# Patient Record
Sex: Female | Born: 1955 | Race: Black or African American | Hispanic: No | Marital: Single | Smoking: Never smoker
Health system: Southern US, Community
[De-identification: ages and names within clinical notes are randomized; demographics above are authoritative.]

## PROBLEM LIST (undated history)

## (undated) DIAGNOSIS — Z992 Dependence on renal dialysis: Secondary | ICD-10-CM

## (undated) DIAGNOSIS — I5022 Chronic systolic (congestive) heart failure: Secondary | ICD-10-CM

## (undated) DIAGNOSIS — I471 Supraventricular tachycardia, unspecified: Secondary | ICD-10-CM

## (undated) DIAGNOSIS — I2510 Atherosclerotic heart disease of native coronary artery without angina pectoris: Secondary | ICD-10-CM

## (undated) DIAGNOSIS — J449 Chronic obstructive pulmonary disease, unspecified: Secondary | ICD-10-CM

## (undated) DIAGNOSIS — N186 End stage renal disease: Secondary | ICD-10-CM

## (undated) DIAGNOSIS — H9190 Unspecified hearing loss, unspecified ear: Secondary | ICD-10-CM

## (undated) DIAGNOSIS — I340 Nonrheumatic mitral (valve) insufficiency: Secondary | ICD-10-CM

## (undated) DIAGNOSIS — D638 Anemia in other chronic diseases classified elsewhere: Secondary | ICD-10-CM

## (undated) DIAGNOSIS — I10 Essential (primary) hypertension: Secondary | ICD-10-CM

## (undated) DIAGNOSIS — N2581 Secondary hyperparathyroidism of renal origin: Secondary | ICD-10-CM

## (undated) DIAGNOSIS — B1920 Unspecified viral hepatitis C without hepatic coma: Secondary | ICD-10-CM

## (undated) DIAGNOSIS — R4189 Other symptoms and signs involving cognitive functions and awareness: Secondary | ICD-10-CM

## (undated) DIAGNOSIS — G43109 Migraine with aura, not intractable, without status migrainosus: Secondary | ICD-10-CM

## (undated) DIAGNOSIS — K219 Gastro-esophageal reflux disease without esophagitis: Secondary | ICD-10-CM

## (undated) HISTORY — PX: TUBAL LIGATION: SHX77

## (undated) HISTORY — PX: DIALYSIS FISTULA CREATION: SHX611

## (undated) SURGERY — A/V SHUNT INTERVENTION
Anesthesia: Moderate Sedation | Laterality: Right

---

## 2004-06-07 ENCOUNTER — Other Ambulatory Visit: Payer: Self-pay

## 2004-06-07 ENCOUNTER — Emergency Department: Payer: Self-pay | Admitting: General Practice

## 2004-12-08 ENCOUNTER — Emergency Department: Payer: Self-pay | Admitting: Emergency Medicine

## 2007-03-05 ENCOUNTER — Emergency Department: Payer: Self-pay | Admitting: Emergency Medicine

## 2007-03-05 ENCOUNTER — Other Ambulatory Visit: Payer: Self-pay

## 2007-08-06 ENCOUNTER — Inpatient Hospital Stay: Payer: Self-pay | Admitting: *Deleted

## 2007-08-06 ENCOUNTER — Other Ambulatory Visit: Payer: Self-pay

## 2007-11-15 ENCOUNTER — Ambulatory Visit: Payer: Self-pay | Admitting: Vascular Surgery

## 2007-11-22 ENCOUNTER — Ambulatory Visit: Payer: Self-pay | Admitting: Vascular Surgery

## 2008-05-26 ENCOUNTER — Ambulatory Visit: Payer: Self-pay | Admitting: Nephrology

## 2008-05-28 ENCOUNTER — Ambulatory Visit: Payer: Self-pay | Admitting: Vascular Surgery

## 2008-06-09 ENCOUNTER — Ambulatory Visit: Payer: Self-pay | Admitting: Nephrology

## 2008-06-10 ENCOUNTER — Inpatient Hospital Stay: Payer: Self-pay | Admitting: Internal Medicine

## 2008-08-03 ENCOUNTER — Ambulatory Visit: Payer: Self-pay | Admitting: Vascular Surgery

## 2008-08-04 ENCOUNTER — Emergency Department: Payer: Self-pay | Admitting: Emergency Medicine

## 2008-08-19 ENCOUNTER — Ambulatory Visit: Payer: Self-pay | Admitting: Internal Medicine

## 2008-08-28 ENCOUNTER — Inpatient Hospital Stay: Payer: Self-pay | Admitting: Internal Medicine

## 2008-09-19 ENCOUNTER — Ambulatory Visit: Payer: Self-pay | Admitting: Internal Medicine

## 2008-10-12 ENCOUNTER — Ambulatory Visit: Payer: Self-pay | Admitting: Vascular Surgery

## 2008-10-15 ENCOUNTER — Ambulatory Visit: Payer: Self-pay | Admitting: Internal Medicine

## 2008-10-19 ENCOUNTER — Ambulatory Visit: Payer: Self-pay | Admitting: Internal Medicine

## 2008-11-10 ENCOUNTER — Inpatient Hospital Stay: Payer: Self-pay | Admitting: Student

## 2008-11-12 ENCOUNTER — Ambulatory Visit: Payer: Self-pay | Admitting: Internal Medicine

## 2008-11-19 ENCOUNTER — Ambulatory Visit: Payer: Self-pay | Admitting: Internal Medicine

## 2008-12-03 ENCOUNTER — Ambulatory Visit: Payer: Self-pay | Admitting: Internal Medicine

## 2008-12-07 ENCOUNTER — Ambulatory Visit: Payer: Self-pay | Admitting: Vascular Surgery

## 2008-12-19 ENCOUNTER — Ambulatory Visit: Payer: Self-pay | Admitting: Internal Medicine

## 2009-01-17 ENCOUNTER — Emergency Department: Payer: Self-pay | Admitting: Emergency Medicine

## 2009-02-19 ENCOUNTER — Ambulatory Visit: Payer: Self-pay | Admitting: Internal Medicine

## 2009-02-21 ENCOUNTER — Ambulatory Visit: Payer: Self-pay | Admitting: Nephrology

## 2009-03-08 ENCOUNTER — Ambulatory Visit: Payer: Self-pay | Admitting: Internal Medicine

## 2009-03-12 ENCOUNTER — Ambulatory Visit: Payer: Self-pay | Admitting: Internal Medicine

## 2009-03-16 ENCOUNTER — Inpatient Hospital Stay: Payer: Self-pay | Admitting: Internal Medicine

## 2009-03-19 ENCOUNTER — Ambulatory Visit: Payer: Self-pay | Admitting: Internal Medicine

## 2009-04-08 ENCOUNTER — Ambulatory Visit: Payer: Self-pay | Admitting: Internal Medicine

## 2009-04-27 ENCOUNTER — Emergency Department: Payer: Self-pay | Admitting: Internal Medicine

## 2009-05-19 ENCOUNTER — Ambulatory Visit: Payer: Self-pay | Admitting: Internal Medicine

## 2009-05-21 ENCOUNTER — Ambulatory Visit: Payer: Self-pay | Admitting: Internal Medicine

## 2009-06-19 ENCOUNTER — Ambulatory Visit: Payer: Self-pay | Admitting: Internal Medicine

## 2009-07-19 ENCOUNTER — Ambulatory Visit: Payer: Self-pay | Admitting: Internal Medicine

## 2009-10-08 ENCOUNTER — Inpatient Hospital Stay: Payer: Self-pay | Admitting: Specialist

## 2010-04-09 ENCOUNTER — Ambulatory Visit: Payer: Self-pay | Admitting: Vascular Surgery

## 2010-08-29 ENCOUNTER — Emergency Department: Payer: Self-pay | Admitting: Unknown Physician Specialty

## 2010-11-04 ENCOUNTER — Ambulatory Visit: Payer: Self-pay | Admitting: Vascular Surgery

## 2011-01-23 ENCOUNTER — Ambulatory Visit: Payer: Self-pay | Admitting: Vascular Surgery

## 2011-01-23 LAB — POTASSIUM: Potassium: 4.3 mmol/L (ref 3.5–5.1)

## 2011-03-02 ENCOUNTER — Ambulatory Visit: Payer: Self-pay | Admitting: Vascular Surgery

## 2011-03-02 LAB — POTASSIUM: Potassium: 4.6 mmol/L (ref 3.5–5.1)

## 2011-04-24 ENCOUNTER — Ambulatory Visit: Payer: Self-pay | Admitting: Vascular Surgery

## 2011-04-27 ENCOUNTER — Ambulatory Visit: Payer: Self-pay | Admitting: Vascular Surgery

## 2011-04-29 ENCOUNTER — Ambulatory Visit: Payer: Self-pay | Admitting: Vascular Surgery

## 2011-04-29 LAB — POTASSIUM: Potassium: 5.6 mmol/L — ABNORMAL HIGH (ref 3.5–5.1)

## 2011-05-01 LAB — PATHOLOGY REPORT

## 2011-07-31 ENCOUNTER — Inpatient Hospital Stay: Payer: Self-pay | Admitting: Internal Medicine

## 2011-07-31 LAB — COMPREHENSIVE METABOLIC PANEL
Alkaline Phosphatase: 160 U/L — ABNORMAL HIGH (ref 50–136)
BUN: 49 mg/dL — ABNORMAL HIGH (ref 7–18)
Bilirubin,Total: 0.5 mg/dL (ref 0.2–1.0)
Co2: 25 mmol/L (ref 21–32)
Creatinine: 7.82 mg/dL — ABNORMAL HIGH (ref 0.60–1.30)
EGFR (Non-African Amer.): 5 — ABNORMAL LOW
Glucose: 81 mg/dL (ref 65–99)
Osmolality: 284 (ref 275–301)
SGOT(AST): 59 U/L — ABNORMAL HIGH (ref 15–37)
SGPT (ALT): 35 U/L
Sodium: 136 mmol/L (ref 136–145)
Total Protein: 8 g/dL (ref 6.4–8.2)

## 2011-07-31 LAB — URINALYSIS, COMPLETE
Bacteria: NONE SEEN
Blood: NEGATIVE
Glucose,UR: NEGATIVE mg/dL (ref 0–75)
Leukocyte Esterase: NEGATIVE
Nitrite: NEGATIVE
Protein: 100
Specific Gravity: 1.011 (ref 1.003–1.030)
Squamous Epithelial: 1
WBC UR: <1 /HPF (ref 0–5)

## 2011-07-31 LAB — CBC
HGB: 10.8 g/dL — ABNORMAL LOW (ref 12.0–16.0)
MCV: 81 fL (ref 80–100)
Platelet: 339 10*3/uL (ref 150–440)
RBC: 4.22 10*6/uL (ref 3.80–5.20)
RDW: 17.9 % — ABNORMAL HIGH (ref 11.5–14.5)
WBC: 20.8 10*3/uL — ABNORMAL HIGH (ref 3.6–11.0)

## 2011-07-31 LAB — CK-MB: CK-MB: <0.5 ng/mL — ABNORMAL LOW (ref 0.5–3.6)

## 2011-08-01 LAB — CK-MB
CK-MB: <0.5 ng/mL — ABNORMAL LOW (ref 0.5–3.6)
CK-MB: <0.5 ng/mL — ABNORMAL LOW (ref 0.5–3.6)

## 2011-08-01 LAB — HEPATIC FUNCTION PANEL A (ARMC)
Albumin: 2.9 g/dL — ABNORMAL LOW (ref 3.4–5.0)
Alkaline Phosphatase: 127 U/L (ref 50–136)
Bilirubin,Total: 0.6 mg/dL (ref 0.2–1.0)
SGOT(AST): 43 U/L — ABNORMAL HIGH (ref 15–37)
SGPT (ALT): 38 U/L
Total Protein: 7.3 g/dL (ref 6.4–8.2)

## 2011-08-01 LAB — BASIC METABOLIC PANEL
Anion Gap: 12 (ref 7–16)
BUN: 55 mg/dL — ABNORMAL HIGH (ref 7–18)
Chloride: 98 mmol/L (ref 98–107)
Co2: 28 mmol/L (ref 21–32)
Creatinine: 9.51 mg/dL — ABNORMAL HIGH (ref 0.60–1.30)
EGFR (African American): 5 — ABNORMAL LOW
EGFR (Non-African Amer.): 4 — ABNORMAL LOW
Glucose: 80 mg/dL (ref 65–99)

## 2011-08-01 LAB — CBC WITH DIFFERENTIAL/PLATELET
Basophil %: 0.4 %
Eosinophil %: 0.1 %
HCT: 32.8 % — ABNORMAL LOW (ref 35.0–47.0)
HGB: 10.6 g/dL — ABNORMAL LOW (ref 12.0–16.0)
Lymphocyte %: 3.9 %
MCHC: 32.3 g/dL (ref 32.0–36.0)
Monocyte #: 1.5 x10 3/mm — ABNORMAL HIGH (ref 0.2–0.9)
Monocyte %: 7.7 %
Neutrophil #: 17.5 10*3/uL — ABNORMAL HIGH (ref 1.4–6.5)
Neutrophil %: 87.9 %
RBC: 4.06 10*6/uL (ref 3.80–5.20)
RDW: 18 % — ABNORMAL HIGH (ref 11.5–14.5)
WBC: 19.9 10*3/uL — ABNORMAL HIGH (ref 3.6–11.0)

## 2011-08-01 LAB — PHOSPHORUS: Phosphorus: 4.1 mg/dL (ref 2.5–4.9)

## 2011-08-01 LAB — TROPONIN I: Troponin-I: <0.02 ng/mL

## 2011-08-02 LAB — URINE CULTURE

## 2011-08-02 LAB — CBC WITH DIFFERENTIAL/PLATELET
Eosinophil #: 0.3 10*3/uL (ref 0.0–0.7)
Eosinophil %: 4.1 %
Lymphocyte %: 12.4 %
MCV: 81 fL (ref 80–100)
Monocyte #: 1.3 x10 3/mm — ABNORMAL HIGH (ref 0.2–0.9)
Monocyte %: 15.8 %
Neutrophil %: 67 %
Platelet: 253 10*3/uL (ref 150–440)
RBC: 3.74 10*6/uL — ABNORMAL LOW (ref 3.80–5.20)
RDW: 18 % — ABNORMAL HIGH (ref 11.5–14.5)
WBC: 8.1 10*3/uL (ref 3.6–11.0)

## 2011-08-04 LAB — WOUND AEROBIC CULTURE

## 2011-08-05 LAB — RENAL FUNCTION PANEL
Albumin: 2.8 g/dL — ABNORMAL LOW (ref 3.4–5.0)
BUN: 65 mg/dL — ABNORMAL HIGH (ref 7–18)
Calcium, Total: 8.1 mg/dL — ABNORMAL LOW (ref 8.5–10.1)
Chloride: 98 mmol/L (ref 98–107)
EGFR (Non-African Amer.): 3 — ABNORMAL LOW
Potassium: 5.6 mmol/L — ABNORMAL HIGH (ref 3.5–5.1)

## 2011-08-06 LAB — PHOSPHORUS: Phosphorus: 5.3 mg/dL — ABNORMAL HIGH (ref 2.5–4.9)

## 2011-08-15 LAB — CULTURE, BLOOD (SINGLE)

## 2011-08-24 ENCOUNTER — Ambulatory Visit: Payer: Self-pay | Admitting: Vascular Surgery

## 2011-08-24 LAB — CBC
HGB: 10.6 g/dL — ABNORMAL LOW (ref 12.0–16.0)
MCH: 27.8 pg (ref 26.0–34.0)
MCHC: 33 g/dL (ref 32.0–36.0)
RBC: 3.82 10*6/uL (ref 3.80–5.20)
RDW: 21 % — ABNORMAL HIGH (ref 11.5–14.5)
WBC: 7.5 10*3/uL (ref 3.6–11.0)

## 2011-08-24 LAB — BASIC METABOLIC PANEL
Anion Gap: 15 (ref 7–16)
Calcium, Total: 8.4 mg/dL — ABNORMAL LOW (ref 8.5–10.1)
Chloride: 102 mmol/L (ref 98–107)
Co2: 24 mmol/L (ref 21–32)
Creatinine: 10.81 mg/dL — ABNORMAL HIGH (ref 0.60–1.30)
Potassium: 4.3 mmol/L (ref 3.5–5.1)
Sodium: 141 mmol/L (ref 136–145)

## 2011-09-02 ENCOUNTER — Ambulatory Visit: Payer: Self-pay | Admitting: Vascular Surgery

## 2011-09-04 LAB — PATHOLOGY REPORT

## 2011-10-02 ENCOUNTER — Ambulatory Visit: Payer: Self-pay | Admitting: Vascular Surgery

## 2011-10-24 ENCOUNTER — Emergency Department: Payer: Self-pay | Admitting: Emergency Medicine

## 2011-10-24 LAB — CBC WITH DIFFERENTIAL/PLATELET
Basophil %: 1 %
Eosinophil #: 0.4 10*3/uL (ref 0.0–0.7)
Eosinophil %: 5 %
HCT: 32.5 % — ABNORMAL LOW (ref 35.0–47.0)
HGB: 10.9 g/dL — ABNORMAL LOW (ref 12.0–16.0)
Lymphocyte #: 1 10*3/uL (ref 1.0–3.6)
MCH: 27.6 pg (ref 26.0–34.0)
MCHC: 33.4 g/dL (ref 32.0–36.0)
MCV: 83 fL (ref 80–100)
Monocyte #: 0.5 x10 3/mm (ref 0.2–0.9)
Monocyte %: 6.8 %
Neutrophil #: 5.6 10*3/uL (ref 1.4–6.5)
Neutrophil %: 73.6 %
Platelet: 429 10*3/uL (ref 150–440)
RBC: 3.93 10*6/uL (ref 3.80–5.20)

## 2011-10-24 LAB — BASIC METABOLIC PANEL
Anion Gap: 11 (ref 7–16)
BUN: 27 mg/dL — ABNORMAL HIGH (ref 7–18)
Chloride: 97 mmol/L — ABNORMAL LOW (ref 98–107)
Creatinine: 7.63 mg/dL — ABNORMAL HIGH (ref 0.60–1.30)
Glucose: 107 mg/dL — ABNORMAL HIGH (ref 65–99)
Osmolality: 276 (ref 275–301)

## 2011-11-04 ENCOUNTER — Emergency Department: Payer: Self-pay | Admitting: Emergency Medicine

## 2011-11-04 ENCOUNTER — Ambulatory Visit: Payer: Self-pay | Admitting: Physician Assistant

## 2011-11-04 LAB — CBC WITH DIFFERENTIAL/PLATELET
Eosinophil #: 0.4 10*3/uL (ref 0.0–0.7)
Eosinophil %: 3.4 %
Lymphocyte #: 1.1 10*3/uL (ref 1.0–3.6)
Lymphocyte %: 10.2 %
MCHC: 32.3 g/dL (ref 32.0–36.0)
MCV: 84 fL (ref 80–100)
Monocyte %: 9.5 %
Platelet: 341 10*3/uL (ref 150–440)
RBC: 4.22 10*6/uL (ref 3.80–5.20)
RDW: 19.4 % — ABNORMAL HIGH (ref 11.5–14.5)
WBC: 11.1 10*3/uL — ABNORMAL HIGH (ref 3.6–11.0)

## 2011-11-04 LAB — BASIC METABOLIC PANEL
Anion Gap: 12 (ref 7–16)
Calcium, Total: 7.6 mg/dL — ABNORMAL LOW (ref 8.5–10.1)
Co2: 25 mmol/L (ref 21–32)
Creatinine: 10.61 mg/dL — ABNORMAL HIGH (ref 0.60–1.30)
EGFR (African American): 4 — ABNORMAL LOW
EGFR (Non-African Amer.): 4 — ABNORMAL LOW
Potassium: 4.7 mmol/L (ref 3.5–5.1)
Sodium: 139 mmol/L (ref 136–145)

## 2011-11-10 ENCOUNTER — Emergency Department: Payer: Self-pay | Admitting: Emergency Medicine

## 2011-11-10 LAB — CBC
HCT: 31.3 % — ABNORMAL LOW
HGB: 10.1 g/dL — ABNORMAL LOW
MCH: 26.4 pg
MCHC: 32.2 g/dL
MCV: 82 fL
Platelet: 434 10*3/uL
RBC: 3.81 X10 6/mm 3
RDW: 18.2 % — ABNORMAL HIGH
WBC: 12.1 10*3/uL — ABNORMAL HIGH

## 2011-11-10 LAB — PRO B NATRIURETIC PEPTIDE: B-Type Natriuretic Peptide: 1873 pg/mL — ABNORMAL HIGH (ref 0–125)

## 2011-11-10 LAB — COMPREHENSIVE METABOLIC PANEL
Albumin: 3.1 g/dL — ABNORMAL LOW (ref 3.4–5.0)
Alkaline Phosphatase: 199 U/L — ABNORMAL HIGH (ref 50–136)
BUN: 53 mg/dL — ABNORMAL HIGH (ref 7–18)
Calcium, Total: 7.7 mg/dL — ABNORMAL LOW (ref 8.5–10.1)
EGFR (Non-African Amer.): 3 — ABNORMAL LOW
Glucose: 91 mg/dL (ref 65–99)
SGOT(AST): 24 U/L (ref 15–37)
SGPT (ALT): 15 U/L (ref 12–78)
Sodium: 139 mmol/L (ref 136–145)

## 2011-11-10 LAB — CULTURE, BLOOD (SINGLE)

## 2011-11-10 LAB — TROPONIN I: Troponin-I: <0.02 ng/mL

## 2012-02-15 ENCOUNTER — Ambulatory Visit: Payer: Self-pay | Admitting: Vascular Surgery

## 2012-07-19 ENCOUNTER — Emergency Department: Payer: Self-pay | Admitting: Emergency Medicine

## 2012-08-13 ENCOUNTER — Emergency Department: Payer: Self-pay | Admitting: Internal Medicine

## 2012-08-13 LAB — COMPREHENSIVE METABOLIC PANEL
Albumin: 3.3 g/dL — ABNORMAL LOW (ref 3.4–5.0)
Anion Gap: 9 (ref 7–16)
Bilirubin,Total: 0.4 mg/dL (ref 0.2–1.0)
Calcium, Total: 7.9 mg/dL — ABNORMAL LOW (ref 8.5–10.1)
Chloride: 99 mmol/L (ref 98–107)
Co2: 28 mmol/L (ref 21–32)
EGFR (African American): 6 — ABNORMAL LOW
EGFR (Non-African Amer.): 6 — ABNORMAL LOW
Glucose: 112 mg/dL — ABNORMAL HIGH (ref 65–99)
Osmolality: 275 (ref 275–301)
Potassium: 3.3 mmol/L — ABNORMAL LOW (ref 3.5–5.1)
SGOT(AST): 22 U/L (ref 15–37)
SGPT (ALT): 14 U/L (ref 12–78)
Total Protein: 7.9 g/dL (ref 6.4–8.2)

## 2012-08-13 LAB — CK TOTAL AND CKMB (NOT AT ARMC): CK, Total: 103 U/L (ref 21–215)

## 2012-08-13 LAB — TROPONIN I: Troponin-I: <0.02 ng/mL

## 2012-08-13 LAB — CBC
MCHC: 33.2 g/dL (ref 32.0–36.0)
RBC: 3.57 10*6/uL — ABNORMAL LOW (ref 3.80–5.20)
RDW: 17.3 % — ABNORMAL HIGH (ref 11.5–14.5)
WBC: 10.3 10*3/uL (ref 3.6–11.0)

## 2012-08-16 ENCOUNTER — Emergency Department: Payer: Self-pay | Admitting: Emergency Medicine

## 2012-08-16 LAB — CBC
HCT: 29 % — ABNORMAL LOW (ref 35.0–47.0)
HGB: 9.9 g/dL — ABNORMAL LOW (ref 12.0–16.0)
MCV: 88 fL (ref 80–100)
RDW: 17.2 % — ABNORMAL HIGH (ref 11.5–14.5)
WBC: 9 10*3/uL (ref 3.6–11.0)

## 2012-08-16 LAB — BASIC METABOLIC PANEL
Co2: 32 mmol/L (ref 21–32)
Creatinine: 7.05 mg/dL — ABNORMAL HIGH (ref 0.60–1.30)
EGFR (African American): 7 — ABNORMAL LOW
EGFR (Non-African Amer.): 6 — ABNORMAL LOW
Glucose: 100 mg/dL — ABNORMAL HIGH (ref 65–99)
Osmolality: 275 (ref 275–301)
Potassium: 3.1 mmol/L — ABNORMAL LOW (ref 3.5–5.1)
Sodium: 137 mmol/L (ref 136–145)

## 2012-09-22 ENCOUNTER — Emergency Department: Payer: Self-pay | Admitting: Emergency Medicine

## 2012-09-22 LAB — COMPREHENSIVE METABOLIC PANEL
Albumin: 3.2 g/dL — ABNORMAL LOW (ref 3.4–5.0)
Anion Gap: 5 — ABNORMAL LOW (ref 7–16)
Co2: 30 mmol/L (ref 21–32)
Creatinine: 5.5 mg/dL — ABNORMAL HIGH (ref 0.60–1.30)
EGFR (Non-African Amer.): 8 — ABNORMAL LOW
Glucose: 96 mg/dL (ref 65–99)
Osmolality: 277 (ref 275–301)
SGOT(AST): 16 U/L (ref 15–37)
Sodium: 139 mmol/L (ref 136–145)
Total Protein: 7.2 g/dL (ref 6.4–8.2)

## 2012-09-22 LAB — CBC
MCH: 28.7 pg (ref 26.0–34.0)
MCV: 85 fL (ref 80–100)
Platelet: 409 10*3/uL (ref 150–440)
RDW: 18.3 % — ABNORMAL HIGH (ref 11.5–14.5)
WBC: 8.5 10*3/uL (ref 3.6–11.0)

## 2012-09-22 LAB — LIPASE, BLOOD: Lipase: 113 U/L (ref 73–393)

## 2012-10-06 ENCOUNTER — Ambulatory Visit: Payer: Self-pay | Admitting: Nephrology

## 2012-12-05 ENCOUNTER — Emergency Department: Payer: Self-pay | Admitting: Emergency Medicine

## 2012-12-06 LAB — CBC WITH DIFFERENTIAL/PLATELET
Eosinophil #: 0.6 10*3/uL (ref 0.0–0.7)
Eosinophil %: 6.6 %
HCT: 28.7 % — ABNORMAL LOW (ref 35.0–47.0)
Lymphocyte #: 1.5 10*3/uL (ref 1.0–3.6)
MCH: 27.2 pg (ref 26.0–34.0)
Monocyte #: 0.6 x10 3/mm (ref 0.2–0.9)
Monocyte %: 6.2 %
Neutrophil #: 6.5 10*3/uL (ref 1.4–6.5)
RBC: 3.61 10*6/uL — ABNORMAL LOW (ref 3.80–5.20)
WBC: 9.2 10*3/uL (ref 3.6–11.0)

## 2012-12-06 LAB — COMPREHENSIVE METABOLIC PANEL
Alkaline Phosphatase: 99 U/L (ref 50–136)
Anion Gap: 12 (ref 7–16)
Bilirubin,Total: 0.3 mg/dL (ref 0.2–1.0)
Calcium, Total: 7.7 mg/dL — ABNORMAL LOW (ref 8.5–10.1)
Chloride: 103 mmol/L (ref 98–107)
Creatinine: 13.37 mg/dL — ABNORMAL HIGH (ref 0.60–1.30)
Glucose: 99 mg/dL (ref 65–99)
Potassium: 4.7 mmol/L (ref 3.5–5.1)
Sodium: 139 mmol/L (ref 136–145)
Total Protein: 7.2 g/dL (ref 6.4–8.2)

## 2012-12-06 LAB — TROPONIN I: Troponin-I: <0.02 ng/mL

## 2012-12-06 LAB — CK TOTAL AND CKMB (NOT AT ARMC)
CK, Total: 70 U/L (ref 21–215)
CK-MB: 0.6 ng/mL (ref 0.5–3.6)

## 2013-03-01 ENCOUNTER — Ambulatory Visit: Payer: Self-pay | Admitting: Vascular Surgery

## 2013-04-27 ENCOUNTER — Emergency Department: Payer: Self-pay | Admitting: Emergency Medicine

## 2013-04-27 LAB — COMPREHENSIVE METABOLIC PANEL
ALBUMIN: 3.5 g/dL (ref 3.4–5.0)
Alkaline Phosphatase: 156 U/L — ABNORMAL HIGH
Anion Gap: 3 — ABNORMAL LOW (ref 7–16)
BUN: 17 mg/dL (ref 7–18)
Bilirubin,Total: 0.4 mg/dL (ref 0.2–1.0)
CHLORIDE: 100 mmol/L (ref 98–107)
CO2: 33 mmol/L — AB (ref 21–32)
Calcium, Total: 7.8 mg/dL — ABNORMAL LOW (ref 8.5–10.1)
Creatinine: 4.88 mg/dL — ABNORMAL HIGH (ref 0.60–1.30)
EGFR (African American): 11 — ABNORMAL LOW
GFR CALC NON AF AMER: 9 — AB
Glucose: 101 mg/dL — ABNORMAL HIGH (ref 65–99)
Osmolality: 274 (ref 275–301)
Potassium: 4.1 mmol/L (ref 3.5–5.1)
SGOT(AST): 20 U/L (ref 15–37)
SGPT (ALT): 18 U/L (ref 12–78)
SODIUM: 136 mmol/L (ref 136–145)
Total Protein: 8.2 g/dL (ref 6.4–8.2)

## 2013-04-27 LAB — CBC WITH DIFFERENTIAL/PLATELET
BASOS PCT: 0.6 %
Basophil #: 0.1 10*3/uL (ref 0.0–0.1)
EOS PCT: 4.2 %
Eosinophil #: 0.3 10*3/uL (ref 0.0–0.7)
HCT: 34.4 % — AB (ref 35.0–47.0)
HGB: 11.5 g/dL — ABNORMAL LOW (ref 12.0–16.0)
LYMPHS ABS: 1.3 10*3/uL (ref 1.0–3.6)
LYMPHS PCT: 16.5 %
MCH: 29.2 pg (ref 26.0–34.0)
MCHC: 33.5 g/dL (ref 32.0–36.0)
MCV: 87 fL (ref 80–100)
Monocyte #: 0.7 x10 3/mm (ref 0.2–0.9)
Monocyte %: 9.2 %
Neutrophil #: 5.5 10*3/uL (ref 1.4–6.5)
Neutrophil %: 69.5 %
Platelet: 380 10*3/uL (ref 150–440)
RBC: 3.94 10*6/uL (ref 3.80–5.20)
RDW: 19.1 % — AB (ref 11.5–14.5)
WBC: 7.9 10*3/uL (ref 3.6–11.0)

## 2013-07-18 ENCOUNTER — Emergency Department: Payer: Self-pay | Admitting: Emergency Medicine

## 2013-07-18 LAB — TROPONIN I
Troponin-I: <0.02 ng/mL
Troponin-I: <0.02 ng/mL

## 2013-07-18 LAB — CBC WITH DIFFERENTIAL/PLATELET
BASOS ABS: 0 10*3/uL (ref 0.0–0.1)
BASOS PCT: 0.4 %
EOS ABS: 0.4 10*3/uL (ref 0.0–0.7)
EOS PCT: 4.1 %
HCT: 35 % (ref 35.0–47.0)
HGB: 11.4 g/dL — ABNORMAL LOW (ref 12.0–16.0)
Lymphocyte #: 0.9 10*3/uL — ABNORMAL LOW (ref 1.0–3.6)
Lymphocyte %: 10.7 %
MCH: 28.6 pg (ref 26.0–34.0)
MCHC: 32.5 g/dL (ref 32.0–36.0)
MCV: 88 fL (ref 80–100)
MONO ABS: 0.5 x10 3/mm (ref 0.2–0.9)
MONOS PCT: 5.5 %
NEUTROS ABS: 6.9 10*3/uL — AB (ref 1.4–6.5)
Neutrophil %: 79.3 %
PLATELETS: 402 10*3/uL (ref 150–440)
RBC: 3.97 10*6/uL (ref 3.80–5.20)
RDW: 19.3 % — ABNORMAL HIGH (ref 11.5–14.5)
WBC: 8.7 10*3/uL (ref 3.6–11.0)

## 2013-07-18 LAB — COMPREHENSIVE METABOLIC PANEL
ALBUMIN: 3.4 g/dL (ref 3.4–5.0)
ALT: 22 U/L (ref 12–78)
Alkaline Phosphatase: 156 U/L — ABNORMAL HIGH
Anion Gap: 6 — ABNORMAL LOW (ref 7–16)
BUN: 21 mg/dL — AB (ref 7–18)
Bilirubin,Total: 0.5 mg/dL (ref 0.2–1.0)
CHLORIDE: 99 mmol/L (ref 98–107)
Calcium, Total: 7.8 mg/dL — ABNORMAL LOW (ref 8.5–10.1)
Co2: 29 mmol/L (ref 21–32)
Creatinine: 6.2 mg/dL — ABNORMAL HIGH (ref 0.60–1.30)
EGFR (African American): 8 — ABNORMAL LOW
GFR CALC NON AF AMER: 7 — AB
GLUCOSE: 111 mg/dL — AB (ref 65–99)
Osmolality: 272 (ref 275–301)
Potassium: 4.6 mmol/L (ref 3.5–5.1)
SGOT(AST): 38 U/L — ABNORMAL HIGH (ref 15–37)
SODIUM: 134 mmol/L — AB (ref 136–145)
Total Protein: 8.2 g/dL (ref 6.4–8.2)

## 2013-08-19 ENCOUNTER — Emergency Department: Payer: Self-pay | Admitting: Emergency Medicine

## 2013-08-19 LAB — PHOSPHORUS: PHOSPHORUS: 5.1 mg/dL — AB (ref 2.5–4.9)

## 2013-08-19 LAB — PROTIME-INR
INR: 1.1
PROTHROMBIN TIME: 13.6 s (ref 11.5–14.7)

## 2013-08-19 LAB — BASIC METABOLIC PANEL
Anion Gap: 9 (ref 7–16)
BUN: 53 mg/dL — ABNORMAL HIGH (ref 7–18)
CREATININE: 9.51 mg/dL — AB (ref 0.60–1.30)
Calcium, Total: 7.5 mg/dL — ABNORMAL LOW (ref 8.5–10.1)
Chloride: 98 mmol/L (ref 98–107)
Co2: 29 mmol/L (ref 21–32)
EGFR (African American): 5 — ABNORMAL LOW
EGFR (Non-African Amer.): 4 — ABNORMAL LOW
Glucose: 90 mg/dL (ref 65–99)
Osmolality: 286 (ref 275–301)
Potassium: 4.9 mmol/L (ref 3.5–5.1)
SODIUM: 136 mmol/L (ref 136–145)

## 2013-08-21 ENCOUNTER — Ambulatory Visit: Payer: Self-pay | Admitting: Vascular Surgery

## 2013-08-23 ENCOUNTER — Ambulatory Visit: Payer: Self-pay | Admitting: Vascular Surgery

## 2013-08-23 LAB — URINALYSIS, COMPLETE
BILIRUBIN, UR: NEGATIVE
Bacteria: NONE SEEN
Blood: NEGATIVE
Glucose,UR: NEGATIVE mg/dL (ref 0–75)
Ketone: NEGATIVE
Nitrite: NEGATIVE
Ph: 8 (ref 4.5–8.0)
Protein: 100
RBC,UR: NONE SEEN /HPF (ref 0–5)
Specific Gravity: 1.01 (ref 1.003–1.030)
Squamous Epithelial: 7

## 2013-08-23 LAB — BASIC METABOLIC PANEL
Anion Gap: 7 (ref 7–16)
BUN: 54 mg/dL — AB (ref 7–18)
CHLORIDE: 105 mmol/L (ref 98–107)
CREATININE: 10.8 mg/dL — AB (ref 0.60–1.30)
Calcium, Total: 7.8 mg/dL — ABNORMAL LOW (ref 8.5–10.1)
Co2: 30 mmol/L (ref 21–32)
EGFR (African American): 4 — ABNORMAL LOW
EGFR (Non-African Amer.): 3 — ABNORMAL LOW
Glucose: 107 mg/dL — ABNORMAL HIGH (ref 65–99)
OSMOLALITY: 298 (ref 275–301)
Potassium: 4.2 mmol/L (ref 3.5–5.1)
SODIUM: 142 mmol/L (ref 136–145)

## 2013-08-23 LAB — CBC
HCT: 32.8 % — ABNORMAL LOW (ref 35.0–47.0)
HGB: 11 g/dL — ABNORMAL LOW (ref 12.0–16.0)
MCH: 29.7 pg (ref 26.0–34.0)
MCHC: 33.5 g/dL (ref 32.0–36.0)
MCV: 89 fL (ref 80–100)
PLATELETS: 301 10*3/uL (ref 150–440)
RBC: 3.7 10*6/uL — ABNORMAL LOW (ref 3.80–5.20)
RDW: 18.9 % — ABNORMAL HIGH (ref 11.5–14.5)
WBC: 7.8 10*3/uL (ref 3.6–11.0)

## 2013-09-13 ENCOUNTER — Ambulatory Visit: Payer: Self-pay | Admitting: Vascular Surgery

## 2013-09-15 LAB — PATHOLOGY REPORT

## 2013-09-23 ENCOUNTER — Emergency Department: Payer: Self-pay | Admitting: Emergency Medicine

## 2013-09-23 ENCOUNTER — Other Ambulatory Visit: Payer: Self-pay | Admitting: Nephrology

## 2013-09-24 LAB — CBC
HCT: 26.4 % — AB (ref 35.0–47.0)
HGB: 8.7 g/dL — ABNORMAL LOW (ref 12.0–16.0)
MCH: 29.8 pg (ref 26.0–34.0)
MCHC: 33 g/dL (ref 32.0–36.0)
MCV: 90 fL (ref 80–100)
Platelet: 344 10*3/uL (ref 150–440)
RBC: 2.93 10*6/uL — ABNORMAL LOW (ref 3.80–5.20)
RDW: 18.7 % — ABNORMAL HIGH (ref 11.5–14.5)
WBC: 10.2 10*3/uL (ref 3.6–11.0)

## 2013-09-24 LAB — BASIC METABOLIC PANEL
Anion Gap: 10 (ref 7–16)
BUN: 21 mg/dL — ABNORMAL HIGH (ref 7–18)
CALCIUM: 7.3 mg/dL — AB (ref 8.5–10.1)
CO2: 26 mmol/L (ref 21–32)
Chloride: 101 mmol/L (ref 98–107)
Creatinine: 6.16 mg/dL — ABNORMAL HIGH (ref 0.60–1.30)
EGFR (African American): 8 — ABNORMAL LOW
GFR CALC NON AF AMER: 7 — AB
Glucose: 95 mg/dL (ref 65–99)
Osmolality: 277 (ref 275–301)
POTASSIUM: 4.4 mmol/L (ref 3.5–5.1)
Sodium: 137 mmol/L (ref 136–145)

## 2013-09-24 LAB — TROPONIN I: Troponin-I: <0.02 ng/mL

## 2013-09-27 LAB — WOUND CULTURE

## 2013-09-28 LAB — CULTURE, BLOOD (SINGLE)

## 2013-10-03 ENCOUNTER — Emergency Department: Payer: Self-pay | Admitting: Emergency Medicine

## 2013-10-03 LAB — CBC
HCT: 28.5 % — ABNORMAL LOW (ref 35.0–47.0)
HGB: 9.1 g/dL — ABNORMAL LOW (ref 12.0–16.0)
MCH: 29.3 pg (ref 26.0–34.0)
MCHC: 32.1 g/dL (ref 32.0–36.0)
MCV: 91 fL (ref 80–100)
Platelet: 493 10*3/uL — ABNORMAL HIGH (ref 150–440)
RBC: 3.12 10*6/uL — AB (ref 3.80–5.20)
RDW: 19.2 % — AB (ref 11.5–14.5)
WBC: 9.3 10*3/uL (ref 3.6–11.0)

## 2013-10-03 LAB — BASIC METABOLIC PANEL
ANION GAP: 10 (ref 7–16)
BUN: 11 mg/dL (ref 7–18)
CHLORIDE: 102 mmol/L (ref 98–107)
Calcium, Total: 7.3 mg/dL — ABNORMAL LOW (ref 8.5–10.1)
Co2: 29 mmol/L (ref 21–32)
Creatinine: 4.34 mg/dL — ABNORMAL HIGH (ref 0.60–1.30)
EGFR (Non-African Amer.): 10 — ABNORMAL LOW
GFR CALC AF AMER: 12 — AB
Glucose: 106 mg/dL — ABNORMAL HIGH (ref 65–99)
OSMOLALITY: 281 (ref 275–301)
Potassium: 4.7 mmol/L (ref 3.5–5.1)
SODIUM: 141 mmol/L (ref 136–145)

## 2013-10-03 LAB — TROPONIN I

## 2013-10-05 ENCOUNTER — Emergency Department: Payer: Self-pay | Admitting: Internal Medicine

## 2013-10-05 LAB — BASIC METABOLIC PANEL
Anion Gap: 8 (ref 7–16)
BUN: 9 mg/dL (ref 7–18)
CALCIUM: 7.9 mg/dL — AB (ref 8.5–10.1)
CHLORIDE: 99 mmol/L (ref 98–107)
CREATININE: 3.39 mg/dL — AB (ref 0.60–1.30)
Co2: 27 mmol/L (ref 21–32)
GFR CALC AF AMER: 16 — AB
GFR CALC NON AF AMER: 14 — AB
GLUCOSE: 86 mg/dL (ref 65–99)
OSMOLALITY: 266 (ref 275–301)
POTASSIUM: 3.9 mmol/L (ref 3.5–5.1)
Sodium: 134 mmol/L — ABNORMAL LOW (ref 136–145)

## 2013-10-05 LAB — CBC
HCT: 27.1 % — ABNORMAL LOW (ref 35.0–47.0)
HGB: 8.8 g/dL — ABNORMAL LOW (ref 12.0–16.0)
MCH: 29.2 pg (ref 26.0–34.0)
MCHC: 32.4 g/dL (ref 32.0–36.0)
MCV: 90 fL (ref 80–100)
Platelet: 389 10*3/uL (ref 150–440)
RBC: 3.01 10*6/uL — ABNORMAL LOW (ref 3.80–5.20)
RDW: 18.8 % — AB (ref 11.5–14.5)
WBC: 7.1 10*3/uL (ref 3.6–11.0)

## 2013-10-05 LAB — TROPONIN I: Troponin-I: <0.02 ng/mL

## 2013-10-10 LAB — CULTURE, BLOOD (SINGLE)

## 2013-10-12 ENCOUNTER — Emergency Department: Payer: Self-pay | Admitting: Emergency Medicine

## 2013-10-12 LAB — CBC WITH DIFFERENTIAL/PLATELET
BASOS PCT: 0.8 %
Basophil #: 0.1 10*3/uL (ref 0.0–0.1)
EOS ABS: 0.3 10*3/uL (ref 0.0–0.7)
Eosinophil %: 3.6 %
HCT: 28.7 % — ABNORMAL LOW (ref 35.0–47.0)
HGB: 9.5 g/dL — AB (ref 12.0–16.0)
LYMPHS ABS: 1.1 10*3/uL (ref 1.0–3.6)
Lymphocyte %: 13.7 %
MCH: 29.8 pg (ref 26.0–34.0)
MCHC: 33.2 g/dL (ref 32.0–36.0)
MCV: 90 fL (ref 80–100)
MONOS PCT: 9.7 %
Monocyte #: 0.8 x10 3/mm (ref 0.2–0.9)
NEUTROS PCT: 72.2 %
Neutrophil #: 5.7 10*3/uL (ref 1.4–6.5)
Platelet: 359 10*3/uL (ref 150–440)
RBC: 3.2 10*6/uL — ABNORMAL LOW (ref 3.80–5.20)
RDW: 18.3 % — AB (ref 11.5–14.5)
WBC: 7.9 10*3/uL (ref 3.6–11.0)

## 2013-10-12 LAB — COMPREHENSIVE METABOLIC PANEL
ALK PHOS: 155 U/L — AB
ANION GAP: 8 (ref 7–16)
Albumin: 3 g/dL — ABNORMAL LOW (ref 3.4–5.0)
BILIRUBIN TOTAL: 0.3 mg/dL (ref 0.2–1.0)
BUN: 16 mg/dL (ref 7–18)
CHLORIDE: 102 mmol/L (ref 98–107)
CREATININE: 4.85 mg/dL — AB (ref 0.60–1.30)
Calcium, Total: 7.4 mg/dL — ABNORMAL LOW (ref 8.5–10.1)
Co2: 30 mmol/L (ref 21–32)
EGFR (Non-African Amer.): 10 — ABNORMAL LOW
GFR CALC AF AMER: 12 — AB
Glucose: 95 mg/dL (ref 65–99)
OSMOLALITY: 280 (ref 275–301)
Potassium: 3.8 mmol/L (ref 3.5–5.1)
SGOT(AST): 25 U/L (ref 15–37)
SGPT (ALT): 15 U/L
SODIUM: 140 mmol/L (ref 136–145)
Total Protein: 7.5 g/dL (ref 6.4–8.2)

## 2013-10-12 LAB — TSH: Thyroid Stimulating Horm: 4.72 u[IU]/mL — ABNORMAL HIGH

## 2013-10-12 LAB — RAPID INFLUENZA A&B ANTIGENS (ARMC ONLY)

## 2013-10-12 LAB — TROPONIN I: Troponin-I: <0.02 ng/mL

## 2013-10-12 LAB — PRO B NATRIURETIC PEPTIDE: B-Type Natriuretic Peptide: 11436 pg/mL — ABNORMAL HIGH (ref 0–125)

## 2014-01-11 ENCOUNTER — Inpatient Hospital Stay: Payer: Self-pay | Admitting: Internal Medicine

## 2014-01-11 LAB — COMPREHENSIVE METABOLIC PANEL
ALBUMIN: 2.7 g/dL — AB (ref 3.4–5.0)
ALK PHOS: 144 U/L — AB
ALT: 39 U/L
ANION GAP: 16 (ref 7–16)
BUN: 96 mg/dL — ABNORMAL HIGH (ref 7–18)
Bilirubin,Total: 0.9 mg/dL (ref 0.2–1.0)
CHLORIDE: 100 mmol/L (ref 98–107)
Calcium, Total: 8.2 mg/dL — ABNORMAL LOW (ref 8.5–10.1)
Co2: 20 mmol/L — ABNORMAL LOW (ref 21–32)
Creatinine: 16.75 mg/dL — ABNORMAL HIGH (ref 0.60–1.30)
EGFR (African American): 3 — ABNORMAL LOW
EGFR (Non-African Amer.): 2 — ABNORMAL LOW
GLUCOSE: 113 mg/dL — AB (ref 65–99)
Osmolality: 303 (ref 275–301)
Potassium: 4.8 mmol/L (ref 3.5–5.1)
SGOT(AST): 63 U/L — ABNORMAL HIGH (ref 15–37)
SODIUM: 136 mmol/L (ref 136–145)
Total Protein: 7.4 g/dL (ref 6.4–8.2)

## 2014-01-11 LAB — CBC
HCT: 31.9 % — ABNORMAL LOW (ref 35.0–47.0)
HGB: 10.2 g/dL — AB (ref 12.0–16.0)
MCH: 25.6 pg — AB (ref 26.0–34.0)
MCHC: 32 g/dL (ref 32.0–36.0)
MCV: 80 fL (ref 80–100)
PLATELETS: 211 10*3/uL (ref 150–440)
RBC: 3.98 10*6/uL (ref 3.80–5.20)
RDW: 19.2 % — AB (ref 11.5–14.5)
WBC: 13.9 10*3/uL — ABNORMAL HIGH (ref 3.6–11.0)

## 2014-01-11 LAB — CK-MB: CK-MB: <0.5 ng/mL — ABNORMAL LOW (ref 0.5–3.6)

## 2014-01-11 LAB — LIPASE, BLOOD: Lipase: 120 U/L (ref 73–393)

## 2014-01-11 LAB — TROPONIN I: Troponin-I: 0.07 ng/mL — ABNORMAL HIGH

## 2014-01-12 LAB — CBC WITH DIFFERENTIAL/PLATELET
Basophil #: 0 10*3/uL (ref 0.0–0.1)
Basophil %: 0.3 %
Eosinophil #: 0 10*3/uL (ref 0.0–0.7)
Eosinophil %: 0.1 %
HCT: 27.4 % — ABNORMAL LOW (ref 35.0–47.0)
HGB: 8.8 g/dL — ABNORMAL LOW (ref 12.0–16.0)
Lymphocyte #: 0.6 10*3/uL — ABNORMAL LOW (ref 1.0–3.6)
Lymphocyte %: 4.7 %
MCH: 25.5 pg — ABNORMAL LOW (ref 26.0–34.0)
MCHC: 32.1 g/dL (ref 32.0–36.0)
MCV: 79 fL — ABNORMAL LOW (ref 80–100)
Monocyte #: 2 x10 3/mm — ABNORMAL HIGH (ref 0.2–0.9)
Monocyte %: 14.7 %
Neutrophil #: 10.7 10*3/uL — ABNORMAL HIGH (ref 1.4–6.5)
Neutrophil %: 80.2 %
Platelet: 179 10*3/uL (ref 150–440)
RBC: 3.46 10*6/uL — ABNORMAL LOW (ref 3.80–5.20)
RDW: 18.8 % — ABNORMAL HIGH (ref 11.5–14.5)
WBC: 13.3 10*3/uL — ABNORMAL HIGH (ref 3.6–11.0)

## 2014-01-12 LAB — TSH: Thyroid Stimulating Horm: 2.27 u[IU]/mL

## 2014-01-12 LAB — HEMOGLOBIN A1C: Hemoglobin A1C: 5.4 % (ref 4.2–6.3)

## 2014-01-12 LAB — CK-MB
CK-MB: <0.5 ng/mL — ABNORMAL LOW (ref 0.5–3.6)
CK-MB: <0.5 ng/mL — ABNORMAL LOW (ref 0.5–3.6)

## 2014-01-12 LAB — TROPONIN I
Troponin-I: 0.08 ng/mL — ABNORMAL HIGH
Troponin-I: 0.06 ng/mL — ABNORMAL HIGH

## 2014-01-12 LAB — PHOSPHORUS: PHOSPHORUS: 4.4 mg/dL (ref 2.5–4.9)

## 2014-01-14 LAB — PHOSPHORUS: Phosphorus: 3 mg/dL (ref 2.5–4.9)

## 2014-01-15 LAB — CBC WITH DIFFERENTIAL/PLATELET
Bands: 7 %
Eosinophil: 2 %
HCT: 25.1 % — AB (ref 35.0–47.0)
HGB: 8.2 g/dL — AB (ref 12.0–16.0)
LYMPHS PCT: 8 %
MCH: 25.4 pg — ABNORMAL LOW (ref 26.0–34.0)
MCHC: 32.7 g/dL (ref 32.0–36.0)
MCV: 78 fL — ABNORMAL LOW (ref 80–100)
Metamyelocyte: 1 %
Monocytes: 13 %
Myelocyte: 1 %
NRBC/100 WBC: 1 /
Platelet: 186 10*3/uL (ref 150–440)
RBC: 3.24 10*6/uL — ABNORMAL LOW (ref 3.80–5.20)
RDW: 19.5 % — ABNORMAL HIGH (ref 11.5–14.5)
Segmented Neutrophils: 68 %
WBC: 32.1 10*3/uL — ABNORMAL HIGH (ref 3.6–11.0)

## 2014-01-15 LAB — COMPREHENSIVE METABOLIC PANEL
ALK PHOS: 127 U/L — AB
ALT: 21 U/L
Albumin: 1.8 g/dL — ABNORMAL LOW (ref 3.4–5.0)
Anion Gap: 10 (ref 7–16)
BILIRUBIN TOTAL: 1.3 mg/dL — AB (ref 0.2–1.0)
BUN: 40 mg/dL — AB (ref 7–18)
CALCIUM: 8.2 mg/dL — AB (ref 8.5–10.1)
CO2: 27 mmol/L (ref 21–32)
Chloride: 101 mmol/L (ref 98–107)
Creatinine: 7.19 mg/dL — ABNORMAL HIGH (ref 0.60–1.30)
EGFR (African American): 7 — ABNORMAL LOW
EGFR (Non-African Amer.): 6 — ABNORMAL LOW
GLUCOSE: 122 mg/dL — AB (ref 65–99)
OSMOLALITY: 287 (ref 275–301)
POTASSIUM: 4.1 mmol/L (ref 3.5–5.1)
SGOT(AST): 33 U/L (ref 15–37)
Sodium: 138 mmol/L (ref 136–145)
TOTAL PROTEIN: 6.2 g/dL — AB (ref 6.4–8.2)

## 2014-01-16 LAB — PHOSPHORUS: PHOSPHORUS: 3 mg/dL (ref 2.5–4.9)

## 2014-01-17 LAB — BASIC METABOLIC PANEL
ANION GAP: 8 (ref 7–16)
BUN: 34 mg/dL — ABNORMAL HIGH (ref 7–18)
CO2: 29 mmol/L (ref 21–32)
CREATININE: 5.63 mg/dL — AB (ref 0.60–1.30)
Calcium, Total: 8.2 mg/dL — ABNORMAL LOW (ref 8.5–10.1)
Chloride: 101 mmol/L (ref 98–107)
EGFR (African American): 10 — ABNORMAL LOW
GFR CALC NON AF AMER: 8 — AB
Glucose: 115 mg/dL — ABNORMAL HIGH (ref 65–99)
Osmolality: 284 (ref 275–301)
POTASSIUM: 4 mmol/L (ref 3.5–5.1)
SODIUM: 138 mmol/L (ref 136–145)

## 2014-01-17 LAB — CBC WITH DIFFERENTIAL/PLATELET
BANDS NEUTROPHIL: 1 %
HCT: 23 % — AB (ref 35.0–47.0)
HGB: 7.4 g/dL — ABNORMAL LOW (ref 12.0–16.0)
LYMPHS PCT: 4 %
MCH: 24.7 pg — ABNORMAL LOW (ref 26.0–34.0)
MCHC: 32.1 g/dL (ref 32.0–36.0)
MCV: 77 fL — AB (ref 80–100)
METAMYELOCYTE: 3 %
MYELOCYTE: 5 %
Monocytes: 4 %
Platelet: 202 10*3/uL (ref 150–440)
RBC: 2.98 10*6/uL — AB (ref 3.80–5.20)
RDW: 20.3 % — ABNORMAL HIGH (ref 11.5–14.5)
SEGMENTED NEUTROPHILS: 83 %
WBC: 47.4 10*3/uL — AB (ref 3.6–11.0)

## 2014-01-17 LAB — VANCOMYCIN, RANDOM: Vancomycin, Random: 25 ug/mL

## 2014-01-17 LAB — CULTURE, BLOOD (SINGLE)

## 2014-01-18 DIAGNOSIS — I38 Endocarditis, valve unspecified: Secondary | ICD-10-CM

## 2014-01-18 LAB — CBC WITH DIFFERENTIAL/PLATELET
BANDS NEUTROPHIL: 1 %
Comment - H1-Com2: NORMAL
HCT: 24.6 % — ABNORMAL LOW (ref 35.0–47.0)
HGB: 7.7 g/dL — ABNORMAL LOW (ref 12.0–16.0)
Lymphocytes: 7 %
MCH: 23.7 pg — ABNORMAL LOW (ref 26.0–34.0)
MCHC: 31.4 g/dL — AB (ref 32.0–36.0)
MCV: 76 fL — AB (ref 80–100)
METAMYELOCYTE: 3 %
Monocytes: 3 %
Myelocyte: 1 %
PLATELETS: 218 10*3/uL (ref 150–440)
RBC: 3.25 10*6/uL — ABNORMAL LOW (ref 3.80–5.20)
RDW: 20.8 % — AB (ref 11.5–14.5)
Segmented Neutrophils: 85 %
WBC: 47 10*3/uL — AB (ref 3.6–11.0)

## 2014-01-19 LAB — CULTURE, BLOOD (SINGLE)

## 2014-01-19 LAB — BASIC METABOLIC PANEL
Anion Gap: 9 (ref 7–16)
BUN: 37 mg/dL — ABNORMAL HIGH (ref 7–18)
CALCIUM: 7.9 mg/dL — AB (ref 8.5–10.1)
CO2: 32 mmol/L (ref 21–32)
Chloride: 97 mmol/L — ABNORMAL LOW (ref 98–107)
Creatinine: 4.88 mg/dL — ABNORMAL HIGH (ref 0.60–1.30)
EGFR (Non-African Amer.): 10 — ABNORMAL LOW
GFR CALC AF AMER: 12 — AB
Glucose: 126 mg/dL — ABNORMAL HIGH (ref 65–99)
Osmolality: 286 (ref 275–301)
Potassium: 3.9 mmol/L (ref 3.5–5.1)
SODIUM: 138 mmol/L (ref 136–145)

## 2014-01-19 LAB — CBC WITH DIFFERENTIAL/PLATELET
Bands: 11 %
HCT: 23.6 % — AB (ref 35.0–47.0)
HGB: 7.6 g/dL — ABNORMAL LOW (ref 12.0–16.0)
Lymphocytes: 8 %
MCH: 24.1 pg — ABNORMAL LOW (ref 26.0–34.0)
MCHC: 32 g/dL (ref 32.0–36.0)
MCV: 75 fL — AB (ref 80–100)
MYELOCYTE: 2 %
Metamyelocyte: 3 %
Monocytes: 8 %
PLATELETS: 229 10*3/uL (ref 150–440)
RBC: 3.14 10*6/uL — ABNORMAL LOW (ref 3.80–5.20)
RDW: 20.5 % — AB (ref 11.5–14.5)
SEGMENTED NEUTROPHILS: 68 %
WBC: 34 10*3/uL — ABNORMAL HIGH (ref 3.6–11.0)

## 2014-01-20 LAB — CBC WITH DIFFERENTIAL/PLATELET
Bands: 1 %
Eosinophil: 2 %
HCT: 23 % — ABNORMAL LOW (ref 35.0–47.0)
HGB: 7.4 g/dL — ABNORMAL LOW (ref 12.0–16.0)
LYMPHS PCT: 9 %
MCH: 24.4 pg — ABNORMAL LOW (ref 26.0–34.0)
MCHC: 32.1 g/dL (ref 32.0–36.0)
MCV: 76 fL — ABNORMAL LOW (ref 80–100)
MYELOCYTE: 2 %
Metamyelocyte: 1 %
Monocytes: 3 %
PLATELETS: 240 10*3/uL (ref 150–440)
RBC: 3.02 10*6/uL — ABNORMAL LOW (ref 3.80–5.20)
RDW: 20.5 % — ABNORMAL HIGH (ref 11.5–14.5)
SEGMENTED NEUTROPHILS: 82 %
WBC: 25.4 10*3/uL — AB (ref 3.6–11.0)

## 2014-01-20 LAB — RENAL FUNCTION PANEL
Albumin: 1.9 g/dL — ABNORMAL LOW (ref 3.4–5.0)
Anion Gap: 11 (ref 7–16)
BUN: 62 mg/dL — AB (ref 7–18)
CALCIUM: 8.1 mg/dL — AB (ref 8.5–10.1)
CHLORIDE: 96 mmol/L — AB (ref 98–107)
Co2: 30 mmol/L (ref 21–32)
Creatinine: 7.12 mg/dL — ABNORMAL HIGH (ref 0.60–1.30)
EGFR (African American): 8 — ABNORMAL LOW
EGFR (Non-African Amer.): 6 — ABNORMAL LOW
Glucose: 104 mg/dL — ABNORMAL HIGH (ref 65–99)
OSMOLALITY: 292 (ref 275–301)
PHOSPHORUS: 4.9 mg/dL (ref 2.5–4.9)
Potassium: 4.3 mmol/L (ref 3.5–5.1)
Sodium: 137 mmol/L (ref 136–145)

## 2014-01-22 LAB — CBC WITH DIFFERENTIAL/PLATELET
BANDS NEUTROPHIL: 4 %
HCT: 20 % — ABNORMAL LOW (ref 35.0–47.0)
HGB: 6.5 g/dL — ABNORMAL LOW (ref 12.0–16.0)
Lymphocytes: 4 %
MCH: 24.6 pg — AB (ref 26.0–34.0)
MCHC: 32.5 g/dL (ref 32.0–36.0)
MCV: 76 fL — AB (ref 80–100)
Metamyelocyte: 3 %
Monocytes: 9 %
Myelocyte: 2 %
PLATELETS: 253 10*3/uL (ref 150–440)
RBC: 2.65 10*6/uL — ABNORMAL LOW (ref 3.80–5.20)
RDW: 21.8 % — AB (ref 11.5–14.5)
Segmented Neutrophils: 78 %
WBC: 18.7 10*3/uL — ABNORMAL HIGH (ref 3.6–11.0)

## 2014-01-22 LAB — RENAL FUNCTION PANEL
ALBUMIN: 2 g/dL — AB (ref 3.4–5.0)
Anion Gap: 8 (ref 7–16)
BUN: 46 mg/dL — AB (ref 7–18)
CHLORIDE: 96 mmol/L — AB (ref 98–107)
CO2: 30 mmol/L (ref 21–32)
CREATININE: 7.55 mg/dL — AB (ref 0.60–1.30)
Calcium, Total: 8 mg/dL — ABNORMAL LOW (ref 8.5–10.1)
EGFR (African American): 7 — ABNORMAL LOW
EGFR (Non-African Amer.): 6 — ABNORMAL LOW
Glucose: 103 mg/dL — ABNORMAL HIGH (ref 65–99)
Osmolality: 280 (ref 275–301)
PHOSPHORUS: 5.9 mg/dL — AB (ref 2.5–4.9)
Potassium: 4.9 mmol/L (ref 3.5–5.1)
Sodium: 134 mmol/L — ABNORMAL LOW (ref 136–145)

## 2014-01-22 LAB — AEROBIC CULTURE

## 2014-01-23 LAB — CULTURE, BLOOD (SINGLE)

## 2014-02-06 ENCOUNTER — Ambulatory Visit: Payer: Self-pay | Admitting: Nephrology

## 2014-02-06 LAB — HEMOGLOBIN: HGB: 7.6 g/dL — ABNORMAL LOW (ref 12.0–16.0)

## 2014-02-23 ENCOUNTER — Inpatient Hospital Stay: Payer: Self-pay | Admitting: Internal Medicine

## 2014-02-23 LAB — BASIC METABOLIC PANEL
Anion Gap: 11 (ref 7–16)
Anion Gap: 11 (ref 7–16)
BUN: 54 mg/dL — AB (ref 7–18)
BUN: 53 mg/dL — ABNORMAL HIGH (ref 7–18)
CALCIUM: 9.1 mg/dL (ref 8.5–10.1)
Calcium, Total: 8.7 mg/dL (ref 8.5–10.1)
Chloride: 106 mmol/L (ref 98–107)
Chloride: 105 mmol/L (ref 98–107)
Co2: 23 mmol/L (ref 21–32)
Co2: 25 mmol/L (ref 21–32)
Creatinine: 14.59 mg/dL — ABNORMAL HIGH (ref 0.60–1.30)
Creatinine: 15.05 mg/dL — ABNORMAL HIGH (ref 0.60–1.30)
EGFR (African American): 3 — ABNORMAL LOW
EGFR (Non-African Amer.): 3 — ABNORMAL LOW
GFR CALC AF AMER: 3 — AB
GFR CALC NON AF AMER: 3 — AB
GLUCOSE: 75 mg/dL (ref 65–99)
GLUCOSE: 29 mg/dL — AB (ref 65–99)
OSMOLALITY: 292 (ref 275–301)
Osmolality: 293 (ref 275–301)
Potassium: 6.8 mmol/L (ref 3.5–5.1)
Potassium: 5.3 mmol/L — ABNORMAL HIGH (ref 3.5–5.1)
Sodium: 140 mmol/L (ref 136–145)
Sodium: 141 mmol/L (ref 136–145)

## 2014-02-23 LAB — CBC
HCT: 22.7 % — AB (ref 35.0–47.0)
HGB: 7.7 g/dL — AB (ref 12.0–16.0)
MCH: 28.2 pg (ref 26.0–34.0)
MCHC: 34.1 g/dL (ref 32.0–36.0)
MCV: 83 fL (ref 80–100)
Platelet: 366 10*3/uL (ref 150–440)
RBC: 2.74 10*6/uL — AB (ref 3.80–5.20)
RDW: 28.6 % — ABNORMAL HIGH (ref 11.5–14.5)
WBC: 10.8 10*3/uL (ref 3.6–11.0)

## 2014-02-23 LAB — TROPONIN I: Troponin-I: 0.05 ng/mL

## 2014-02-23 LAB — MAGNESIUM: MAGNESIUM: 2.8 mg/dL — AB

## 2014-02-23 LAB — CLOSTRIDIUM DIFFICILE(ARMC)

## 2014-02-24 LAB — RENAL FUNCTION PANEL
ALBUMIN: 2.4 g/dL — AB (ref 3.4–5.0)
Anion Gap: 5 — ABNORMAL LOW (ref 7–16)
BUN: 23 mg/dL — AB (ref 7–18)
CO2: 33 mmol/L — AB (ref 21–32)
Calcium, Total: 7.6 mg/dL — ABNORMAL LOW (ref 8.5–10.1)
Chloride: 102 mmol/L (ref 98–107)
Creatinine: 7.92 mg/dL — ABNORMAL HIGH (ref 0.60–1.30)
EGFR (Non-African Amer.): 6 — ABNORMAL LOW
GFR CALC AF AMER: 7 — AB
GLUCOSE: 95 mg/dL (ref 65–99)
Osmolality: 283 (ref 275–301)
PHOSPHORUS: 3.4 mg/dL (ref 2.5–4.9)
Potassium: 5.2 mmol/L — ABNORMAL HIGH (ref 3.5–5.1)
Sodium: 140 mmol/L (ref 136–145)

## 2014-02-24 LAB — CBC WITH DIFFERENTIAL/PLATELET
Basophil #: 0 10*3/uL (ref 0.0–0.1)
Basophil %: 0.6 %
EOS PCT: 1.7 %
Eosinophil #: 0.1 10*3/uL (ref 0.0–0.7)
HCT: 20.4 % — AB (ref 35.0–47.0)
HGB: 6.5 g/dL — AB (ref 12.0–16.0)
Lymphocyte #: 0.8 10*3/uL — ABNORMAL LOW (ref 1.0–3.6)
Lymphocyte %: 13.1 %
MCH: 26.1 pg (ref 26.0–34.0)
MCHC: 31.9 g/dL — ABNORMAL LOW (ref 32.0–36.0)
MCV: 82 fL (ref 80–100)
MONOS PCT: 8.6 %
Monocyte #: 0.6 x10 3/mm (ref 0.2–0.9)
NEUTROS PCT: 76 %
Neutrophil #: 4.8 10*3/uL (ref 1.4–6.5)
PLATELETS: 273 10*3/uL (ref 150–440)
RBC: 2.49 10*6/uL — AB (ref 3.80–5.20)
RDW: 28.3 % — ABNORMAL HIGH (ref 11.5–14.5)
WBC: 6.4 10*3/uL (ref 3.6–11.0)

## 2014-02-25 LAB — HEMOGLOBIN: HGB: 7.3 g/dL — AB (ref 12.0–16.0)

## 2014-02-25 LAB — CULTURE, BLOOD (SINGLE)

## 2014-04-03 ENCOUNTER — Emergency Department: Payer: Self-pay | Admitting: Emergency Medicine

## 2014-05-08 NOTE — Op Note (Signed)
PATIENT NAME:  Kelsey Peterson, Kelsey Peterson MR#:  [id number] DATE OF BIRTH:  [birth year]  DATE OF PROCEDURE:  10/02/2011  PREOPERATIVE DIAGNOSES:  1. Complication arteriovenous dialysis device.  2. End-stage renal disease requiring hemodialysis.   POSTOPERATIVE DIAGNOSIS:  1. Complication arteriovenous dialysis device.  2. End-stage renal disease requiring hemodialysis.   PROCEDURES PERFORMED:  1. Contrast injection right arm brachiocephalic fistula.  2. Percutaneous transluminal angioplasty of venous portion right arm brachiocephalic fistula.   PROCEDURE PERFORMED BY: Katha Cabal, MD   SEDATION: Versed 2 mg plus fentanyl 50 mcg administered IV. Continuous ECG, pulse oximetry and cardiopulmonary monitoring was performed throughout the entire procedure by the Interventional Radiology nurse. Total sedation time was one hour.   ACCESS: 6-French sheath, antegrade direction, left arm brachiocephalic fistula.   CONTRAST USED: Isovue 30 mL.   FLUOROSCOPY TIME:  1.0 minutes.   INDICATIONS: Ms. Borromeo is a 59 year old woman who has had multiple problems with her fistula. She recently underwent a surgical revision. Physical examination now demonstrates significant pulsatility, and prior to initiation of use she is undergoing contrast injection. The risks and benefits are reviewed and the patient agrees to proceed.   DESCRIPTION OF PROCEDURE: The patient is taken to Special Procedures and placed in the supine position. After adequate sedation is achieved, the patient is positioned with her right arm extended palm upward. The right arm is prepped and draped in sterile fashion. One percent lidocaine is infiltrated into the soft tissues, and access to the fistula near the arterial anastomosis was obtained without difficulty.  A microwire, followed by a micro sheath, J-wire followed by a 6-French sheath is then inserted. Hand injection of contrast is then used to demonstrate the fistula. There is one area of  irregularity. There is tortuosity noted near the shoulder, but no other abnormalities are identified. Heparin, 3000 units, is given; a Advice worker is then advanced, and first an 8 x 4 and subsequently a 10 x 4 balloon is advanced across the area of irregularity and inflated to 12 and then 20 atmospheres. The second 10 mm inflation is for one minute. Follow-up angiography demonstrates resolution of the irregularity, and palpation of the fistula demonstrates a more continuous thrill.   Reflux images with the balloon inflated also demonstrate wide patency of the arterial anastomosis. The wire and balloon are removed. The sheath is pulled after pursestring suture of Monocryl is placed, and there are no immediate complications.   INTERPRETATION: Initial views demonstrate there is an area of irregularity in the venous portion. The arterial portion is widely patent. There appears to be patency central. Following angioplasty at 10, there appears to be significant improvement and by palpation there is improvement in the fistula itself.   SUMMARY: Successful angioplasty of venous portion right arm brachiocephalic fistula.   ____________________________ Katha Cabal, MD ggs:cbb D: 10/02/2011 15:33:34 ET T: 10/02/2011 15:57:04 ET JOB#: [id number]  cc: Katha Cabal, MD, <Dictator> Katha Cabal MD ELECTRONICALLY SIGNED 10/20/2011 14:05

## 2014-05-08 NOTE — Discharge Summary (Signed)
PATIENT NAME:  Kelsey Peterson, Kelsey Peterson MR#:  [id number] DATE OF BIRTH:  11/28/1955  DATE OF ADMISSION:  07/31/2011 DATE OF DISCHARGE:  08/06/2011  ADMITTING DIAGNOSIS: Systemic inflammatory response reaction.   DISCHARGE DIAGNOSES:  1. Methicillin-susceptible Staphylococcus aureus sepsis. 2. Hypotension.  3. Metabolic encephalopathy due to sepsis, resolved.  4. Status post right internal jugular permanent catheter removal on 08/01/2011 status post left internal jugular permanent catheter placement on 08/05/2011 by Dr. Delana Meyer. Last hemodialysis was on 08/06/2011.  5. History of hypertension. 6. End-stage renal disease with hemodialysis Tuesdays, Thursdays and Saturdays. 7. Status post right chest permanent catheter removal as mentioned above 08/01/2011 and left internal jugular permanent catheter placement on 08/05/2011.  8. Right upper extremity fistulogram on 08/05/2011.   DISCHARGE CONDITION: Stable.   DISCHARGE MEDICATIONS: Patient is to resume her outpatient medications which are:  1. Amlodipine 5 mg p.o. daily.  2. Dilaudid 2 mg every six hours as needed.  3. Iron sulfate 325 mg p.o. twice daily. 4. Nexium 40 mg p.o. daily. 5. Vitamin D 50,000 units, 1 capsule once daily. 6. ProAir HFA 2 puffs every four hours as needed.  7. Renvela 800 mg 2 tablets 3 times daily with meals and 1 tablet with snacks.  8. Percocet 5/325 mg 1 tablet q.6 hours as needed.  9. Lasix 80 mg p.o. Mondays, Wednesdays, Fridays once a day.  10. EMLA 2.5/2.5 topical cream to affected area once before hemodialysis.   ADDITIONAL MEDICATIONS:  1. Cefazolin 2 grams IV after hemodialysis on Tuesday as well as Thursday and 3 grams on  Saturdays through 08/16/2011.  2. Patient is not to take Imdur or hydralazine for two more days and then restart it.   DIET: Hemodialysis diet. Diet consistency is regular.   ACTIVITY LIMITATIONS: As tolerated.   FOLLOW UP: Follow-up appointment with Dr. Clayborn Bigness in two days after  discharge. Patient is to follow up with hemodialysis center in the next two days after discharge.   HISTORY OF PRESENT ILLNESS:  Patient is a 59 year old African American female with history of end-stage renal disease who presented to the hospital with confusion as well as fever. Please refer to Dr. Wyatt Portela admission note on 07/31/2011. On arrival to the hospital patient's temperature was 100.7, pulse 110, respiration rate 22, blood pressure 134/70, saturation 94% on room air initially. Physical exam revealed fine basilar crackles as well as diffuse scattered wheezes on lung exam. Patient did have confusion.  LABORATORY, DIAGNOSTIC AND RADIOLOGICAL DATA: Patient's lab data 07/31/2011 showed BUN and creatinine of 49 and 7.82 respectively, potassium 6.0, otherwise BMP was unremarkable. Patient's liver enzymes showed alkaline phosphatase elevated at 160 and AST was 59. Patient's cardiac enzymes x3 were negative. White blood cell count was markedly elevated to 20.8, hemoglobin 10.8, platelet count 339. Blood cultures were taken and patient was growing staphylococcus aureus in aerobic as well as anaerobic bottles 4 out of 4, gram-positive cocci in clusters were noted.   HOSPITAL COURSE: Initially patient was started on vancomycin IV and her cultures were followed. It was felt that patient's hemodialysis catheter on the right side was culprit for infection. Patient's hemodialysis catheter was removed on 08/01/2011. Patient's blood cultures were rechecked on 08/02/2011 and they were negative. Patient had hemodialysis catheter replaced on the left side of her chest permanent catheter was placed on 08/05/2011 and patient was continued on hemodialysis while she was in the hospital. Patient is to continue antibiotic therapy with cefazolin 2 grams on Tuesday as well as Thursday and  3 grams on Saturday through 08/16/2011 and this will be communicated by Dr. Candiss Norse to her hemodialysis center. The trial to use patient's  right upper extremity IV fistula was made, however, patient's access was very poor so patient underwent right upper extremity fistulogram on 08/05/2011 by Dr. Delana Meyer the same day when patient had her permanent catheter placed. Dr. Delana Meyer felt that patient's fistula showed significantly very large vein, however, very tortuous and that is why axis was somewhat limited. He felt that other surgery would be needed to straighten this vein. Because of inability to use right upper extremity fistula this left IJ PermCath was placed. Post procedure patient had significant pains and had to stay overnight from 07/17 to 08/06/2011. On 08/06/2011 she felt satisfactory, did not complain of any significant pains. She had no fevers and her vital signs were stable. Her blood pressure improved. Initially her blood pressure was very low, that is why her blood pressure medications were placed on hold, however, her blood pressure improved significantly. She is to resume her one outpatient medication which is Norvasc, however, she is to hold her hydralazine as well as Imdur until she is seen by her primary care physician in two days. If her blood pressure is elevated she is to resume all her blood pressure medications.   In regards to leukocytosis, patient's white cell count normalized as soon as patient's central vein catheter or hemodialysis catheter from the right side of her chest was removed. Already on 08/02/2011 patient's white blood cell count is normal at 8.1 and no left shift was noted.   In regards to chronic medical problems such as anemia of chronic disease, patient's hemoglobin was checked, it was stable. Initially patient's hemoglobin level was slightly high at 10.8, however, it remained somewhat stable 9.7 on the day of discharge, 08/06/2011. Patient's potassium level was intermittently high, however, after hemodialysis patient's potassium level normalized. On 08/06/2011 patient's potassium level was 4.4. Patient's  phosphorus level also was rechecked while in the hospital. Initially, phosphorus level was normal at 4.1 on 08/03/2011, however, it was noted to be high at 6.4 on 07/17/20913. It was also somewhat high at 5.3 on 08/06/2011 after hemodialysis. Patient's alkaline phosphatase which was slightly elevated on admission to 160 was normal on repeated lab studies. Patient's AST was slightly elevated as well and remained slightly elevated on repeated studies. It is recommended to follow patient's liver function tests to ensure its stable condition.   Patient is being discharged in stable condition with above-mentioned medications and follow up. She is to resume iron for iron deficiency. She is to resume vitamin D for vitamin D deficiency. She is to continue Percocet as needed for her discomfort in her hemodialysis catheter site. She is to continue Lasix on nondialysis days.  TIME SPENT: 40 minutes.   ____________________________ Theodoro Grist, MD rv:cms D: 08/06/2011 16:34:23 ET T: 08/07/2011 11:56:57 ET JOB#: [id number] cc: Theodoro Grist, MD, <Dictator> Lavera Guise, MD Hemod[facility] [place] MD ELECTRONICALLY SIGNED 08/09/2011 18:47

## 2014-05-08 NOTE — Op Note (Signed)
PATIENT NAME:  Kelsey Peterson, Kelsey Peterson MR#:  [id number] DATE OF BIRTH:  1955-09-04  DATE OF PROCEDURE:  11/04/2011  PREOPERATIVE DIAGNOSES:  1. End-stage renal disease requiring hemodialysis.  2. Complication AV dialysis device.    POSTOPERATIVE DIAGNOSES: 1. End-stage renal disease requiring hemodialysis.  2. Complication AV dialysis device.    PROCEDURE PERFORMED: Removal of left IJ cuffed tunneled dialysis catheter.   SURGEON: Katha Cabal, MD    ANESTHESIA: 1% local lidocaine.   DESCRIPTION OF PROCEDURE: The patient is positioned supine. Cuff is palpated. 1% lidocaine with epinephrine is infiltrated in the surrounding soft tissues and a small transverse incision is created. Cuff is then dissected free from the surrounding tissues. Catheter is transected. Hub assembly is removed without difficulty and the intravascular portion is then removed without difficulty. Pressure is held at the base of the neck. 4-0 Monocryl was used to close the incision and Dermabond is applied. Sterile dressing is applied to the exit site. The patient tolerated the procedure well and there were no immediate complications.   ____________________________ Lane Hacker, PA-C jmk:drc D: 11/04/2011 09:11:50 ET T: 11/04/2011 09:22:57 ET JOB#: [id number]  cc: Lane Hacker, PA-C, <Dictator> Lane Hacker PA ELECTRONICALLY SIGNED 11/04/2011 10:59

## 2014-05-08 NOTE — Op Note (Signed)
PATIENT NAME:  Kelsey Peterson, Kelsey Peterson MR#:  [id number] DATE OF BIRTH:  01-31-1955  DATE OF PROCEDURE:  08/05/2011  PREOPERATIVE DIAGNOSES:  1. Complication AV dialysis device with inability to access right brachiocephalic fistula.  2. Catheter-related sepsis.  3. Lack of dialysis access.   POSTOPERATIVE DIAGNOSES:  1. Complication AV dialysis device with inability to access right brachiocephalic fistula.  2. Catheter-related sepsis.  3. Lack of dialysis access.   PROCEDURES PERFORMED:  1. Contrast injection, right arm brachiocephalic fistula.  2. Left IJ Perm-A-Cath placement.   SURGEON: Katha Cabal, MD  SEDATION: Precedex drip. Continuous ECG, pulse oximetry and cardiopulmonary monitoring is performed throughout the entire procedure by the interventional radiology nurse. Total sedation time was one hour.   ACCESS:  1. Micropuncture sheath right arm brachiocephalic fistula.  2. Left IJ.   FLUOROSCOPY TIME: 2.5 minutes.   CONTRAST USED: Isovue 20 mL.   INDICATIONS: Ms. Rother is a 59 year old woman who presented to the hospital with catheter-related sepsis. She has subsequently undergone removal of her catheter and now is being returned for evaluation of her dialysis access. Attempts in dialysis today they were unable to access her fistula. The risks and benefits for contrast injection, insertion of a Perm-A-Cath were reviewed in detail. All questions answered. Patient agrees to proceed.   DESCRIPTION OF PROCEDURE: Patient is taken to special procedures, placed in supine position. After adequate sedation on Precedex drip has been achieved, her right arm is extended palm upward, prepped and draped in a sterile fashion. 1% lidocaine is infiltrated in the soft tissues and access to the fistula is obtained with a micropuncture needle, microwire followed micro sheath. Stopcock is placed and hand injection of contrast is used to demonstrate the fistula itself. Compression of the fistula  more proximally is used to reflux contrast into the artery as well. After review of the images a pursestring suture of 4-0 Monocryl is placed. Sheath is pulled and there are no immediate complications.   The left neck and chest wall are then prepped and draped in a sterile fashion. 1% lidocaine is infiltrated in the soft tissues at the base of the neck. Ultrasound is placed in a sterile sleeve. Ultrasound is utilized secondary to lack of appropriate landmarks and to avoid vascular injury. The jugular vein is identified. It is echolucent, homogeneous, and easily compressible indicating patency. Image is recorded for the permanent record and access to the jugular vein is obtained under direct ultrasound visualization utilizing a micropuncture kit. J-wire is advanced through the micro sheath under fluoroscopic guidance and negotiated into the inferior vena cava. Small incision is made with an 11 blade scalpel, pocket created with a hemostat and the dilator is passed over the wire. Peel-away sheath is inserted and a 23 cm tip to cuff Cannon catheter is advanced over the wire through the peel-away sheath. Peel-away sheath and wire are removed. The catheter is evaluated under fluoroscopy. It is noted to be well within the atrium. It is then pulled back to the appropriate place and approximated to the chest wall exit site is selected. 1% lidocaine is infiltrated in the soft tissues. A small exit site incision is created with a scalpel. Tunneling device is passed from the exit site to the neck counterincision and after dilating the tract the catheter is pulled subcutaneously. Hub is connected. Catheter is positioned with its tips within the proximal atrium under fluoroscopic guidance. Both lumens aspirate, flush easily and the catheter is free of kinks. It is then packed  with 5000 units of heparin per lumen. It is secured to the chest wall with 0 silk. Neck counterincision is closed with 4-0 Monocryl in a subcuticular and  Dermabond. Patient tolerated procedure well and there were no immediate complications.   INTERPRETATION: The right arm brachiocephalic fistula demonstrates a widely patent anastomosis, however, the vein itself, although quite large 8 to 9 mm in many areas, it is very tortuous doing several loop-the-loops and will not be acceptable as an access. Although this vein is of excellent size it will need to be transposed and straightened to allow for adequate dialysis access. Central veins are widely patent.       Left IJ tunneled catheter in excellent position.   ____________________________ Katha Cabal, MD ggs:cms D: 08/06/2011 18:49:00 ET T: 08/07/2011 10:08:44 ET JOB#: [id number]  cc: Katha Cabal, MD, <Dictator> Munsoor Lilian Kapur, MD Lavera Guise, MD Katha Cabal MD ELECTRONICALLY SIGNED 08/11/2011 13:01

## 2014-05-08 NOTE — Op Note (Signed)
PATIENT NAME:  Kelsey Peterson, Kelsey Peterson MR#:  [id number] DATE OF BIRTH:  17-Aug-1955  DATE OF PROCEDURE:  09/02/2011  PREOPERATIVE DIAGNOSES:  1. Complication AV dialysis device with inability to cannulate fistula.  2. Hematoma secondary to attempted cannulation right arm.  3. Right brachiocephalic fistula.   POSTOPERATIVE DIAGNOSES:  1. Complication AV dialysis device with inability to cannulate fistula.  2. Hematoma secondary to attempt cannulation right arm.  3. Right brachiocephalic fistula.   PROCEDURE:  Revision of right arm brachiocephalic fistula with resection of a 3-cm segment of the redundant vein and subsequent primary anastomosis well as transposition of the vein itself.   SURGEON: Katha Cabal, M.D.   ANESTHESIA: General by LMA.   FLUIDS: Per anesthesia record.   ESTIMATED BLOOD LOSS: 100 mL.   SPECIMEN: Segment of resected cephalic vein to pathology for permanent section.   INDICATIONS: Ms. Fulwider is a 59 year old woman who is on hemodialysis. She is currently maintained with a PermCath but has had a brachiocephalic fistula created. There have been significant difficulties with cannulation and angiography has demonstrated that there is marked tortuosity with a very serpentine course, which would make this fistula virtually impossible to cannulate. She is therefore undergoing revision with transposition closer to the skin to allow for easy access in cannulation. Risks and benefits have been reviewed. The patient agrees to proceed.   DESCRIPTION OF PROCEDURE: The patient is taken to the operating room and placed in the supine position. After adequate general anesthesia is induced and appropriate invasive monitors are placed, she is positioned supine with her right arm palm upward. Right arm is prepped and draped in sterile fashion. 0.25% Marcaine is infiltrated into the skin along the projected incision.   A #15 blade is then used to incise the skin and the dissection is  carried down to the fistula itself in spots. The fistula is then dissected circumferentially along its entire course. At one point I did enter the fistula and this was easily repaired with a 6-0 Prolene in a figure-of-eight fashion. Once the entire length of the fistula had been circumferentially dissected and a synechia on the outside of the vein treated, the length was assessed. A line was placed along the entire fistula with a surgical marker for orientation, and then after 3000 units of heparin was given it was clamped proximally and distally. Approximately a 3-cm long segment was resected and then a primary end-to-end anastomosis was fashioned using interrupted 6-0 Prolene. Flushing maneuvers were performed and flow was reestablished through the fistula. Excellent thrill is noted. The suture line is then inspected. The wound base is inspected for hemostasis. They are both irrigated copiously with saline and then using several deep dermal 3-0 Vicryl interrupted sutures followed by 4-0 Monocryl subcuticular for closure of skin and Dermabond, the skin is reapproximated directly over the fistula. There are no immediate complications. Sponge and needle counts are correct times two. She is taken to the recovery area in excellent condition.   ____________________________ Katha Cabal, MD ggs:bjt D: 09/02/2011 20:32:35 ET T: 09/03/2011 09:16:50 ET JOB#: [id number]  cc: Katha Cabal, MD, <Dictator> Lavera Guise, MD [place] Lilian Kapur, MD Katha Cabal MD ELECTRONICALLY SIGNED 09/15/2011 10:48

## 2014-05-11 NOTE — Op Note (Signed)
DATE OF BIRTH:  September 09, 1955  DATE OF PROCEDURE:  02/15/2012  PREOPERATIVE DIAGNOSES:  1.  End-stage renal disease.  2.  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  3.  Hypertension.   POSTOPERATIVE DIAGNOSES:  1.  End-stage renal disease.  2.  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  3.  Hypertension.   PROCEDURE: 1.  Ultrasound guidance for vascular access to right brachiocephalic AV fistula.  2.  Right upper extremity fistulogram and central venogram.  3.  Percutaneous transluminal angioplasty of cephalic vein at the shoulder with 10 and 12 mm diameter angioplasty balloon.   SURGEON:  Algernon Huxley, MD  ANESTHESIA:  Local with moderate conscious sedation.   BLOOD LOSS:  25 mL.   INDICATION FOR PROCEDURE:  A 59 year old African American female with end-stage renal disease. She has an aneurysm near the arterial access site of her right brachiocephalic AV fistula. This is a tortuous fistula with difficulty with access, and she had a noninvasive study that showed an elevated velocity in the cephalic vein near the confluence of the subclavian vein consistent with stenosis. She was brought in for further evaluation and possible treatment. Risks and benefits were discussed. Informed consent was obtained.   DESCRIPTION OF THE PROCEDURE:  The patient was brought to the vascular suite. The right upper extremity was sterilely prepped and draped, and a sterile surgical field was created. The fistula was accessed under direct ultrasound guidance just beyond the anastomosis prior to the pseudoaneurysm. With a micropuncture needle, a micropuncture wire and sheath were placed, and a permanent image was recorded, then upsized to a 6-French sheath. Imaging showed a moderate sized pseudoaneurysm about 5 to 6 cm beyond the fistula anastomosis. The cephalic vein was significantly tortuous in the upper arm. At the shoulder, there was an area that  appeared to narrow down, although it also correlated with an area of tortuosity, and so delineating the complete degree of stenosis was difficult, but this did appear to be a greater than 50% stenosis and correlated with our ultrasound findings. The central venous circulation was patent. I then used a Kumpe catheter and a Magic Torque wire across the lesion, gave the patient a small dose of intravenous heparin, and performed percutaneous transluminal angioplasty with a 10 and then a 12 mm diameter angioplasty balloon. There was some waist that was taken, and completion angiogram showed improvement in this area, although it was still not entirely normal, and likely some degree of the narrowing was due to redundancy and tortuosity. At this point, there were no other focal areas of stenosis identified within the fistula, and I elected to terminate the procedure. The sheath was removed around a 4-0 Monocryl pursestring suture. Pressure was held. Sterile dressing was placed. The patient tolerated the procedure well and was taken to the recovery room in stable condition.    ____________________________ Algernon Huxley, MD jsd:ms D: 02/15/2012 13:11:51 ET T: 02/15/2012 22:41:41 ET JOB#: [id number]  cc: Algernon Huxley, MD, <Dictator> Algernon Huxley MD ELECTRONICALLY SIGNED 02/17/2012 7:35

## 2014-05-12 NOTE — Op Note (Signed)
PATIENT NAME:  Kelsey Peterson, Kelsey Peterson MR#:  [id number] DATE OF BIRTH:  January 17, 1956  DATE OF PROCEDURE:  03/01/2013  DATE OF DICTATION: 03/01/2013   PREOPERATIVE DIAGNOSES:  1. End-stage renal disease.  2. Decreased function, right arm arteriovenous fistula.   POSTOPERATIVE DIAGNOSES:  1. End-stage renal disease.  2. Decreased function, right arm arteriovenous fistula.   PROCEDURE: 1. Ultrasound guidance for vascular access, right brachiocephalic arteriovenous fistula. 2. Right upper extremity fistulogram and central venogram.   SURGEON: Algernon Huxley, MD  ANESTHESIA: Local with moderate conscious sedation.   ESTIMATED BLOOD LOSS: Minimal.   INDICATION FOR PROCEDURE: This is a female well known to [place] for her dialysis access needs. 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 We are assessing this with a fistulogram.   DESCRIPTION OF THE PROCEDURE: The patient is brought to the vascular suite. Her right upper extremity was sterilely prepped and draped, and a sterile surgical field was created. The fistula was accessed near the arterial access site with a micropuncture needle. Under direct ultrasound guidance, a micropuncture wire and sheath were then placed. Imaging was performed through the micropuncture sheath. The fistula was seen to be quite aneurysmal and very tortuous, but no stenoses were identified throughout the fistula, including the central venous portion of the fistula. We compressed the fistula as best we could and opacified the arterial portion of the fistula, which also appeared patent, without significant stenosis. At this point, we elected to terminate the procedure. The sheath was removed. A 4-0 Monocryl pursestring suture was placed, pressure was held. Sterile dressing was placed. The patient tolerated the procedure well and was taken to the recovery room in stable condition.     ____________________________ Algernon Huxley, MD jsd:lb D: 03/01/2013 09:08:48 ET T: 03/01/2013 09:29:34 ET JOB#: [id number]  cc: Algernon Huxley, MD, <Dictator> Mun[place] Lilian Kapur, MD Murlean Iba, MD Mamie Levers, MD Algernon Huxley MD ELECTRONICALLY SIGNED 03/08/2013 10:51

## 2014-05-12 NOTE — H&P (Signed)
PATIENT NAME:  Kelsey Peterson, Kelsey Peterson MR#:  [id number] DATE OF BIRTH:  [birth year]  DATE OF ADMISSION:  01/11/2014  REFERRING PHYSICIAN: Boris Lown, MD   PRIMARY CARE PHYSICIAN: Lavera Guise, MD   ADMISSION DIAGNOSIS: Uremia and fluid overload.   HISTORY OF PRESENT ILLNESS: This is a 59 year old African American female who presents to the Emergency Department complaining of weakness. The patient admits to having missed 2 dialysis treatments (including today) due to feeling unwell the last 4 days. The patient states that initially she had fever and malaise and then developed some diarrhea and abdominal pain. She expresses to me that she did not want to go to her first dialysis treatment and thought it might be okay because she wanted a "Christmas miracle." When her family had not heard from her yesterday they went by her home to discover that she had been incontinent of stool and was too weak to stand on her own. They finally convinced her today to come to the hospital for evaluation.   In the Emergency Department, the patient was found to be mildly dehydrated, extremely weak and uremic with a creatinine of 16.75, which prompted the Emergency Department staff to call for admission.   REVIEW OF SYSTEMS:  CONSTITUTIONAL: The patient admits to subjective fever and generalized weakness.  EYES:  Denies inflammation or blurry vision.  EARS AND NOSE AND THROAT: Denies tinnitus or sore throat.  RESPIRATORY: Denies cough or shortness of breath.  CARDIOVASCULAR: Denies chest pain, palpitations, orthopnea, paroxysmal nocturnal dyspnea.  GASTROINTESTINAL: Admits to nausea but denies vomiting. The patient admits to diarrhea and abdominal pain as well.  GENITOURINARY: Denies dysuria, increased frequency, or hesitancy of urination. HEMATOLOGIC AND LYMPHATIC:  Denies easy bruising or bleeding.  ENDOCRINE: Denies polyuria, or polydipsia.  INTEGUMENTARY: Denies rashes, or lesions.  MUSCULOSKELETAL: Denies  myalgias, or arthralgias.  NEUROLOGIC: Denies numbness in extremities, or dysarthria.  PSYCHIATRIC: Denies depression, or suicidal ideation.   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   SURGICAL HISTORY: Right upper extremity fistula placement and right internal jugular vascular catheter placement.   SOCIAL HISTORY: The patient lives alone. She does not smoke, drink or do any drugs.   FAMILY HISTORY: Chronic kidney disease.   MEDICATIONS: 1.  Amlodipine 5 mg 1 tab p.o. daily.  2.  Calcium carbonate 500 mg 1 tablet p.o. 3 times a day after meals.  3.  Dialyvite supreme oral vitamin D 1 tablet p.o. daily.  4.  Ferrous sulfate 325 mg 1 tablet p.o. daily.  5.  Fexofenadine 30 mg 1 disintegrating tablet p.o. b.i.d.  6.  Furosemide 80 mg 1 tablet p.o. on Monday, Wednesdays, Fridays and Sundays.  7.  Imdur 30 mg extended release 1 tablet p.o. b.i.d.  8.  Lidocaine with prilocaine 2.5%/2.5% topical cream apply to the affected area once a day in the morning prior to dialysis treatments.  9.  Nexium 40 mg delayed release 1 capsule p.o. daily.  10.  ProAir high flow inhaler 90 mcg/inhalation 2 puffs inhaled every 4 hours as needed for coughing, wheezing, or shortness of breath.  11.  Renvela 800 mg 1 tablet p.o. t.i.d. with meals and 1 tablet with snacks.   ALLERGIES: THE PATIENT REPORTS AN ALLERGY TO A BLOOD PRESSURE MEDICINE THAT CAUSED SWELLING OF HER FACE BUT SHE DOES NOT KNOW WHICH ONE. SHE IS ALSO ALLERGIC TO IV DYE AS WELL AS MORPHINE SULFATE.  PERTINENT LABORATORY RESULTS AND RADIOGRAPHIC FINDINGS: Serum glucose is 113, BUN 96, creatinine 16.75, serum sodium is 136, potassium 4.8, chloride 100, bicarbonate 20, calcium is 8.2, lipase is 120; serum albumin is 2.7, alkaline phosphatase 144, AST 63, ALT 39; there was no troponin at the time of admission; white blood cell count is 13.9,  hemoglobin is 10.2, hematocrit is 31.9, platelet count 211,000, MCV is 80, CT of the head without contrast shows atrophy with a small vessel ischemic change that is chronic in the deep cerebral white matter, there are no acute intracranial abnormalities.   PHYSICAL EXAMINATION: VITAL SIGNS: Temperature is 98.1, pulse is 82, respirations 18, blood pressure is 155/89; pulse oximetry is 90% on room air.  GENERAL: The patient is easily arousable but generally listless, she is in no apparent distress.  HEENT: Normocephalic, atraumatic. Pupils equal, round, and reactive to light and accommodation. Extraocular movements are intact. Mucous membranes are tacky. The patient is deaf but she is able to read lips.    NECK: Trachea is midline. No adenopathy.  CHEST: Symmetric and atraumatic. Vascular catheter is in place in the right internal jugular and subclavian junction. The access site is clean and dressed and catheter tip closed to the air with a clean dressing.  CARDIOVASCULAR: Regular rate and rhythm. Normal S1, S2, no clicks, or murmurs; I do not hear any rubs.  LUNGS: Clear to auscultation bilaterally, normal effort and excursion.  ABDOMEN: Positive bowel sounds, soft, nontender, nondistended, no hepatosplenomegaly.  GENITOURINARY: Deferred.  MUSCULOSKELETAL: The patient is able to move all 4 extremities equally, she has 4+/5 strength in upper and lower extremities bilaterally. I have not walked the patient to demonstrate her ability or strength to ambulate.  SKIN: No rashes or lesions.  EXTREMITIES: No clubbing or cyanosis. The patient does have some mild nontense, nonpitting pretibial edema.  NEUROLOGIC: Cranial nerves II through XII except for cranial nerve IV are grossly intact.  PSYCHIATRIC: Mood is normal. Affect is congruent.   ASSESSMENT AND PLAN: This is a 59 year old female admitted for uremia and fluid overload.  1.  Uremia with fluid overload. The patient has missed 2 dialysis treatments  due to feeling unwell. We will continue her maintenance dose of Lasix per home regimen to hopefully maintain fluid balance while we gently hydrate her as she is intravascularly dry.  2.  Abdominal pain. She has had some associated diarrhea and generalized pain as well. She is afebrile and her constellation of symptoms generally appear to be viral in origin, but we will obtain a CBC in the morning with a differential to be able to distinguish between viral and bacterial etiology. If the patient appears septic while hospitalized, we will dose vancomycin on dialysis. I have also ordered a Clostridium difficile toxin assay.  3.  Dehydration. The patient has dry mucous membranes and generally feels bad with a decreased bicarbonate which generally indicates intravascular depletion even though she has some pretibial edema due to her poor renal function; we will gently hydrate the patient prior to scheduling her for dialysis first thing in the morning.  4.  Hypertension. It is controlled at this time. Continue amlodipine and Imdur.  5.  Sinus tachycardia. This is possibly due to dehydration but differential diagnosis includes pulmonary embolism in that when questioned further the patient indicates that her generalized pain includes pain with deep respirations. It is difficult to characterize exactly the origin of her pain. The patient does not have hypoxia at the time of exam, (even though  she apparently developed some mild hypoxia after arriving to the floor). If she develops more dramatic signs or symptoms of possible pulmonary embolism, we will go ahead and anticoagulate her if we are unable to coordinate a CT angiogram prior to dialysis and obtaining a d-dimer is not helpful in this case as the patient has end-stage renal disease.  6.  Secondary renal osteodystrophy. We will continue sevelamer and TUMS.    7.  Deep vein thrombosis prophylaxis. Subcutaneous heparin every 8 hours.  8.  Gastrointestinal prophylaxis.  None.   CODE STATUS: The patient is a full code.   TIME SPENT ON ADMISSION ORDERS AND PATIENT CARE:  Approximately 40 minutes.    ____________________________ Norva Riffle. Marcille Blanco, MD msd:nt D: 01/12/2014 17:21:41 ET T: 01/12/2014 17:57:50 ET JOB#: [id number]  cc: Norva Riffle. Marcille Blanco, MD, <Dictator> Norva Riffle Gwenn Teodoro MD ELECTRONICALLY SIGNED 01/16/2014 11:12

## 2014-05-12 NOTE — Op Note (Signed)
PATIENT NAME:  Kelsey Peterson, Kelsey Peterson MR#:  [id number] DATE OF BIRTH:  [birth year]  DATE OF PROCEDURE:  08/21/2013  PREOPERATIVE DIAGNOSES:   1.  End-stage renal disease.  2.  Nonfunctional, largely aneurysmal right arm arteriovenous fistula.   POSTOPERATIVE DIAGNOSES: 1.  End-stage renal disease.  2.  Nonfunctional, largely aneurysmal right arm arteriovenous fistula.   PROCEDURES: 1.  Ultrasound guidance for vascular access to right internal jugular vein.  2.  Fluoroscopic guidance for placement of catheter.  3.  Placement of a 19 cm tip-to-cuff tunneled hemodialysis catheter via the right internal jugular vein.   SURGEON:  Algernon Huxley, MD   ANESTHESIA: Local with sedation.   BLOOD LOSS: 25 mL.   INDICATION FOR PROCEDURE: A 59 year old African American female with end-stage renal disease. Her right arm AV fistula is markedly aneurysmal and this has gotten much larger, with skin threat, and it is no longer a usable access for dialysis. For this reason, we are asked to place a PermCath by her dialysis center.   DESCRIPTION OF THE PROCEDURE: The patient was brought to the vascular and interventional radiology suite. The patient's right neck and chest were sterilely prepped and draped and a sterile surgical field was created. The right internal jugular vein was visualized with ultrasound and found to be patent. It was then accessed under direct ultrasound guidance and a permanent image was recorded. A wire was placed. After a skin nick and dilatation, the peel-away sheath was placed over the wire.   I then turned my attention to an area under the clavicle. Approximately 2 fingerbreadths below the clavicle a small counter incision was created and we tunneled from the subclavicular incision to the access site. Using fluoroscopic guidance, a 19 cm tip-to-cuff tunneled hemodialysis catheter was selected, tunneled from the subclavicular incision to the access site. It was then placed through the  peel-away sheath and the peel-away sheath was removed. The catheter tips were parked in the right atrium. The appropriate distal connectors were placed. It withdrew blood well and flushed easily with heparinized saline and a concentrated heparin solution was then placed. It was secured to the chest wall with 2 Prolene sutures. The access incision was closed with a single 4-0 Monocryl. A 4-0 Monocryl pursestring suture was placed around the exit site. Sterile dressings were placed.   The patient tolerated the procedure well and was taken to the recovery room in stable condition.    ____________________________ Algernon Huxley, MD jsd:sk D: 08/21/2013 13:47:18 ET T: 08/22/2013 01:02:52 ET JOB#: [id number]  cc: Algernon Huxley, MD, <Dictator> Algernon Huxley MD ELECTRONICALLY SIGNED 08/29/2013 14:14

## 2014-05-12 NOTE — Op Note (Signed)
PATIENT NAME:  Kelsey Peterson, Kelsey Peterson MR#:  [id number] DATE OF BIRTH:  [birth year]  DATE OF PROCEDURE:  09/13/2013  PREOPERATIVE DIAGNOSES: 1.  Complication of arteriovenous access with painful fistula.  2.  Painful extremity.  3.  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  4.  End-stage renal disease requiring hemodialysis.   POSTOPERATIVE DIAGNOSES:  1.  Complication of arteriovenous access with painful fistula.  2.  Painful extremity.  3.  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  4.  End-stage renal disease requiring hemodialysis.   PROCEDURE PERFORMED: 1.  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  2.  Creation of a brachial axillary dialysis graft, right arm.   SURGEON: Katha Cabal, MD   ANESTHESIA:  General by LMA.   FLUIDS:  Per anesthesia record.   ESTIMATED BLOOD LOSS: 125 mL.   SPECIMEN: Resected aneurysm to pathology permanent section.   INDICATIONS: Ms. Cardy is a 59 year old woman who presents with painful arm and problems with dialysis secondary to her aneurysmal fistula. Initially she requested ligation with  catheter dependence. She has been convinced this would be imprudent and has secondarily opted to undergo resection with revision. The risks and benefits were reviewed. All questions answered. The patient has agreed to proceed. In conversation, the patient did note that she will sue if this new access becomes aneurysmal. The problems of early death, sepsis, and central venous stenosis were again described to the patient regarding catheter dependence. She wishes to proceed at this time.   DESCRIPTION OF PROCEDURE: The patient is taken to the operating room and placed in the supine position. After adequate general anesthesia is induced, appropriate invasive monitors are placed, she is positioned supine with her right arm extended palm upward. Right arm is prepped and draped in sterile fashion. Appropriate timeout  is called.   Incision is made through the previous scar and carried down to expose the very proximal portion of the AV fistula at the level of the anastomosis and this is dissected circumferentially.   A linear incision made along the anterior axillary line and the aneurysmal fistula is identified. The incision is then carried down to expose the axillary vein which is looped proximally and distally and tributaries are also looped.   In order to achieve tunneling, a segment of approximately 6 to 10 cm of the aneurysmal fistula is circumferentially dissected. It is then ligated proximally at the arterial using a towel, which is  rolled up. The blood is expunged from the fistula itself, and then it is ligated more proximally on the arm, and then ligated up at the top of the deltoid level. A segment between the proximal 2 ligatures is then resected.   Gore tunneling device is then passed subcutaneously and a 7 mm straight PTFE Propaten graft is pulled through the subcutaneous tunnel.   The arterial portion is then clamped with a profunda clamp at the suture line, transected, and a bevel put on the AV graft. End graft to end venous anastomosis is fashioned and flushing maneuvers are performed and flow was established back to the graft, which is flushed briefly. The graft is then irrigated and then clamped in the previously noted area.   The graft is then approximated to the axillary vein marked, axillary vein is controlled with the vessel loops. The graft is transected, beveled, and the venotomy created. End graft to side vein anastomosis is fashioned with running CV-6 suture. Flushing maneuvers are performed and the suture line is completed.  Flow was established through the AV graft. Excellent thrill was noted. Both incisions are then irrigated with sterile saline and closed in layers using running 3-0 Vicryl, followed by 4-0 Monocryl subcuticular.   Skin is closed with 4-0 Monocryl subcuticular and  Dermabond is applied. The patient tolerated the procedure well. There were no immediate complications. Sponge and needle counts are correct and she is taken to the recovery area in excellent condition.   ____________________________ Katha Cabal, MD ggs:LT D: 09/13/2013 18:08:08 ET T: 09/13/2013 23:49:55 ET JOB#: [id number]  cc: Katha Cabal, MD, <Dictator> Katha Cabal MD ELECTRONICALLY SIGNED 09/26/2013 22:39

## 2014-05-12 NOTE — Consult Note (Signed)
General Aspect catheter sepsis   Present Illness The patietn is a very noncooperative 59 year old female on dialysis, who was admitted December 24 with uremia and volume overload. She had missed 2 days of dialysis. She also had been having some fever and malaise. She then developed diarrhea and abdominal pain. She also reports she has been having difficulty walking and leg weakness. The patient was admitted when her family did not hear from her, and they found her with stool incontinence and too weak to stand on her own.  On admission she was felt to be dehydrated and uremic with a creatinine of 16. She has been on dialysis and has clinically improved in terms of her mentation. However, she has had bacteremia with multiple blood cultures growing MSSA. She, however, has an increase in white count up to 40,000. She complains today of abdominal pain. Her diarrhea has stopped.  She had undergone surgery in August for complication of her fistula in the right upper extremity. 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  However, she refuses to let the center cannulate the right arm agraft and therefore has forced the issue of a catheter.  She does also have a right IJ tunneled catheter in place.  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   SURGICAL HISTORY: Right upper extremity fistula placement and right internal jugular vascular catheter placement.   Home Medications: Medication Instructions Status  fexofenadine 30 mg oral tablet, disintegrating 1  orally 2 times a day Active  ferrous sulfate 325 mg (65 mg elemental iron) oral tablet 1 tab(s) orally once a day Active  Dialyvite Supreme D oral tablet 1 tab(s) orally once a day Active  amLODIPine 5 mg oral tablet 1 tab(s) orally once a day Active  Nexium 40  mg oral delayed release capsule 1 cap(s) orally once a day Active  calcium carbonate 500 mg oral tablet, chewable 1 tab(s) orally 3 times a day (after meals) Active  ProAir HFA CFC free 90 mcg/inh inhalation aerosol 2 puff(s) inhaled every 4 hours, As Needed - for Shortness of Breath Active  furosemide 80 mg oral tablet 1 tab(s) orally once a day on Monday, Wednesday, Friday, and Sunday Active  Imdur 30 mg oral tablet, extended release 2 tab(s) orally once a day Active  Renvela 800 mg oral tablet 1 tab(s) orally 3 times a day (with meals) and 1 tablet with snacks Active  lidocaine-prilocaine 2.5%-2.5% topical cream Apply topically to affected area once a day (in the morning) prior to treatment on Tuesday, Thursday, and Saturday Active    Morphine Sulfate: Rash, Hives  IVP Dye: Itching  "some BP med"  pt doesn't know which one. Caused swelling of the face: Unknown  Case History:  Family History Non-Contributory   Social History negative tobacco, negative ETOH, negative Illicit drugs   Review of Systems:  Fever/Chills No   Cough No   Sputum No   Abdominal Pain Yes   Diarrhea No   Constipation No   Nausea/Vomiting No   SOB/DOE No   Chest Pain No   Telemetry Reviewed NSR   Dysuria No   Physical Exam:  GEN well developed, well nourished, obese   HEENT dry oral mucosa, poor dentition, hard of hearing   NECK supple  trachea midline   RESP normal  resp effort  no use of accessory muscles   CARD regular rate  no JVD   VASCULAR ACCESS Dialysis catheter present  -- Purulent drainage   ABD denies tenderness  soft   EXTR negative cyanosis/clubbing, negative edema   SKIN No rashes, No ulcers   NEURO cranial nerves deficit, motor/sensory function intact, hard of hearing   PSYCH alert, poor insight   Nursing/Ancillary Notes: **Vital Signs.:   30-Dec-15 08:03  Vital Signs Type Q 8hr  Temperature Temperature (F) 98.8  Celsius 37.1  Pulse Pulse 86  Respirations  Respirations 19  Systolic BP Systolic BP 144  Diastolic BP (mmHg) Diastolic BP (mmHg) 90  Mean BP 111  Pulse Ox % Pulse Ox % 94  Pulse Ox Activity Level  At rest  Oxygen Delivery 2L   TDMs:  30-Dec-15 09:39   Vancomycin, Random 25 (Result(s) reported on 17 Jan 2014 at 10:42AM.)  LabUnknown:  30-Dec-15 05:16   Result Interpretation Blood smear reviewed. Platelets are adequate. RBCs show significant microcytosis and prominent target cells. There is marked neutrophilic leukocytosis with myelocytes, favor reactive. Further evaluation for iron deficiency and possible thalassemia may be helpful. Blain Pais, MD.  Routine Chem:  30-Dec-15 05:16   Result Comment PLATELET - FIBRIN STRANDS SEEN ON SMEAR. THIS MAY  - AFFECT THE PLATELET COUNT. THE ACTUAL  - NUMERICAL COUNT MAY BE SOMEWHAT HIGHER  - THAN THE REPORTED VALUE. CBC - PATHOLOGIST TO REVIEW SMEAR. COMMENTS  - APPEAR ON REPORT WHEN COMPLETE.  Result(s) reported on 17 Jan 2014 at 07:23AM.  Glucose, Serum  115  BUN  34  Creatinine (comp)  5.63  Sodium, Serum 138  Potassium, Serum 4.0  Chloride, Serum 101  CO2, Serum 29  Calcium (Total), Serum  8.2  Anion Gap 8  Osmolality (calc) 284  eGFR (African American)  10  eGFR (Non-African American)  8 (eGFR values <37m/min/1.73 m2 may be an indication of chronic kidney disease (CKD). Calculated eGFR, using the MRDR Study equation, is useful in  patients with stable renal function. The eGFR calculation will not be reliable in acutely ill patients when serum creatinine is changing rapidly. It is not useful in patients on dialysis. The eGFR calculation may not be applicable to patients at the low and high extremes of body sizes, pregnant women, and vegetarians.)  Routine Hem:  30-Dec-15 05:16   WBC (CBC)  47.4  RBC (CBC)  2.98  Hemoglobin (CBC)  7.4  Hematocrit (CBC)  23.0  Platelet Count (CBC) 202  MCV  77  MCH  24.7  MCHC 32.1  RDW  20.3  Bands 1  Segmented Neutrophils 83   Lymphocytes 4  Monocytes 4  Metamyelocyte 3  Myelocyte 5  Diff Comment 1 ANISOCYTOSIS  Diff Comment 2 POIKILOCYTOSIS  Diff Comment 3 HYPOCHROMIA  Diff Comment 4 MICROCYTES PRESENT  Diff Comment 5 MICROCYTES PRESENT  Diff Comment 6 TARGET CELLS  Diff Comment 7 PLTS VARIED IN SIZE  Result(s) reported on 17 Jan 2014 at 07:23AM.   CT:    25-Dec-15 15:06, CT Head Without Contrast  CT Head Without Contrast   REASON FOR EXAM:    unable to walk.  COMMENTS:       PROCEDURE: CT  - CT HEAD WITHOUT CONTRAST  - Jan 12 2014  3:06PM     CLINICAL DATA:  Unable to walk for 3-4 days, less power in BILATERAL  lower extremities, dehydration, weakness, end-stage renal disease on  hemodialysis but missed last 2 hemodialysis appointments  EXAM:  CT HEAD WITHOUT CONTRAST    TECHNIQUE:  Contiguous axial images were obtained from the base of the skull  through the vertex without intravenous contrast.  COMPARISON:  12/05/2012    FINDINGS:  Generalized atrophy.    Normal ventricular morphology.    No midline shift or mass effect.    Small vessel chronic ischemic changes of deep cerebral white matter.    No intracranial hemorrhage, mass lesion, or acute infarction.    Visualized paranasal sinuses and mastoid air cells clear.  Bones unremarkable.     IMPRESSION:  Atrophy with small vessel chronic ischemic changes in deep cerebral  white matter.    No acute intracranial abnormalities.      Electronically Signed    By: Lavonia Dana M.D.    On: 01/12/2014 15:13         Verified By: Burnetta Sabin, M.D.,    Impression *bacteremia- staph aureus all 3 cultures.    started on vancomycin    Sensitivity report shows MSSA- Vanc changed to Cefazolin.    Given catheter is present it is the most likely source of recurrent septicemia and should be removed     The risks benefits and alternative were discussed with the patient and she agrees to have it removed  *  Abdominal pain-- assoc w/  diarrhea;   generalized pain as well. Afebrile.  Likely viral etiology . wanted to Check Cdiff- no more diarrhea for 48 hrs after admisison   so it is not c diff.    have some nausea- symptomatic management with zofran.   resolved now,.  * Unable to walk- Very less power b/l Lower limbs    negative CT head , likely due to dehydration and weakness.   STR per PT eval. once Blood cx negative.  * ESRD on HD    missed last 2 HD- have uremia,HD done by Dr. Holley Raring.  * Dehydration-- mild, Oral mucosa dry ; gently hydrate.  * HTN-- controlled at this time.  Cont amlodipine and Imdur.   Plan consult level 3   Electronic Signatures: Hortencia Pilar (MD)  (Signed 30-Dec-15 22:45)  Authored: General Aspect/Present Illness, Home Medications, Allergies, History and Physical Exam, Vital Signs, Labs, Radiology, Impression/Plan   Last Updated: 30-Dec-15 22:45 by Hortencia Pilar (MD)

## 2014-05-13 NOTE — Consult Note (Signed)
Present Illness The patient is a 59 year old woman with past medical history significant for hypertension, endstage renal disease on Tuesday, Thursday, Saturday hemodialysis and chronic obstructive pulmonary disease, presents to the hospital by EMS secondary to fever and generalized weakness that started several days ago.  She had been doing well up until Thursday evening when she started to have some chills and coughed  this progressed until she could not even get out of bed because of extreme weakness and body aches. She was brought [facility] by EMS.  She has an AV fistula in her right arm, and also she has a right chest PermCath which is the one which is being used for dialysis. The blood cultures are positive.  PAST MEDICAL HISTORY:  1. End-stage renal disease on Tuesday, Thursday, and Saturday hemodialysis by right-sided PermCath. Also right arm AV fistula is present.  2. Hypertension.  3. 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  4. Gastroesophageal reflux disease.  5. Chronic obstructive pulmonary disease, not on any home oxygen.  6. Anemia of chronic disease.   Home Medications: Medication Instructions Status  amlodipine tablet 5 mg 1 tab(s) orally once a day (in the morning) Active  Dilaudid 2 mg oral tablet 1 tab(s) orally every 6 hours, As Needed Active  ferrous sulfate 325 mg (65 mg elemental iron) oral tablet 1 tab(s) orally 2 times a day Active  hydrALAZINE 100 mg oral tablet 1 tab(s) orally 3 times a day Active  Imdur 30 mg oral tablet, extended release 1 tab(s) orally once a day (in the morning) Active  Nexium 40 mg oral delayed release capsule 1 cap(s) orally once a day (in the morning) Active  Vitamin D 50,000 intl units (1.25 mg) oral capsule 1 cap(s) orally once a month Active  ProAir HFA 2 puff(s) inhaled every 4 hours, As Needed Active  Renvela 800 mg oral tablet 2 tab(s) orally 3 times a day (with meals) and 1 tab with snacks. Active  Percocet 5/325  oral tablet 1-2 tab(s) orally every 6 hours, As Needed- for Pain  Active  Lasix 80 mg oral tablet tab(s) orally Monday, Wednesday, and Friday Active  Emla 2.5%-2.5% topical cream Apply topically to affected area once Active    Morphine Sulfate: Rash, Hives  "some BP med"  pt doesn't know which one. Caused swelling of the face: Unknown  Case History:   Family History Non-Contributory    Social History negative tobacco, negative ETOH, negative Illicit drugs   Review of Systems:   Fever/Chills Yes    Cough Yes    Sputum No    Abdominal Pain No    Diarrhea No    Constipation No    Nausea/Vomiting No    SOB/DOE No    Chest Pain No    Telemetry Reviewed NSR    Dysuria No   Physical Exam:   GEN well developed, obese, ill appearing    HEENT PERRL, good dentition, patient is hard of hearing but reads lips well    NECK supple  trachea midline    RESP normal resp effort  no use of accessory muscles    CARD regular rate  No LE edema  no JVD    VASCULAR ACCESS AV fistula present  Dialysis catheter present  Good bruit  Good thrill  Tenderness around access site  fitula is pulsatile    ABD denies tenderness  soft  obese nondistended    SKIN normal to palpation, No rashes  NEURO follows commands, motor/sensory function intact    PSYCH alert, good insight   Nursing/Ancillary Notes: **Vital Signs.:   13-Jul-13 05:40   Vital Signs Type Routine   Temperature Temperature (F) 99.4   Celsius 37.4   Temperature Source Oral   Pulse Pulse 94   Respirations Respirations 18   Systolic BP Systolic BP 174   Diastolic BP (mmHg) Diastolic BP (mmHg) 73   Mean BP 86   Pulse Ox % Pulse Ox % 92   Pulse Ox Activity Level  At rest   Oxygen Delivery Room Air/ 21 %   Hepatic:  12-Jul-13 16:30    Bilirubin, Total 0.5   Alkaline Phosphatase  160   SGPT (ALT) 35 (12-78 NOTE: NEW REFERENCE RANGE 12/12/2010)   SGOT (AST)  59   Total Protein, Serum 8.0   Albumin, Serum 3.5   Routine Micro:  12-Jul-13 16:30    Organism Name [place]   Organism Name [place]   Micro Text Report BLOOD CULTURE   ORGANISM 1                GRAM POSITIVE COCCI   COMMENT                   ID TO FOLLOW   COMMENT                   IN AEROBIC AND ANAEROBIC BOTTLES   GRAM STAIN                GRAM POSITIVE COCCI IN CLUSTERS   ANTIBIOTIC               Micro Text Report BLOOD CULTURE   ORGANISM 1                GRAM POSITIVE COCCI   COMMENT                   ID TO FOLLOW   COMMENT                   IN AEROBIC AND ANAEROBIC BOTTLES   GRAM STAIN                GRAM POSITIVE COCCI IN CLUSTERS   ANTIBIOTIC               Organism 1 GRAM POSITIVE COCCI   Organism 1 GRAM POSITIVE COCCI   Culture Comment ID TO FOLLOW   Culture Comment ID TO FOLLOW   Culture Comment . IN AEROBIC AND ANAEROBIC BOTTLES   Culture Comment . IN AEROBIC AND ANAEROBIC BOTTLES   Gram Stain 1 GRAM POSITIVE COCCI IN CLUSTERS   Gram Stain 1 GRAM POSITIVE COCCI IN CLUSTERS  Lab:  12-Jul-13 16:50    Lactic Acid, Cardiopulmonary  1.8 (Result(s) reported on 31 Jul 2011 at 04:56PM.)  Routine Chem:  12-Jul-13 16:30    Glucose, Serum 81   BUN  49   Creatinine (comp)  7.82   Sodium, Serum 136   Potassium, Serum  6.0   Chloride, Serum 99   CO2, Serum 25   Calcium (Total), Serum  8.3   Anion Gap 12   Osmolality (calc) 284   eGFR (African American)  6   eGFR (Non-African American)  5 (eGFR values <50m/min/1.73 m2 may be an indication of chronic kidney disease (CKD). Calculated eGFR is useful in patients with stable renal function. The eGFR calculation will not be reliable in  acutely ill patients when serum creatinine is changing rapidly. It is not useful in  patients on dialysis. The eGFR calculation may not be applicable to patients at the low and high extremes of body sizes, pregnant women, and vegetarians.)   Result Comment BLOOD CULTURE(AEROBIC) - NOTIFIED OF CRITICAL VALUE  - LAB  CALLED TO TRIC[facility] KING AT 7654  - 08/01/11  - READ-BACK PROCESS PERFORMED. BLOOD CULTURE(ANAEROBIC) - CRITICAL VALUE PREVIOUSLY NOTIFIED.  - 08/01/11 AT 6503 LAB  Result(s) reported on 01 Aug 2011 at 08:34AM.   Result Comment BLOOD CULTURE(AEROBIC) - NOTIFIED OF CRITICAL VALUE  - LAB CALLED TO TRICIA KING AT 5465  - 08/01/11  - READ-BACK PROCESS PERFORMED. BLOOD CULTURE(ANAEROBIC) - CRITICAL VALUE PREVIOUSLY NOTIFIED.  - 08/01/11 AT 6812 LAB  Result(s) reported on 01 Aug 2011 at 08:35AM.   Result Comment POTASSIUM/AST - Slight hemolysis, interpret results with  - caution.  Result(s) reported on 31 Jul 2011 at 05:33PM.  Cardiac:  12-Jul-13 16:20    CPK-MB, Serum  < 0.5 (Result(s) reported on 31 Jul 2011 at 08:26PM.)   Troponin I < 0.02 (0.00-0.05 0.05 ng/mL or less: NEGATIVE  Repeat testing in 3-6 hrs  if clinically indicated. >0.05 ng/mL: POTENTIAL  MYOCARDIAL INJURY. Repeat  testing in 3-6 hrs if  clinically indicated. NOTE: An increase or decrease  of 30% or more on serial  testing suggests a  clinically important change)  Routine UA:  12-Jul-13 16:30    Color (UA) Yellow   Clarity (UA) Clear   Glucose (UA) Negative   Bilirubin (UA) Negative   Ketones (UA) Negative   Specific Gravity (UA) 1.011   Blood (UA) Negative   pH (UA) 8.0   Protein (UA) 100 mg/dL   Nitrite (UA) Negative   Leukocyte Esterase (UA) Negative (Result(s) reported on 31 Jul 2011 at 05:23PM.)   RBC (UA) 5 /HPF   WBC (UA) <1 /HPF   Bacteria (UA) NONE SEEN   Epithelial Cells (UA) 1 /HPF   Mucous (UA) PRESENT (Result(s) reported on 31 Jul 2011 at 05:23PM.)  Routine Hem:  12-Jul-13 16:30    WBC (CBC)  20.8   RBC (CBC) 4.22   Hemoglobin (CBC)  10.8   Hematocrit (CBC)  34.3   Platelet Count (CBC) 339 (Result(s) reported on 31 Jul 2011 at 05:14PM.)   MCV 81   MCH  25.7   MCHC  31.6   RDW  17.9     Impression 1. Systemic inflammatory response syndrome with fever, tachycardia, leukocytosis                        broad-spectrum antibiotics with vancomycin and Zosyn at this time.                        Her blood cultures are positive and she has a functioning fistula                       Her catheter will need to be removed                        Her fistula was placed months ago is is certainly ready to use it is somewhat pulsatile and will likely need either an ultrasound or fistulagram in the long term. 2. Altered mental status, likely metabolic encephalopathy secondary to infection  CT head is negative                         mentating better this am.  3. End-stage renal disease with hyperkalemia.                        Patient is on hemodialysis on Tuesday, Thursday, and Saturday schedule.                        Nephrology is on consult .   4. Hypertension.                          Initially the blood pressure medications are held                         changes per medical service   5. Slightly elevated liver function tests.                             Check ultrasound of the abdomen today.  6. Anemia of chronic kidney disease,                          appears stable continue procrit as ordered. 7. Chronic obstructive pulmonary disease.                           Nebulizers p.r.n.  8. Gastroesophageal reflux disease.                           continue ppi    Plan level 4 consult   Electronic Signatures: Hortencia Pilar (MD)  (Signed 13-Jul-13 12:17)  Authored: General Aspect/Present Illness, Home Medications, Allergies, History and Physical Exam, Vital Signs, Labs, Impression/Plan   Last Updated: 13-Jul-13 12:17 by Hortencia Pilar (MD)

## 2014-05-13 NOTE — Op Note (Signed)
PATIENT NAME:  Kelsey Peterson, Kelsey Peterson MR#:  [id number] DATE OF BIRTH:  1955-09-24  DATE OF PROCEDURE:  03/02/2011  PREOPERATIVE DIAGNOSES:  1. End-stage renal disease.  2. Poorly functioning right arm arteriovenous fistula.  3. Deafness.  4. Hypertension.   POSTOPERATIVE DIAGNOSES:  1. End-stage renal disease.  2. Poorly functioning right arm arteriovenous fistula.  3. Deafness.  4. Hypertension.   PROCEDURES:  1. Ultrasound guidance for vascular access, right arm arteriovenous fistula.  2. Right upper extremity fistulogram and central venogram.   SURGEON: Algernon Huxley, MD   ANESTHESIA: Local with moderate conscious sedation.   ESTIMATED BLOOD LOSS: Minimal.   FLUOROSCOPY TIME: 1.2 minutes.   CONTRAST USED: 25 mL.   INDICATION FOR PROCEDURE: The patient is a 59 year old African American female with end-stage renal disease. They apparently have had difficulty accessing her fistula in her right arm and have requested a fistulogram. The risks and benefits were discussed. Informed consent was obtained.   DESCRIPTION OF PROCEDURE: The patient was brought to the Vascular Interventional Radiology Suite. The right upper extremity was sterilely prepped and draped, and a sterile surgical field was created. An area a few centimeters beyond the anastomosis in a large aneurysmal portion of the fistula was accessed. This was accessed under direct ultrasound guidance without difficulty with a Micropuncture needle, and permanent image was recorded. A micropuncture wire and sheath were then placed. Imaging through the micropuncture sheath was performed. For further evaluation, we placed a J-wire and a Kumpe catheter to be able to cross an area of tortuosity and splay open obliquities, and upsized to a 6-French sheath. Imaging reveals tortuosity and aneurysmal formation of the AV fistula without significant stenosis. There is an area between two aneurysmal segments that relatively sized is smaller but this  is not stenotic. There is a very mild stenosis at what appears to be the venous access point when marked, but this is not more than 20%. It also appears that the arterial access site is within an area of a prominent branch that then redrains into the cephalic vein, almost causing a split system in this area. This may explain the reasoning for difficulty with cannulation, but I am not sure. The remainder of the fistula is widely patent and drains largely through the basilic vein in the upper arm, and the central venous circulation is widely patent. At this point, a 4-0 Monocryl pursestring suture was placed and the sheath was removed. Pressure was held and a sterile dressing was placed. The patient tolerated the procedure well.   ____________________________ Algernon Huxley, MD jsd:cbb D: 03/02/2011 14:29:50 ET T: 03/02/2011 14:54:11 ET JOB#: [id number]  cc: Algernon Huxley, MD, <Dictator> Munsoor Lilian Kapur, MD Murlean Iba, MD Lavera Guise, MD Algernon Huxley MD ELECTRONICALLY SIGNED 03/11/2011 15:42

## 2014-05-13 NOTE — Op Note (Signed)
PATIENT NAME:  Kelsey Peterson, Kelsey Peterson MR#:  [id number] DATE OF BIRTH:  12/04/1955  DATE OF PROCEDURE:  01/23/2011  PREOPERATIVE DIAGNOSIS:  1. Complication arteriovenous dialysis fistula, right arm, with failure to cannulate.  2. End-stage renal disease requiring hemodialysis.   POSTOPERATIVE DIAGNOSIS:  1. Complication arteriovenous dialysis fistula, right arm, with failure to cannulate. 2. End-stage renal disease requiring hemodialysis.   PROCEDURES PERFORMED: Contrast injection right arm radiocephalic fistula.   SURGEON: Hortencia Pilar, MD   SEDATION: Versed 4 mg plus fentanyl 100 mcg administered IV. Continuous ECG, pulse oximetry and cardiopulmonary monitoring was performed throughout the entire procedure by the Interventional Radiology nurse. Total sedation time was 30 minutes.   ACCESS: 6-French sheath, right AV fistula.   CONTRAST USED: Isovue 30 mL.   FLUOROSCOPY TIME: Approximately 0.5 minutes.   INDICATIONS: Ms. Hadsall presented to the office with several episodes of difficulty with cannulation. The risks and benefits were reviewed for fistulogram possible intervention. All questions are answered. The patient has agreed to proceed.   DESCRIPTION OF PROCEDURE: The patient is brought to Special procedures and placed in the supine position. After adequate sedation is achieved, the right arm is extended palm upward and prepped and draped in a sterile fashion.   One percent lidocaine is infiltrated into the soft tissues near the arterial anastomosis, and access to the fistula is obtained with a micropuncture needle, Microwire, followed by micro sheath, J-wire followed by a 6-French sheath. Hand injection of contrast is then used to demonstrate the fistula. After review of the images, a pursestring suture of 4-0 Monocryl is placed around the sheath. The sheath is removed, and there are no immediate complications.   INTERPRETATION: Initial images of the fistula near the anastomosis show  a fairly large aneurysmal segment which appears to have a dumbbell-like shape. However, the space between the two bulbous areas is never thoroughly defined.  I cannot be sure that there is not a stricture in this area; however, contrast flows very rapidly through this portion and up the fistula. The cephalic vein does cross the antecubital fossa into a large basilic vein which then is widely patent up to the axillary vein. The basilic vein and the more proximal cephalic vein measure 12 to 14 mm in diameter. The cephalic vein in the upper arm is patent. It is, unfortunately, very tortuous and already demonstrates an aneurysmal segment. The axillary, subclavian, innominate, and superior vena cava are all widely patent.   SUMMARY: It appears to be a widely patent fistula which is actually quite large, larger than average. I am uncertain as to why they would have difficulty with cannulations. The areas of the two cannulation sites are widely patent and free of any sort of stricture or stenosis.  ____________________________ Katha Cabal, MD ggs:cbb D: 01/23/2011 13:58:31 ET T: 01/23/2011 15:09:32 ET JOB#: [id number]  cc: Katha Cabal, MD, <Dictator> Katha Cabal MD ELECTRONICALLY SIGNED 02/07/2011 12:28

## 2014-05-13 NOTE — Op Note (Signed)
PATIENT NAME:  Kelsey Peterson, LASKY MR#:  [id number] DATE OF BIRTH:  07-Nov-1955  DATE OF PROCEDURE:  04/27/2011  PREOPERATIVE DIAGNOSES:  1. End-stage renal disease.  2. Aneurysmal right arm AV fistula with plan for ligation of AV fistula and new access creation later this week.  3. Deafness.  4. Hypertension.   POSTOPERATIVE DIAGNOSES:  1. End-stage renal disease.  2. Aneurysmal right arm AV fistula with plan for ligation of AV fistula and new access creation later this week.  3. Deafness.  4. Hypertension.   PROCEDURES:  1. Ultrasound guidance for vascular access, right jugular vein.  2. Fluoroscopic guidance for placement of catheter.  3. Placement of a 19 cm tip to cuff tunneled hemodialysis catheter.   SURGEON: Algernon Huxley, MD  ANESTHESIA: Local with moderate conscious sedation.   ESTIMATED BLOOD LOSS: 25 mL.  FLUOROSCOPY TIME: Less than minute.   CONTRAST USED: No contrast was used.   INDICATION FOR PROCEDURE: 59 year old [place] American female well known to [place] for her dialysis access needs. She has a widely patent right radiocephalic AV fistula with aneurysm and generation of the fistula not far from the anastomosis and although the fistula works well it is painful to her and she desires to have this removed. A new fistula will be planned but she will need a PermCath for several weeks while this is maturing. She is brought in today for the PermCath placement preliminary to her new fistula placement and existing fistula ligation. Risks and benefits are discussed. Informed consent was obtained.   DESCRIPTION OF PROCEDURE: Patient is brought to the vascular interventional radiology suite. Right neck and chest were sterilely prepped and draped, a sterile surgical field was created. The right jugular vein was visualized with ultrasound and found to widely patent. It was then accessed under direct ultrasound guidance without difficulty with a Seldinger needle and a J-wire was placed  after skin nick and dilatation. The peel-away sheath was placed over the wire and the wire and dilator were removed. I then anesthetized an area two fingerbreadths below the right clavicle, tunneled from the subclavicular incision to the access site using fluoroscopic guidance. I selected a 19 cm tip to cuff tunneled hemodialysis catheter. This was tunneled, placed through the peel-away sheath, the catheter tips were placed into the atrium and the cuff was between the access site and the exit site and there were no kinks within the catheter. The appropriate distal connectors were placed. The catheter withdrew blood well and flushed easily with heparinized saline and then a concentrated heparin solution was then placed. It was secured to the chest wall with two Prolene sutures. A 4-0 Monocryl pursestring suture was placed around the exit site and a 4-0 suture was used to close skin incision. Sterile dressing was placed. The patient tolerated procedure well and was taken to the recovery room in stable condition.   ____________________________ Algernon Huxley, MD jsd:cms D: 04/27/2011 12:29:47 ET T: 04/27/2011 16:51:06 ET JOB#: [id number]  cc: Algernon Huxley, MD, <Dictator> Algernon Huxley MD ELECTRONICALLY SIGNED 04/30/2011 12:01

## 2014-05-13 NOTE — Op Note (Signed)
PATIENT NAME:  Kelsey Peterson, Kelsey Peterson MR#:  [id number] DATE OF BIRTH:  Jul 13, 1955  DATE OF PROCEDURE:  08/01/2011  PREOPERATIVE DIAGNOSES:  1. Sepsis secondary to tunneled dialysis catheter.  2. Complication dialysis device.  3. End-stage renal disease.   POSTOPERATIVE DIAGNOSES:  1. Sepsis secondary to tunneled dialysis catheter.  2. Complication dialysis device.  3. End-stage renal disease.   PROCEDURE PERFORMED: Removal of tunneled catheter. Culture of the tips.   SURGEON: Katha Cabal, MD  DESCRIPTION OF PROCEDURE: Patient is in her hospital bed in her hospital room. She is supine. The right chest wall and catheter are prepped and draped in sterile fashion. 1% lidocaine with epinephrine is infiltrated in the soft tissues surrounding the palpable cuff. Small transverse incision is created with an 11 blade scalpel. Dissection is carried down to expose the catheter, which is grasped with a hemostat. Cuff is then freed from surrounding tissues. Once this has been accomplished circumferentially the catheter is then transected. The hub assembly is removed and the catheter with the cuff is pulled out as well. Pressure is held at the base of the neck for five minutes and subsequently 3-0 Vicryl suture is used to closed the tunnel and then the skin is reapproximated using 3-0 Vicryl subcuticular. Dermabond is placed over the wound and a sterile dressing placed over the exit site. Patient tolerated procedure well and there were no immediate complications.    ____________________________ Katha Cabal, MD ggs:cms D: 08/01/2011 17:01:36 ET T: 08/02/2011 10:36:19 ET JOB#: [id number] cc: Katha Cabal, MD, <Dictator> Munsoor Lilian Kapur, MD Katha Cabal MD ELECTRONICALLY SIGNED 08/11/2011 13:01

## 2014-05-13 NOTE — H&P (Signed)
PATIENT NAME:  Kelsey Peterson, WIRICK MR#:  [id number] DATE OF BIRTH:  1955/10/17  DATE OF ADMISSION:  07/31/2011  ADMITTING PHYSICIAN: Gladstone Lighter, MD PRIMARY CARE PHYSICIAN: Dr. Clayborn Bigness. PRIMARY NEPHROLOGIST: Anthonette Legato, MD  CHIEF COMPLAINT: Confusion and weakness  HISTORY OF PRESENT ILLNESS: Kelsey Peterson is a 59 year old African American female with past medical history significant for hypertension, endstage renal disease on Tuesday, Thursday, Saturday hemodialysis and chronic obstructive pulmonary disease, presents to the hospital by EMS secondary to fever and generalized weakness that started since yesterday. Patient is confused and is having trouble providing clear history of present illness. Daughter not available by telephone. Patient lives at home by herself, and daughter lives close by and kind of checks on her. Patient had her last dialysis yesterday and has been doing well up until last evening since when she started to have some chills, coughed all night until this morning and could not even get out of bed because of extreme weakness and body aches. So she was brought in by EMS to the hospital. Her urine does not show any source of infection. Chest x-ray is clear. It could be possibly catheter-related bacteremic infection; however, blood cultures are pending at this time. She has an AV fistula in her right arm, and also she has a right chest PermCath which is the one which is being used for dialysis.   PAST MEDICAL HISTORY:  1. End-stage renal disease on Tuesday, Thursday, and Saturday hemodialysis by right-sided PermCath. Also right arm AV fistula is present.  2. Hypertension.  3. 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  4. Gastroesophageal reflux disease.  5. Chronic obstructive pulmonary disease, not on any home oxygen.  6. Anemia of chronic disease.   ALLERGIES TO MEDICATIONS: Allergic to morphine and unknown blood pressure medication.   PAST  SURGICAL HISTORY:  1. Right arm AV fistula placed a few times after fistulograms from prior strictures.  2. Tubal ligation.   MEDICATIONS AT HOME:  1. Norvasc 5 mg p.o. daily.  2. Renvela 800 mg 2 tablets t.i.d. with meals and 1 tablet b.i.d. with snacks.  3. Dialyvite multivitamin 1 tablet p.o. daily.  4. Nexium 40 mg p.o. daily.  5. Hydralazine 100 mg p.o. t.i.d.  6. Imdur 30 mg p.o. daily.  7. Ferrous sulfate 325 mg p.o. daily.  8. EMLA Cream 30 minutes prior to dialysis at access site.  9. Lasix 80 mg on nondialysis days.  10. Sensipar 30 mg p.o. daily.   SOCIAL HISTORY: Lives at home by herself, daughter lives close by. No history of any smoking or alcohol use.   FAMILY HISTORY: Father with stroke and mother with diabetes. Both are deceased and brother on dialysis with kidney problems.   REVIEW OF SYSTEMS: CONSTITUTIONAL: Positive for weakness and fever. EYES: No blurred vision or double vision. No glaucoma or cataracts. ENT: Positive for hearing loss in the left ear. No tinnitus or ear pain, no discharge or epistaxis.  RESPIRATORY: Positive for chronic obstructive pulmonary disease and also dry cough. No hemoptysis or wheezing. CARDIOVASCULAR: No chest pain, orthopnea, edema, arrhythmia, palpitations, or syncope.  GI: No nausea, vomiting, diarrhea, abdominal pain, hematemesis, or melena. GENITOURINARY: Patient is oliguric secondary to being on dialysis. No hematuria or dysuria.  ENDOCRINE: No polyuria, nocturia, thyroid problems, heat or cold intolerance. HEMATOLOGY: No anemia, easy bruising or bleeding. SKIN: No acne, rash, or lesions. MUSCULOSKELETAL: No neck, back, shoulder pain, arthritis, or gout. NEUROLOGIC: No numbness, weakness, cerebrovascular accident,  transient ischemic attack, or seizures. PSYCHOLOGICAL: The patient is confused but no anxiety or depression.   PHYSICAL EXAMINATION:  VITAL SIGNS: Temperature of 100.7 degrees Fahrenheit, pulse up to 110, respirations 22, blood  pressure 134/70, pulse oximetry 94% on room air.   GENERAL: Heavily-built, well-nourished female lying in bed, not in any acute distress.   HEENT: Normocephalic, atraumatic. Pupils equal, round, reacting to light. Anicteric sclerae. Extraocular movements intact. Oropharynx: Dry mucous membranes. No erythema, mass, or exudates.   NECK: Supple. No thyromegaly, JVD, or carotid bruits. No lymphadenopathy.   LUNGS: Moving air bilaterally. Fine bibasilar crackles and diffuse scattered wheeze. No use of accessory muscles for breathing.    CARDIOVASCULAR: S1, S2.  regular rate and rhythm. No murmurs, rubs, or gallops.   ABDOMEN: Obese, soft, nontender, nondistended. No hepatosplenomegaly. Normal bowel sounds.   EXTREMITIES: No pedal edema. No clubbing or cyanosis.  2+ dorsalis pedis pulses palpable bilaterally.   SKIN: No acne, rash, or lesions.   LYMPHATICS: No cervical lymphadenopathy.   NEUROLOGIC: Cranial nerves intact. Able to move all four extremities.   PSYCHOLOGICAL: The patient is awake, oriented x3 but has intermittent periods of confusion with loss of focus at times.   LABORATORY DATA:  1. WBC 20.8, hemoglobin 10.8, hematocrit 34.3, platelet count 339,000.  2. Sodium 136, potassium 6.0, chloride 99, bicarbonate 24, BUN 49, creatinine 7.8, glucose 81, and calcium 8.3.  3. ALT 35, AST 59, alkaline phosphatase 160, total bilirubin 0.7, albumin 3.5. 4. Urinalysis negative for any infection.  5. Chest x-ray suggesting pulmonary interstitial edema, likely of cardiac cause. No alveolar pneumonia is seen. Lactic acid elevated at 1.8.  6. EKG showing sinus tachycardia, heart rate of 122.   ASSESSMENT AND PLAN: This is a 59 year old female with past medical history significant for end-stage renal disease on dialysis, hypertension, gastroesophageal reflux disease admitted for confusion, fever, and lethargy.  1. Systemic inflammatory response syndrome with fever, tachycardia, leukocytosis,  and altered mental status, could be catheter related blood stream infection. Chest x-ray does not show any other infiltrate, and urinalysis is negative. Blood cultures have been drawn from periphery and ordered 20more set from the PermCath. Started her on broad-spectrum antibiotics with vancomycin and Zosyn at this time. Also CT head without contrast for the acute onset of confusion, could be related to the underlying infection.  2. Altered mental status, likely metabolic encephalopathy secondary to infection as mentioned.  However, CT head has been ordered.  3. End-stage renal disease with hyperkalemia. Patient is on hemodialysis on Tuesday, Thursday, and Saturday schedule. She is due for dialysis tomorrow. Nephrology consult for the same.  Continue Renvela and also will give 1 dose of Lasix because it is her nondialysis day today.  4. Hypertension. Hold her blood pressure medications as her blood pressure is within normal limits and with her systemic inflammatory response syndrome she could drop to become hypotensive. Her Imdur, Norvasc, d hydralazine are on hold at this time and adjust medications as needed.  5. Slightly elevated liver function tests.  Her alkaline phosphatase and AST are elevated, . Check ultrasound of the abdomen in the a.m.  6. Anemia of chronic kidney disease, appears stable. 7. Chronic obstructive pulmonary disease. Nebulizers p.r.n.  8. Gastroesophageal reflux disease.   CODE STATUS: FULL.     TIME SPENT ON ADMISSION: 50 minutes.   ____________________________ Gladstone Lighter, MD rk:vtd D: 07/31/2011 18:43:19 ET T: 08/01/2011 06:33:14 ET JOB#: [id number]  cc: Gladstone Lighter, MD, <Dictator> Lavera Guise, MD [facility]  Rawad Bochicchio MD ELECTRONICALLY SIGNED 08/17/2011 15:12

## 2014-05-13 NOTE — Op Note (Signed)
PATIENT NAME:  Kelsey Peterson, Kelsey Peterson MR#:  [id number] DATE OF BIRTH:  [birth year]  DATE OF PROCEDURE:  04/29/2011  PREOPERATIVE DIAGNOSES:  1. End-stage renal disease.  2. Painful aneurysmal right radiocephalic AV fistula.  3. Hypertension.  4. Deafness.   POSTOPERATIVE DIAGNOSES: 1. End-stage renal disease.  2. Painful aneurysmal right radiocephalic AV fistula.  3. Hypertension.  4. Deafness.   PROCEDURES: 1. Ligation resection of right radiocephalic AV fistula.  2. Creation of right brachiocephalic AV fistula.   SURGEON: Algernon Huxley, MD   ANESTHESIA: General.   ESTIMATED BLOOD LOSS: Approximately 200 mL.   INDICATION FOR PROCEDURE: This is a 59 year old African American female with end-stage renal disease. She has a right radiocephalic AV fistula which has worked fine for dialysis but it is significantly aneurysmal and painful to her. We have discussed with her options and recommend she continue using this for some time but she has come to the point where she no longer wants to have this fistula due to the ongoing pain and swelling and is adamant that we resect this fistula and gave her a new access. We discussed options and thought that was an option and offered it to her and informed consent was obtained.   DESCRIPTION OF PROCEDURE: The patient was brought to the operative suite and after an adequate level of general endotracheal anesthesia was obtained, the right upper extremity was sterilely prepped and draped and a sterile surgical field was created. An incision was created overlying the radiocephalic AV fistula not far from its anastomosis in the aneurysmal portion. This was dissected out with tortuosity. There was a stump of normal sized cephalic vein just beyond the anastomosis before the aneurysm began. We gained control in this area and doubly ligated it with 2-0 silk ties. I resected out several centimeters of the aneurysmal fistula and oversewed the distal portion of the aneurysm  with 5-0 Prolene suture. I then closed this wound with a running 3-0 Vicryl and a running 4-0 Monocryl.  I then turned my attention to the fistula creation. A curvilinear incision was created at the antecubital fossa. The cephalic vein was decent size although not particularly enlarged. The basilic vein was quite large. The cephalic vein was marked for orientation after being dissected out and several small branches ligated and divided between silk ties. The brachial vein and brachial artery were densely adherent and honestly appeared to be connected as both were pulsatile and large. This may be some aneurysmal degeneration within the brachial artery from long-term fistula downstream. The vessel was dissected out. We flashed it and showed that there was brisk pulsatile inflow and then used a vessel loop distally and a vascular clamp proximally to control the artery which was significantly thin-walled due to the aneurysmal degeneration. The cephalic vein was then cut and beveled to an appropriate length to match the arteriotomy. The patient was given 3000 units of intravenous heparin for systemic anticoagulation. An anastomosis was created with a running 6-0 Prolene suture in the usual fashion. Two 6-0 Prolene patch sutures were used for hemostasis. The vessel was flushed and de-aired prior to release of control. There was some needle hole bleeding and local bleeding was controlled with several minutes of pressure and the wound was irrigated. Surgicel and Evicyl topical hemostatic agents were placed and hemostasis was complete. I closed the wound with a running 3-0 Vicryl and a 4-0 Monocryl.   The patient was awakened from anesthesia and taken to the recovery room with excellent thrill  within her AV fistula having tolerated the procedure well.   ____________________________ Algernon Huxley, MD jsd:drc D: 04/29/2011 15:25:06 ET T: 04/29/2011 17:48:10 ET JOB#: [id number]  cc: Algernon Huxley, MD, <Dictator> Algernon Huxley MD ELECTRONICALLY SIGNED 04/30/2011 12:01

## 2014-05-16 NOTE — Consult Note (Signed)
PATIENT NAME:  Kelsey Peterson, Kelsey Peterson MR#:  [id number] DATE OF BIRTH:  Jun 27, 1955  DATE OF CONSULTATION:  01/17/2014  REFERRING PHYSICIAN:   Ether Griffins CONSULTING PHYSICIAN:  Cheral Marker. Ola Spurr, MD   REASON FOR CONSULTATION: MSSA bacteremia and leukemoid reaction.   HISTORY OF PRESENT ILLNESS: This is a very pleasant 59 year old female on dialysis, who was admitted December 24 with uremia and volume overload. She had missed 2 days of dialysis. She also had been having some fever and malaise. She then developed diarrhea and abdominal pain. She also reports she has been having difficulty walking and leg weakness. The patient was admitted when her family did not hear from her, and they found her with stool incontinence and too weak to stand on her own. In the Emergency Room, she was felt to be dehydrated and uremic with a creatinine of 16. The patient was admitted and has been seen by nephrology. She has been on dialysis and has clinically improved in terms of her mentation. However, she has had bacteremia with multiple blood cultures growing MSSA. She was initially on vancomycin but is now on cefazolin. She, however, has an increase in white count up to 40,000. She complains today of abdominal pain. Her diarrhea has stopped.  No Clostridium difficile was checked since she did not have any recurrent diarrhea. She has some mild nausea. She also reports some back pain. She says she has great difficulty moving due to weakness in her legs as well as the abdominal pain. Of note, she had undergone surgery in August with Dr. Delana Meyer for complication of her fistula in the right upper extremity. 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 She does also have a right neck hemodialysis catheter in place.   PAST MEDICAL HISTORY: 1. End-stage renal disease.  2. 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  3. Hypertension.   PAST  SURGICAL HISTORY:  1.  Right upper extremity fistula placement and revision.  2.  Right internal jugular vascular catheter placement.   SOCIAL HISTORY: The patient lives alone, does not smoke, drink, or do drugs. Family is involved in her care.   FAMILY HISTORY: Positive for chronic kidney disease.   ALLERGIES: SOUNDS LIKE ACE INHIBITOR AS WELL AS IV DYE AND MORPHINE.   REVIEW OF SYSTEMS:  Difficult to review with her deafness.   PHYSICAL EXAMINATION:   VITAL SIGNS: Temperature 98.8, pulse 86, blood pressure 153/90, respirations 19, saturation 94% on 2 liters.  GENERAL: She is obese, lying in bed. She is deaf, in no acute distress.  HEENT: Pupils are reactive. Extraocular movements are intact. Sclerae are anicteric.  Oropharynx is clear.  NECK: Supple.  HEART: Regular with a 2/6 systolic murmur.  LUNGS: Clear.  ABDOMEN: Mildly distended, soft, but tender to palpation diffusely.  EXTREMITIES: 1+ edema, bilateral lower extremities. She has a catheter in her right neck which has no tenderness or redness on it. She has an AV graft in her right upper extremity but I do not appreciate much of a thrill or bruit.  NEUROLOGIC: She is alert and oriented x3. She does have lower extremity weakness, although some of this was due to pain in her back and her abdomen.   LABORATORY DATA: Blood cultures done December 27, 28, and 29 are all growing MSSA. White blood count initially on December 24 was 13.9, currently it is 47.4, up from 32.1 on 12/28.  Hemoglobin 7.4, platelets 220,000, differential shows 83% neutrophils. Renal function is consistent with end-stage renal disease. LFTs done December 28 showed protein 6.2, albumin 1.8, total bilirubin 1.3, alkaline phosphatase 127, AST 33, ALT 21. TSH was normal.   IMAGING: CT of the head was negative. No other imaging is pending.   IMPRESSION: A 59 year old female on dialysis through a right dialysis neck catheter. She missed dialysis for a few days and came  in uremic. She has also had fevers and increasing white count. She has methicillin-sensitive Staphylococcus aureus bacteremia. She also is having abdominal pain and initially had some diarrhea and vomiting but this has resolved. She is also complaining of back pain.   RECOMMENDATIONS: 1. MSSA bacteremia. This is likely a line source. She will have to have the line removed, and I will with Dr. Holley Raring regarding this. We will continue cefazolin for now as it was methicillin sensitive. We will continue to check blood cultures until cleared. I have ordered an echocardiogram to evaluate for possible endocarditis.  2. Abdominal pain and marked leukemoid reaction with white count of 45,000. We will check a CT of her abdomen and pelvis today. If she has, diarrhea we will check Clostridium difficile again as that can cause a very high white count.  3. Back pain. This may be attributed to diskitis or osteomyelitis from the Staphylococcus aureus bacteremia. If her weakness does not improve as her condition improves, I would suggest MRI of her spine to evaluate for infection.   Thank you for the consult. I will be glad to follow with you    ____________________________ Cheral Marker. Ola Spurr, MD dpf:jp D: 01/17/2014 15:43:00 ET T: 01/17/2014 16:51:05 ET JOB#: [id number]  cc: Cheral Marker. Ola Spurr, MD, <Dictator> Kelsey Peterson Ola Spurr MD ELECTRONICALLY SIGNED 01/21/2014 21:28

## 2014-05-16 NOTE — Op Note (Signed)
PATIENT NAME:  Kelsey Peterson, Kelsey Peterson MR#:  [id number] DATE OF BIRTH:  18-Apr-1955  DATE OF PROCEDURE:  01/18/2014  PREOPERATIVE DIAGNOSES:   1. End-stage renal disease.  2. Bacteremia.  3. PermCath infection.   POSTOPERATIVE DIAGNOSES:  1. End-stage renal disease.  2. Bacteremia.  3. PermCath infection  PROCEDURE: Removal of right jugular PermCath.  SURGEON:  Algernon Huxley, MD  ANESTHESIA:  Local.  ESTIMATED BLOOD LOSS:  Minimal.  INDICATION FOR PROCEDURE:  A 59 year old female admitted with sepsis, markedly elevated white blood cell count, and found to be bacteremic. Her most likely source is her PermCath that she would not allow to be removed as it should have been several months ago. She has another accessed that was usable. This now clearly needs to be removed, as she has life-threatening infection, and she is agreeing to proceed.   Risks and benefits were discussed and informed consent was obtained.   DESCRIPTION OF PROCEDURE:  The patient's right neck, chest, and existing catheter were sterilely prepped and draped.  The area around the catheter was anesthetized copiously with 1% Lidocaine. The catheter was dissected out with curved hemostats until the cuff was freed from the surrounding fibrous sheath.  The fibrous sheath was transected and the catheter was then removed in its entirety using gentle traction.  Pressure was held and sterile dressings placed.    The patient tolerated the procedure well and was taken to the recovery room in stable condition.   ____________________________ Algernon Huxley, MD jsd:mw D: 01/18/2014 13:50:00 ET T: 01/18/2014 17:07:22 ET JOB#: [id number]  cc: Algernon Huxley, MD, <Dictator> Algernon Huxley MD ELECTRONICALLY SIGNED 01/22/2014 14:14

## 2014-05-20 NOTE — Discharge Summary (Signed)
PATIENT NAME:  Kelsey Peterson, Kelsey Peterson MR#:  [id number] DATE OF BIRTH:  09-07-1955  DATE OF ADMISSION:  02/23/2014 DATE OF DISCHARGE:  02/27/2014  PRESENTING COMPLAINT: Increasing shortness of breath and missed hemodialysis.   DISCHARGE DIAGNOSES:  1.  Increased shortness of breath and cough secondary to missing hemodialysis, consistent with pulmonary edema volume overload, resolved after the patient was resumed back on hemodialysis.  2.  Right upper extremity hematoma at arteriovenous fistula site. Workup as an outpatient by Dr. Delana Meyer.  3.  Hyperkalemia, improved after hemodialysis.  4.  End-stage renal disease, on hemodialysis, status post right chest PermCath placement by Dr. Lucky Cowboy.  5.  Possible pneumonia.  6.  Hypertension.   CONSULTATIONS:   1.  Vascular consultation, Algernon Huxley, MD. 2.  Nephrology consultation, Dr. Candiss Norse and Mamie Levers, MD.  PROCEDURE: February 8, the patient had right jugular PermCath tunneled dialysis catheter placed by Dr. Lucky Cowboy.   BRIEF SUMMARY AND HOSPITAL COURSE: 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  Increasing shortness of breath and cough with missing hemodialysis, consistent with pulmonary edema volume overload. The patient had dialysis with ultrafiltration. Her saturations were back to normal on room air. No fever. Blood cultures were negative. 2.  Hyperkalemia, improved after hemodialysis. No acute EKG changes  3.  End-stage renal disease, on hemodialysis. The patient underwent a right PermCath placement by Dr. Lucky Cowboy. Her AV fistula has large hematoma, which workup will be done as an outpatient by Dr. Delana Meyer.  4.  Possible pneumonia. Levaquin prescribed.  5.  Hypertension. Home medications  received.  Hospital stay otherwise remained stable.   CODE STATUS: The patient remained a full code.   HOME MEDICATIONS:  1.  Dialyvite 1 tablet daily.  2.  Amlodipine 5 mg daily.  3.  Nexium 40 mg daily.  4.  ProAir HFA 2 puffs every 4 hours as needed.  5.  Fexofenadine 30 mg b.i.d.  6.  Imdur 30 mg extended release p.o. daily.  7.  Renvela 800 mg 2 tablets 3 times a day.  8.  Renvela 800 mg 1 tablet b.i.d. with snacks.  9.  Endocet 5/325 one to two every 6 hours as needed.  10.  Promethazine 25 mg 1 tablet every 6-8 hourly as needed.  11.  Sensipar 60 mg p.o. daily.  12.  Levaquin 250 every other day.   FOLLOWUP: With Dr. Lucky Cowboy, Dr. Delana Meyer for her AV fistula workup as outpatient.   TIME SPENT: Forty minutes.    ____________________________ Hart Rochester Posey Pronto, MD sap:TM D: 03/01/2014 12:59:21 ET T: 03/01/2014 17:01:27 ET JOB#: [id number]  cc: Christpoher Sievers A. Posey Pronto, MD, <Dictator> Ilda Basset MD ELECTRONICALLY SIGNED 03/20/2014 17:27

## 2014-05-20 NOTE — H&P (Signed)
PATIENT NAME:  Kelsey Peterson, Kelsey Peterson MR#:  [id number] DATE OF BIRTH:  30-Jun-1955  DATE OF ADMISSION:  02/23/2014  PRIMARY CARE PHYSICIAN: Lavera Guise, MD   CHIEF COMPLAINT: Shortness of breath, cough, and sputum since last night.   HISTORY OF PRESENT ILLNESS: A 59 year old [place] American female with a history of ESRD, hypertension, presented to the ED with the above chief complaint. The patient is alert, awake, but has mild confusion with hard of hearing. The patient said she started to have shortness of breath and cough since last night. The patient missed dialysis twice this week, so she came to the ED for further evaluation. The patient denies any fever or chills. No orthopnea, nocturnal dyspnea, but complains of some leg edema. The patient's oxygen saturation was 90% on room air. The patient's chest x-ray showed fluid overload and possible pneumonia. The patient was treated with Rocephin in the ED. In addition, the patient's potassium is high at 6.8.   PAST MEDICAL HISTORY: ESRD on hemodialysis, hypertension, adult onset deafness, PermCath infection last month, anemia.    PAST SURGICAL HISTORY: AV fistula on the right arm, tubal ligation, dialysis catheter.   ALLERGIES:  1.  TO SOME BLOOD PRESSURE MEDICATION, THE PATIENT DOES NOT KNOW.  2.  IVP DYE.  3.  MORPHINE.   HOME MEDICATIONS: Sensipar 60 mg p.o. daily, Renvela 800 mg p.o. t.i.d. with meals and twice a day with a snack, promethazine 25 mg every 6 to 8 hours p.r.n., ProAir HFA CFC 2 puffs every 4 hours p.r.n., Nexium 40 mg p.o. daily, Imdur 30 mg p.o. daily, fexofenadine 30 mg p.o. b.i.d., Endocet 5/325 mg 1 to 2 tablets every 6 hours p.r.n., dialyvite 1 tablet once a day, Norvasc 5 mg p.o. daily.   REVIEW OF SYSTEMS: CONSTITUTIONAL: The patient denies any fever or chills. No headache or dizziness but has weakness.  EYES: No double vision or blurred vision.  ENT: No postnasal drip, slurred speech or dysphagia. Impaired hearing.   CARDIOVASCULAR: No chest pain, palpitation, orthopnea, or nocturnal dyspnea, but has leg edema.  PULMONARY: Positive for cough, sputum, shortness of breath. No hemoptysis.  GASTROINTESTINAL: No nausea, vomiting, diarrhea. No melena or bloody stool.  GENITOURINARY: No dysuria, hematuria, or incontinence.  SKIN: No rash or jaundice.  NEUROLOGIC: No syncope, loss of consciousness, or seizure.  ENDOCRINOLOGY: No polyuria or polydipsia, heat or cold intolerance.  HEMATOLOGIC: No easy bruising or bleeding.   PHYSICAL EXAMINATION: VITAL SIGNS: Temperature 98.1, blood pressure 200/98, pulse 77, oxygen saturation 98% on oxygen.  GENERAL: The patient is alert, awake, oriented but has mild confusion and hard of hearing.  HEENT: Pupils round, equal and reactive to light and accommodation. Moist oral mucosa. Clear oropharynx.  NECK: Supple. Positive for JVD. No thyromegaly. No lymphadenopathy.  CARDIOVASCULAR: S1, S2, regular rate and rhythm. No murmurs, gallop.  PULMONARY: Bilateral air entry. Bilateral crackles. No wheezing. Mild use of accessory muscles to breathe.  ABDOMEN: Soft, obese. No distention or tenderness. No organomegaly. Bowel sounds present.   EXTREMITIES: No obvious edema, clubbing or cyanosis. No calf tenderness. Bilateral pedal pulses present. Right AV fistula bruit present.  NEUROLOGY: A and O x 3. Mildly confused. but follow commands and no obvious focal deficit. Power 3 to 4/5. Sensation intact.   LABORATORY DATA: Chest x-ray showed persistent volume overload, focal airspace consolidation on right base, most likely focal pneumonia.   WBC 10.8, hemoglobin 7.7, platelets 366,000. Troponin 0.05. Glucose 75, BUN 54, creatinine 14.59, sodium 140, potassium 6.8,  chloride 106, bicarbonate 23.   IMPRESSIONS: 1.  Fluid overload with missed dialysis.  2.  Hyperkalemia.  3.  Possible pneumonia on the right side.  4.  Anemia.  5.  Hypertension malignancy.  6.  End-stage renal disease.     PLAN OF TREATMENT: 1.  The patient will be admitted to the medical telemetry floor with telemonitor. The patient needs emergent dialysis. Dr. Reita Cliche discussed with on-call nephrology. The patient will get emergent dialysis. We will monitor potassium.  2.  For possible pneumonia, the patient was treated with Rocephin and Zithromax in ED. I will start Levaquin dose per pharmacy and follow up sputum culture.  3.  For hypertension malignancy, we will continue the patient's home medications of Imdur and Norvasc and give hydralazine IV p.r.n.  4.  Continue patient's other home medications.  5.  I discussed the patient's condition and plan of treatment with the patient.  6.  The patient is a full code.   TIME SPENT: About 58 minutes.    ____________________________ Demetrios Loll, MD qc:at D: 02/23/2014 12:26:10 ET T: 02/23/2014 12:55:25 ET JOB#: [id number]  cc: Demetrios Loll, MD, <Dictator> Demetrios Loll MD ELECTRONICALLY SIGNED 02/26/2014 12:03

## 2014-05-20 NOTE — Consult Note (Signed)
Brief Consult Note: Diagnosis: hematoma of right arm dialysis graft.   Recommend to proceed with surgery or procedure.   Comments: given the massive size of the hematoma I do not believe tha tshe will be reliably cannulated as an out patient.  I recommend that the right arm access be excised and a catheter placed.  She will need to come to grips with a laeft arm access.  Electronic Signatures: Hortencia Pilar (MD)  (Signed 05-Feb-16 17:45)  Authored: Brief Consult Note   Last Updated: 05-Feb-16 17:45 by Hortencia Pilar (MD)

## 2014-05-20 NOTE — Op Note (Signed)
PATIENT NAME:  Kelsey Peterson, Kelsey Peterson MR#:  [id number] DATE OF BIRTH:  May 04, 1955  DATE OF PROCEDURE:  02/26/2014  PREOPERATIVE DIAGNOSES:  1.  End-stage renal disease with need for tunneled dialysis catheter due to complications of right arm arteriovenous graft.  2.  Previous PermCath placements.  3.  Hypertension.   POSTOPERATIVE DIAGNOSES:  1.  End-stage renal disease with need for tunneled dialysis catheter due to complications of right arm arteriovenous graft.  2.  Previous PermCath placements.  3.  Hypertension.  4.  Right jugular and innominate vein occlusions.   PROCEDURES:  1.  Ultrasound guidance for vascular access to right jugular vein.  2.  Right jugular venogram and superior venacavogram.  3.  Percutaneous transluminal angioplasty of right jugular and innominate veins with 8 mm diameter angioplasty balloon.  4.  Fluoroscopic guidance for placement of catheter.  5.  Placement of a 19 cm tip to cuff tunneled hemodialysis catheter.   SURGEON: Algernon Huxley, MD.   ANESTHESIA: Local with moderate conscious sedation.   ESTIMATED BLOOD LOSS: 25 mL.   CONTRAST USED: 15 mL Visipaque.   INDICATION FOR PROCEDURE:  A 59 year old African-American female with end-stage renal disease. She is brought down for a PermCath. She has complication of her right arm AV graft, will no longer be usable. Risks and benefits were discussed. Informed consent was obtained.   DESCRIPTION OF PROCEDURE: The patient was brought to the vascular suite. The right neck and chest were sterilely prepped and draped and a sterile surgical field was created. The right jugular vein was visualized in the neck and appeared to be patent in the neck, although it could be not followed across the clavicle on ultrasound. It was accessed under direct ultrasound guidance with a micropuncture needle and a permanent image was recorded. A micropuncture wire and sheath were placed and the wire would not track easily. Imaging through  this showed occlusion of the jugular vein at the clavicle and occlusion in the innominate vein with reconstitution in the superior vena cava. I then used a stiff angled Glidewire and easily crossed this occlusion without difficulty and advanced a wire into the inferior vena cava. A 6 French sheath was then placed and imaging was shown that we were now across the occlusion. I exchanged for a Magic torque wire and an 8 mm diameter x 8 cm length angioplasty balloon was inflated in the right innominate and jugular veins with a tight narrowing that was seen, that resolved at about 8 atmospheres. The balloon was deflated and completion angiogram showed markedly improved flow with restored patency of the jugular and innominate vein. I then sequentially dilated and placed a peel-away sheath over the wire into the superior vena cava. I made a counter incision 1 fingerbreadth below the right clavicle and tunneled from the subclavicular incision to the access site. Using fluoroscopic guidance a 19 cm tip to cuff tunneled hemodialysis catheter was placed through the tunnel, then placed through the peel-away sheath with the peel-away sheath removed. The catheter tips were parked in the atrium in good location, It withdrew blood well and flushed easily with heparinized saline and a concentrated heparin solution was placed.  It secured to the chest wall with 2 Prolene sutures, 4-0 Monocryl was used to close the access site, and a 4-0 Monocryl was placed around the exit site. Sterile dressings were placed. The patient tolerated the procedure well.    ____________________________ Algernon Huxley, MD jsd:bu D: 02/26/2014 14:32:32 ET T: 02/26/2014 17:14:56  ET JOB#: [id number]  cc: Algernon Huxley, MD, <Dictator> Algernon Huxley MD ELECTRONICALLY SIGNED 02/28/2014 14:07

## 2014-05-20 NOTE — Discharge Summary (Signed)
PATIENT NAME:  Kelsey Peterson, Kelsey Peterson MR#:  [id number] DATE OF BIRTH:  1955/03/26  DATE OF ADMISSION:  01/11/2014 DATE OF DISCHARGE:  01/22/2014   ADDENDUM FOR DETAILED DISCHARGE SUMMARY  For detailed discharge summary, please refer to the interim discharge summary dictated by  Dr. Vaughan Basta.    DISCHARGE DIAGNOSES:  1.  Methicillin sensitive Staph aureus bacteremia, possibly due to PermCath.  2.  End-stage renal disease.  3.  Dehydration.  4.  Hypertension.  5.  Anemia.   CONDITION: Stable.   CODE STATUS: Full Code.   HOME MEDICATIONS: Please refer to the medication reconciliation list.   DIET: Low-sodium, low-fat, low-cholesterol, renal diet.   ACTIVITY: As tolerated.   FOLLOWUP CARE: Follow with PCP, Dr. Ola Spurr or Dr. Juleen China within 1-2 weeks.   HOSPITAL COURSE:  1.  MSSA bacteremia. The patient had Staph aureus in all 3 blood cultures. Bacteria is possibly due to PermCath, which was removed. The patient has been treated with cephazolin during hemodialysis. According to Dr. Ola Spurr, the patient needs Ancef during hemodialysis for 2 weeks. The patient's echocardiogram did not show any vegetations. CAT scan of the abdomen did not show any acute abnormality. The patient's white count decreased from 47 to 18.  2.  End-stage renal disease. As mentioned above, the patient's PermCath was removed and the patient was started with AV fistula for hemodialysis.  3.  Dehydration, improved with oral fluid intake.  4.  Hypertension, controlled with Norvasc and Imdur.   5.  Anemia. The patient's hemoglobin above 7.5; however, the patient's hemoglobin decreased  to 6.4. Dr. Juleen China suggested to give 1 unit of PRBC transfusion during hemodialysis today.   The patient has no complaints. Her vital signs are stable. She is clinically stable and will be discharged to a nursing home after hemodialysis and blood transfusion today.   I discussed the patient's discharge plan with the  patient, nurse, case manager and social worker, and also discussed with Dr. Juleen China.   TIME SPENT: About 42 minutes.    ____________________________ Demetrios Loll, MD qc:MT D: 01/22/2014 14:51:03 ET T: 01/22/2014 14:59:49 ET JOB#: [id number]  cc: Demetrios Loll, MD, <Dictator> Demetrios Loll MD ELECTRONICALLY SIGNED 01/22/2014 17:10

## 2014-05-20 NOTE — Consult Note (Signed)
General Aspect complication of dialysis access   Present Illness The patient is a  59 year old female with a history of ESRD, hypertension, presented to the ED with the above chief complaint. The patient is alert, awake, but has mild confusion is deaf. The patient states she started to have shortness of breath and a cough since last night. The patient missed dialysis twice this week, so she came to the ED for further evaluation. The patient denies any fever or chills. No orthopnea, nocturnal dyspnea, but complains of some leg edema. The patient's oxygen saturation was 90% on room air. The patient's chest x-ray showed fluid overload and possible pneumonia. The patient was treated with Rocephin in the ED. In addition, the patient???s potassium is high at 6.8. She is also complaining of pain and swelling of the right arm access site.  PAST MEDICAL HISTORY: ESRD on hemodialysis, hypertension, adult onset deafness, PermCath infection last month, anemia.    PAST SURGICAL HISTORY: AV fistula on the right arm, tubal ligation   Home Medications: Medication Instructions Status  Levaquin 250 mg oral tablet 1 tab(s) orally every other day Active  fexofenadine 30 mg oral tablet, disintegrating 1  orally 2 times a day Active  Dialyvite Supreme D oral tablet 1 tab(s) orally once a day Active  amLODIPine 5 mg oral tablet 1 tab(s) orally once a day Active  Imdur 30 mg oral tablet, extended release 1 tab(s) orally once a day Active  Renvela 800 mg oral tablet 2 tab(s) orally 3 times a day (with meals) Active  Renvela carbonate 800 mg oral tablet 1 tab(s) orally 2 times a day with snacks Active  Endocet 5/325 325 mg-5 mg oral tablet 1-2 tab(s) orally every 6 hours, As Needed Active  promethazine 25 mg oral tablet 1 tab(s) orally every 6 to 8 hours, As Needed - for Nausea, Vomiting Active  Sensipar 60 mg oral tablet 1 tab(s) orally once a day Active  Nexium 40 mg oral delayed release capsule 1 cap(s) orally once a  day Active  ProAir HFA CFC free 90 mcg/inh inhalation aerosol 2 puff(s) inhaled every 4 hours, As Needed - for Shortness of Breath Active    Morphine Sulfate: Rash, Hives  IVP Dye: Itching  "some BP med"  pt doesn't know which one. Caused swelling of the face: Unknown  Case History:  Family History Non-Contributory   Social History negative tobacco, negative ETOH, negative Illicit drugs   Review of Systems:  ROS No TIA/stroke/seizure No heat or cold intolerance No dysuria/hematuria No blurry or double vision No tinnitus or ear pain No rashes or ulcer No suicidal ideation or psychosis No signs of bleeding or easy bruising No SOB/DOE, orthopnea, or sputum No palpitations or chest pain No N/V/D or abdominal pain No joint pain or joint swelling No fever or chills No unintentional weight loss or gain   Physical Exam:  GEN well developed, well nourished   HEENT moist oral mucosa, hearing is not intact   NECK supple  trachea midline   RESP normal resp effort  no use of accessory muscles   CARD regular rate  LE edema present  positive JVD   VASCULAR ACCESS AV fistula present  Good bruit  Good thrill  massive swelling of the right arm surrounding the access, it is tender and firm.  no drainage or erythema   ABD denies tenderness  soft  obese   EXTR negative cyanosis/clubbing, positive edema   SKIN normal to palpation, No rashes, No  ulcers   NEURO follows commands, motor/sensory function intact   PSYCH alert, poor insight   Nursing/Ancillary Notes: **Vital Signs.:   05-Feb-16 17:11  Vital Signs Type Admission  Temperature Temperature (F) 97.4  Celsius 36.3  Temperature Source oral  Respirations Respirations 20  Systolic BP Systolic BP 979  Diastolic BP (mmHg) Diastolic BP (mmHg) 94  Mean BP 120  Pulse Ox % Pulse Ox % 100  Pulse Ox Activity Level  At rest  Oxygen Delivery 2L    19:40  Vital Signs Type Routine  Temperature Temperature (F) 97.6  Celsius 36.4   Temperature Source oral  Pulse Pulse 88  Respirations Respirations 18  Systolic BP Systolic BP 892  Diastolic BP (mmHg) Diastolic BP (mmHg) 86  Mean BP 108  Pulse Ox % Pulse Ox % 96  Pulse Ox Activity Level  At rest  Oxygen Delivery 2L   Routine Micro:  05-Feb-16 10:11   Micro Text Report CLOSTRIDIUM DIFFICILE   C.DIFFICILE ANTIGEN       C.DIFFICILE GDH ANTIGEN : NEGATIVE   C.DIFFICILE TOXIN A/B     C.DIFFICILE TOXINS A AND B : NEGATIVE   INTERPRETATION            Negative for C. difficile.    ANTIBIOTIC                          12:21   Micro Text Report    ANTIBIOTIC                         13:36   Micro Text Report BLOOD CULTURE   COMMENT                   NO GROWTH IN 18-24 HOURS   ANTIBIOTIC                       Culture Comment NO GROWTH IN 18-24 HOURS  Result(s) reported on 24 Feb 2014 at 02:00PM.  General Ref:  05-Feb-16 09:41   HBsAg ========== TEST NAME ==========  ========= RESULTS =========  = REFERENCE RANGE =  HEPATITIS B SURFACE AG  HBsAg Screen HBsAg Screen                    [   Negative             ]          Negative               [facility]            No: [id number]           [address], [place], [place] [address]           Lindon Romp, MD         [phone]   Result(s) reported on 24 Feb 2014 at 08:20AM.  Routine Chem:  05-Feb-16 09:41   Glucose, Serum 75  BUN  54  Creatinine (comp)  14.59  Sodium, Serum 140  Potassium, Serum  6.8  Chloride, Serum 106  CO2, Serum 23  Calcium (Total), Serum 8.7  Anion Gap 11  Osmolality (calc) 293  eGFR (African American)  3  eGFR (Non-African American)  3 (eGFR values <17m/min/1.73 m2 may be an indication of chronic kidney disease (CKD). Calculated eGFR, using the MRDR Study equation, is useful in  patients with stable renal function.  The eGFR calculation will not be reliable in acutely ill patients when serum creatinine is changing rapidly. It is not useful in patients on  dialysis. The eGFR calculation may not be applicable to patients at the low and high extremes of body sizes, pregnant women, and vegetarians.)  Result Comment POTASSIUM - RESULTS VERIFIED BY REPEAT TESTING.  - NOTIFIED OF CRITICAL VALUE  - C/ANNA HOLT AT 1059 ON 02/23/14  - READ-BACK PROCESS PERFORMED.  Result(s) reported on 23 Feb 2014 at 10:39AM.    12:21   Result Comment SPUTUM CULTURE - SPUTUM SPECIMEN NOT ACCEPTABLE FOR TESTING.  - PLEASE RECOLLECT.  - SHARON YOW 02/24/14 @ 1313.Marland KitchenMarland KitchenMTV    13:36   Glucose, Serum  29  BUN  53  Creatinine (comp)  15.05  Sodium, Serum 141  Potassium, Serum  5.3  Chloride, Serum 105  CO2, Serum 25  Calcium (Total), Serum 9.1  Anion Gap 11  Osmolality (calc) 292  eGFR (African American)  3  eGFR (Non-African American)  3 (eGFR values <31m/min/1.73 m2 may be an indication of chronic kidney disease (CKD). Calculated eGFR, using the MRDR Study equation, is useful in  patients with stable renal function. The eGFR calculation will not be reliable in acutely ill patients when serum creatinine is changing rapidly. It is not useful in patients on dialysis. The eGFR calculation may not be applicable to patients at the low and high extremes of body sizes, pregnant women, and vegetarians.)  Result Comment GLUCOSE - RESULTS VERIFIED BY REPEAT TESTING.  - CRITICAL RESULT CALLED TO AND READ BACK  - FROM SHARON YOW/1425;02-23-2014/SFW.  Result(s) reported on 23 Feb 2014 at 02:31PM.  Magnesium, Serum  2.8 (1.8-2.4 THERAPEUTIC RANGE: 4-7 mg/dL TOXIC: > 10 mg/dL  -----------------------)  Cardiac:  05-Feb-16 09:41   Troponin I 0.05 (0.00-0.05 0.05 ng/mL or less: NEGATIVE  Repeat testing in 3-6 hrs  if clinically indicated. >0.05 ng/mL: POTENTIAL  MYOCARDIAL INJURY. Repeat  testing in 3-6 hrs if  clinically indicated. NOTE: An increase or decrease  of 30% or more on serial  testing suggests a  clinically important change)  Routine Hem:  05-Feb-16 09:41    Hemoglobin (CBC)  7.7  WBC (CBC) 10.8  RBC (CBC)  2.74  Hematocrit (CBC)  22.7  Platelet Count (CBC) 366 (Result(s) reported on 23 Feb 2014 at 10:17AM.)  MCV 83  MCH 28.2  MCHC 34.1  RDW  28.6   XRay:    05-Feb-16 10:04, Chest Portable Single View  Chest Portable Single View   REASON FOR EXAM:    shortness of breath  COMMENTS:       PROCEDURE: DXR - DXR PORTABLE CHEST SINGLE VIEW  - Feb 23 2014 10:04AM     CLINICAL DATA:  One day history of shortness of breath. No renal  failure    EXAM:  PORTABLE CHEST - 1 VIEW    COMPARISON:  October 12, 2013    FINDINGS:  There is patchy airspace consolidation in the right base. Lungs  elsewhere clear. Heart is enlarged with a degree of pulmonary venous  hypertension. No adenopathy. No bone lesions.     IMPRESSION:  Persistent volume overload. Focal airspace consolidation right base,  most likely focal pneumonia. Lungs elsewhere clear.      Electronically Signed    By: WLowella GripM.D.    On: 02/23/2014 10:18         Verified By: WLeafy Kindle WOODRUFF, M.D.,    Impression 1.  Complication  of dialysis device.  This is a very complicated situation in part because the patient has been very difficult to deal with in the past. I am amazed that we were able to access the graft and over all as an out patient I do not think this is a viable access.  I would plan to place a catheter once her K+ and volume status has been restored and then I would evaluate her left arm for a new access.  I also think she will need the right graft to be excised. 2.  Pneumonia, the patient was treated with Rocephin, Zithromax in ED. I will start Levaquin dose per pharmacy and follow up sputum culture.  3.  Hypertension malignancy, we will continue the patient's home medications of Imdur and Norvasc and give hydralazine IV p.r.n.  4.  Hyperkalemia she will require dialysis 5.  End stage renal disease nephrology on consult will continue HD   Plan level  4 consult   Electronic Signatures: Hortencia Pilar (MD)  (Signed 07-Feb-16 12:05)  Authored: General Aspect/Present Illness, Home Medications, Allergies, History and Physical Exam, Vital Signs, Labs, Radiology, Impression/Plan   Last Updated: 07-Feb-16 12:05 by Hortencia Pilar (MD)

## 2014-06-21 ENCOUNTER — Inpatient Hospital Stay
Admission: EM | Admit: 2014-06-21 | Discharge: 2014-06-22 | DRG: 391 | Disposition: A | Discharge status: Home / Self Care | Payer: Medicare Other | Attending: Internal Medicine | Admitting: Internal Medicine

## 2014-06-21 ENCOUNTER — Emergency Department: Payer: Medicare Other

## 2014-06-21 ENCOUNTER — Encounter: Payer: Self-pay | Admitting: Emergency Medicine

## 2014-06-21 ENCOUNTER — Inpatient Hospital Stay
Admit: 2014-06-21 | Discharge: 2014-06-21 | Disposition: A | Discharge status: Home / Self Care | Payer: Medicare Other | Attending: Internal Medicine | Admitting: Internal Medicine

## 2014-06-21 DIAGNOSIS — R079 Chest pain, unspecified: Secondary | ICD-10-CM

## 2014-06-21 DIAGNOSIS — E1122 Type 2 diabetes mellitus with diabetic chronic kidney disease: Secondary | ICD-10-CM | POA: Diagnosis not present

## 2014-06-21 DIAGNOSIS — I120 Hypertensive chronic kidney disease with stage 5 chronic kidney disease or end stage renal disease: Secondary | ICD-10-CM | POA: Diagnosis present

## 2014-06-21 DIAGNOSIS — I214 Non-ST elevation (NSTEMI) myocardial infarction: Secondary | ICD-10-CM | POA: Diagnosis present

## 2014-06-21 DIAGNOSIS — R748 Abnormal levels of other serum enzymes: Secondary | ICD-10-CM | POA: Diagnosis not present

## 2014-06-21 DIAGNOSIS — R197 Diarrhea, unspecified: Secondary | ICD-10-CM

## 2014-06-21 DIAGNOSIS — B1920 Unspecified viral hepatitis C without hepatic coma: Secondary | ICD-10-CM | POA: Diagnosis present

## 2014-06-21 DIAGNOSIS — R531 Weakness: Secondary | ICD-10-CM | POA: Diagnosis not present

## 2014-06-21 DIAGNOSIS — Z8249 Family history of ischemic heart disease and other diseases of the circulatory system: Secondary | ICD-10-CM

## 2014-06-21 DIAGNOSIS — Z992 Dependence on renal dialysis: Secondary | ICD-10-CM

## 2014-06-21 DIAGNOSIS — Z7951 Long term (current) use of inhaled steroids: Secondary | ICD-10-CM | POA: Diagnosis not present

## 2014-06-21 DIAGNOSIS — I2510 Atherosclerotic heart disease of native coronary artery without angina pectoris: Secondary | ICD-10-CM | POA: Diagnosis present

## 2014-06-21 DIAGNOSIS — K529 Noninfective gastroenteritis and colitis, unspecified: Secondary | ICD-10-CM | POA: Diagnosis present

## 2014-06-21 DIAGNOSIS — D638 Anemia in other chronic diseases classified elsewhere: Secondary | ICD-10-CM | POA: Diagnosis present

## 2014-06-21 DIAGNOSIS — N186 End stage renal disease: Secondary | ICD-10-CM | POA: Diagnosis not present

## 2014-06-21 DIAGNOSIS — Z79899 Other long term (current) drug therapy: Secondary | ICD-10-CM | POA: Diagnosis not present

## 2014-06-21 DIAGNOSIS — H9190 Unspecified hearing loss, unspecified ear: Secondary | ICD-10-CM | POA: Diagnosis present

## 2014-06-21 DIAGNOSIS — R112 Nausea with vomiting, unspecified: Secondary | ICD-10-CM

## 2014-06-21 DIAGNOSIS — R0789 Other chest pain: Secondary | ICD-10-CM

## 2014-06-21 DIAGNOSIS — J449 Chronic obstructive pulmonary disease, unspecified: Secondary | ICD-10-CM | POA: Diagnosis not present

## 2014-06-21 DIAGNOSIS — E876 Hypokalemia: Secondary | ICD-10-CM | POA: Diagnosis not present

## 2014-06-21 DIAGNOSIS — R110 Nausea: Secondary | ICD-10-CM

## 2014-06-21 DIAGNOSIS — E119 Type 2 diabetes mellitus without complications: Secondary | ICD-10-CM | POA: Diagnosis present

## 2014-06-21 HISTORY — DX: Chronic obstructive pulmonary disease, unspecified: J44.9

## 2014-06-21 HISTORY — DX: Unspecified viral hepatitis C without hepatic coma: B19.20

## 2014-06-21 HISTORY — DX: End stage renal disease: N18.6

## 2014-06-21 HISTORY — DX: End stage renal disease: Z99.2

## 2014-06-21 HISTORY — DX: Unspecified hearing loss, unspecified ear: H91.90

## 2014-06-21 HISTORY — DX: Migraine with aura, not intractable, without status migrainosus: G43.109

## 2014-06-21 LAB — COMPREHENSIVE METABOLIC PANEL
ALK PHOS: 101 U/L (ref 38–126)
ALT: 28 U/L (ref 14–54)
AST: 37 U/L (ref 15–41)
Albumin: 3 g/dL — ABNORMAL LOW (ref 3.5–5.0)
Anion gap: 13 (ref 5–15)
BILIRUBIN TOTAL: 1.5 mg/dL — AB (ref 0.3–1.2)
BUN: 21 mg/dL — ABNORMAL HIGH (ref 6–20)
CHLORIDE: 96 mmol/L — AB (ref 101–111)
CO2: 29 mmol/L (ref 22–32)
CREATININE: 4.73 mg/dL — AB (ref 0.44–1.00)
Calcium: 7.8 mg/dL — ABNORMAL LOW (ref 8.9–10.3)
GFR calc Af Amer: 11 mL/min — ABNORMAL LOW (ref >60)
GFR, EST NON AFRICAN AMERICAN: 9 mL/min — AB (ref >60)
GLUCOSE: 87 mg/dL (ref 65–99)
POTASSIUM: 3.1 mmol/L — AB (ref 3.5–5.1)
Sodium: 138 mmol/L (ref 135–145)
TOTAL PROTEIN: 6.9 g/dL (ref 6.5–8.1)

## 2014-06-21 LAB — CBC WITH DIFFERENTIAL/PLATELET
Basophils Absolute: 0.1 10*3/uL (ref 0–0.1)
Basophils Relative: 1 %
Eosinophils Absolute: 0.1 10*3/uL (ref 0–0.7)
Eosinophils Relative: 1 %
HCT: 23.7 % — ABNORMAL LOW (ref 35.0–47.0)
HEMOGLOBIN: 7.5 g/dL — AB (ref 12.0–16.0)
LYMPHS PCT: 12 %
Lymphs Abs: 1.8 10*3/uL (ref 1.0–3.6)
MCH: 24.6 pg — ABNORMAL LOW (ref 26.0–34.0)
MCHC: 31.5 g/dL — ABNORMAL LOW (ref 32.0–36.0)
MCV: 78.2 fL — ABNORMAL LOW (ref 80.0–100.0)
Monocytes Absolute: 1 10*3/uL — ABNORMAL HIGH (ref 0.2–0.9)
Monocytes Relative: 7 %
Neutro Abs: 11.9 10*3/uL — ABNORMAL HIGH (ref 1.4–6.5)
Neutrophils Relative %: 79 %
Platelets: 324 10*3/uL (ref 150–440)
RBC: 3.03 MIL/uL — ABNORMAL LOW (ref 3.80–5.20)
RDW: 20.5 % — ABNORMAL HIGH (ref 11.5–14.5)
WBC: 14.9 10*3/uL — AB (ref 3.6–11.0)

## 2014-06-21 LAB — TSH: TSH: 10.667 u[IU]/mL — ABNORMAL HIGH (ref 0.350–4.500)

## 2014-06-21 LAB — URINALYSIS COMPLETE WITH MICROSCOPIC (ARMC ONLY)
Bilirubin Urine: NEGATIVE
GLUCOSE, UA: NEGATIVE mg/dL
Nitrite: NEGATIVE
Specific Gravity, Urine: 1.016 (ref 1.005–1.030)
pH: 7 (ref 5.0–8.0)

## 2014-06-21 LAB — HEMOGLOBIN A1C: Hgb A1c MFr Bld: 5.8 % (ref 4.0–6.0)

## 2014-06-21 LAB — GLUCOSE, CAPILLARY: Glucose-Capillary: 132 mg/dL — ABNORMAL HIGH (ref 65–99)

## 2014-06-21 LAB — TROPONIN I
TROPONIN I: 0.28 ng/mL — AB (ref <0.031)
Troponin I: 0.28 ng/mL — ABNORMAL HIGH (ref <0.031)
Troponin I: 0.23 ng/mL — ABNORMAL HIGH (ref <0.031)

## 2014-06-21 LAB — BRAIN NATRIURETIC PEPTIDE: B Natriuretic Peptide: >4500 pg/mL — ABNORMAL HIGH (ref 0.0–100.0)

## 2014-06-21 LAB — LIPASE, BLOOD: LIPASE: 33 U/L (ref 22–51)

## 2014-06-21 MED ORDER — METRONIDAZOLE IN NACL 5-0.79 MG/ML-% IV SOLN: 500.0000 mg | Freq: Three times a day (TID) | INTRAVENOUS | Status: DC

## 2014-06-21 MED ORDER — ALBUTEROL SULFATE (2.5 MG/3ML) 0.083% IN NEBU: 2.5000 mg | INHALATION_SOLUTION | RESPIRATORY_TRACT | Status: DC | PRN

## 2014-06-21 MED ORDER — ISOSORBIDE MONONITRATE ER 30 MG PO TB24
30.0000 mg | ORAL_TABLET | Freq: Every day | ORAL | Status: DC
  Administered 2014-06-21 – 2014-06-22 (×2): 30 mg via ORAL
  Filled 2014-06-21 (×2): qty 1

## 2014-06-21 MED ORDER — BISACODYL 5 MG PO TBEC: 5.0000 mg | DELAYED_RELEASE_TABLET | Freq: Every day | ORAL | Status: DC | PRN

## 2014-06-21 MED ORDER — TRAZODONE HCL 50 MG PO TABS: 25.0000 mg | ORAL_TABLET | Freq: Every evening | ORAL | Status: DC | PRN

## 2014-06-21 MED ORDER — ENOXAPARIN SODIUM 40 MG/0.4ML ~~LOC~~ SOLN: 40.0000 mg | SUBCUTANEOUS | Status: DC

## 2014-06-21 MED ORDER — CIPROFLOXACIN HCL 500 MG PO TABS
500.0000 mg | ORAL_TABLET | Freq: Every day | ORAL | Status: DC
  Administered 2014-06-21 – 2014-06-22 (×2): 500 mg via ORAL
  Filled 2014-06-21 (×2): qty 1

## 2014-06-21 MED ORDER — FERROUS SULFATE 325 (65 FE) MG PO TABS
325.0000 mg | ORAL_TABLET | Freq: Every day | ORAL | Status: DC
  Administered 2014-06-21 – 2014-06-22 (×2): 325 mg via ORAL
  Filled 2014-06-21 (×2): qty 1

## 2014-06-21 MED ORDER — METOPROLOL TARTRATE 25 MG PO TABS
12.5000 mg | ORAL_TABLET | Freq: Two times a day (BID) | ORAL | Status: DC
  Administered 2014-06-21 – 2014-06-22 (×3): 12.5 mg via ORAL
  Filled 2014-06-21 (×3): qty 1

## 2014-06-21 MED ORDER — SEVELAMER CARBONATE 800 MG PO TABS
800.0000 mg | ORAL_TABLET | Freq: Four times a day (QID) | ORAL | Status: DC
  Administered 2014-06-21 – 2014-06-22 (×4): 800 mg via ORAL
  Filled 2014-06-21 (×4): qty 1

## 2014-06-21 MED ORDER — PANTOPRAZOLE SODIUM 40 MG PO TBEC
40.0000 mg | DELAYED_RELEASE_TABLET | Freq: Every day | ORAL | Status: DC
  Administered 2014-06-21 – 2014-06-22 (×2): 40 mg via ORAL
  Filled 2014-06-21 (×2): qty 1

## 2014-06-21 MED ORDER — POTASSIUM CHLORIDE CRYS ER 20 MEQ PO TBCR
20.0000 meq | EXTENDED_RELEASE_TABLET | Freq: Once | ORAL | Status: AC
  Administered 2014-06-21: 20 meq via ORAL
  Filled 2014-06-21: qty 1

## 2014-06-21 MED ORDER — SODIUM CHLORIDE 0.9 % IV SOLN
Freq: Once | INTRAVENOUS | Status: AC
  Administered 2014-06-21: 09:00:00 via INTRAVENOUS

## 2014-06-21 MED ORDER — ONDANSETRON HCL 4 MG PO TABS: 4.0000 mg | ORAL_TABLET | Freq: Four times a day (QID) | ORAL | Status: DC | PRN

## 2014-06-21 MED ORDER — ASPIRIN EC 325 MG PO TBEC
325.0000 mg | DELAYED_RELEASE_TABLET | Freq: Every day | ORAL | Status: DC
  Administered 2014-06-22: 325 mg via ORAL
  Filled 2014-06-21: qty 1

## 2014-06-21 MED ORDER — METHYLPREDNISOLONE SODIUM SUCC 125 MG IJ SOLR
60.0000 mg | Freq: Four times a day (QID) | INTRAMUSCULAR | Status: DC
  Administered 2014-06-21 – 2014-06-22 (×4): 60 mg via INTRAVENOUS
  Filled 2014-06-21 (×4): qty 2

## 2014-06-21 MED ORDER — ALUM & MAG HYDROXIDE-SIMETH 200-200-20 MG/5ML PO SUSP: 30.0000 mL | Freq: Four times a day (QID) | ORAL | Status: DC | PRN

## 2014-06-21 MED ORDER — ASPIRIN 81 MG PO CHEW
324.0000 mg | CHEWABLE_TABLET | Freq: Once | ORAL | Status: AC
  Administered 2014-06-21: 324 mg via ORAL

## 2014-06-21 MED ORDER — INSULIN ASPART 100 UNIT/ML ~~LOC~~ SOLN: 0.0000 [IU] | Freq: Three times a day (TID) | SUBCUTANEOUS | Status: DC

## 2014-06-21 MED ORDER — ONDANSETRON HCL 4 MG/2ML IJ SOLN: 4.0000 mg | Freq: Four times a day (QID) | INTRAMUSCULAR | Status: DC | PRN

## 2014-06-21 MED ORDER — DOCUSATE SODIUM 100 MG PO CAPS
100.0000 mg | ORAL_CAPSULE | Freq: Two times a day (BID) | ORAL | Status: DC
  Administered 2014-06-21 – 2014-06-22 (×2): 100 mg via ORAL
  Filled 2014-06-21 (×2): qty 1

## 2014-06-21 MED ORDER — ONDANSETRON HCL 4 MG/2ML IJ SOLN
INTRAMUSCULAR | Status: AC
Start: 2014-06-21 — End: 2014-06-21
  Administered 2014-06-21: 4 mg via INTRAVENOUS
  Filled 2014-06-21: qty 2

## 2014-06-21 MED ORDER — ASPIRIN 81 MG PO CHEW
CHEWABLE_TABLET | ORAL | Status: AC
  Administered 2014-06-21: 324 mg via ORAL
  Filled 2014-06-21: qty 4

## 2014-06-21 MED ORDER — ALBUTEROL SULFATE HFA 108 (90 BASE) MCG/ACT IN AERS: 2.0000 | INHALATION_SPRAY | RESPIRATORY_TRACT | Status: DC | PRN

## 2014-06-21 MED ORDER — SODIUM CHLORIDE 0.9 % IJ SOLN
3.0000 mL | Freq: Two times a day (BID) | INTRAMUSCULAR | Status: DC
  Administered 2014-06-21 (×2): 3 mL via INTRAVENOUS

## 2014-06-21 MED ORDER — CIPROFLOXACIN IN D5W 400 MG/200ML IV SOLN: 400.0000 mg | Freq: Two times a day (BID) | INTRAVENOUS | Status: DC

## 2014-06-21 MED ORDER — SODIUM CHLORIDE 0.9 % IV SOLN: INTRAVENOUS | Status: DC

## 2014-06-21 MED ORDER — HEPARIN SODIUM (PORCINE) 5000 UNIT/ML IJ SOLN
5000.0000 [IU] | Freq: Two times a day (BID) | INTRAMUSCULAR | Status: DC
  Administered 2014-06-21 – 2014-06-22 (×3): 5000 [IU] via SUBCUTANEOUS
  Filled 2014-06-21 (×3): qty 1

## 2014-06-21 MED ORDER — AMLODIPINE BESYLATE 5 MG PO TABS
5.0000 mg | ORAL_TABLET | Freq: Every day | ORAL | Status: DC
  Administered 2014-06-21 – 2014-06-22 (×2): 5 mg via ORAL
  Filled 2014-06-21 (×2): qty 1

## 2014-06-21 MED ORDER — INSULIN ASPART 100 UNIT/ML ~~LOC~~ SOLN: 0.0000 [IU] | Freq: Every day | SUBCUTANEOUS | Status: DC

## 2014-06-21 MED ORDER — PNEUMOCOCCAL VAC POLYVALENT 25 MCG/0.5ML IJ INJ
0.5000 mL | INJECTION | INTRAMUSCULAR | Status: AC
  Administered 2014-06-22: 0.5 mL via INTRAMUSCULAR
  Filled 2014-06-21: qty 0.5

## 2014-06-21 MED ORDER — ACETAMINOPHEN 325 MG PO TABS: 650.0000 mg | ORAL_TABLET | Freq: Four times a day (QID) | ORAL | Status: DC | PRN

## 2014-06-21 MED ORDER — ONDANSETRON HCL 4 MG/2ML IJ SOLN
4.0000 mg | Freq: Once | INTRAMUSCULAR | Status: AC
  Administered 2014-06-21: 4 mg via INTRAVENOUS

## 2014-06-21 MED ORDER — METRONIDAZOLE 500 MG PO TABS
500.0000 mg | ORAL_TABLET | Freq: Three times a day (TID) | ORAL | Status: DC
  Administered 2014-06-21 – 2014-06-22 (×3): 500 mg via ORAL
  Filled 2014-06-21 (×3): qty 1

## 2014-06-21 MED ORDER — TRAMADOL HCL 50 MG PO TABS
50.0000 mg | ORAL_TABLET | Freq: Two times a day (BID) | ORAL | Status: DC | PRN
Start: 2014-06-21 — End: 2014-06-22

## 2014-06-21 NOTE — Progress Notes (Signed)
*  PRELIMINARY RESULTS* Echocardiogram 2D Echocardiogram has been performed.  Kelsey Peterson 06/21/2014, 7:49 PM

## 2014-06-21 NOTE — Consult Note (Signed)
Gastroenterology Consultation  Referring Provider:     Dr Manuella Ghazi Primary Care Physician:  No primary care provider on file. Primary Gastroenterologist:  Dr. Allen Norris (new)      Reason for Consultation:     Nausea, vomiting, diarrhea  Date of Admission:  06/21/2014 Date of Consultation:  06/21/2014        HPI:   Kelsey Peterson is a 58 y.o. female admitted with chest pain, nausea, vomiting & diarrhea.  She states symptoms have been present for past 2 weeks.  She is unsure whether or not she has had a colonoscopy nor EGD.  She denies hearburn or indigestion.  When she tries to eat it feels like her sandwich got stuck in her throat.  She has odynophagia.  Denies constipation, rectal bleeding or melena.  She tells me she has had too numerous to count episodes of diarrhea and vomiting over the past 2 weeks. She has been evaluated by cardiology. It appears that they may be planning in upcoming cardiac cath.  She has history of end-stage renal disease with anemia of chronic disease.  She denies any abdominal pain or fever.  Denies NSAIDS.  Past Medical History  Diagnosis Date  . COPD (chronic obstructive pulmonary disease)   . ESRD on hemodialysis   . DM2 (diabetes mellitus, type 2)   . Hepatitis C   . Migraine with aura    Past Surgical History  Procedure Laterality Date  . Dialysis fistula creation      Prior to Admission medications   Medication Sig Start Date End Date Taking? Authorizing Provider  acetaminophen (TYLENOL) 325 MG tablet Take 650 mg by mouth every 6 (six) hours as needed for moderate pain or fever.   Yes Historical Provider, MD  albuterol (PROVENTIL HFA;VENTOLIN HFA) 108 (90 BASE) MCG/ACT inhaler Inhale 2 puffs into the lungs every 4 (four) hours as needed for wheezing or shortness of breath.    Yes Historical Provider, MD  amLODipine (NORVASC) 5 MG tablet Take 5 mg by mouth daily.   Yes Historical Provider, MD  Calcium Carbonate 500 MG CHEW Chew 1 tablet by mouth 2 (two) times  daily. Take 2 hours after meals.   Yes Historical Provider, MD  cinacalcet (SENSIPAR) 60 MG tablet Take 60 mg by mouth daily.   Yes Historical Provider, MD  esomeprazole (NEXIUM) 40 MG capsule Take 40 mg by mouth daily.    Yes Historical Provider, MD  ferrous sulfate 325 (65 FE) MG tablet Take 325 mg by mouth daily.   Yes Historical Provider, MD  furosemide (LASIX) 80 MG tablet Take 80 mg by mouth daily. Patient takes on non dialysis days.   Yes Historical Provider, MD  isosorbide mononitrate (IMDUR) 30 MG 24 hr tablet Take 30 mg by mouth daily.   Yes Historical Provider, MD  Lidocaine-Prilocaine, Bulk, 1.7-4.9 % CREA 1 application by Other route as needed (for tx). Apply to site 20-45 minutes before tx.   Yes Historical Provider, MD  sevelamer carbonate (RENVELA) 800 MG tablet Take 800 mg by mouth 4 (four) times daily. Takes 3 times daily with meal and 1 time with a snack.   Yes Historical Provider, MD  traMADol (ULTRAM) 50 MG tablet Take 50 mg by mouth 2 (two) times daily as needed for moderate pain.   Yes Historical Provider, MD    Family History  Problem Relation Age of Onset  . Heart disease Mother   . Hypertension Mother   There is no known family  history of colorectal carcinoma , liver disease, or inflammatory bowel disease.   History  Substance Use Topics  . Smoking status: Never Smoker   . Smokeless tobacco: Never Used  . Alcohol Use: No    Allergies as of 06/21/2014 - Review Complete 06/21/2014  Allergen Reaction Noted  . Morphine and related Hives 06/21/2014  . Other Rash 06/21/2014    Review of Systems:    All systems reviewed and negative except where noted in HPI.   Physical Exam:  Vital signs in last 24 hours: Temp:  [97.5 F (36.4 C)-97.6 F (36.4 C)] 97.5 F (36.4 C) (06/02 1301) Pulse Rate:  [70-73] 70 (06/02 1301) Resp:  [17-19] 19 (06/02 1301) BP: (148-170)/(81-101) 159/87 mmHg (06/02 1301) SpO2:  [93 %-100 %] 100 % (06/02 1301) Weight:  [94.666 kg (208  lb 11.2 oz)] 94.666 kg (208 lb 11.2 oz) (06/02 0840)   Body mass index is 38.16 kg/(m^2). General:   Alert,  Well-developed, obese, pleasant and cooperative in NAD Head:  Normocephalic and atraumatic. Eyes:  Sclera clear, no icterus.   Conjunctiva pink. Ears:  Normal auditory acuity. Nose:  No deformity, discharge, or lesions. Mouth:  No deformity or lesions,oropharynx pink & moist.  +edentulous. Neck:  Supple; no masses or thyromegaly. Lungs:  Respirations even and unlabored.  Clear throughout to auscultation.   No wheezes, crackles, or rhonchi. No acute distress. Heart:  Regular rate and rhythm; no murmurs, clicks, rubs, or gallops. Abdomen:  Normal bowel sounds.  No bruits.  Soft, non-tender and non-distended without masses, hepatosplenomegaly or hernias noted.  No guarding or rebound tenderness.  Negative Carnett sign.   Rectal:  Deferred. Msk:  Symmetrical without gross deformities.  Good, equal movement & strength bilaterally. Pulses:  Normal pulses noted. Extremities:  No clubbing or edema.  No cyanosis. Neurologic:  Alert and oriented x3;  grossly normal neurologically. Skin:  Intact without significant lesions or rashes.  No jaundice. Lymph Nodes:  No significant cervical adenopathy. Psych:  Alert, appears frustrated. Depressed mood and flat affect.  LAB RESULTS:  Recent Labs  06/21/14 0910  WBC 14.9*  HGB 7.5*  HCT 23.7*  PLT 324   BMET  Recent Labs  06/21/14 0910  NA 138  K 3.1*  CL 96*  CO2 29  GLUCOSE 87  BUN 21*  CREATININE 4.73*  CALCIUM 7.8*   LFT  Recent Labs  06/21/14 0910  PROT 6.9  ALBUMIN 3.0*  AST 37  ALT 28  ALKPHOS 101  BILITOT 1.5*  LIPASE 33   STUDIES: Dg Chest Portable 1 View  06/21/2014   CLINICAL DATA:  59 year old female with a history of renal failure. Presents to the ED with nausea vomiting and diarrhea for 2 weeks. Right-sided chest pain.  EXAM: PORTABLE CHEST - 1 VIEW  COMPARISON:  02/23/2014  FINDINGS: Cardiomediastinal  silhouette persistently enlarged.  Fullness in the hilar vessels.  Persisting mixed interstitial and airspace disease at of the right base. Left base not well evaluated.  Interval placement of left IJ approach split tip hemodialysis catheter, appearing to terminate in the superior atrium.  Unremarkable skeletal structures.  IMPRESSION: Interstitial and airspace disease at the right base, potentially edema or infection. Left base not well evaluated.  Cardiomegaly.  Interval placement of split tip hemodialysis catheter from right IJ approach appearing to terminate in the superior atrium.  Signed,  Dulcy Fanny. Earleen Newport, DO  [facility]  [facility]   Electronically Signed   By: York Cerise  Earleen Newport D.O.   On: 06/21/2014 10:54     Impression / Plan:   Kelsey Peterson is a 59 y.o. y/o female with 2 weeks of chest pain, nausea, vomiting, and diarrhea. She is currently undergoing cardiac evaluation. Differentials are wide and include acute protracted gastroenteritis, peptic ulcer disease, nausea and vomiting secondary to chronic kidney disease, cardiac etiology, poorly controlled GERD, or gastroparesis. Diarrhea could be secondary to autonomic diabetic diarrhea, malignancy or large polyp. Once cleared from a cardiac standpoint will consider EGD for intractable nausea and vomiting as well as colonoscopy for persistent diarrhea. In the interim, would consider stool studies to rule out acute bacterial causes including C. Difficile and empiric treatment with antibiotic therapy.  She should be on PPI & continue supportive measures with hydration & antiemetics.  I will discuss her care with Dr Lucilla Lame.  Thank you for involving me in the care of this patient.     LOS: 0 days  Vickey Huger, NP  06/21/2014, 2:07 PM [facility]  [place], [place] [address] Phone: [phone] Fax : [phone]

## 2014-06-21 NOTE — Consult Note (Signed)
Cardiology Consultation Note  Patient ID: Kelsey Peterson, MRN: [id number], DOB/AGE: 1955/11/28 59 y.o. Admit date: 06/21/2014   Date of Consult: 06/21/2014 Primary Physician: No primary care provider on file. Primary Cardiologist: New to [facility]  Chief Complaint: Chest, SOB, nausea x 2 weeks Reason for Consult: Chest pain and elevated troponin of 0.28 on HD  HPI: 59 y.o. female with h/o reported CAD per Care Everywhere (no prior caths), ESRD on HD, HTN, COPD, DM2, hepatitis C, and migraine disorder who presented to [facility] from her dialysis center this morning with complaints of 2 weeks of nausea, fatigue, SOB, and chest pain. She was found to have an elevated troponin of 0.28.   She was recently consulted on by [facility] on 06/11/2014 for increased SOB in the setting of needing needing AV graft revision. She was noted to have been having increased SOB over the past 4 months that were concerning for increased congestion in the setting of her ESRD. It was unclear if her symptoms were related to cardiac etiology vs her underlying renal disease with poor fluid balance in the setting of inadequate HD. Her EKG at that time was not concerning MI or ischemia. Echo was ordered, but has not been done to date. BNP was ordered but is not available in Care Everywhere. She was advised to return around 6/1, though did not.   She presented to her dialysis center for her session this morning with 2 week history fatigue, nausea, intermittent vomiting, SOB, and chest pain. She was noted to already be below her dry weight with no fluid to remove, thus she was sent to [facility] ED for further evaluation. Upon her arrival to [facility] ED she was noted to have mild intermittent chest pain that had resolved at that time. She was found to have a troponin of 0.28 in the setting of her ESRD. (Prior troponin back in December in the setting of her bacteremia of 0.08). CXR showed interstitial airspace disease, possible edema vs infection.  Upon me questioning her this afternoon she states she does not want to talk about her chest pain anymore. She only wants to go "upstairs." She will not let me turn the TV off. She ultimately lets me mute the TV. She is not currently having any chest pain.     Past Medical History  Diagnosis Date  . COPD (chronic obstructive pulmonary disease)   . ESRD on hemodialysis   . DM2 (diabetes mellitus, type 2)   . Hepatitis C   . Migraine with aura       Most Recent Cardiac Studies: none   Surgical History:  Past Surgical History  Procedure Laterality Date  . Dialysis fistula creation       Home Meds: Prior to Admission medications   Medication Sig Start Date End Date Taking? Authorizing Provider  acetaminophen (TYLENOL) 325 MG tablet Take 650 mg by mouth every 6 (six) hours as needed for moderate pain or fever.   Yes Historical Provider, MD  albuterol (PROVENTIL HFA;VENTOLIN HFA) 108 (90 BASE) MCG/ACT inhaler Inhale 2 puffs into the lungs every 4 (four) hours as needed for wheezing or shortness of breath.    Yes Historical Provider, MD  amLODipine (NORVASC) 5 MG tablet Take 5 mg by mouth daily.   Yes Historical Provider, MD  Calcium Carbonate 500 MG CHEW Chew 1 tablet by mouth 2 (two) times daily. Take 2 hours after meals.   Yes Historical Provider, MD  cinacalcet (SENSIPAR) 60 MG tablet Take 60  mg by mouth daily.   Yes Historical Provider, MD  esomeprazole (NEXIUM) 40 MG capsule Take 40 mg by mouth daily.    Yes Historical Provider, MD  ferrous sulfate 325 (65 FE) MG tablet Take 325 mg by mouth daily.   Yes Historical Provider, MD  furosemide (LASIX) 80 MG tablet Take 80 mg by mouth daily. Patient takes on non dialysis days.   Yes Historical Provider, MD  isosorbide mononitrate (IMDUR) 30 MG 24 hr tablet Take 30 mg by mouth daily.   Yes Historical Provider, MD  Lidocaine-Prilocaine, Bulk, 6.0-6.3 % CREA 1 application by Other route as needed (for tx). Apply to site 20-45 minutes before  tx.   Yes Historical Provider, MD  sevelamer carbonate (RENVELA) 800 MG tablet Take 800 mg by mouth 4 (four) times daily. Takes 3 times daily with meal and 1 time with a snack.   Yes Historical Provider, MD  traMADol (ULTRAM) 50 MG tablet Take 50 mg by mouth 2 (two) times daily as needed for moderate pain.   Yes Historical Provider, MD    Inpatient Medications:     Allergies:  Allergies  Allergen Reactions  . Morphine And Related Hives  . Other Rash    Blood Pressure Pill (Pt doesn't remember name)     History   Social History  . Marital Status: Single    Spouse Name: N/A  . Number of Children: N/A  . Years of Education: N/A   Occupational History  . Not on file.   Social History Main Topics  . Smoking status: Never Smoker   . Smokeless tobacco: Never Used  . Alcohol Use: No  . Drug Use: No  . Sexual Activity: Not on file   Other Topics Concern  . Not on file   Social History Narrative  . No narrative on file     Family History  Problem Relation Age of Onset  . Heart disease Mother   . Hypertension Mother      Review of Systems: Review of Systems  Constitutional: Positive for malaise/fatigue. Negative for fever, chills, weight loss and diaphoresis.  HENT: Negative for sore throat.   Eyes: Negative for photophobia, pain, discharge and redness.  Respiratory: Positive for shortness of breath. Negative for cough, hemoptysis, sputum production and wheezing.   Cardiovascular: Positive for chest pain. Negative for palpitations, orthopnea, claudication, leg swelling and PND.  Gastrointestinal: Positive for nausea and vomiting. Negative for heartburn, abdominal pain, diarrhea, constipation, blood in stool and melena.  Genitourinary: Negative for hematuria.  Musculoskeletal: Negative for falls.  Skin: Negative for itching and rash.  Neurological: Positive for weakness. Negative for dizziness, sensory change and speech change.  Psychiatric/Behavioral: The patient is  not nervous/anxious.   All other systems reviewed and are negative.    Labs:  Recent Labs  06/21/14 0910  TROPONINI 0.28*   Lab Results  Component Value Date   WBC 14.9* 06/21/2014   HGB 7.5* 06/21/2014   HCT 23.7* 06/21/2014   MCV 78.2* 06/21/2014   PLT 324 06/21/2014     Recent Labs Lab 06/21/14 0910  NA 138  K 3.1*  CL 96*  CO2 29  BUN 21*  CREATININE 4.73*  CALCIUM 7.8*  PROT 6.9  BILITOT 1.5*  ALKPHOS 101  ALT 28  AST 37  GLUCOSE 87   No results found for: CHOL, HDL, LDLCALC, TRIG No results found for: DDIMER  Radiology/Studies:  Dg Chest Portable 1 View  06/21/2014   CLINICAL DATA:  59 year old  female with a history of renal failure. Presents to the ED with nausea vomiting and diarrhea for 2 weeks. Right-sided chest pain.  EXAM: PORTABLE CHEST - 1 VIEW  COMPARISON:  02/23/2014  FINDINGS: Cardiomediastinal silhouette persistently enlarged.  Fullness in the hilar vessels.  Persisting mixed interstitial and airspace disease at of the right base. Left base not well evaluated.  Interval placement of left IJ approach split tip hemodialysis catheter, appearing to terminate in the superior atrium.  Unremarkable skeletal structures.  IMPRESSION: Interstitial and airspace disease at the right base, potentially edema or infection. Left base not well evaluated.  Cardiomegaly.  Interval placement of split tip hemodialysis catheter from right IJ approach appearing to terminate in the superior atrium.  Signed,  Dulcy Fanny. Earleen Newport, DO  [facility]  [facility]   Electronically Signed   By: Corrie Mckusick D.O.   On: 06/21/2014 10:54    EKG: NSR, 77 bpm, no significant st/t changes   Weights: Filed Weights   06/21/14 0840  Weight: 208 lb 11.2 oz (94.666 kg)     Physical Exam: Blood pressure 169/97, pulse 71, temperature 97.6 F (36.4 C), temperature source Oral, resp. rate 17, height 5\' 2"  (1.575 m), weight 208 lb 11.2 oz (94.666  kg), SpO2 100 %. Body mass index is 38.16 kg/(m^2). General: Well developed, well nourished, in no acute distress. Head: Normocephalic, atraumatic, sclera non-icteric, no xanthomas, nares are without discharge.  Neck: Negative for carotid bruits. JVD not elevated. Lungs: Clear bilaterally to auscultation without wheezes, rales, or rhonchi. Breathing is unlabored. Heart: RRR with S1 S2. II/VI systolic murmur. No rubs or gallops appreciated. Abdomen: Obese, soft, non-tender, non-distended with normoactive bowel sounds. No hepatomegaly. No rebound/guarding. No obvious abdominal masses. Msk:  Strength and tone appear normal for age. Extremities: No clubbing or cyanosis. No edema.  Distal pedal pulses are 2+ and equal bilaterally. Neuro: Alert and oriented X 3. No facial asymmetry. No focal deficit. Moves all extremities spontaneously. Psych:  Responds to questions appropriately with a normal affect.    Assessment and Plan:  59 y.o. female with h/o reported CAD per Care Everywhere (no prior caths), ESRD on HD, HTN, COPD, DM2, hepatitis C, and migraine disorder who presented to [facility] from her dialysis center this morning with complaints of 2 weeks of nausea, fatigue, SOB, and chest pain. She was found to have an elevated troponin of 0.28.  1. Elevated troponin: -Of uncertain etiology at this time. Given her presenting symptoms of fatigue, nausea, vomiting, SOB, and intermittent chest pain x 2 weeks, the fact that she was below dry weight at the time of dialysis today this could represent noncardiac etiology/GI vs ACS with possible RCA involvement  -Given her ESRD she will likely have some degree troponinemia as seen in 12/2013 when she had sepsis and bacteremia with a troponin of 0.08 -Would continue to trend troponin levels. If levels significantly trend upwards would place on heparin gtt per pharmacy, make NPO at midnight, and could plan for possible cardiac cath on Friday, 6/3 if ok with Renal    -Nonetheless, she would require ischemic evaluation and may not truly be able to trust nuclear stress test in her given her comorbidities. May be better suited for inpatient cath vs outpatient cath -She did have an echo ordered per [facility] cardiology though this was yet to be completed. Will check an echo to evaluate LV function and wall motion -Check BNP -She has been SOB for quite sometime now (4  months). Initially unclear cardiac vs renal, though patient presented with worsening symptoms and under dry weight, possibly making cardiac more likely   2. HTN: -Per Care Everywhere she is on Norvasc 5 mg and Imdur 30 mg -Uncontrolled -Will add Lopressor 12.5 mg bid with hold parameters   3. ESRD on HD: -No HD today given below dry weight -K+ 3.1, will give 20 mEq-->remaining per renal   4. COPD: -CXR with possible edema vs infection -She is below dry weight as above -WBC 14.9, possible infection vs inflammatory -Consider exacerbation with ABX coverage   5. DM2: -Per IM  6. Hepatitis C  7. Outpatient medications: -Will consult pharmacy to reconcile medications   Signed, Christell Faith, PA-C Pager: [phone] 06/21/2014, 12:45 PM

## 2014-06-21 NOTE — ED Notes (Signed)
Patient presents to the ED via EMS from dialysis.  Patient presents with nausea, vomiting and diarrhea x 2 weeks, right sided chest pain and difficulty breathing.  Patient has slight audible wheezes.  Dialysis staff concerned about patient's elevated blood pressure and told EMS that patient was "dry".  Patient is very hard of hearing and is a poor historian.

## 2014-06-21 NOTE — Progress Notes (Signed)
Per ED nurse, patient has received 1L of fluids in ED. Patient also has an order to run fluids at 75 continuously. Notified Dr. Manuella Ghazi due to patient being on dialysis. MD stated to discontinue fluids.

## 2014-06-21 NOTE — ED Provider Notes (Signed)
[facility] Emergency Department Provider Note  Time seen: 8:47 AM  I have reviewed the triage vital signs and the nursing notes.   HISTORY  Chief Complaint Chest Pain    HPI Kelsey Peterson is a 59 y.o. female with a past medical history of COPD, diabetes, hepatitis, end-stage renal disease on hemodialysis presents to the emergency department with 2 weeksof not feeling well. The patient states for the past 2 weeks she has been nauseated with occasional vomiting, occasional diarrhea, has been feeling very weak with occasional chest pains. She went to dialysis this morning, per dialysis she was already below weight with no fluid to remove. So they sent the patient to the emergency department for further evaluation for likely dehydration. Patient states mild chest pain, dull sensation, happening intermittently. None currently. Mild abdominal cramping, intermittent, mild currently.    Past Medical History  Diagnosis Date  . COPD (chronic obstructive pulmonary disease)   . Renal disorder   . Diabetes mellitus without complication   . Hepatitis C   . Migraine with aura     There are no active problems to display for this patient.   Past Surgical History  Procedure Laterality Date  . Dialysis fistula creation      No current outpatient prescriptions on file.  Allergies Review of patient's allergies indicates no known allergies.  No family history on file.  Social History History  Substance Use Topics  . Smoking status: Never Smoker   . Smokeless tobacco: Never Used  . Alcohol Use: No    Review of Systems Constitutional: Negative for fever. Cardiovascular: Positive for intermittent chest pain. Respiratory: Positive for shortness of breath. Gastrointestinal: Positive for intermittent abdominal pain, nausea, vomiting, and diarrhea. Genitourinary: Negative for dysuria. Does produce urine approximately once per day. Skin: Negative for  rash. Neurological: Negative for headaches, focal weakness or numbness. 10-point ROS otherwise negative.  ____________________________________________   PHYSICAL EXAM:  VITAL SIGNS: ED Triage Vitals  Enc Vitals Group     BP 06/21/14 0840 170/101 mmHg     Pulse Rate 06/21/14 0840 73     Resp --      Temp 06/21/14 0840 97.6 F (36.4 C)     Temp Source 06/21/14 0840 Oral     SpO2 06/21/14 0840 93 %     Weight 06/21/14 0840 208 lb 11.2 oz (94.666 kg)     Height 06/21/14 0840 5\' 2"  (1.575 m)     Head Cir --      Peak Flow --      Pain Score 06/21/14 0841 8     Pain Loc --      Pain Edu? --      Excl. in [place]? --     Constitutional: Alert and oriented. Well appearing  ENT   Head: Normocephalic and atraumatic.   Mouth/Throat: Mucous membranes are moist. Cardiovascular: Normal rate, regular rhythm. No murmurs Respiratory: Normal respiratory effort without tachypnea nor retractions. Breath sounds are clear  Gastrointestinal: Soft and nontender. No distention.  No abdominal tenderness to palpation. Musculoskeletal: Nontender with normal range of motion in all extremities.  Neurologic:  Normal speech and language. No gross focal neurologic deficits Skin:  Skin is warm, dry and intact.  Psychiatric: Mood and affect are normal. Speech and behavior are normal.   ____________________________________________    EKG  EKG shows what appears to be sinus arrhythmia at 78 bpm, narrow QRS, normal axis, prolonged QTC of 533. Nonspecific ST changes present.  ____________________________________________    INITIAL IMPRESSION / ASSESSMENT AND PLAN / ED COURSE  Pertinent labs & imaging results that were available during my care of the patient were reviewed by me and considered in my medical decision making (see chart for details).  Patient with a complaints of not feeling well for the past 2 weeks. Appears to be dry at dialysis. We will check labs, likely IV hydrate the patient, and  treat nausea.  ----------------------------------------- 10:33 AM on 06/21/2014 -----------------------------------------  Labs show an elevated white blood cell count as well as an elevated troponin of 0.28. Patient has end-stage renal disease on hemodialysis but in reviewing her records she has never had a troponin this high even when her creatinine was considerably higher. Given her intermittent chest pains, nausea, vomiting, diarrhea we will admit the patient for IV hydration, and cardiac evaluation/monitoring. Patient agreeable to plan.  ____________________________________________   FINAL CLINICAL IMPRESSION(S) / ED DIAGNOSES  Dehydration Chest pain  Harvest Dark, MD 06/21/14 1035

## 2014-06-21 NOTE — H&P (Signed)
[facility] [place] NAME: Kelsey Peterson    MR#:  [id number]  DATE OF BIRTH:  12-06-1955  DATE OF ADMISSION:  06/21/2014  PRIMARY CARE PHYSICIAN: No primary care provider on file.   REQUESTING/REFERRING PHYSICIAN: Harvest Dark, MD  CHIEF COMPLAINT:   Chief Complaint  Patient presents with  . Chest Pain  nausea, vomiting and diarrhea  HISTORY OF PRESENT ILLNESS:  Kelsey Peterson  is a 59 y.o. female with a known history of COPD, diabetes, hepatitis, end-stage renal disease on hemodialysis presents to the emergency department with 2 weeksof not feeling well. The patient states for the past 2 weeks she has been nauseated with occasional vomiting, diarrhea, feeling very weak with occasional chest pains. She went to dialysis this morning, per dialysis she was already below dry weight with no fluid to remove. So they sent the patient to the emergency department for further evaluation for likely dehydration. Patient had mild chest pain, dull sensation, happening intermittently. None currently. Also reports mild abdominal cramping, intermittent in nature.  She was a difficult historian and would not follow a certain commands.  Not sure whether she has any underlying mild mental retardation.  PAST MEDICAL HISTORY:   Past Medical History  Diagnosis Date  . COPD (chronic obstructive pulmonary disease)   . ESRD on hemodialysis   . DM2 (diabetes mellitus, type 2)   . Hepatitis C   . Migraine with aura   . HOH (hard of hearing)     PAST SURGICAL HISTORY:   Past Surgical History  Procedure Laterality Date  . Dialysis fistula creation    . Tubal ligation      SOCIAL HISTORY:   History  Substance Use Topics  . Smoking status: Never Smoker   . Smokeless tobacco: Never Used  . Alcohol Use: No    FAMILY HISTORY:   Family History  Problem Relation Age of Onset  . Heart disease Mother   . Hypertension Mother     DRUG  ALLERGIES:   Allergies  Allergen Reactions  . Morphine And Related Hives  . Other Rash    Blood Pressure Pill (Pt doesn't remember name)     REVIEW OF SYSTEMS:   Review of Systems  Constitutional: Positive for malaise/fatigue. Negative for fever, weight loss and diaphoresis.  HENT: Negative for ear discharge, ear pain, hearing loss, nosebleeds, sore throat and tinnitus.   Eyes: Negative for blurred vision and pain.  Respiratory: Negative for cough, hemoptysis, shortness of breath and wheezing.   Cardiovascular: Positive for chest pain. Negative for palpitations, orthopnea and leg swelling.  Gastrointestinal: Positive for nausea, vomiting, abdominal pain and diarrhea. Negative for heartburn, constipation and blood in stool.  Genitourinary: Negative for dysuria, urgency and frequency.  Musculoskeletal: Negative for myalgias and back pain.  Skin: Negative for itching and rash.  Neurological: Positive for weakness. Negative for dizziness, tingling, tremors, focal weakness, seizures and headaches.  Psychiatric/Behavioral: Negative for depression. The patient is not nervous/anxious.     MEDICATIONS AT HOME:   Prior to Admission medications   Medication Sig Start Date End Date Taking? Authorizing Provider  acetaminophen (TYLENOL) 325 MG tablet Take 650 mg by mouth every 6 (six) hours as needed for moderate pain or fever.   Yes Historical Provider, MD  albuterol (PROVENTIL HFA;VENTOLIN HFA) 108 (90 BASE) MCG/ACT inhaler Inhale 2 puffs into the lungs every 4 (four) hours as needed for wheezing or shortness of breath.    Yes Historical  Provider, MD  amLODipine (NORVASC) 5 MG tablet Take 5 mg by mouth daily.   Yes Historical Provider, MD  Calcium Carbonate 500 MG CHEW Chew 1 tablet by mouth 2 (two) times daily. Take 2 hours after meals.   Yes Historical Provider, MD  cinacalcet (SENSIPAR) 60 MG tablet Take 60 mg by mouth daily.   Yes Historical Provider, MD  esomeprazole (NEXIUM) 40 MG capsule  Take 40 mg by mouth daily.    Yes Historical Provider, MD  ferrous sulfate 325 (65 FE) MG tablet Take 325 mg by mouth daily.   Yes Historical Provider, MD  furosemide (LASIX) 80 MG tablet Take 80 mg by mouth daily. Patient takes on non dialysis days.   Yes Historical Provider, MD  isosorbide mononitrate (IMDUR) 30 MG 24 hr tablet Take 30 mg by mouth daily.   Yes Historical Provider, MD  Lidocaine-Prilocaine, Bulk, 8.8-5.0 % CREA 1 application by Other route as needed (for tx). Apply to site 20-45 minutes before tx.   Yes Historical Provider, MD  sevelamer carbonate (RENVELA) 800 MG tablet Take 800 mg by mouth 4 (four) times daily. Takes 3 times daily with meal and 1 time with a snack.   Yes Historical Provider, MD  traMADol (ULTRAM) 50 MG tablet Take 50 mg by mouth 2 (two) times daily as needed for moderate pain.   Yes Historical Provider, MD      VITAL SIGNS:  Blood pressure 159/87, pulse 70, temperature 97.5 F (36.4 C), temperature source Oral, resp. rate 19, height 5\' 2"  (1.575 m), weight 94.666 kg (208 lb 11.2 oz), SpO2 100 %.  PHYSICAL EXAMINATION:  Physical Exam  GENERAL:  59 y.o.-year-old patient lying in the bed with no acute distress.  EYES: Pupils equal, round, reactive to light and accommodation. No scleral icterus. Extraocular muscles intact.  HEENT: Head atraumatic, normocephalic. Oropharynx and nasopharynx clear.  NECK:  Supple, no jugular venous distention. No thyroid enlargement, no tenderness.  LUNGS: Normal breath sounds bilaterally, no wheezing, rales,rhonchi or crepitation. No use of accessory muscles of respiration.  CARDIOVASCULAR: S1, S2 normal. II/VI systolic murmur. No rubs, or gallops.  ABDOMEN: Soft, nontender, nondistended. Bowel sounds present. No organomegaly or mass.  EXTREMITIES: No pedal edema, cyanosis, or clubbing.  NEUROLOGIC: Cranial nerves II through XII are intact. Muscle strength 5/5 in all extremities. Sensation intact. Gait not checked.   PSYCHIATRIC: The patient is alert and oriented x 3.  SKIN: No obvious rash, lesion, or ulcer.   LABORATORY PANEL:   CBC  Recent Labs Lab 06/21/14 0910  WBC 14.9*  HGB 7.5*  HCT 23.7*  PLT 324   ------------------------------------------------------------------------------------------------------------------  Chemistries   Recent Labs Lab 06/21/14 0910  NA 138  K 3.1*  CL 96*  CO2 29  GLUCOSE 87  BUN 21*  CREATININE 4.73*  CALCIUM 7.8*  AST 37  ALT 28  ALKPHOS 101  BILITOT 1.5*   ------------------------------------------------------------------------------------------------------------------  Cardiac Enzymes  Recent Labs Lab 06/21/14 0910  TROPONINI 0.28*   ------------------------------------------------------------------------------------------------------------------  RADIOLOGY:  Dg Chest Portable 1 View  06/21/2014   CLINICAL DATA:  59 year old female with a history of renal failure. Presents to the ED with nausea vomiting and diarrhea for 2 weeks. Right-sided chest pain.  EXAM: PORTABLE CHEST - 1 VIEW  COMPARISON:  02/23/2014  FINDINGS: Cardiomediastinal silhouette persistently enlarged.  Fullness in the hilar vessels.  Persisting mixed interstitial and airspace disease at of the right base. Left base not well evaluated.  Interval placement of left IJ approach split  tip hemodialysis catheter, appearing to terminate in the superior atrium.  Unremarkable skeletal structures.  IMPRESSION: Interstitial and airspace disease at the right base, potentially edema or infection. Left base not well evaluated.  Cardiomegaly.  Interval placement of split tip hemodialysis catheter from right IJ approach appearing to terminate in the superior atrium.  Signed,  Dulcy Fanny. Earleen Newport, DO  [facility]  [facility]   Electronically Signed   By: Corrie Mckusick D.O.   On: 06/21/2014 10:54    IMPRESSION AND PLAN:   59 y.o. female with K/H/O  CAD per Care Everywhere (no prior caths), ESRD on HD, HTN, COPD, DM2, hepatitis C, and migraine disorder who presented to [facility] from her dialysis center this morning with complaints of 2 weeks of nausea, fatigue, SOB, and chest pain. She was found to have an elevated troponin of 0.28.  * Nausea, vomiting and diarrhea: Consult gastroenterology.  Obtain Stool studies and monitor her on IV hydration.  Symptomatic management at this time.  Check C. difficile  * Elevated troponin: Of uncertain etiology at this time.  Consult cardiology, obtain 2-D echo, on return on telemetry and rule out with serial troponins  * ESRD on HD: We will consult nephrology for dialysis need.  Did not get HD today given below dry weight  * Hypokalemia: K+ 3.1, will replete and recheck  * HTN: Resume home medications - Norvasc 5 mg and Imdur 30 mg -Will add Lopressor 12.5 mg bid with hold parameters per cardiology  * COPD: CXR with possible edema vs infection. WBC 14.9, possible infection vs inflammatory - We will cover with empiric antibiotic and IV steroids at this time - already on Cipro and Flagyl from GI which should cover  * DM2: Sliding scale insulin at this time   All the records are reviewed and case discussed with ED provider. Management plans discussed with the patient, family and they are in agreement.  CODE STATUS: Full code  TOTAL TIME TAKING CARE OF THIS PATIENT: 55 minutes.    [facility], Nohemy Koop M.D on 06/21/2014 at 4:48 PM  Between 7am to 6pm - Pager - [phone]  After 6pm go to [email or web address] - password [email or web address] [facility]  [facility]  Office  [phone]  CC: Primary care physician; No primary care provider on file.

## 2014-06-22 ENCOUNTER — Telehealth: Payer: Self-pay | Admitting: Urgent Care

## 2014-06-22 DIAGNOSIS — R197 Diarrhea, unspecified: Secondary | ICD-10-CM

## 2014-06-22 DIAGNOSIS — K529 Noninfective gastroenteritis and colitis, unspecified: Secondary | ICD-10-CM | POA: Diagnosis not present

## 2014-06-22 DIAGNOSIS — R079 Chest pain, unspecified: Secondary | ICD-10-CM | POA: Insufficient documentation

## 2014-06-22 LAB — BASIC METABOLIC PANEL
Anion gap: 12 (ref 5–15)
BUN: 28 mg/dL — ABNORMAL HIGH (ref 6–20)
CO2: 28 mmol/L (ref 22–32)
Calcium: 7.6 mg/dL — ABNORMAL LOW (ref 8.9–10.3)
Chloride: 98 mmol/L — ABNORMAL LOW (ref 101–111)
Creatinine, Ser: 5.97 mg/dL — ABNORMAL HIGH (ref 0.44–1.00)
GFR calc Af Amer: 8 mL/min — ABNORMAL LOW (ref >60)
GFR calc non Af Amer: 7 mL/min — ABNORMAL LOW (ref >60)
Glucose, Bld: 142 mg/dL — ABNORMAL HIGH (ref 65–99)
Potassium: 3.9 mmol/L (ref 3.5–5.1)
Sodium: 138 mmol/L (ref 135–145)

## 2014-06-22 LAB — CBC
HCT: 22.1 % — ABNORMAL LOW (ref 35.0–47.0)
Hemoglobin: 7.1 g/dL — ABNORMAL LOW (ref 12.0–16.0)
MCH: 24.8 pg — ABNORMAL LOW (ref 26.0–34.0)
MCHC: 32 g/dL (ref 32.0–36.0)
MCV: 77.7 fL — ABNORMAL LOW (ref 80.0–100.0)
Platelets: 298 10*3/uL (ref 150–440)
RBC: 2.84 MIL/uL — AB (ref 3.80–5.20)
RDW: 20.3 % — ABNORMAL HIGH (ref 11.5–14.5)
WBC: 11 10*3/uL (ref 3.6–11.0)

## 2014-06-22 LAB — TROPONIN I: Troponin I: 0.24 ng/mL — ABNORMAL HIGH (ref <0.031)

## 2014-06-22 LAB — GLUCOSE, CAPILLARY: GLUCOSE-CAPILLARY: 141 mg/dL — AB (ref 65–99)

## 2014-06-22 NOTE — Care Management (Signed)
Patient has no transportation home.  Is known to medicaid transportation and [facility] will transport patient home

## 2014-06-22 NOTE — Progress Notes (Addendum)
Patient: Kelsey Peterson / Admit Date: 06/21/2014 / Date of Encounter: 06/22/2014, 8:58 AM   Subjective: Feels better this morning. No further chest pain since her admission. Breathing better. No further nausea or vomiting. No diarrhea. Echo showed EF 60%, no wall motion abnormalities, GR1DD, mild aortic regurgitation, calcified mitral annulus with moderate regurgitation, no atrial septal defect or patent foramen ovale with identified. Mild to moderately dilated LV/LA. Troponin with plateau 0.28-->0.28-->0.23-->0.24. There appears to be significant cognitive delay.   Review of Systems: Review of Systems  Constitutional: Positive for weight loss and malaise/fatigue. Negative for fever, chills and diaphoresis.  Eyes: Negative for pain, discharge and redness.  Respiratory: Negative for cough, hemoptysis, sputum production, shortness of breath and wheezing.   Cardiovascular: Negative for chest pain, palpitations, orthopnea, claudication, leg swelling and PND.  Gastrointestinal: Negative for nausea, vomiting, diarrhea and constipation.  Skin: Negative for rash.  Neurological: Positive for weakness. Negative for speech change.  Psychiatric/Behavioral: The patient is not nervous/anxious.   All other systems reviewed and are negative.   Objective: Telemetry: NSR, 60's, 6 beats of NSVT, occasional PACs Physical Exam: Blood pressure 152/86, pulse 69, temperature 98.2 F (36.8 C), temperature source Oral, resp. rate 19, height 5\' 2"  (1.575 m), weight 199 lb 12.8 oz (90.629 kg), SpO2 98 %. Body mass index is 36.53 kg/(m^2). General: Well developed, well nourished, in no acute distress. Head: Normocephalic, atraumatic, sclera non-icteric, no xanthomas, nares are without discharge. Neck: Negative for carotid bruits. JVP not elevated. Lungs: Clear bilaterally to auscultation without wheezes, rales, or rhonchi. Breathing is unlabored. Heart: RRR S1 S2. II/VI systolic murmur. No rubs or gallops.   Abdomen: Obese, soft, non-tender, non-distended with normoactive bowel sounds. No rebound/guarding. Extremities: No clubbing or cyanosis. No edema. Distal pedal pulses are 2+ and equal bilaterally. Neuro: Alert and oriented X 3. Moves all extremities spontaneously. Psych:  Responds to questions appropriately with a normal affect.   Intake/Output Summary (Last 24 hours) at 06/22/14 0858 Last data filed at 06/22/14 0605  Gross per 24 hour  Intake      3 ml  Output     25 ml  Net    -22 ml    Inpatient Medications:  . amLODipine  5 mg Oral Daily  . aspirin EC  325 mg Oral Daily  . ciprofloxacin  500 mg Oral Daily  . docusate sodium  100 mg Oral BID  . ferrous sulfate  325 mg Oral Daily  . heparin subcutaneous  5,000 Units Subcutaneous Q12H  . insulin aspart  0-5 Units Subcutaneous QHS  . insulin aspart  0-9 Units Subcutaneous TID WC  . isosorbide mononitrate  30 mg Oral Daily  . methylPREDNISolone (SOLU-MEDROL) injection  60 mg Intravenous Q6H  . metoprolol tartrate  12.5 mg Oral BID  . metroNIDAZOLE  500 mg Oral 3 times per day  . pantoprazole  40 mg Oral Daily  . pneumococcal 23 valent vaccine  0.5 mL Intramuscular Tomorrow-1000  . sevelamer carbonate  800 mg Oral QID  . sodium chloride  3 mL Intravenous Q12H   Infusions:    Labs:  Recent Labs  06/21/14 0910 06/22/14 0434  NA 138 138  K 3.1* 3.9  CL 96* 98*  CO2 29 28  GLUCOSE 87 142*  BUN 21* 28*  CREATININE 4.73* 5.97*  CALCIUM 7.8* 7.6*    Recent Labs  06/21/14 0910  AST 37  ALT 28  ALKPHOS 101  BILITOT 1.5*  PROT 6.9  ALBUMIN  3.0*    Recent Labs  06/21/14 0910 06/22/14 0434  WBC 14.9* 11.0  NEUTROABS 11.9*  --   HGB 7.5* 7.1*  HCT 23.7* 22.1*  MCV 78.2* 77.7*  PLT 324 298    Recent Labs  06/21/14 0910 06/21/14 1428 06/21/14 1917 06/22/14 0142  TROPONINI 0.28* 0.28* 0.23* 0.24*   Invalid input(s): POCBNP  Recent Labs  06/21/14 1428  HGBA1C 5.8     Weights: Filed Weights    06/21/14 0840 06/22/14 0407  Weight: 208 lb 11.2 oz (94.666 kg) 199 lb 12.8 oz (90.629 kg)     Radiology/Studies:  Dg Chest Portable 1 View  06/21/2014   CLINICAL DATA:  59 year old female with a history of renal failure. Presents to the ED with nausea vomiting and diarrhea for 2 weeks. Right-sided chest pain.  EXAM: PORTABLE CHEST - 1 VIEW  COMPARISON:  02/23/2014  FINDINGS: Cardiomediastinal silhouette persistently enlarged.  Fullness in the hilar vessels.  Persisting mixed interstitial and airspace disease at of the right base. Left base not well evaluated.  Interval placement of left IJ approach split tip hemodialysis catheter, appearing to terminate in the superior atrium.  Unremarkable skeletal structures.  IMPRESSION: Interstitial and airspace disease at the right base, potentially edema or infection. Left base not well evaluated.  Cardiomegaly.  Interval placement of split tip hemodialysis catheter from right IJ approach appearing to terminate in the superior atrium.  Signed,  Dulcy Fanny. Earleen Newport, DO  [facility]  [facility]   Electronically Signed   By: Corrie Mckusick D.O.   On: 06/21/2014 10:54     Assessment and Plan  59 y.o. female with h/o reported CAD per Care Everywhere (no prior caths), ESRD on HD, HTN, COPD, DM2, hepatitis C, and migraine disorder who presented to [facility] from her dialysis center this morning with complaints of 2 weeks of nausea, fatigue, SOB, and chest pain. She was found to have an elevated troponin of 0.28.  1. Elevated troponin: -Troponin with flat trend of 0.28-->0.28-->0.23-->0.24 -Echo with normal LV function, no wall motion abnormalities, GR1DD, mild to moderately dilated LA/LV, moderate MR -Given the above trended findings and resolution of her symptoms, unlikely ischemic in etiology  -Certainly could be supply demand ischemia in the setting of her anemia with a hgb of 7.5-->7.1 (chronic) -With normal LV function and  wall motion acute ischemic event is unlikely  -Could pursue with outpatient nuclear stress test (with [place] or [facility]), given normal LV function without WMA no need for inpatient ischemic evaluation at this time  2. Nausea/vomiting/diarrhea: -GI has been consulted -Studies pending -On Flagyl  3. HTN: -Slightly hypertensive -Add prn hydralazine  -Norvasc 5 mg and Imdur 30 mg -Continue Lopressor 12.5 mg bid with hold parameters   3. ESRD on HD: -No HD on 6/2 2/2 below dry weight  4. COPD: -CXR with possible edema vs infection -She is below dry weight as above -WBC 14.9-->11.0, possible infection vs inflammatory  5. DM2: -Per IM  6. Hepatitis C  7. Cognitive delay   Gladstone Lighter Pager: [phone] 06/22/2014, 8:58 AM     Attending Note Patient seen and examined, agree with detailed note above,  Patient presentation and plan discussed on rounds.   Doing well this AM, No further inpatient cardiac workup needed Outpt follow up  Signed: Esmond Plants  M.D., Ph.D.

## 2014-06-22 NOTE — Discharge Instructions (Signed)
Keep your schedule as before for HD TUE-THUR-Sat Keep your cardiology appt as out pt. appt is scheduled in June Renal diet

## 2014-06-22 NOTE — Telephone Encounter (Signed)
Pt was discharged [facility].  Needs FU appt for N/V/D with me in 2 weeks. Thanks

## 2014-06-22 NOTE — Progress Notes (Signed)
Noted consult for inpatient diabetes coordinator for "DIABETES". Patient has a history of diabetes which is diet controlled as she has no outpatient diabetes medications listed on home medication list. A1C is 5.8% which indicates great glycemic control. Patient is currently ordered steroids as an inpatient and ordered Novolog sensitive correction scale if needed when parameters meet. Glucose has ranged from 87-141 mg/dl since being admitted. Will continue to follow while inpatient.  Thanks, Barnie Alderman, RN, MSN, CCRN, CDE Diabetes Coordinator Inpatient Diabetes Program [phone] (Team Pager from [place] to [place]) [phone] ([facility] office) [phone] [facility] office) [phone] [facility] office)

## 2014-06-22 NOTE — Progress Notes (Signed)
Pt alert and oriented x4, no complaints of pain or discomfort.  Bed in low position, call bell within reach.  Bed alarms on and functioning.  Assessment done and charted.  Will continue to monitor and do hourly rounding throughout the shift 

## 2014-06-22 NOTE — Progress Notes (Signed)
Pt discharged to home via taxi cab.Discharge: Pt d/c from room via wheelchair, . Discharge instructions given to the patient  No questions from pt, reintegrated to the pt to call or go to the ED for chest discomfort. Pt dressed in street clothes . IV d/ced, tele removed and no complaints of pain or discomfort.

## 2014-06-22 NOTE — Discharge Summary (Addendum)
[place] at [place] NAME: Kelsey Peterson    MR#:  [id number]  DATE OF BIRTH:  03-Jun-1955  DATE OF ADMISSION:  06/21/2014 ADMITTING PHYSICIAN: Max Sane, MD  DATE OF DISCHARGE: 06/22/14  PRIMARY CARE PHYSICIAN: No primary care provider on file.    ADMISSION DIAGNOSIS:  Diarrhea [R19.7] Nausea [R11.0] Chest pain, unspecified chest pain type [R07.9]  DISCHARGE DIAGNOSIS:  Acute Gastroenteritis with vomiting-resolved ESRD on HD Chest pain -resolved  SECONDARY DIAGNOSIS:   Past Medical History  Diagnosis Date  . COPD (chronic obstructive pulmonary disease)   . ESRD on hemodialysis   . DM2 (diabetes mellitus, type 2)   . Hepatitis C   . Migraine with aura   . HOH (hard of hearing)     HOSPITAL COURSE:   59 y.o. female with K/H/O CAD per Care Everywhere (no prior caths), ESRD on HD, HTN, COPD, DM2, hepatitis C, and migraine disorder who presented to [facility] from her dialysis center this morning with complaints of 2 weeks of nausea, fatigue, SOB, and chest pain. She was found to have an elevated troponin of 0.28.  * Nausea, vomiting and diarrhea:  -Consult from gastroenterology noted -no more symptoms -pt declines EGD. Will get to f/u outpt with GI -tolerating po diet  * Elevated troponin: Of uncertain etiology at this time.  -consult from cardiology noted -ECHo looks ok EF 60% -pt has been cleared from cardiology for procedure. No further testing at present. Consider out pt stress if needed  * ESRD on HD:  Did not get HD y'day  given below dry weight -she will resume HD from [facility] -K 3.9. D/w dr Holley Raring  * Hypokalemia: K+ 3.1, will replete and recheck  * HTN: Resume home medications - Norvasc 5 mg and Imdur 30 mg -Will add Lopressor 12.5 mg bid with hold parameters per cardiology  * COPD: resume home inhalers -sats ok -not in resp distress  * DM2: Sliding scale insulin at this time Pt agreeable to go home. Spoke  with Dter Kelsey Peterson   DISCHARGE CONDITIONS:   fair CONSULTS OBTAINED:  Treatment Team:  Wellington Hampshire, MD Lucilla Lame, MD Minna Merritts, MD Munsoor Holley Raring, MD  DRUG ALLERGIES:   Allergies  Allergen Reactions  . Morphine And Related Hives  . Other Rash    Blood Pressure Pill (Pt doesn't remember name)     DISCHARGE MEDICATIONS:   Current Discharge Medication List    CONTINUE these medications which have NOT CHANGED   Details  acetaminophen (TYLENOL) 325 MG tablet Take 650 mg by mouth every 6 (six) hours as needed for moderate pain or fever.    albuterol (PROVENTIL HFA;VENTOLIN HFA) 108 (90 BASE) MCG/ACT inhaler Inhale 2 puffs into the lungs every 4 (four) hours as needed for wheezing or shortness of breath.     amLODipine (NORVASC) 5 MG tablet Take 5 mg by mouth daily.    Calcium Carbonate 500 MG CHEW Chew 1 tablet by mouth 2 (two) times daily. Take 2 hours after meals.    cinacalcet (SENSIPAR) 60 MG tablet Take 60 mg by mouth daily.    esomeprazole (NEXIUM) 40 MG capsule Take 40 mg by mouth daily.     ferrous sulfate 325 (65 FE) MG tablet Take 325 mg by mouth daily.    furosemide (LASIX) 80 MG tablet Take 80 mg by mouth daily. Patient takes on non dialysis days.    isosorbide mononitrate (IMDUR) 30 MG 24  hr tablet Take 30 mg by mouth daily.    Lidocaine-Prilocaine, Bulk, 1.4-9.7 % CREA 1 application by Other route as needed (for tx). Apply to site 20-45 minutes before tx.    sevelamer carbonate (RENVELA) 800 MG tablet Take 800 mg by mouth 4 (four) times daily. Takes 3 times daily with meal and 1 time with a snack.    traMADol (ULTRAM) 50 MG tablet Take 50 mg by mouth 2 (two) times daily as needed for moderate pain.         DISCHARGE INSTRUCTIONS:   Resume HD at d/c  If you experience worsening of your admission symptoms, develop shortness of breath, life threatening emergency, suicidal or homicidal thoughts you must seek medical attention  immediately by calling 911 or calling your MD immediately  if symptoms less severe.  You Must read complete instructions/literature along with all the possible adverse reactions/side effects for all the Medicines you take and that have been prescribed to you. Take any new Medicines after you have completely understood and accept all the possible adverse reactions/side effects.   Please note  You were cared for by a hospitalist during your hospital stay. If you have any questions about your discharge medications or the care you received while you were in the hospital after you are discharged, you can call the unit and asked to speak with the hospitalist on call if the hospitalist that took care of you is not available. Once you are discharged, your primary care physician will handle any further medical issues. Please note that NO REFILLS for any discharge medications will be authorized once you are discharged, as it is imperative that you return to your primary care physician (or establish a relationship with a primary care physician if you do not have one) for your aftercare needs so that they can reassess your need for medications and monitor your lab values. Today   SUBJECTIVE   Doing well. No Nausea,vomiting or diarrhea  VITAL SIGNS:  Blood pressure 152/96, pulse 69, temperature 98.2 F (36.8 C), temperature source Oral, resp. rate 20, height 5\' 2"  (1.575 m), weight 90.629 kg (199 lb 12.8 oz), SpO2 95 %.  I/O:   Intake/Output Summary (Last 24 hours) at 06/22/14 0939 Last data filed at 06/22/14 0605  Gross per 24 hour  Intake      3 ml  Output     25 ml  Net    -22 ml    PHYSICAL EXAMINATION:  GENERAL:  59 y.o.-year-old patient lying in the bed with no acute distress.  EYES: Pupils equal, round, reactive to light and accommodation. No scleral icterus. Extraocular muscles intact.  HEENT: Head atraumatic, normocephalic. Oropharynx and nasopharynx clear. Hard on hearing NECK:  Supple,  no jugular venous distention. No thyroid enlargement, no tenderness. HD cath + LUNGS: Normal breath sounds bilaterally, no wheezing, rales,rhonchi or crepitation. No use of accessory muscles of respiration.  CARDIOVASCULAR: S1, S2 normal. No murmurs, rubs, or gallops.  ABDOMEN: Soft, non-tender, non-distended. Bowel sounds present. No organomegaly or mass.  EXTREMITIES: No pedal edema, cyanosis, or clubbing.  NEUROLOGIC: Cranial nerves II through XII are intact. Muscle strength 5/5 in all extremities. Sensation intact. Gait not checked.  PSYCHIATRIC: The patient is alert and oriented x 3.  SKIN: No obvious rash, lesion, or ulcer.   DATA REVIEW:   CBC   Recent Labs Lab 06/22/14 0434  WBC 11.0  HGB 7.1*  HCT 22.1*  PLT 298    Chemistries   Recent Labs  Lab 06/21/14 0910 06/22/14 0434  NA 138 138  K 3.1* 3.9  CL 96* 98*  CO2 29 28  GLUCOSE 87 142*  BUN 21* 28*  CREATININE 4.73* 5.97*  CALCIUM 7.8* 7.6*  AST 37  --   ALT 28  --   ALKPHOS 101  --   BILITOT 1.5*  --     Microbiology Results   No results found for this or any previous visit (from the past 240 hour(s)).  RADIOLOGY:  Dg Chest Portable 1 View  06/21/2014   CLINICAL DATA:  59 year old female with a history of renal failure. Presents to the ED with nausea vomiting and diarrhea for 2 weeks. Right-sided chest pain.  EXAM: PORTABLE CHEST - 1 VIEW  COMPARISON:  02/23/2014  FINDINGS: Cardiomediastinal silhouette persistently enlarged.  Fullness in the hilar vessels.  Persisting mixed interstitial and airspace disease at of the right base. Left base not well evaluated.  Interval placement of left IJ approach split tip hemodialysis catheter, appearing to terminate in the superior atrium.  Unremarkable skeletal structures.  IMPRESSION: Interstitial and airspace disease at the right base, potentially edema or infection. Left base not well evaluated.  Cardiomegaly.  Interval placement of split tip hemodialysis catheter from  right IJ approach appearing to terminate in the superior atrium.  Signed,  Dulcy Fanny. Earleen Newport, DO  [facility]  [facility]   Electronically Signed   By: Corrie Mckusick D.O.   On: 06/21/2014 10:54     Management plans discussed with the patient, family and they are in agreement.  CODE STATUS:     Code Status Orders        Start     Ordered   06/21/14 1336  Full code   Continuous     06/21/14 1335     TOTAL TIME TAKING CARE OF THIS PATIENT: 40 minutes.   Ravon Mcilhenny M.D on 06/22/2014 at 9:39 AM  Between 7am to 6pm - Pager - [phone] After 6pm go to [email or web address] - password [email or web address] [facility]  [facility]  Office  [phone]  CC: Primary care physician; No primary care provider on file.

## 2014-06-26 ENCOUNTER — Ambulatory Visit: Payer: Self-pay | Admitting: Urgent Care

## 2014-07-03 ENCOUNTER — Inpatient Hospital Stay
Admission: EM | Admit: 2014-07-03 | Discharge: 2014-07-10 | DRG: 871 | Disposition: A | Discharge status: Home / Self Care | Payer: Medicare Other | Attending: Internal Medicine | Admitting: Internal Medicine

## 2014-07-03 ENCOUNTER — Emergency Department: Payer: Medicare Other

## 2014-07-03 ENCOUNTER — Encounter: Payer: Self-pay | Admitting: Intensive Care

## 2014-07-03 DIAGNOSIS — Z992 Dependence on renal dialysis: Secondary | ICD-10-CM | POA: Diagnosis not present

## 2014-07-03 DIAGNOSIS — B1920 Unspecified viral hepatitis C without hepatic coma: Secondary | ICD-10-CM | POA: Diagnosis present

## 2014-07-03 DIAGNOSIS — I4729 Other ventricular tachycardia: Secondary | ICD-10-CM | POA: Insufficient documentation

## 2014-07-03 DIAGNOSIS — J449 Chronic obstructive pulmonary disease, unspecified: Secondary | ICD-10-CM | POA: Diagnosis present

## 2014-07-03 DIAGNOSIS — H9190 Unspecified hearing loss, unspecified ear: Secondary | ICD-10-CM | POA: Diagnosis present

## 2014-07-03 DIAGNOSIS — J810 Acute pulmonary edema: Secondary | ICD-10-CM | POA: Diagnosis present

## 2014-07-03 DIAGNOSIS — B9689 Other specified bacterial agents as the cause of diseases classified elsewhere: Secondary | ICD-10-CM | POA: Diagnosis present

## 2014-07-03 DIAGNOSIS — E8779 Other fluid overload: Secondary | ICD-10-CM

## 2014-07-03 DIAGNOSIS — Z79899 Other long term (current) drug therapy: Secondary | ICD-10-CM

## 2014-07-03 DIAGNOSIS — E8770 Fluid overload, unspecified: Secondary | ICD-10-CM | POA: Diagnosis present

## 2014-07-03 DIAGNOSIS — I340 Nonrheumatic mitral (valve) insufficiency: Secondary | ICD-10-CM | POA: Diagnosis present

## 2014-07-03 DIAGNOSIS — A4189 Other specified sepsis: Principal | ICD-10-CM | POA: Diagnosis present

## 2014-07-03 DIAGNOSIS — E119 Type 2 diabetes mellitus without complications: Secondary | ICD-10-CM | POA: Diagnosis present

## 2014-07-03 DIAGNOSIS — D122 Benign neoplasm of ascending colon: Secondary | ICD-10-CM | POA: Diagnosis present

## 2014-07-03 DIAGNOSIS — D500 Iron deficiency anemia secondary to blood loss (chronic): Secondary | ICD-10-CM | POA: Diagnosis present

## 2014-07-03 DIAGNOSIS — D120 Benign neoplasm of cecum: Secondary | ICD-10-CM | POA: Diagnosis present

## 2014-07-03 DIAGNOSIS — I248 Other forms of acute ischemic heart disease: Secondary | ICD-10-CM | POA: Diagnosis present

## 2014-07-03 DIAGNOSIS — N186 End stage renal disease: Secondary | ICD-10-CM | POA: Diagnosis present

## 2014-07-03 DIAGNOSIS — I471 Supraventricular tachycardia: Secondary | ICD-10-CM | POA: Diagnosis present

## 2014-07-03 DIAGNOSIS — D631 Anemia in chronic kidney disease: Secondary | ICD-10-CM | POA: Diagnosis present

## 2014-07-03 DIAGNOSIS — I2510 Atherosclerotic heart disease of native coronary artery without angina pectoris: Secondary | ICD-10-CM | POA: Diagnosis present

## 2014-07-03 DIAGNOSIS — I120 Hypertensive chronic kidney disease with stage 5 chronic kidney disease or end stage renal disease: Secondary | ICD-10-CM | POA: Diagnosis present

## 2014-07-03 DIAGNOSIS — I472 Ventricular tachycardia: Secondary | ICD-10-CM | POA: Diagnosis present

## 2014-07-03 DIAGNOSIS — N2581 Secondary hyperparathyroidism of renal origin: Secondary | ICD-10-CM | POA: Diagnosis present

## 2014-07-03 DIAGNOSIS — K5730 Diverticulosis of large intestine without perforation or abscess without bleeding: Secondary | ICD-10-CM | POA: Diagnosis present

## 2014-07-03 DIAGNOSIS — D649 Anemia, unspecified: Secondary | ICD-10-CM | POA: Diagnosis not present

## 2014-07-03 DIAGNOSIS — J811 Chronic pulmonary edema: Secondary | ICD-10-CM | POA: Diagnosis present

## 2014-07-03 DIAGNOSIS — R509 Fever, unspecified: Secondary | ICD-10-CM

## 2014-07-03 HISTORY — DX: Other symptoms and signs involving cognitive functions and awareness: R41.89

## 2014-07-03 HISTORY — DX: Nonrheumatic mitral (valve) insufficiency: I34.0

## 2014-07-03 LAB — CBC WITH DIFFERENTIAL/PLATELET
BASOS ABS: 0 10*3/uL (ref 0–0.1)
Basophils Relative: 0 %
Eosinophils Absolute: 0.1 10*3/uL (ref 0–0.7)
Eosinophils Relative: 1 %
HEMATOCRIT: 21.9 % — AB (ref 35.0–47.0)
HEMOGLOBIN: 7.1 g/dL — AB (ref 12.0–16.0)
LYMPHS ABS: 0.8 10*3/uL — AB (ref 1.0–3.6)
LYMPHS PCT: 7 %
MCH: 24.9 pg — ABNORMAL LOW (ref 26.0–34.0)
MCHC: 32.2 g/dL (ref 32.0–36.0)
MCV: 77.3 fL — ABNORMAL LOW (ref 80.0–100.0)
Monocytes Absolute: 0.6 10*3/uL (ref 0.2–0.9)
Monocytes Relative: 6 %
NEUTROS ABS: 9.4 10*3/uL — AB (ref 1.4–6.5)
NEUTROS PCT: 86 %
Platelets: 302 10*3/uL (ref 150–440)
RBC: 2.83 MIL/uL — AB (ref 3.80–5.20)
RDW: 21.8 % — ABNORMAL HIGH (ref 11.5–14.5)
WBC: 10.9 10*3/uL (ref 3.6–11.0)

## 2014-07-03 LAB — HEPATIC FUNCTION PANEL
ALT: 20 U/L (ref 14–54)
AST: 32 U/L (ref 15–41)
Albumin: 3.1 g/dL — ABNORMAL LOW (ref 3.5–5.0)
Alkaline Phosphatase: 80 U/L (ref 38–126)
BILIRUBIN DIRECT: 0.4 mg/dL (ref 0.1–0.5)
BILIRUBIN INDIRECT: 1.2 mg/dL — AB (ref 0.3–0.9)
BILIRUBIN TOTAL: 1.6 mg/dL — AB (ref 0.3–1.2)
Total Protein: 6.6 g/dL (ref 6.5–8.1)

## 2014-07-03 LAB — LIPASE, BLOOD: Lipase: 35 U/L (ref 22–51)

## 2014-07-03 LAB — BASIC METABOLIC PANEL
Anion gap: 12 (ref 5–15)
BUN: 27 mg/dL — ABNORMAL HIGH (ref 6–20)
CO2: 29 mmol/L (ref 22–32)
Calcium: 7.5 mg/dL — ABNORMAL LOW (ref 8.9–10.3)
Chloride: 96 mmol/L — ABNORMAL LOW (ref 101–111)
Creatinine, Ser: 6.57 mg/dL — ABNORMAL HIGH (ref 0.44–1.00)
GFR calc Af Amer: 7 mL/min — ABNORMAL LOW (ref >60)
GFR, EST NON AFRICAN AMERICAN: 6 mL/min — AB (ref >60)
GLUCOSE: 96 mg/dL (ref 65–99)
Potassium: 3.4 mmol/L — ABNORMAL LOW (ref 3.5–5.1)
SODIUM: 137 mmol/L (ref 135–145)

## 2014-07-03 LAB — TROPONIN I
TROPONIN I: 0.03 ng/mL (ref <0.031)
Troponin I: 0.15 ng/mL — ABNORMAL HIGH (ref <0.031)

## 2014-07-03 LAB — MAGNESIUM: Magnesium: 1.8 mg/dL (ref 1.7–2.4)

## 2014-07-03 MED ORDER — ISOSORBIDE MONONITRATE ER 30 MG PO TB24
30.0000 mg | ORAL_TABLET | Freq: Every day | ORAL | Status: DC
  Administered 2014-07-03 – 2014-07-08 (×5): 30 mg via ORAL
  Filled 2014-07-03 (×5): qty 1

## 2014-07-03 MED ORDER — NITROGLYCERIN 0.4 MG SL SUBL
0.4000 mg | SUBLINGUAL_TABLET | Freq: Once | SUBLINGUAL | Status: AC
  Administered 2014-07-03: 0.4 mg via SUBLINGUAL

## 2014-07-03 MED ORDER — DILTIAZEM HCL 25 MG/5ML IV SOLN
20.0000 mg | Freq: Once | INTRAVENOUS | Status: AC
  Administered 2014-07-03: 20 mg via INTRAVENOUS

## 2014-07-03 MED ORDER — SODIUM CHLORIDE 0.9 % IJ SOLN
3.0000 mL | INTRAMUSCULAR | Status: DC | PRN
  Administered 2014-07-04: 3 mL via INTRAVENOUS
  Filled 2014-07-03: qty 10

## 2014-07-03 MED ORDER — AMLODIPINE BESYLATE 5 MG PO TABS
5.0000 mg | ORAL_TABLET | Freq: Every day | ORAL | Status: DC
  Administered 2014-07-03 – 2014-07-08 (×5): 5 mg via ORAL
  Filled 2014-07-03 (×5): qty 1

## 2014-07-03 MED ORDER — CALCIUM CARBONATE ANTACID 500 MG PO CHEW
1.0000 | CHEWABLE_TABLET | Freq: Every day | ORAL | Status: DC
  Administered 2014-07-03 – 2014-07-05 (×3): 200 mg via ORAL
  Filled 2014-07-03 (×4): qty 1

## 2014-07-03 MED ORDER — ACETAMINOPHEN 650 MG RE SUPP: 650.0000 mg | Freq: Four times a day (QID) | RECTAL | Status: DC | PRN

## 2014-07-03 MED ORDER — SODIUM CHLORIDE 0.9 % IV SOLN: 250.0000 mL | INTRAVENOUS | Status: DC | PRN

## 2014-07-03 MED ORDER — IPRATROPIUM-ALBUTEROL 0.5-2.5 (3) MG/3ML IN SOLN
RESPIRATORY_TRACT | Status: AC
  Administered 2014-07-03: 3 mL via RESPIRATORY_TRACT
  Filled 2014-07-03: qty 3

## 2014-07-03 MED ORDER — PANTOPRAZOLE SODIUM 40 MG PO TBEC
40.0000 mg | DELAYED_RELEASE_TABLET | Freq: Every day | ORAL | Status: DC
  Administered 2014-07-03 – 2014-07-08 (×5): 40 mg via ORAL
  Filled 2014-07-03 (×5): qty 1

## 2014-07-03 MED ORDER — CINACALCET HCL 30 MG PO TABS
60.0000 mg | ORAL_TABLET | Freq: Every day | ORAL | Status: DC
  Administered 2014-07-04 – 2014-07-08 (×4): 60 mg via ORAL
  Filled 2014-07-03 (×6): qty 2

## 2014-07-03 MED ORDER — FERROUS SULFATE 325 (65 FE) MG PO TABS
325.0000 mg | ORAL_TABLET | Freq: Every day | ORAL | Status: DC
  Administered 2014-07-03 – 2014-07-08 (×5): 325 mg via ORAL
  Filled 2014-07-03 (×5): qty 1

## 2014-07-03 MED ORDER — SEVELAMER CARBONATE 800 MG PO TABS
800.0000 mg | ORAL_TABLET | Freq: Four times a day (QID) | ORAL | Status: DC
  Administered 2014-07-03 – 2014-07-09 (×17): 800 mg via ORAL
  Filled 2014-07-03 (×21): qty 1

## 2014-07-03 MED ORDER — FUROSEMIDE 10 MG/ML IJ SOLN
INTRAMUSCULAR | Status: AC
  Filled 2014-07-03: qty 10

## 2014-07-03 MED ORDER — ONDANSETRON HCL 4 MG PO TABS
4.0000 mg | ORAL_TABLET | Freq: Four times a day (QID) | ORAL | Status: DC | PRN
  Administered 2014-07-08: 4 mg via ORAL
  Filled 2014-07-03: qty 1

## 2014-07-03 MED ORDER — NEPHRO-VITE 0.8 MG PO TABS
1.0000 | ORAL_TABLET | Freq: Every day | ORAL | Status: DC
  Administered 2014-07-03 – 2014-07-04 (×2): 1 via ORAL
  Administered 2014-07-05: 15:00:00 via ORAL
  Administered 2014-07-06: 1 via ORAL
  Filled 2014-07-03 (×8): qty 1

## 2014-07-03 MED ORDER — FUROSEMIDE 10 MG/ML IJ SOLN
80.0000 mg | Freq: Once | INTRAMUSCULAR | Status: AC
  Administered 2014-07-03: 80 mg via INTRAVENOUS

## 2014-07-03 MED ORDER — DILTIAZEM HCL 25 MG/5ML IV SOLN
INTRAVENOUS | Status: AC
  Administered 2014-07-03: 20 mg via INTRAVENOUS
  Filled 2014-07-03: qty 5

## 2014-07-03 MED ORDER — ACETAMINOPHEN 325 MG PO TABS
650.0000 mg | ORAL_TABLET | Freq: Four times a day (QID) | ORAL | Status: DC | PRN
  Administered 2014-07-05: 650 mg via ORAL
  Filled 2014-07-03: qty 2

## 2014-07-03 MED ORDER — PREDNISONE 20 MG PO TABS
60.0000 mg | ORAL_TABLET | Freq: Once | ORAL | Status: AC
  Administered 2014-07-03: 60 mg via ORAL

## 2014-07-03 MED ORDER — FUROSEMIDE 40 MG PO TABS
80.0000 mg | ORAL_TABLET | Freq: Every day | ORAL | Status: DC
  Administered 2014-07-04 – 2014-07-08 (×4): 80 mg via ORAL
  Filled 2014-07-03 (×5): qty 2

## 2014-07-03 MED ORDER — ALBUTEROL SULFATE (2.5 MG/3ML) 0.083% IN NEBU: 2.5000 mg | INHALATION_SOLUTION | RESPIRATORY_TRACT | Status: DC | PRN

## 2014-07-03 MED ORDER — IPRATROPIUM-ALBUTEROL 0.5-2.5 (3) MG/3ML IN SOLN
3.0000 mL | RESPIRATORY_TRACT | Status: AC
  Administered 2014-07-03 (×2): 3 mL via RESPIRATORY_TRACT

## 2014-07-03 MED ORDER — NITROGLYCERIN 0.4 MG SL SUBL
SUBLINGUAL_TABLET | SUBLINGUAL | Status: AC
  Filled 2014-07-03: qty 1

## 2014-07-03 MED ORDER — HEPARIN SODIUM (PORCINE) 5000 UNIT/ML IJ SOLN
5000.0000 [IU] | Freq: Three times a day (TID) | INTRAMUSCULAR | Status: DC
Start: 2014-07-03 — End: 2014-07-04
  Administered 2014-07-03 – 2014-07-04 (×4): 5000 [IU] via SUBCUTANEOUS
  Filled 2014-07-03 (×4): qty 1

## 2014-07-03 MED ORDER — IPRATROPIUM-ALBUTEROL 0.5-2.5 (3) MG/3ML IN SOLN
RESPIRATORY_TRACT | Status: AC
  Administered 2014-07-03: 3 mL
  Filled 2014-07-03: qty 3

## 2014-07-03 MED ORDER — SODIUM CHLORIDE 0.9 % IJ SOLN
3.0000 mL | Freq: Two times a day (BID) | INTRAMUSCULAR | Status: DC
  Administered 2014-07-03 – 2014-07-08 (×10): 3 mL via INTRAVENOUS

## 2014-07-03 MED ORDER — PREDNISONE 20 MG PO TABS
ORAL_TABLET | ORAL | Status: AC
  Administered 2014-07-03: 60 mg via ORAL
  Filled 2014-07-03: qty 3

## 2014-07-03 MED ORDER — CLOPIDOGREL BISULFATE 75 MG PO TABS
75.0000 mg | ORAL_TABLET | Freq: Every day | ORAL | Status: DC
  Administered 2014-07-03 – 2014-07-08 (×5): 75 mg via ORAL
  Filled 2014-07-03 (×5): qty 1

## 2014-07-03 MED ORDER — ONDANSETRON HCL 4 MG/2ML IJ SOLN
4.0000 mg | Freq: Four times a day (QID) | INTRAMUSCULAR | Status: DC | PRN
  Filled 2014-07-03: qty 2

## 2014-07-03 NOTE — ED Notes (Signed)
Pt arrived by [facility] from Dialysis on [address]. EMS was called per pt c/o flank pain. HR of 118 that steadily climbed to 141 while at dialysis. Treatment last 45 min. Pt has difficulty hearing. Per EMS SVT on 12 lead, unremarkable. C port on Right side chest. Per EMS 156/110 B/P, 95 RA

## 2014-07-03 NOTE — ED Notes (Signed)
Dr Joni Fears notified of troponin per lab.

## 2014-07-03 NOTE — Care Management Note (Addendum)
Case Management Note  Patient Details  Name: Kelsey Peterson MRN: [id number] Date of Birth: 02/03/1955  Subjective/Objective:   SPoke to Dr Bridgett Larsson about pt being OBSV. He states he expects pt . To have HD today, and d/c tomorrow.                 Action/Plan:   Expected Discharge Date:                  Expected Discharge Plan:     In-House Referral:     Discharge planning Services     Post Acute Care Choice:    Choice offered to:     DME Arranged:    DME Agency:     HH Arranged:    [place] Agency:     Status of Service:     Medicare Important Message Given:    Date Medicare IM Given:    Medicare IM give by:    Date Additional Medicare IM Given:    Additional Medicare Important Message give by:     If discussed at [place] of Stay Meetings, dates discussed:    Additional Comments:  Beau Fanny, RN 07/03/2014, 1:32 PM Pt. Now IP  Since she has had hypoxia  To 87%. MD previously unaware of same. Pt . Now IP status

## 2014-07-03 NOTE — ED Notes (Signed)
MD Chen at bedside.

## 2014-07-03 NOTE — ED Notes (Signed)
Patient transported to CT 

## 2014-07-03 NOTE — H&P (Addendum)
[place] at [place] NAME: Kelsey Peterson    MR#:  [id number]  DATE OF BIRTH:  04/07/1955  DATE OF ADMISSION:  07/03/2014  PRIMARY CARE PHYSICIAN: No primary care provider on file.   REQUESTING/REFERRING PHYSICIAN: Carrie Mew, MD  CHIEF COMPLAINT:   Chief Complaint  Patient presents with  . Flank Pain  SVT today.  HISTORY OF PRESENT ILLNESS:  Kelsey Peterson  is a 59 y.o. female with a known history of   COPD (chronic obstructive pulmonary disease)   . ESRD on hemodialysis   . DM2 (diabetes mellitus, type 2)   . Hepatitis C   . Migraine with aura   . HOH (hard of hearing)        The patient was sent to the ED from dialysis center due to tachycardia today. The patient is alert, awake but has a hard hearing and limited communication. According to ED physician, the patient was noticed to have a tachycardia during dialysis and was sent to ED for further evaluation. She was noticed to have SVT at 140s, was treated with a Cardizem and heart rate decreased to normal range. But the patient complains of shortness of breath and O2 sat decreased to 87% without oxygen. Chest x-ray show pulmonary edema. She was treated with the oxygen, nitroglycerin and Lasix in ED. The patient complains of bilateral lower extremity edema on and off for a few weeks. But she denies any other symptoms.  PAST MEDICAL HISTORY:   Past Medical History  Diagnosis Date  . COPD (chronic obstructive pulmonary disease)   . ESRD on hemodialysis   . DM2 (diabetes mellitus, type 2)   . Hepatitis C   . Migraine with aura   . HOH (hard of hearing)     PAST SURGICAL HISTORY:   Past Surgical History  Procedure Laterality Date  . Dialysis fistula creation    . Tubal ligation      SOCIAL HISTORY:   History  Substance Use Topics  . Smoking status: Never Smoker   . Smokeless tobacco: Never Used  . Alcohol Use: No    FAMILY HISTORY:    Family History  Problem Relation Age of Onset  . Heart disease Mother   . Hypertension Mother     DRUG ALLERGIES:   Allergies  Allergen Reactions  . Morphine And Related Hives  . Other Rash    Blood Pressure Pill (Pt doesn't remember name)     REVIEW OF SYSTEMS:  CONSTITUTIONAL: No fever, but has generalized weakness.  EYES: No blurred or double vision.  EARS, NOSE, AND THROAT: No tinnitus or ear pain. Heart hearing. RESPIRATORY: Positive cough and shortness of breath, no wheezing or hemoptysis.  CARDIOVASCULAR: No chest pain, orthopnea, positive for leg edema.  GASTROINTESTINAL: No nausea, vomiting, diarrhea or abdominal pain.  GENITOURINARY: No dysuria, hematuria.  ENDOCRINE: No polyuria, nocturia,  HEMATOLOGY: No easy bruising or bleeding SKIN: No rash or lesion. MUSCULOSKELETAL: No joint pain or arthritis.   NEUROLOGIC: No tingling, numbness, weakness.  PSYCHIATRY: No anxiety or depression.   MEDICATIONS AT HOME:   Prior to Admission medications   Medication Sig Start Date End Date Taking? Authorizing Provider  acetaminophen (TYLENOL) 325 MG tablet Take 650 mg by mouth every 6 (six) hours as needed for moderate pain or fever.    Historical Provider, MD  albuterol (PROVENTIL HFA;VENTOLIN HFA) 108 (90 BASE) MCG/ACT inhaler Inhale 2 puffs into the lungs every 4 (four) hours as needed  for wheezing or shortness of breath.     Historical Provider, MD  amLODipine (NORVASC) 5 MG tablet Take 5 mg by mouth daily.    Historical Provider, MD  Calcium Carbonate 500 MG CHEW Chew 1 tablet by mouth 2 (two) times daily. Take 2 hours after meals.    Historical Provider, MD  cinacalcet (SENSIPAR) 60 MG tablet Take 60 mg by mouth daily.    Historical Provider, MD  esomeprazole (NEXIUM) 40 MG capsule Take 40 mg by mouth daily.     Historical Provider, MD  ferrous sulfate 325 (65 FE) MG tablet Take 325 mg by mouth daily.    Historical Provider, MD  furosemide (LASIX) 80 MG tablet Take 80  mg by mouth daily. Patient takes on non dialysis days.    Historical Provider, MD  isosorbide mononitrate (IMDUR) 30 MG 24 hr tablet Take 30 mg by mouth daily.    Historical Provider, MD  Lidocaine-Prilocaine, Bulk, 6.9-6.7 % CREA 1 application by Other route as needed (for tx). Apply to site 20-45 minutes before tx.    Historical Provider, MD  sevelamer carbonate (RENVELA) 800 MG tablet Take 800 mg by mouth 4 (four) times daily. Takes 3 times daily with meal and 1 time with a snack.    Historical Provider, MD  traMADol (ULTRAM) 50 MG tablet Take 50 mg by mouth 2 (two) times daily as needed for moderate pain.    Historical Provider, MD      VITAL SIGNS:  Blood pressure 151/83, pulse 82, temperature 97.6 F (36.4 C), temperature source Oral, resp. rate 30, weight 92.987 kg (205 lb), SpO2 100 %.  PHYSICAL EXAMINATION:  GENERAL:  59 y.o.-year-old patient lying in the bed with no acute distress.  EYES: Pupils equal, round, reactive to light and accommodation. No scleral icterus. Extraocular muscles intact.  HEENT: Head atraumatic, normocephalic. Oropharynx and nasopharynx clear.  NECK:  Supple, no jugular venous distention. No thyroid enlargement, no tenderness.  LUNGS: Normal breath sounds bilaterally, no wheezing, bilateral basilar rales, rhonchi or crepitation. No use of accessory muscles of respiration.  CARDIOVASCULAR: S1, S2 normal. No murmurs, rubs, or gallops.  ABDOMEN: Soft, nontender, nondistended. Bowel sounds present. No organomegaly or mass.  EXTREMITIES: Bilateral leg edema 2+, cyanosis, or clubbing.  NEUROLOGIC: Cranial nerves II through XII are intact. Muscle strength 5/5 in all extremities. Sensation intact. Gait not checked.  PSYCHIATRIC: The patient is alert and oriented x 3.  SKIN: No obvious rash, lesion, or ulcer.   LABORATORY PANEL:   CBC  Recent Labs Lab 07/03/14 0934  WBC 10.9  HGB 7.1*  HCT 21.9*  PLT 302    ------------------------------------------------------------------------------------------------------------------  Chemistries   Recent Labs Lab 07/03/14 0933 07/03/14 0934  NA  --  137  K  --  3.4*  CL  --  96*  CO2  --  29  GLUCOSE  --  96  BUN  --  27*  CREATININE  --  6.57*  CALCIUM  --  7.5*  MG 1.8  --   AST  --  32  ALT  --  20  ALKPHOS  --  80  BILITOT  --  1.6*   ------------------------------------------------------------------------------------------------------------------  Cardiac Enzymes  Recent Labs Lab 07/03/14 0933  TROPONINI 0.15*   ------------------------------------------------------------------------------------------------------------------  RADIOLOGY:  Dg Chest 1 View  07/03/2014   CLINICAL DATA:  Flank pain today.  Patient on dialysis.  EXAM: CHEST  1 VIEW  COMPARISON:  Single view of the chest 06/21/2014. PA and lateral  chest 09/23/2013 and CT chest 0 9/ 06/2013.  FINDINGS: Right IJ approach dialysis catheter is again seen. There is marked enlargement of the cardiopericardial silhouette. Small pleural effusions are identified, greater on the left. There is interstitial pulmonary edema. Basilar airspace disease is likely due to atelectasis.  IMPRESSION: Interstitial pulmonary edema with small bilateral pleural effusions, larger on the left.  Marked enlargement of the cardiopericardial silhouette compatible with cardiomegaly and/or pericardial effusion.   Electronically Signed   By: Inge Rise M.D.   On: 07/03/2014 08:16    EKG:   Orders placed or performed during the hospital encounter of 06/21/14  . EKG  . EKG 12-Lead  . EKG 12-Lead    IMPRESSION AND PLAN:  SVT Pulmonary edema with hypoxia Fluid overload Eleviated troponin, possible due to demanding ischemia. ESRD on hemodialysis Hypertension   The patient will be admitted to telemetry floor and continued telemetry monitor. Pulmonary edema with hypoxia, the patient did need  a urgent hemodialysis. I will request nephrology consult for the dialysis today. For anemia, which is due to chronic disease, I will request a nephrology consult for possible Epogen during dialysis and a follow-up hemoglobin level. For elevated troponin, I will continue Plavix and a follow-up troponin level. Continue other home medication.     All the records are reviewed and case discussed with ED provider. Management plans discussed with the patient, family and they are in agreement.  CODE STATUS: FULL CODE TOTAL TIME TAKING CARE OF THIS PATIENT: 52  minutes.    Demetrios Loll M.D on 07/03/2014 at 1:31 PM  Between 7am to 6pm - Pager - [phone]  After 6pm go to [email or web address] - password [email or web address] [facility]  [place] [facility]  Office  [phone]  CC: Primary care physician; No primary care provider on file.

## 2014-07-03 NOTE — ED Provider Notes (Signed)
[facility] Emergency Department Provider Note  ____________________________________________  Time seen: 7:50 AM on arrival by EMS  I have reviewed the triage vital signs and the nursing notes.   HISTORY  Chief Complaint Flank Pain    HPI TANASIA BUDZINSKI is a 59 y.o. female who complained of left flank pain at dialysis this morning. She was noted to be in SVT and was transferred to the ED by EMS. This is reported to have happened about 1-2 weeks ago as well. She's been compliant with dialysis and otherwise in her usual state of health. Pain is sharp nonpleuritic and nonexertional. Left flank and left lower chest, associated with shortness of breath. No cough rhinorrhea diaphoresis vomiting.     Past Medical History  Diagnosis Date  . COPD (chronic obstructive pulmonary disease)   . ESRD on hemodialysis   . DM2 (diabetes mellitus, type 2)   . Hepatitis C   . Migraine with aura   . HOH (hard of hearing)     Patient Active Problem List   Diagnosis Date Noted  . Pain in the chest   . Diarrhea   . NSTEMI (non-ST elevated myocardial infarction) 06/21/2014    Past Surgical History  Procedure Laterality Date  . Dialysis fistula creation    . Tubal ligation      Current Outpatient Rx  Name  Route  Sig  Dispense  Refill  . acetaminophen (TYLENOL) 325 MG tablet   Oral   Take 650 mg by mouth every 6 (six) hours as needed for moderate pain or fever.         Marland Kitchen albuterol (PROVENTIL HFA;VENTOLIN HFA) 108 (90 BASE) MCG/ACT inhaler   Inhalation   Inhale 2 puffs into the lungs every 4 (four) hours as needed for wheezing or shortness of breath.          Marland Kitchen amLODipine (NORVASC) 5 MG tablet   Oral   Take 5 mg by mouth daily.         . Calcium Carbonate 500 MG CHEW   Oral   Chew 1 tablet by mouth 2 (two) times daily. Take 2 hours after meals.         . cinacalcet (SENSIPAR) 60 MG tablet   Oral   Take 60 mg by mouth daily.         Marland Kitchen  esomeprazole (NEXIUM) 40 MG capsule   Oral   Take 40 mg by mouth daily.          . ferrous sulfate 325 (65 FE) MG tablet   Oral   Take 325 mg by mouth daily.         . furosemide (LASIX) 80 MG tablet   Oral   Take 80 mg by mouth daily. Patient takes on non dialysis days.         . isosorbide mononitrate (IMDUR) 30 MG 24 hr tablet   Oral   Take 30 mg by mouth daily.         . Lidocaine-Prilocaine, Bulk, 2.5-2.5 % CREA   Other   1 application by Other route as needed (for tx). Apply to site 20-45 minutes before tx.         . sevelamer carbonate (RENVELA) 800 MG tablet   Oral   Take 800 mg by mouth 4 (four) times daily. Takes 3 times daily with meal and 1 time with a snack.         . traMADol (ULTRAM) 50 MG tablet  Oral   Take 50 mg by mouth 2 (two) times daily as needed for moderate pain.           Allergies Morphine and related and Other  Family History  Problem Relation Age of Onset  . Heart disease Mother   . Hypertension Mother     Social History History  Substance Use Topics  . Smoking status: Never Smoker   . Smokeless tobacco: Never Used  . Alcohol Use: No    Review of Systems  Constitutional: No fever or chills. No weight changes Eyes:No blurry vision or double vision.  ENT: No sore throat. Cardiovascular: Left lower chest pain. Respiratory: Positive shortness of breath. Gastrointestinal: Left flank pain.  No BRBPR or melena. Genitourinary: Chronic all ago urea due to end-stage renal disease, last urinated at 4:00 AM today. Denies dysuria frequency urgency or hematuria.  Musculoskeletal: Negative for back pain. No joint swelling or pain. Skin: Negative for rash. Neurological: Negative for headaches, focal weakness or numbness. Psychiatric:No anxiety or depression.   Endocrine:No hot/cold intolerance, changes in energy, or sleep difficulty.  10-point ROS otherwise  negative.  ____________________________________________   PHYSICAL EXAM:  VITAL SIGNS: ED Triage Vitals  Enc Vitals Group     BP 07/03/14 0800 173/101 mmHg     Pulse Rate 07/03/14 0800 140     Resp 07/03/14 0800 22     Temp 07/03/14 0800 97.6 F (36.4 C)     Temp Source 07/03/14 0800 Oral     SpO2 07/03/14 0800 99 %     Weight 07/03/14 0800 205 lb (92.987 kg)     Height --      Head Cir --      Peak Flow --      Pain Score 07/03/14 0801 7     Pain Loc --      Pain Edu? --      Excl. in [facility]? --   Room air saturation 88%, increased to 92-94% on 3 L nasal cannula   Constitutional: Alert and oriented. Moderate distress, hard of hearing Eyes: No scleral icterus. No conjunctival pallor. PERRL. EOMI ENT   Head: Normocephalic and atraumatic.   Nose: No congestion/rhinnorhea. No septal hematoma   Mouth/Throat: MMM, no pharyngeal erythema. No peritonsillar mass. No uvula shift.   Neck: No stridor. No SubQ emphysema. No meningismus. Positive JVD Hematological/Lymphatic/Immunilogical: No cervical lymphadenopathy. Cardiovascular: RRR. Normal and symmetric distal pulses are present in all extremities. No murmurs, rubs, or gallops. Respiratory: Poor air entry diffusely. Expiratory wheezes throughout. Normal expiratory phase.  Gastrointestinal: Soft and nontender. No distention. There is no CVA tenderness.  No rebound, rigidity, or guarding. Genitourinary: deferred Musculoskeletal: Nontender with normal range of motion in all extremities. No joint effusions.  No lower extremity tenderness.  No edema. Neurologic:   Normal speech and language.  CN 2-10 normal. Motor grossly intact. No pronator drift.  Normal gait. No gross focal neurologic deficits are appreciated.  Skin:  Skin is warm, dry and intact. No rash noted.  No petechiae, purpura, or bullae. Psychiatric: Mood and affect are normal. Speech and behavior are normal. Patient exhibits appropriate insight and  judgment.  ____________________________________________    LABS (pertinent positives/negatives) (all labs ordered are listed, but only abnormal results are displayed) Labs Reviewed  BASIC METABOLIC PANEL  LIPASE, BLOOD  HEPATIC FUNCTION PANEL  CBC WITH DIFFERENTIAL/PLATELET  URINALYSIS COMPLETEWITH MICROSCOPIC ([place])  TROPONIN I  MAGNESIUM   ____________________________________________   EKG  Interpreted by me Normal sinus rhythm  rate of 83, normal axis normal intervals and normal QRS, ST segments and T waves.  Repeat EKG at 9:32 AM, interpreted by me Supraventricular tachycardia heart rate 138. No ischemic changes   ____________________________________________    RADIOLOGY  Chest x-ray reveals pulmonary edema with small bilateral pleural effusions left greater than right. Cardiomegaly.  ____________________________________________   PROCEDURES CRITICAL CARE Performed by: Joni Fears, Jamarkus Lisbon   Total critical care time: 35 minutes  Critical care time was exclusive of separately billable procedures and treating other patients.  Critical care was necessary to treat or prevent imminent or life-threatening deterioration.  Critical care was time spent personally by me on the following activities: development of treatment plan with patient and/or surrogate as well as nursing, discussions with consultants, evaluation of patient's response to treatment, examination of patient, obtaining history from patient or surrogate, ordering and performing treatments and interventions, ordering and review of laboratory studies, ordering and review of radiographic studies, pulse oximetry and re-evaluation of patient's condition.  ____________________________________________   INITIAL IMPRESSION / ASSESSMENT AND PLAN / ED COURSE  Pertinent labs & imaging results that were available during my care of the patient were reviewed by me and considered in my medical decision making (see  chart for details).  Patient presents with acute hypoxic respiratory failure in the setting of dialysis. Possible healthcare associated pneumonia versus fluid overload versus COPD exacerbation with the wheezing. We'll give her steroids and nebs will checking a chest x-ray and labs.  ----------------------------------------- 9:39 AM on 07/03/2014 -----------------------------------------  Chest x-ray consistent with pulmonary edema and volume overload. Patient is again in SVT with a heart rate of 140. She's been given nitroglycerin sublingual and Lasix IV for the volume overload as her oxygen saturation is 87% on nasal cannula oxygen currently. We will also plan to give diltiazem IV for the SVT. We'll plan to admit for further management. Peripheral IV placed by me under ultrasound visualization in the left antecubital fossa.  ----------------------------------------- 1:03 PM on 07/03/2014 -----------------------------------------  Chemistry result obtained, no additional acute findings. Discussed with Dr. Bridgett Larsson for admission.  ____________________________________________   FINAL CLINICAL IMPRESSION(S) / ED DIAGNOSES  Final diagnoses:  Acute pulmonary edema  End stage renal disease on dialysis  Other hypervolemia  Supraventricular tachycardia      Carrie Mew, MD 07/03/14 1304

## 2014-07-04 ENCOUNTER — Encounter: Payer: Self-pay | Admitting: Physician Assistant

## 2014-07-04 DIAGNOSIS — R079 Chest pain, unspecified: Secondary | ICD-10-CM

## 2014-07-04 DIAGNOSIS — I4729 Other ventricular tachycardia: Secondary | ICD-10-CM | POA: Insufficient documentation

## 2014-07-04 DIAGNOSIS — I472 Ventricular tachycardia: Secondary | ICD-10-CM

## 2014-07-04 DIAGNOSIS — D649 Anemia, unspecified: Secondary | ICD-10-CM

## 2014-07-04 DIAGNOSIS — I471 Supraventricular tachycardia: Secondary | ICD-10-CM

## 2014-07-04 DIAGNOSIS — Z992 Dependence on renal dialysis: Secondary | ICD-10-CM

## 2014-07-04 DIAGNOSIS — N186 End stage renal disease: Secondary | ICD-10-CM | POA: Insufficient documentation

## 2014-07-04 LAB — PREPARE RBC (CROSSMATCH)

## 2014-07-04 LAB — URINALYSIS COMPLETE WITH MICROSCOPIC (ARMC ONLY)
BACTERIA UA: NONE SEEN
Bilirubin Urine: NEGATIVE
Glucose, UA: 50 mg/dL — AB
KETONES UR: NEGATIVE mg/dL
Leukocytes, UA: NEGATIVE
NITRITE: NEGATIVE
PROTEIN: 100 mg/dL — AB
Specific Gravity, Urine: 1.014 (ref 1.005–1.030)
pH: 9 — ABNORMAL HIGH (ref 5.0–8.0)

## 2014-07-04 LAB — CBC
HEMATOCRIT: 17.9 % — AB (ref 35.0–47.0)
Hemoglobin: 5.8 g/dL — ABNORMAL LOW (ref 12.0–16.0)
MCH: 24.8 pg — ABNORMAL LOW (ref 26.0–34.0)
MCHC: 32.5 g/dL (ref 32.0–36.0)
MCV: 76.5 fL — ABNORMAL LOW (ref 80.0–100.0)
PLATELETS: 245 10*3/uL (ref 150–440)
RBC: 2.34 MIL/uL — AB (ref 3.80–5.20)
RDW: 20.9 % — ABNORMAL HIGH (ref 11.5–14.5)
WBC: 8.2 10*3/uL (ref 3.6–11.0)

## 2014-07-04 LAB — HEMOGLOBIN: HEMOGLOBIN: 6.8 g/dL — AB (ref 12.0–16.0)

## 2014-07-04 LAB — ABO/RH: ABO/RH(D): O NEG

## 2014-07-04 MED ORDER — EPOETIN ALFA 10000 UNIT/ML IJ SOLN
10000.0000 [IU] | Freq: Once | INTRAMUSCULAR | Status: AC
  Administered 2014-07-05: 10000 [IU] via SUBCUTANEOUS

## 2014-07-04 MED ORDER — AMIODARONE HCL 200 MG PO TABS
400.0000 mg | ORAL_TABLET | Freq: Two times a day (BID) | ORAL | Status: DC
  Administered 2014-07-04 – 2014-07-08 (×8): 400 mg via ORAL
  Filled 2014-07-04 (×10): qty 2

## 2014-07-04 MED ORDER — HYDRALAZINE HCL 20 MG/ML IJ SOLN
10.0000 mg | INTRAMUSCULAR | Status: DC | PRN
  Administered 2014-07-04: 10 mg via INTRAVENOUS
  Filled 2014-07-04: qty 1

## 2014-07-04 MED ORDER — METOPROLOL TARTRATE 25 MG PO TABS
12.5000 mg | ORAL_TABLET | Freq: Two times a day (BID) | ORAL | Status: DC
  Administered 2014-07-04: 12.5 mg via ORAL
  Administered 2014-07-05: 25 mg via ORAL
  Administered 2014-07-05: 12.5 mg via ORAL
  Administered 2014-07-06: 25 mg via ORAL
  Administered 2014-07-06 – 2014-07-08 (×3): 12.5 mg via ORAL
  Filled 2014-07-04 (×8): qty 1

## 2014-07-04 MED ORDER — SODIUM CHLORIDE 0.9 % IV SOLN
Freq: Once | INTRAVENOUS | Status: AC
  Administered 2014-07-04: 06:00:00 via INTRAVENOUS

## 2014-07-04 MED ORDER — SODIUM CHLORIDE 0.9 % IV SOLN: Freq: Once | INTRAVENOUS | Status: DC

## 2014-07-04 NOTE — Progress Notes (Signed)
Initial Nutrition Assessment    INTERVENTION: Meals and Snacks: Cater to patient preferences   NUTRITION DIAGNOSIS:  No PES statement at this time    GOAL:  Patient will meet greater than or equal to 90% of their needs    MONITOR:   (Energy Intake, Anthropometrics, Digestive system, Electrolyte/Renal Profile)  REASON FOR ASSESSMENT:   (Diagnosis) Dialysis    ASSESSMENT:  Pt admitted with pulmonary edema, plan for HD today  Past Medical History  Diagnosis Date  . COPD (chronic obstructive pulmonary disease)   . ESRD on hemodialysis   . DM2 (diabetes mellitus, type 2)   . Hepatitis C   . Migraine with aura   . HOH (hard of hearing)    Diet Order: Renal  Energy Intake: recorded po intake 100% at lunch today, appetite good  Electrolyte and Renal Profile:  Recent Labs Lab 07/03/14 0933 07/03/14 0934  BUN  --  27*  CREATININE  --  6.57*  NA  --  137  K  --  3.4*  MG 1.8  --    Protein Profile:  Recent Labs Lab 07/03/14 0934  ALBUMIN 3.1*   Meds: lasix, plavix, renvela, tums, sensipar, nephrovite   Height:  Ht Readings from Last 1 Encounters:  06/21/14 5\' 2"  (1.575 m)    Weight:  Wt Readings from Last 1 Encounters:  07/03/14 202 lb 13.2 oz (92 kg)   Filed Weights   07/03/14 0800 07/03/14 1500  Weight: 205 lb (92.987 kg) 202 lb 13.2 oz (92 kg)     Ideal Body Weight:  50 kg  Wt Readings from Last 10 Encounters:  07/03/14 202 lb 13.2 oz (92 kg)  06/22/14 199 lb 12.8 oz (90.629 kg)    BMI:  Body mass index is 37.09 kg/(m^2).     Intake/Output Summary (Last 24 hours) at 07/04/14 1434 Last data filed at 07/04/14 1207  Gross per 24 hour  Intake    520 ml  Output     50 ml  Net    470 ml    LOW Care Level  Kerman Passey MS, RD, LDN [phone] Pager

## 2014-07-04 NOTE — Progress Notes (Signed)
Pt with complaints of chills, temperature 100, was 98.3 at start of transfusion and 15 post transfusion initiation. Dr Lavetta Nielsen notified, verbal orders to continue transfusion. Will continue to monitor patient.

## 2014-07-04 NOTE — Care Management (Signed)
Patient presents for tachycardia from her dialysis center.  Her heart rate was in the 140's.  Dialysis coordinator notified of admission.  She is not on home chronic 02.  Was noted in the ED that room air sat of 87%.  At present, her heart continues to be sustained greater than 100.  Discussed during progression that need to assess for need for home 02

## 2014-07-04 NOTE — Progress Notes (Signed)
[place] [facility] [place] NAME: Kelsey Peterson    MR#:  [id number]  DATE OF BIRTH:  1955-08-24  SUBJECTIVE:  CHIEF COMPLAINT:   Chief Complaint  Patient presents with  . Flank Pain   feels okay, received 1 unit of packed red blood cell transfusion earlier today  REVIEW OF SYSTEMS:  Review of Systems  Constitutional: Positive for malaise/fatigue. Negative for fever, weight loss and diaphoresis.  HENT: Negative for ear discharge, ear pain, hearing loss, nosebleeds, sore throat and tinnitus.   Eyes: Negative for blurred vision and pain.  Respiratory: Positive for shortness of breath. Negative for cough, hemoptysis and wheezing.   Cardiovascular: Positive for palpitations. Negative for chest pain, orthopnea and leg swelling.  Gastrointestinal: Negative for heartburn, nausea, vomiting, abdominal pain, diarrhea, constipation and blood in stool.  Genitourinary: Negative for dysuria, urgency and frequency.  Musculoskeletal: Negative for myalgias and back pain.  Skin: Negative for itching and rash.  Neurological: Positive for weakness. Negative for dizziness, tingling, tremors, focal weakness, seizures and headaches.  Psychiatric/Behavioral: Negative for depression. The patient is not nervous/anxious.    DRUG ALLERGIES:   Allergies  Allergen Reactions  . Morphine And Related Hives  . Other Rash    Blood Pressure Pill (Pt doesn't remember name)    VITALS:  Blood pressure 148/90, pulse 102, temperature 98 F (36.7 C), temperature source Oral, resp. rate 18, weight 92 kg (202 lb 13.2 oz), SpO2 98 %. PHYSICAL EXAMINATION:  Physical Exam  Constitutional: She is oriented to person, place, and time and well-developed, well-nourished, and in no distress. She appears unhealthy. She has a sickly appearance.  HENT:  Head: Normocephalic and atraumatic.  Eyes: Conjunctivae and EOM are normal. Pupils are equal, round, and reactive to light.  Neck:  Normal range of motion. Neck supple. No tracheal deviation present. No thyromegaly present.  Cardiovascular: Normal rate, regular rhythm and normal heart sounds.   Right IJ dialysis catheter in place  Pulmonary/Chest: Effort normal and breath sounds normal. No respiratory distress. She has no wheezes. She exhibits no tenderness.  Abdominal: Soft. Bowel sounds are normal. She exhibits no distension. There is no tenderness.  Musculoskeletal: Normal range of motion.  Neurological: She is alert and oriented to person, place, and time. No cranial nerve deficit.  Skin: Skin is warm and dry. No rash noted.  Psychiatric: Mood normal. Her affect is blunt. She exhibits abnormal new learning ability. She has a flat affect.   LABORATORY PANEL:   CBC  Recent Labs Lab 07/04/14 0435 07/04/14 1323  WBC 8.2  --   HGB 5.8* 6.8*  HCT 17.9*  --   PLT 245  --    ------------------------------------------------------------------------------------------------------------------ Chemistries   Recent Labs Lab 07/03/14 0933 07/03/14 0934  NA  --  137  K  --  3.4*  CL  --  96*  CO2  --  29  GLUCOSE  --  96  BUN  --  27*  CREATININE  --  6.57*  CALCIUM  --  7.5*  MG 1.8  --   AST  --  32  ALT  --  20  ALKPHOS  --  80  BILITOT  --  1.6*   RADIOLOGY:  No results found. ASSESSMENT AND PLAN:   * Paroxysmal SVT: Likely due to severe anemia. We will add metoprolol for rate control.  Cardiology consultation  * Pulmonary edema with Fluid overload & hypoxia: Likely due to inadequate dialysis, nephrology to  dialyze her today  * Elevated troponin, possible due to demanding ischemia: Cardiology consultation.  She is not having any anginal symptoms,   * ESRD on hemodialysis: Nephrology planning for dialysis today   * Anemia of chronic kidney disease: Hemoglobin down to 5.8 on admission, requiring 1 unit of packed red blood cell transfusion with improvement of hemoglobin up to 6.8.  Will order 1 more  unit of packed red blood cell transfusion.  GI consultation will be obtained   All the records are reviewed and case discussed with Care Management/Social Workerr. Management plans discussed with the patient, family and they are in agreement.  CODE STATUS: Full Code   TOTAL TIME TAKING CARE OF THIS PATIENT: 35 minutes.   More than 50% of the time was spent in counseling/coordination of care: YES  POSSIBLE D/C IN 2-3 DAYS, DEPENDING ON CLINICAL CONDITION.   [facility], Emeka Lindner M.D on 07/04/2014 at 4:57 PM  Between 7am to 6pm - Pager - [phone]  After 6pm go to [email or web address] - password [email or web address] [email or web address]  [place] [facility]  Office  [phone]  CC:  Primary care physician; No primary care provider on file.

## 2014-07-04 NOTE — Consult Note (Addendum)
Cardiology Consultation Note  Patient ID: Kelsey Peterson, MRN: [id number], DOB/AGE: Jul 18, 1955 59 y.o. Admit date: 07/03/2014   Date of Consult: 07/04/2014 Primary Physician: No primary care provider on file. Primary Cardiologist: [facility]  Chief Complaint: Palpitations  Reason for Consult: SVT  HPI: 59 y.o. female with h/o reported CAD per Care Everywhere (no prior caths), ESRD on HD, HTN, COPD, DM2, hepatitis C, and migraine disorder who was recently admitted to [facility] 6/2-6/3 for acte gastroenteritis and demand ischemia in the setting of her GI illness and chronic anemia who presented to [facility] on 6/14 with reported SVT from EMS while undergoing dialysis.   She was recently consulted on by [facility] Cardiology on 06/11/2014 for increased SOB in the setting of needing needing AV graft revision. She was noted to have been having increased SOB over the past 4 months that were concerning for increased congestion in the setting of her ESRD. It was unclear if her symptoms were related to cardiac etiology vs her underlying renal disease with poor fluid balance in the setting of inadequate HD. Her EKG at that time was not concerning MI or ischemia. She was advised to return around 6/1, though did not.   She was recently admitted to [facility] 6/2-6/3 after presenting to her dialysis center for her session that morning with 2 week history fatigue, nausea, intermittent vomiting, SOB, and chest pain. She was noted to already be below her dry weight with no fluid to remove at that time, thus she was sent to [facility] ED for further evaluation. Upon her arrival to [facility] ED she was noted to have mild intermittent chest pain that had resolved. She was found to have a troponin of 0.28-->0.28-->0.23-->0.24 in the setting of her ESRD. (Prior troponin back in December in the setting of her bacteremia of 0.08). CXR showed interstitial airspace disease, possible edema vs infection. Of note, upon me questioning her about her symptoms at that time  she stated she did not want to talk about her chest pain anymore. Echo showed EF 60%, normal wall motion, GR1DD, mild AI, calcified mitral annulus, mild MR, no atrial septal defect or patent foramen ovale. She felt better in follow up on 6/3, without any further symptoms. It was felt she would need an ischemic outpatient evaluation given her elevated troponin.   She presented to [facility] on 6/14 from her dialysis center after reportedly developed a tachycardiac heart rate, per EMS that was SVT. She was also noted to have a nonexertional left-sided to left flank pain with associated SOB. No associated nausea, vomiting, diaphoresis, presyncope, or syncope.   Upon her arrival to [facility] she was found to be in SVT with heart rate of 140. EKG showed SVT. CXR showed pulmonary edema with small bilateral pleural effusions left greater than right. Labs showed a hgb of 7.1-->5.8--> transfusion-->6.8. Troponin 0.15-->0.03. K+ 3.4. Mg 1.8. Normal lipase. Her O2 sats were at 87%. She was given IV Lasix, nebs, oxygen, nitro, and IV diltiazem. She also received urgent HD. Heart rate did improve into the low 100's. Upon review of telemetry she would have brief episodes of sinus tachycardia in the low 100's followed by occasional episodes of brief SVT into the 130's. She reports she is feeling somewhat better this afternoon. She does tell me she has noted continued SOB. Telemetry shows heart rate into the 130's with SVT currently. Cardiology is consulted for the management of her SVT.   Past Medical History  Diagnosis Date  . COPD (chronic obstructive pulmonary  disease)   . ESRD on hemodialysis   . DM2 (diabetes mellitus, type 2)   . Hepatitis C   . Migraine with aura   . HOH (hard of hearing)   . Mitral regurgitation     a. echo 06/2014: EF 60%, no WMA, GR1DD, mild AI, calcified mitral annulus, mod MR, no atrial septal defect or patent foramen ovale   . Cognitive impairment       Most Recent Cardiac Studies: Echo  06/21/2014:  Study Conclusions  - Left ventricle: The cavity size was mildly dilated. There was mild concentric hypertrophy. Systolic function was normal. The estimated ejection fraction was 60%. Wall motion was normal; there were no regional wall motion abnormalities. Doppler parameters are consistent with abnormal left ventricular relaxation (grade 1 diastolic dysfunction). - Aortic valve: There was mild regurgitation. Valve area (Vmax): 2.99 cm^2. - Mitral valve: Calcified annulus. There was moderate regurgitation. - Atrial septum: No defect or patent foramen ovale was identified.  Impressions:  - Mild to moderately dilated LV/LA, and normal LVEF, mild diastolic dysfunction and moderate MR.   Surgical History:  Past Surgical History  Procedure Laterality Date  . Dialysis fistula creation    . Tubal ligation       Home Meds: Prior to Admission medications   Medication Sig Start Date End Date Taking? Authorizing Provider  albuterol (PROVENTIL HFA;VENTOLIN HFA) 108 (90 BASE) MCG/ACT inhaler Inhale 2 puffs into the lungs every 4 (four) hours as needed for wheezing or shortness of breath.    Yes Historical Provider, MD  amLODipine (NORVASC) 5 MG tablet Take 5 mg by mouth daily.   Yes Historical Provider, MD  cinacalcet (SENSIPAR) 60 MG tablet Take 60 mg by mouth daily. With largest meal   Yes Historical Provider, MD  clopidogrel (PLAVIX) 75 MG tablet Take 75 mg by mouth daily. 06/27/14 07/27/14 Yes Historical Provider, MD  esomeprazole (NEXIUM) 40 MG capsule Take 40 mg by mouth daily.    Yes Historical Provider, MD  ferrous sulfate 325 (65 FE) MG tablet Take 325 mg by mouth daily.   Yes Historical Provider, MD  folic acid-vitamin b complex-vitamin c-selenium-zinc (DIALYVITE) 3 MG TABS tablet Take 1 tablet by mouth daily.   Yes Historical Provider, MD  furosemide (LASIX) 80 MG tablet Take 80 mg by mouth daily. Patient takes on non dialysis days.   Yes Historical Provider,  MD  isosorbide mononitrate (IMDUR) 30 MG 24 hr tablet Take 30 mg by mouth daily.   Yes Historical Provider, MD  sevelamer carbonate (RENVELA) 800 MG tablet Take 800 mg by mouth 4 (four) times daily. Takes 3 times daily with meal and 1 time with a snack twice daily.   Yes Historical Provider, MD  acetaminophen (TYLENOL) 325 MG tablet Take 650 mg by mouth every 6 (six) hours as needed for moderate pain or fever.    Historical Provider, MD  calcium carbonate (TUMS - DOSED IN MG ELEMENTAL CALCIUM) 500 MG chewable tablet Chew 1 tablet by mouth daily.    Historical Provider, MD  Lidocaine-Prilocaine, Bulk, 2.2-0.2 % CREA 1 application by Other route as needed (for tx). Apply to site 20-45 minutes before tx.    Historical Provider, MD  traMADol (ULTRAM) 50 MG tablet Take 50 mg by mouth 2 (two) times daily as needed for moderate pain.    Historical Provider, MD    Inpatient Medications:  . amLODipine  5 mg Oral Daily  . b complex-vitamin c-folic acid  1 tablet Oral Daily  .  calcium carbonate  1 tablet Oral Daily  . cinacalcet  60 mg Oral Q breakfast  . clopidogrel  75 mg Oral Daily  . epoetin (EPOGEN/PROCRIT) injection  10,000 Units Subcutaneous Once  . ferrous sulfate  325 mg Oral Daily  . furosemide  80 mg Oral Daily  . isosorbide mononitrate  30 mg Oral Daily  . pantoprazole  40 mg Oral Daily  . sevelamer carbonate  800 mg Oral QID  . sodium chloride  3 mL Intravenous Q12H      Allergies:  Allergies  Allergen Reactions  . Morphine And Related Hives  . Other Rash    Blood Pressure Pill (Pt doesn't remember name)     History   Social History  . Marital Status: Single    Spouse Name: N/A  . Number of Children: 2  . Years of Education: N/A   Occupational History  . retired [employer]  . Smoking status: Never Smoker   . Smokeless tobacco: Never Used  . Alcohol Use: No  . Drug Use: No  . Sexual Activity: Not on file   Other Topics  Concern  . Not on file   Social History Narrative   Lives alone     Family History  Problem Relation Age of Onset  . Heart disease Mother   . Hypertension Mother      Review of Systems: Review of Systems  Constitutional: Positive for malaise/fatigue. Negative for fever, chills, weight loss and diaphoresis.  HENT: Negative for congestion.   Eyes: Negative for blurred vision, double vision, discharge and redness.  Respiratory: Negative for cough, hemoptysis, sputum production, shortness of breath and wheezing.   Cardiovascular: Positive for palpitations. Negative for chest pain, orthopnea, claudication, leg swelling and PND.  Gastrointestinal: Negative for heartburn, nausea, vomiting, diarrhea and constipation.  Musculoskeletal: Negative for myalgias and falls.  Skin: Negative for itching and rash.  Neurological: Positive for weakness. Negative for dizziness, speech change, focal weakness and headaches.  Endo/Heme/Allergies: Does not bruise/bleed easily.  Psychiatric/Behavioral: The patient is not nervous/anxious.   All other systems reviewed and are negative.   Labs:  Recent Labs  07/03/14 0933 07/03/14 1858  TROPONINI 0.15* 0.03   Lab Results  Component Value Date   WBC 8.2 07/04/2014   HGB 6.8* 07/04/2014   HCT 17.9* 07/04/2014   MCV 76.5* 07/04/2014   PLT 245 07/04/2014     Recent Labs Lab 07/03/14 0934  NA 137  K 3.4*  CL 96*  CO2 29  BUN 27*  CREATININE 6.57*  CALCIUM 7.5*  PROT 6.6  BILITOT 1.6*  ALKPHOS 80  ALT 20  AST 32  GLUCOSE 96   No results found for: CHOL, HDL, LDLCALC, TRIG No results found for: DDIMER  Radiology/Studies:  Dg Chest 1 View  07/03/2014   CLINICAL DATA:  Flank pain today.  Patient on dialysis.  EXAM: CHEST  1 VIEW  COMPARISON:  Single view of the chest 06/21/2014. PA and lateral chest 09/23/2013 and CT chest 0 9/ 06/2013.  FINDINGS: Right IJ approach dialysis catheter is again seen. There is marked enlargement of the  cardiopericardial silhouette. Small pleural effusions are identified, greater on the left. There is interstitial pulmonary edema. Basilar airspace disease is likely due to atelectasis.  IMPRESSION: Interstitial pulmonary edema with small bilateral pleural effusions, larger on the left.  Marked enlargement of the cardiopericardial silhouette compatible with cardiomegaly and/or pericardial effusion.   Electronically Signed  By: Inge Rise M.D.   On: 07/03/2014 08:16   Dg Chest Portable 1 View  06/21/2014   CLINICAL DATA:  59 year old female with a history of renal failure. Presents to the ED with nausea vomiting and diarrhea for 2 weeks. Right-sided chest pain.  EXAM: PORTABLE CHEST - 1 VIEW  COMPARISON:  02/23/2014  FINDINGS: Cardiomediastinal silhouette persistently enlarged.  Fullness in the hilar vessels.  Persisting mixed interstitial and airspace disease at of the right base. Left base not well evaluated.  Interval placement of left IJ approach split tip hemodialysis catheter, appearing to terminate in the superior atrium.  Unremarkable skeletal structures.  IMPRESSION: Interstitial and airspace disease at the right base, potentially edema or infection. Left base not well evaluated.  Cardiomegaly.  Interval placement of split tip hemodialysis catheter from right IJ approach appearing to terminate in the superior atrium.  Signed,  Dulcy Fanny. Earleen Newport, DO  [facility]  [facility]   Electronically Signed   By: Corrie Mckusick D.O.   On: 06/21/2014 10:54    EKG: SVT, 138 bpm, no significant st/t changes   Weights: Filed Weights   07/03/14 0800 07/03/14 1500  Weight: 205 lb (92.987 kg) 202 lb 13.2 oz (92 kg)     Physical Exam: Blood pressure 148/90, pulse 102, temperature 98 F (36.7 C), temperature source Oral, resp. rate 18, weight 202 lb 13.2 oz (92 kg), SpO2 98 %. Body mass index is 37.09 kg/(m^2). General: Well developed, well nourished, in no  acute distress. Head: Normocephalic, atraumatic, sclera non-icteric, no xanthomas, nares are without discharge.  Neck: Negative for carotid bruits. JVD not elevated. Lungs: Clear bilaterally to auscultation without wheezes, rales, or rhonchi. Breathing is unlabored. Heart: Tachycardic with S1 S2. II/VI systolic murmurs. No rubs or gallops appreciated. Abdomen: Soft, non-tender, non-distended with normoactive bowel sounds. No hepatomegaly. No rebound/guarding. No obvious abdominal masses. Msk:  Strength and tone appear normal for age. Extremities: No clubbing or cyanosis. No edema.  Distal pedal pulses are 2+ and equal bilaterally. Neuro: Alert and oriented X 3. No facial asymmetry. No focal deficit. Moves all extremities spontaneously. Psych:  Responds to questions appropriately with a normal affect.    Assessment and Plan:  59 y.o. female with h/o reported CAD per Care Everywhere (no prior caths), ESRD on HD, HTN, COPD, DM2, hepatitis C, and migraine disorder who was recently admitted to [facility] 6/2-6/3 for acte gastroenteritis and demand ischemia in the setting of her GI illness and chronic anemia who presented to [facility] on 6/14 with reported SVT from EMS while undergoing dialysis.  1. Paroxysmal SVT: -Likely exacerbated by her acute on chronic anemia with hgb dipping down to 5.8, s/p 1 unit pRBC with improvement to 6.8 -Telemetry indicates episodes of SVT into the 130's that will spontaneously break. Her current baseline heart rate is slightly tachycardic in the low 100's likely multifactorial in the setting of her acute on chronic anemia and her SOB -Telemetry currently indicates SVT with pulse in the 130's. She has plenty of blood pressure room for addition of beta blocker  -Could add metoprolol 12.5 mg bid to aid in rate control and treat underlying anemia back to baseline hgb     2. Elevated troponin: -Mildly elevated at 0.15-->0.03, likely in the setting of supply demand ischemia 2/2 acute  on chronic anemia with hgb of 5.8 and SVT with heart rate into the 130's  -No anginal symptoms  -Would continue with prior plans for outpatient ischemic evaluation  via nuclear stress testing   3. SOB: -Likely worsened by her acute on chronic anemia and volume overload/pulmonary edema seen on CXR -Volume management via HD   3. Acute on chronic anemia: -HGB down to 5.8 upon admission, s/p 1 unit pRBC with improvement to 6.8 in hgb -On Epogen -GI eval pending -Likely contributing to #1 and #3  4. ESRD on HD: -Pulmonary edema  -HD as tolerated per Renal   5. HTN: -Lopressor added as above -Continue current meds  6. Cognitive impairment      Signed, Christell Faith, PA-C Pager: [phone] 07/04/2014, 3:43 PM   Attending Note Patient seen and examined, agree with detailed note above,  Patient presentation and plan discussed on rounds.   Telemetry documenting narrow complex tachycardia, most consistent with reentrant rhythm/SVT Rate up to 140 bpm at rest. She is symptomatic, reports having periods of chest discomfort. Possible angina in the setting of tachycardia and anemia. Nonsustained VT also noted on telemetry. Given the frequency of episodes,, --- recommend we start amiodarone by mouth with 400 mg by mouth twice a day loading dose over 5 days, then down to 200 mg by mouth twice a day. Can also use low-dose beta blockers if heart rate permits.  ----Would continue with transfusion to achieve hemoglobin 10.0 or greater ---Would repeat potassium    Signed Esmond Plants  M.D., Ph.D. [facility]

## 2014-07-04 NOTE — Progress Notes (Signed)
Subjective:   Patient known to our practice from outpatient She is admitted from dialysis.  During middle of treatment he had a run of tachycardia an dc/o chest discomfort to the Nurse BFR was slowed but did not make any difference therefore she was sent to the ER Hgb was noted to be low She has received blood transfusion this admission  Objective:  Vital signs in last 24 hours:  Temp:  [97.9 F (36.6 C)-98.3 F (36.8 C)] 98 F (36.7 C) (06/15 1219) Pulse Rate:  [82-105] 102 (06/15 1219) Resp:  [18-30] 18 (06/15 1219) BP: (140-154)/(81-98) 148/90 mmHg (06/15 1219) SpO2:  [97 %-100 %] 98 % (06/15 1219) Weight:  [92 kg (202 lb 13.2 oz)] 92 kg (202 lb 13.2 oz) (06/14 1500)  Weight change:  Filed Weights   07/03/14 0800 07/03/14 1500  Weight: 92.987 kg (205 lb) 92 kg (202 lb 13.2 oz)    Intake/Output: I/O last 3 completed shifts: In: -  Out: 50 [Urine:50]     Physical Exam: General: NAD, laying in bed  HEENT Anicteric, moist mucus membranes  Neck supple  Pulm/lungs Crackles at bases b/l  CVS/Heart Irregular, no rub  Abdomen:  Soft, Non tender  Extremities: Trace edema  Neurologic: Non focal  Skin: No acute rashes  Access: Rt IJ PC       Basic Metabolic Panel:  Recent Labs Lab 07/03/14 0933 07/03/14 0934  NA  --  137  K  --  3.4*  CL  --  96*  CO2  --  29  GLUCOSE  --  96  BUN  --  27*  CREATININE  --  6.57*  CALCIUM  --  7.5*  MG 1.8  --      CBC:  Recent Labs Lab 07/03/14 0934 07/04/14 0435  WBC 10.9 8.2  NEUTROABS 9.4*  --   HGB 7.1* 5.8*  HCT 21.9* 17.9*  MCV 77.3* 76.5*  PLT 302 245      Microbiology: Results for orders placed or performed in visit on 02/23/14  Clostridium Difficile [facility])     Status: None   Collection Time: 02/23/14 10:11 AM  Result Value Ref Range Status   Micro Text Report   Final       C.DIFFICILE ANTIGEN       C.DIFFICILE GDH ANTIGEN : NEGATIVE   C.DIFFICILE TOXIN A/B     C.DIFFICILE TOXINS A AND B :  NEGATIVE   INTERPRETATION            Negative for C. difficile.    ANTIBIOTIC                                                      Culture, blood (single)     Status: None (Preliminary result)   Collection Time: 02/23/14  1:36 PM  Result Value Ref Range Status   Micro Text Report   Preliminary       COMMENT                   NO GROWTH IN 48 HOURS   ANTIBIOTIC  Culture, blood (single)     Status: None (Preliminary result)   Collection Time: 02/23/14  2:17 PM  Result Value Ref Range Status   Micro Text Report   Preliminary       COMMENT                   NO GROWTH IN 48 HOURS   ANTIBIOTIC                                                        Coagulation Studies: No results for input(s): LABPROT, INR in the last 72 hours.  Urinalysis: No results for input(s): COLORURINE, LABSPEC, PHURINE, GLUCOSEU, HGBUR, BILIRUBINUR, KETONESUR, PROTEINUR, UROBILINOGEN, NITRITE, LEUKOCYTESUR in the last 72 hours.  Invalid input(s): APPERANCEUR    Imaging: Dg Chest 1 View  07/03/2014   CLINICAL DATA:  Flank pain today.  Patient on dialysis.  EXAM: CHEST  1 VIEW  COMPARISON:  Single view of the chest 06/21/2014. PA and lateral chest 09/23/2013 and CT chest 0 9/ 06/2013.  FINDINGS: Right IJ approach dialysis catheter is again seen. There is marked enlargement of the cardiopericardial silhouette. Small pleural effusions are identified, greater on the left. There is interstitial pulmonary edema. Basilar airspace disease is likely due to atelectasis.  IMPRESSION: Interstitial pulmonary edema with small bilateral pleural effusions, larger on the left.  Marked enlargement of the cardiopericardial silhouette compatible with cardiomegaly and/or pericardial effusion.   Electronically Signed   By: Inge Rise M.D.   On: 07/03/2014 08:16     Medications:     . amLODipine  5 mg Oral Daily  . b complex-vitamin c-folic acid  1 tablet Oral Daily  .  calcium carbonate  1 tablet Oral Daily  . cinacalcet  60 mg Oral Q breakfast  . clopidogrel  75 mg Oral Daily  . ferrous sulfate  325 mg Oral Daily  . furosemide  80 mg Oral Daily  . heparin  5,000 Units Subcutaneous 3 times per day  . isosorbide mononitrate  30 mg Oral Daily  . pantoprazole  40 mg Oral Daily  . sevelamer carbonate  800 mg Oral QID  . 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  ESRD on HD TTHS:  - patient did not complete her dialysis yesterday - will get her dialyzed today  2. Pulmonary edema - UF with HD as tolerated  3  AOCKD:  continue epogen with HD.  status post transfusion 2/6, June 14 - patient not able to tolerate iv iron - continue EPO - GI eval - Anemia probably contributing to Shortness of Breath  4.  SHPTH of renal origin: Continue sevelamer. - monitor phos    LOS: 1 Ellenora Talton 6/15/201612:52 PM

## 2014-07-04 NOTE — Care Management Note (Signed)
Patient has out patient dialysis at [facility] on TTS schedule.  I will update clinic with medical records at discharge. Kelsey Peterson [phone]

## 2014-07-05 LAB — TYPE AND SCREEN
ABO/RH(D): O NEG
ANTIBODY SCREEN: NEGATIVE
UNIT DIVISION: 0
Unit division: 0

## 2014-07-05 LAB — HEMOGLOBIN AND HEMATOCRIT, BLOOD
HCT: 23.9 % — ABNORMAL LOW (ref 35.0–47.0)
HEMOGLOBIN: 7.8 g/dL — AB (ref 12.0–16.0)

## 2014-07-05 MED ORDER — HEPARIN SODIUM (PORCINE) 1000 UNIT/ML IJ SOLN
4200.0000 [IU] | Freq: Once | INTRAMUSCULAR | Status: AC
  Administered 2014-07-05: 4200 [IU] via INTRAVENOUS

## 2014-07-05 MED ORDER — PIPERACILLIN-TAZOBACTAM 3.375 G IVPB
3.3750 g | Freq: Two times a day (BID) | INTRAVENOUS | Status: DC
  Administered 2014-07-05 – 2014-07-08 (×6): 3.375 g via INTRAVENOUS
  Filled 2014-07-05 (×8): qty 50

## 2014-07-05 MED ORDER — PIPERACILLIN-TAZOBACTAM 3.375 G IVPB 30 MIN
3.3750 g | Freq: Two times a day (BID) | INTRAVENOUS | Status: DC
  Filled 2014-07-05 (×2): qty 50

## 2014-07-05 NOTE — Progress Notes (Signed)
Subjective:   Patient known to our practice from outpatient She was admitted from dialysis for tachycardia and chest pressure.  She had SVT last night therefore dialysis could not be done HR has improved this AM  Objective:  Vital signs in last 24 hours:  Temp:  [97.9 F (36.6 C)-100.3 F (37.9 C)] 97.9 F (36.6 C) (06/16 0800) Pulse Rate:  [76-145] 76 (06/16 0800) Resp:  [18-22] 22 (06/16 0800) BP: (123-188)/(73-125) 141/86 mmHg (06/16 0800) SpO2:  [92 %-100 %] 93 % (06/16 0800) Weight:  [95.981 kg (211 lb 9.6 oz)] 95.981 kg (211 lb 9.6 oz) (06/16 0544)  Weight change: 2.994 kg (6 lb 9.6 oz) Filed Weights   07/03/14 0800 07/03/14 1500 07/05/14 0544  Weight: 92.987 kg (205 lb) 92 kg (202 lb 13.2 oz) 95.981 kg (211 lb 9.6 oz)    Intake/Output: I/O last 3 completed shifts: In: 920 [P.O.:340; Blood:580] Out: 150 [Urine:150]     Physical Exam: General: NAD, laying in bed  HEENT Anicteric, moist mucus membranes  Neck supple  Pulm/lungs Crackles at bases b/l  CVS/Heart Irregular, no rub  Abdomen:  Soft, Non tender  Extremities: Trace edema  Neurologic: Non focal  Skin: No acute rashes  Access: Rt IJ PC       Basic Metabolic Panel:  Recent Labs Lab 07/03/14 0933 07/03/14 0934  NA  --  137  K  --  3.4*  CL  --  96*  CO2  --  29  GLUCOSE  --  96  BUN  --  27*  CREATININE  --  6.57*  CALCIUM  --  7.5*  MG 1.8  --      CBC:  Recent Labs Lab 07/03/14 0934 07/04/14 0435 07/04/14 1323 07/05/14 0118  WBC 10.9 8.2  --   --   NEUTROABS 9.4*  --   --   --   HGB 7.1* 5.8* 6.8* 7.8*  HCT 21.9* 17.9*  --  23.9*  MCV 77.3* 76.5*  --   --   PLT 302 245  --   --       Microbiology: Results for orders placed or performed in visit on 02/23/14  Clostridium Difficile [facility])     Status: None   Collection Time: 02/23/14 10:11 AM  Result Value Ref Range Status   Micro Text Report   Final       C.DIFFICILE ANTIGEN       C.DIFFICILE GDH ANTIGEN : NEGATIVE  C.DIFFICILE TOXIN A/B     C.DIFFICILE TOXINS A AND B : NEGATIVE   INTERPRETATION            Negative for C. difficile.    ANTIBIOTIC                                                      Culture, blood (single)     Status: None (Preliminary result)   Collection Time: 02/23/14  1:36 PM  Result Value Ref Range Status   Micro Text Report   Preliminary       COMMENT                   NO GROWTH IN 48 HOURS   ANTIBIOTIC  Culture, blood (single)     Status: None (Preliminary result)   Collection Time: 02/23/14  2:17 PM  Result Value Ref Range Status   Micro Text Report   Preliminary       COMMENT                   NO GROWTH IN 48 HOURS   ANTIBIOTIC                                                        Coagulation Studies: No results for input(s): LABPROT, INR in the last 72 hours.  Urinalysis:  Recent Labs  07/04/14 2041  COLORURINE AMBER*  LABSPEC 1.014  PHURINE 9.0*  GLUCOSEU 50*  HGBUR 1+*  BILIRUBINUR NEGATIVE  KETONESUR NEGATIVE  PROTEINUR 100*  NITRITE NEGATIVE  LEUKOCYTESUR NEGATIVE      Imaging: No results found.   Medications:     . sodium chloride   Intravenous Once  . amiodarone  400 mg Oral BID  . amLODipine  5 mg Oral Daily  . b complex-vitamin c-folic acid  1 tablet Oral Daily  . calcium carbonate  1 tablet Oral Daily  . cinacalcet  60 mg Oral Q breakfast  . clopidogrel  75 mg Oral Daily  . epoetin (EPOGEN/PROCRIT) injection  10,000 Units Subcutaneous Once  . ferrous sulfate  325 mg Oral Daily  . furosemide  80 mg Oral Daily  . isosorbide mononitrate  30 mg Oral Daily  . metoprolol tartrate  12.5 mg Oral BID  . pantoprazole  40 mg Oral Daily  . sevelamer carbonate  800 mg Oral QID  . 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  ESRD on HD TTHS:  - patient did not undergo dialysis yesterday - will get her dialyzed today  2. Pulmonary edema - UF with HD as tolerated  3  AOCKD:  continue epogen with HD.  status post transfusion 2/6, June 14 - patient not able to tolerate iv iron - continue EPO - GI eval - Anemia probably contributing to Shortness of Breath  4.  SHPTH of renal origin: Continue sevelamer. - monitor phos    LOS: 2 Fedor Kazmierski 6/16/201610:29 AM

## 2014-07-05 NOTE — Care Management (Signed)
Readdressed the need to assess for possible need of home 02 during progression.  Patient lives alone.  She has a walker that was left at [facility] when patient transferred to ED.  Would be agreeable to home health nursing at discharge.  Patient is very hard of hearing.  Says she lives alone and independent in all her adls.  Spoke with patient's son Chrissie Noa and he confirms.  He will pick up patient's walker from [place].  Uses [facility] for transport to dialysis.  Margorie John will transport patient home at discharge

## 2014-07-05 NOTE — Progress Notes (Signed)
V/S temp=101.1, pulse=101, BP=169/101, resp=40, 02 sat 98% on 3L/[place]  Dr. Bridgett Larsson notified with new order for Blood culture 2 set stat and Zosyn per pharmacy dosing. Patient just received Tylenol 650mg  PRN for the  Temp and Amiodarone 400 mg for the  HR. Metoprolol will be administered  soon.

## 2014-07-05 NOTE — Progress Notes (Signed)
[place] [facility] [place] NAME: Kelsey Peterson    MR#:  [id number]  DATE OF BIRTH:  1955-10-18  SUBJECTIVE:  CHIEF COMPLAINT:   Chief Complaint  Patient presents with  . Flank Pain  couldn't finish HD y'day as HR went up again, received another unit of PRBC and hb up appropriately  REVIEW OF SYSTEMS:  Review of Systems  Constitutional: Positive for malaise/fatigue. Negative for fever, weight loss and diaphoresis.  HENT: Negative for ear discharge, ear pain, hearing loss, nosebleeds, sore throat and tinnitus.   Eyes: Negative for blurred vision and pain.  Respiratory: Positive for shortness of breath. Negative for cough, hemoptysis and wheezing.   Cardiovascular: Positive for palpitations. Negative for chest pain, orthopnea and leg swelling.  Gastrointestinal: Negative for heartburn, nausea, vomiting, abdominal pain, diarrhea, constipation and blood in stool.  Genitourinary: Negative for dysuria, urgency and frequency.  Musculoskeletal: Negative for myalgias and back pain.  Skin: Negative for itching and rash.  Neurological: Positive for weakness. Negative for dizziness, tingling, tremors, focal weakness, seizures and headaches.  Psychiatric/Behavioral: Negative for depression. The patient is not nervous/anxious.    DRUG ALLERGIES:   Allergies  Allergen Reactions  . Morphine And Related Hives  . Other Rash    Blood Pressure Pill (Pt doesn't remember name)    VITALS:  Blood pressure 169/101, pulse 102, temperature 101.1 F (38.4 C), temperature source Oral, resp. rate 40, weight 91 kg (200 lb 9.9 oz), SpO2 98 %. PHYSICAL EXAMINATION:  Physical Exam  Constitutional: She is oriented to person, place, and time and well-developed, well-nourished, and in no distress. She appears unhealthy. She has a sickly appearance.  HENT:  Head: Normocephalic and atraumatic.  Eyes: Conjunctivae and EOM are normal. Pupils are equal, round, and  reactive to light.  Neck: Normal range of motion. Neck supple. No tracheal deviation present. No thyromegaly present.  Cardiovascular: Normal rate, regular rhythm and normal heart sounds.   Right IJ dialysis catheter in place  Pulmonary/Chest: Effort normal and breath sounds normal. No respiratory distress. She has no wheezes. She exhibits no tenderness.  Abdominal: Soft. Bowel sounds are normal. She exhibits no distension. There is no tenderness.  Musculoskeletal: Normal range of motion.  Neurological: She is alert and oriented to person, place, and time. No cranial nerve deficit.  Skin: Skin is warm and dry. No rash noted.  Psychiatric: Mood normal. Her affect is blunt. She exhibits abnormal new learning ability. She has a flat affect.   LABORATORY PANEL:   CBC  Recent Labs Lab 07/04/14 0435  07/05/14 0118  WBC 8.2  --   --   HGB 5.8*  < > 7.8*  HCT 17.9*  --  23.9*  PLT 245  --   --   < > = values in this interval not displayed. ------------------------------------------------------------------------------------------------------------------ Chemistries   Recent Labs Lab 07/03/14 0933 07/03/14 0934  NA  --  137  K  --  3.4*  CL  --  96*  CO2  --  29  GLUCOSE  --  96  BUN  --  27*  CREATININE  --  6.57*  CALCIUM  --  7.5*  MG 1.8  --   AST  --  32  ALT  --  20  ALKPHOS  --  80  BILITOT  --  1.6*    ASSESSMENT AND PLAN:   * Paroxysmal SVT: Likely due to severe anemia. continue metoprolol for rate control.  Cardiology  following  * Pulmonary edema with Fluid overload & hypoxia: Likely due to inadequate dialysis, nephrology to dialyze her today  * Elevated troponin, possible due to demanding ischemia: Cardiology consultation.  She is not having any anginal symptoms,   * ESRD on hemodialysis: Nephrology planning for dialysis today   * Anemia of chronic kidney disease: Hemoglobin down to 5.8 on admission, received 2 unit of packed red blood cell transfusion with  improvement of hemoglobin up to 7.8.  GI consultation    All the records are reviewed and case discussed with Care Management/Social Workerr. Management plans discussed with the patient, family and they are in agreement.  CODE STATUS: Full Code   TOTAL TIME TAKING CARE OF THIS PATIENT: 35 minutes.   More than 50% of the time was spent in counseling/coordination of care: YES  POSSIBLE D/C IN 2-3 DAYS, DEPENDING ON CLINICAL CONDITION.   [facility], Bennie Chirico M.D on 07/05/2014 at 8:05 PM  Between 7am to 6pm - Pager - [phone]  After 6pm go to [email or web address] - password [email or web address]  [facility]  Office  [phone]  CC:  Primary care physician; No primary care provider on file.

## 2014-07-05 NOTE — Care Management (Signed)
Referral made to [facility] for home health nursing.  At present do not find documentation of assessment for home 02.

## 2014-07-05 NOTE — Progress Notes (Signed)
ANTIBIOTIC CONSULT NOTE - INITIAL  Pharmacy Consult for Zosyn Indication: pneumonia  Allergies  Allergen Reactions  . Morphine And Related Hives  . Other Rash    Blood Pressure Pill (Pt doesn't remember name)     Patient Measurements: Weight: 200 lb 9.9 oz (91 kg) Adjusted Body Weight:   Vital Signs: Temp: 101.1 F (38.4 C) (06/16 1937) Temp Source: Oral (06/16 1937) BP: 169/101 mmHg (06/16 1937) Pulse Rate: 102 (06/16 1937) Intake/Output from previous day: 06/15 0701 - 06/16 0700 In: 920 [P.O.:340; Blood:580] Out: 100 [Urine:100] Intake/Output from this shift:    Labs:  Recent Labs  07/03/14 0934 07/04/14 0435 07/04/14 1323 07/05/14 0118  WBC 10.9 8.2  --   --   HGB 7.1* 5.8* 6.8* 7.8*  PLT 302 245  --   --   CREATININE 6.57*  --   --   --    Estimated Creatinine Clearance: 9.6 mL/min (by C-G formula based on Cr of 6.57). No results for input(s): VANCOTROUGH, VANCOPEAK, VANCORANDOM, GENTTROUGH, GENTPEAK, GENTRANDOM, TOBRATROUGH, TOBRAPEAK, TOBRARND, AMIKACINPEAK, AMIKACINTROU, AMIKACIN in the last 72 hours.   Microbiology: No results found for this or any previous visit (from the past 720 hour(s)).  Medical History: Past Medical History  Diagnosis Date  . COPD (chronic obstructive pulmonary disease)   . ESRD on hemodialysis   . DM2 (diabetes mellitus, type 2)   . Hepatitis C   . Migraine with aura   . HOH (hard of hearing)   . Mitral regurgitation     a. echo 06/2014: EF 60%, no WMA, GR1DD, mild AI, calcified mitral annulus, mod MR, no atrial septal defect or patent foramen ovale   . Cognitive impairment     Medications:  Prescriptions prior to admission  Medication Sig Dispense Refill Last Dose  . albuterol (PROVENTIL HFA;VENTOLIN HFA) 108 (90 BASE) MCG/ACT inhaler Inhale 2 puffs into the lungs every 4 (four) hours as needed for wheezing or shortness of breath.    07/02/2014 at Unknown time  . amLODipine (NORVASC) 5 MG tablet Take 5 mg by mouth daily.    07/02/2014 at Unknown time  . cinacalcet (SENSIPAR) 60 MG tablet Take 60 mg by mouth daily. With largest meal   07/02/2014 at Unknown time  . clopidogrel (PLAVIX) 75 MG tablet Take 75 mg by mouth daily.   07/02/2014 at Unknown time  . esomeprazole (NEXIUM) 40 MG capsule Take 40 mg by mouth daily.    07/02/2014 at Unknown time  . ferrous sulfate 325 (65 FE) MG tablet Take 325 mg by mouth daily.   07/02/2014 at Unknown time  . folic acid-vitamin b complex-vitamin c-selenium-zinc (DIALYVITE) 3 MG TABS tablet Take 1 tablet by mouth daily.   07/02/2014 at Unknown time  . furosemide (LASIX) 80 MG tablet Take 80 mg by mouth daily. Patient takes on non dialysis days.   07/02/2014 at Unknown time  . isosorbide mononitrate (IMDUR) 30 MG 24 hr tablet Take 30 mg by mouth daily.   07/02/2014 at Unknown time  . sevelamer carbonate (RENVELA) 800 MG tablet Take 800 mg by mouth 4 (four) times daily. Takes 3 times daily with meal and 1 time with a snack twice daily.   07/02/2014 at Unknown time  . acetaminophen (TYLENOL) 325 MG tablet Take 650 mg by mouth every 6 (six) hours as needed for moderate pain or fever.   unknown at unknown  . calcium carbonate (TUMS - DOSED IN MG ELEMENTAL CALCIUM) 500 MG chewable tablet Chew 1 tablet  by mouth daily.   unknown at unknown  . Lidocaine-Prilocaine, Bulk, 2.6-4.1 % CREA 1 application by Other route as needed (for tx). Apply to site 20-45 minutes before tx.   unknown at unknown  . traMADol (ULTRAM) 50 MG tablet Take 50 mg by mouth 2 (two) times daily as needed for moderate pain.   unknown at unknown   Assessment: No Pseudomonas risk factors noted. CrCl = 9.6 ml/min  Goal of Therapy:  resolution of infection  Plan:  Zosyn 3.375 gm IV Q12H EI ordered.   Masami Plata D 07/05/2014,8:21 PM

## 2014-07-05 NOTE — Progress Notes (Signed)
Patient: Kelsey Peterson / Admit Date: 07/03/2014 / Date of Encounter: 07/05/2014, 8:03 AM   Subjective: Palpitations over night. Has not ambulated to assess heart rate. No chest pain. Off oxygen without issues.   Review of Systems: Review of Systems  Constitutional: Positive for malaise/fatigue. Negative for fever, chills, weight loss and diaphoresis.  Eyes: Negative for blurred vision, pain, discharge and redness.  Respiratory: Positive for shortness of breath. Negative for cough, hemoptysis, sputum production and wheezing.   Cardiovascular: Positive for palpitations. Negative for chest pain, orthopnea, claudication, leg swelling and PND.  Gastrointestinal: Negative for heartburn, nausea and vomiting.  Musculoskeletal: Negative for myalgias.  Skin: Negative for itching and rash.  Neurological: Positive for weakness. Negative for dizziness and speech change.  Endo/Heme/Allergies: Does not bruise/bleed easily.  Psychiatric/Behavioral: Negative for depression. The patient is nervous/anxious.   All other systems reviewed and are negative.    Objective: Telemetry: NSR, 70's. Last episode of SVT into the 130's at 7:30 this AM lasting a few seconds, occasional episodes into the 80's to 90's.  Physical Exam: Blood pressure 123/73, pulse 78, temperature 98.4 F (36.9 C), temperature source Oral, resp. rate 20, weight 211 lb 9.6 oz (95.981 kg), SpO2 98 %. Body mass index is 38.69 kg/(m^2). General: Well developed, well nourished, in no acute distress. Head: Normocephalic, atraumatic, sclera non-icteric, no xanthomas, nares are without discharge. Neck: Negative for carotid bruits. JVP not elevated. Lungs: Crackles along bilateral bases. Without wheezes or rhonchi. Breathing is unlabored. Heart: RRR S1 S2. II/VI systolic murmurs. No rubs or gallops.  Abdomen: Soft, non-tender, non-distended with normoactive bowel sounds. No rebound/guarding. Extremities: No clubbing or cyanosis. No edema.  Distal pedal pulses are 2+ and equal bilaterally. Neuro: Alert and oriented X 3. Moves all extremities spontaneously. Psych:  Responds to questions appropriately with a normal affect.   Intake/Output Summary (Last 24 hours) at 07/05/14 0803 Last data filed at 07/04/14 2326  Gross per 24 hour  Intake    920 ml  Output    100 ml  Net    820 ml    Inpatient Medications:  . sodium chloride   Intravenous Once  . amiodarone  400 mg Oral BID  . amLODipine  5 mg Oral Daily  . b complex-vitamin c-folic acid  1 tablet Oral Daily  . calcium carbonate  1 tablet Oral Daily  . cinacalcet  60 mg Oral Q breakfast  . clopidogrel  75 mg Oral Daily  . epoetin (EPOGEN/PROCRIT) injection  10,000 Units Subcutaneous Once  . ferrous sulfate  325 mg Oral Daily  . furosemide  80 mg Oral Daily  . isosorbide mononitrate  30 mg Oral Daily  . metoprolol tartrate  12.5 mg Oral BID  . pantoprazole  40 mg Oral Daily  . sevelamer carbonate  800 mg Oral QID  . sodium chloride  3 mL Intravenous Q12H   Infusions:    Labs:  Recent Labs  07/03/14 0933 07/03/14 0934  NA  --  137  K  --  3.4*  CL  --  96*  CO2  --  29  GLUCOSE  --  96  BUN  --  27*  CREATININE  --  6.57*  CALCIUM  --  7.5*  MG 1.8  --     Recent Labs  07/03/14 0934  AST 32  ALT 20  ALKPHOS 80  BILITOT 1.6*  PROT 6.6  ALBUMIN 3.1*    Recent Labs  07/03/14 0934 07/04/14 0435  07/04/14 1323 07/05/14 0118  WBC 10.9 8.2  --   --   NEUTROABS 9.4*  --   --   --   HGB 7.1* 5.8* 6.8* 7.8*  HCT 21.9* 17.9*  --  23.9*  MCV 77.3* 76.5*  --   --   PLT 302 245  --   --     Recent Labs  07/03/14 0933 07/03/14 1858  TROPONINI 0.15* 0.03   Invalid input(s): POCBNP No results for input(s): HGBA1C in the last 72 hours.   Weights: Filed Weights   07/03/14 0800 07/03/14 1500 07/05/14 0544  Weight: 205 lb (92.987 kg) 202 lb 13.2 oz (92 kg) 211 lb 9.6 oz (95.981 kg)     Radiology/Studies:  Dg Chest 1 View  07/03/2014    CLINICAL DATA:  Flank pain today.  Patient on dialysis.  EXAM: CHEST  1 VIEW  COMPARISON:  Single view of the chest 06/21/2014. PA and lateral chest 09/23/2013 and CT chest 0 9/ 06/2013.  FINDINGS: Right IJ approach dialysis catheter is again seen. There is marked enlargement of the cardiopericardial silhouette. Small pleural effusions are identified, greater on the left. There is interstitial pulmonary edema. Basilar airspace disease is likely due to atelectasis.  IMPRESSION: Interstitial pulmonary edema with small bilateral pleural effusions, larger on the left.  Marked enlargement of the cardiopericardial silhouette compatible with cardiomegaly and/or pericardial effusion.   Electronically Signed   By: Inge Rise M.D.   On: 07/03/2014 08:16   Dg Chest Portable 1 View  06/21/2014   CLINICAL DATA:  59 year old female with a history of renal failure. Presents to the ED with nausea vomiting and diarrhea for 2 weeks. Right-sided chest pain.  EXAM: PORTABLE CHEST - 1 VIEW  COMPARISON:  02/23/2014  FINDINGS: Cardiomediastinal silhouette persistently enlarged.  Fullness in the hilar vessels.  Persisting mixed interstitial and airspace disease at of the right base. Left base not well evaluated.  Interval placement of left IJ approach split tip hemodialysis catheter, appearing to terminate in the superior atrium.  Unremarkable skeletal structures.  IMPRESSION: Interstitial and airspace disease at the right base, potentially edema or infection. Left base not well evaluated.  Cardiomegaly.  Interval placement of split tip hemodialysis catheter from right IJ approach appearing to terminate in the superior atrium.  Signed,  Dulcy Fanny. Earleen Newport, DO  [facility]  [facility]   Electronically Signed   By: Corrie Mckusick D.O.   On: 06/21/2014 10:54     Assessment and Plan  59 y.o. female with h/o reported CAD per Care Everywhere (no prior caths), ESRD on HD, HTN, COPD, DM2,  hepatitis C, and migraine disorder who was recently admitted to [facility] 6/2-6/3 for acte gastroenteritis and demand ischemia in the setting of her GI illness and chronic anemia who presented to [facility] on 6/14 with reported SVT from EMS while undergoing dialysis.  1. Paroxysmal SVT: -Likely exacerbated by her acute on chronic anemia with hgb dipping down to 5.8, s/p 1 unit pRBC with improvement to 6.8 -Improved with last episode of SVT occuring this AM around 7:30, lasting a few seconds -Advised patient and nurse to ambulate today to assess what her heart rate does with some exertion to gauge medication titration  -Continue Lopressor 12.5 mg bid and amiodarone    2. Elevated troponin: -Mildly elevated at 0.15-->0.03, likely in the setting of supply demand ischemia 2/2 acute on chronic anemia with hgb of 5.8 and SVT with heart rate into the 130's  -  No anginal symptoms  -Would continue with prior plans for outpatient ischemic evaluation via nuclear stress testing   3. SOB: -Likely worsened by her acute on chronic anemia and volume overload/pulmonary edema seen on CXR -Volume management via HD   3. Acute on chronic anemia: -HGB down to 5.8 upon admission, s/p 1 unit pRBC with improvement to 6.8 in hgb -HGB pending this morning  -On Epogen -GI eval pending -Likely contributing to #1 and #3  4. ESRD on HD: -Pulmonary edema  -HD as tolerated per Renal  -She is due for HD today   5. HTN: -Lopressor added as above -Continue current meds  6. Cognitive impairment    Melvern Banker, PA-C Pager: [phone] 07/05/2014, 8:03 AM

## 2014-07-06 ENCOUNTER — Inpatient Hospital Stay: Payer: Medicare Other

## 2014-07-06 ENCOUNTER — Encounter: Payer: Self-pay | Admitting: Gastroenterology

## 2014-07-06 LAB — HEPATITIS B CORE ANTIBODY, TOTAL: Hep B Core Total Ab: NEGATIVE

## 2014-07-06 LAB — HEPATITIS B SURFACE ANTIGEN: Hepatitis B Surface Ag: NEGATIVE

## 2014-07-06 MED ORDER — VANCOMYCIN HCL IN DEXTROSE 1-5 GM/200ML-% IV SOLN
1000.0000 mg | INTRAVENOUS | Status: DC | PRN
  Filled 2014-07-06: qty 200

## 2014-07-06 MED ORDER — VANCOMYCIN HCL 10 G IV SOLR
1500.0000 mg | INTRAVENOUS | Status: AC
  Administered 2014-07-06: 1500 mg via INTRAVENOUS
  Filled 2014-07-06: qty 1500

## 2014-07-06 NOTE — Progress Notes (Addendum)
[facility] NAME: Kelsey Peterson    MR#:  [id number]  DATE OF BIRTH:  1955-01-31  SUBJECTIVE:  CHIEF COMPLAINT:   Chief Complaint  Patient presents with  . Flank Pain  now spiking fever upto 101.1, denies any other issues  REVIEW OF SYSTEMS:  Review of Systems  Constitutional: Positive for fever and malaise/fatigue. Negative for weight loss and diaphoresis.  HENT: Negative for ear discharge, ear pain, hearing loss, nosebleeds, sore throat and tinnitus.   Eyes: Negative for blurred vision and pain.  Respiratory: Positive for shortness of breath. Negative for cough, hemoptysis and wheezing.   Cardiovascular: Positive for palpitations. Negative for chest pain, orthopnea and leg swelling.  Gastrointestinal: Negative for heartburn, nausea, vomiting, abdominal pain, diarrhea, constipation and blood in stool.  Genitourinary: Negative for dysuria, urgency and frequency.  Musculoskeletal: Negative for myalgias and back pain.  Skin: Negative for itching and rash.  Neurological: Positive for weakness. Negative for dizziness, tingling, tremors, focal weakness, seizures and headaches.  Psychiatric/Behavioral: Negative for depression. The patient is not nervous/anxious.    DRUG ALLERGIES:   Allergies  Allergen Reactions  . Morphine And Related Hives  . Other Rash    Blood Pressure Pill (Pt doesn't remember name)    VITALS:  Blood pressure 134/78, pulse 72, temperature 98.9 F (37.2 C), temperature source Oral, resp. rate 23, weight 95.3 kg (210 lb 1.6 oz), SpO2 97 %. PHYSICAL EXAMINATION:  Physical Exam  Constitutional: She is oriented to person, place, and time and well-developed, well-nourished, and in no distress. She appears unhealthy. She has a sickly appearance.  HENT:  Head: Normocephalic and atraumatic.  Eyes: Conjunctivae and EOM are normal. Pupils are equal, round, and reactive to light.  Neck: Normal range of  motion. Neck supple. No tracheal deviation present. No thyromegaly present.  Cardiovascular: Normal rate, regular rhythm and normal heart sounds.   Right IJ dialysis catheter in place  Pulmonary/Chest: Effort normal and breath sounds normal. No respiratory distress. She has no wheezes. She exhibits no tenderness.  Abdominal: Soft. Bowel sounds are normal. She exhibits no distension. There is no tenderness.  Musculoskeletal: Normal range of motion.  Neurological: She is alert and oriented to person, place, and time. No cranial nerve deficit.  Skin: Skin is warm and dry. No rash noted.  Psychiatric: Mood normal. Her affect is blunt. She exhibits abnormal new learning ability. She has a flat affect.   LABORATORY PANEL:   CBC  Recent Labs Lab 07/04/14 0435  07/05/14 0118  WBC 8.2  --   --   HGB 5.8*  < > 7.8*  HCT 17.9*  --  23.9*  PLT 245  --   --   < > = values in this interval not displayed. ------------------------------------------------------------------------------------------------------------------ Chemistries   Recent Labs Lab 07/03/14 0933 07/03/14 0934  NA  --  137  K  --  3.4*  CL  --  96*  CO2  --  29  GLUCOSE  --  96  BUN  --  27*  CREATININE  --  6.57*  CALCIUM  --  7.5*  MG 1.8  --   AST  --  32  ALT  --  20  ALKPHOS  --  80  BILITOT  --  1.6*    ASSESSMENT AND PLAN:   * GPC bacteremia: with Fever spiking up to 101.1, blood c/s growing GPC and started on vanco & zosyn, dialysis cathetar could be  the source - get ID c/s, Obtain U/A and cxr  * Paroxysmal SVT: Likely due to severe anemia. continue metoprolol for rate control.  Cardiology following  * Pulmonary edema with Fluid overload & hypoxia: Likely due to inadequate dialysis, nephrology to dialyze her today  * Elevated troponin, possible due to demanding ischemia: Cardiology consultation.  She is not having any anginal symptoms,   * ESRD on hemodialysis: Nephrology planning for dialysis today    * Anemia of chronic kidney disease: Hemoglobin down to 5.8 on admission, received 2 unit of packed red blood cell transfusion with improvement of hemoglobin up to 7.8.  GI consultation appreciated - stool hemoccult and likely EGD on Monday if stool positive for occult blood.   All the records are reviewed and case discussed with Care Management/Social Workerr. Management plans discussed with the patient, family and they are in agreement.  CODE STATUS: Full Code   TOTAL TIME TAKING CARE OF THIS PATIENT: 35 minutes.   More than 50% of the time was spent in counseling/coordination of care: YES  POSSIBLE D/C IN 2-3 DAYS, DEPENDING ON CLINICAL CONDITION.   [facility], Bertha Lokken M.D on 07/06/2014 at 1:43 PM  Between 7am to 6pm - Pager - [phone]  After 6pm go to [email or web address] - password [email or web address] [facility]  [facility]  Office  [phone]  CC:  Primary care physician; No primary care provider on file.

## 2014-07-06 NOTE — Progress Notes (Signed)
Patient reported CAD per [place] (no prior caths), ESRD on HD, anemia of chronic disease, HTN, COPD, DM2, hepatitis C, migraine disorder, recently diagnosed reentrant atrial tachycardic rhythm/SVT this admission, and acute on chronic anemia. We are asked to clear her for EGD and colonoscopy on June 20.   She was recently consulted on by [facility] on 06/11/2014 for increased SOB in the setting of needing needing AV graft revision. She was noted to have been having increased SOB over the past 4 months that were concerning for increased congestion in the setting of her ESRD. It was unclear if her symptoms were related to cardiac etiology vs her underlying renal disease with poor fluid balance in the setting of inadequate HD. Her EKG at that time was not concerning MI or ischemia. She was advised to return around 6/1, though did not.   She was recently admitted to [facility] 6/2-6/3 after presenting to her dialysis center for her session that morning with 2 week history fatigue, nausea, intermittent vomiting, SOB, and chest pain. She was noted to already be below her dry weight with no fluid to remove at that time, thus she was sent to [facility] ED for further evaluation. Upon her arrival to [facility] ED she was noted to have mild intermittent chest pain that had resolved. She was found to have a troponin of 0.28-->0.28-->0.23-->0.24 in the setting of her ESRD. (Prior troponin back in December in the setting of her bacteremia of 0.08). CXR showed interstitial airspace disease, possible edema vs infection. Of note, upon me questioning her about her symptoms at that time she stated she did not want to talk about her chest pain anymore. Echo showed EF 60%, normal wall motion, GR1DD, mild AI, calcified mitral annulus, mild MR, no atrial septal defect or patent foramen ovale. She felt better in follow up on 6/3, without any further symptoms. It was felt she would need an ischemic outpatient evaluation given her  elevated troponin.   She presented to [facility] on 6/14 from her dialysis center after reportedly developed a tachycardiac heart rate, per EMS that was SVT. She was also noted to have a nonexertional left-sided to left flank pain with associated SOB. No associated nausea, vomiting, diaphoresis, presyncope, or syncope.   Upon her arrival to [facility] she was found to be in SVT with heart rate of 140. EKG showed SVT. CXR showed pulmonary edema with small bilateral pleural effusions left greater than right. Labs showed a hgb of 7.1-->5.8--> transfusion-->6.8-->7.8 on 6/16. Troponin 0.15-->0.03. K+ 3.4. Mg 1.8. Normal lipase. Her O2 sats were at 87%. She was given IV Lasix, nebs, oxygen, nitro, and IV diltiazem. She also received urgent HD. Heart rate did improve into the low 100's. Upon review of telemetry she would have brief episodes of sinus tachycardia in the low 100's followed by occasional episodes of brief SVT into the 130's. She reports she is feeling somewhat better this afternoon. She does tell me she has noted continued SOB. Telemetry shows heart rate into the 130's with SVT currently. Cardiology is consulted for the management of her SVT. She was started on Lopressor and amiodarone with improvement in her arrhythmia. She has remained somewhat volume overloaded, possibly 2/2 being unable to complete some of her HD sessions.   GI has seen the patient and found her to be guaiac positive and plans to proceed with EGD and colonoscopy. It is unclear why she is taking Plavix, there is no documentation in [place], or Care  Everywhere. Per cardiology note at [facility] she is on this for history of CAD without documented cath. Given her significant anemia and lack of documented cardiac cath it is reasonable to hold this at this time for the above GI procedures. Could also consider holding in the future given her severe anemia, though will leave to primary cardiologist at [facility]. Ok for EGD and colonoscopy at moderate cardiac  risk given her comorbidities.   Christell Faith, PA-C 07/06/2014 3:53 PM

## 2014-07-06 NOTE — Progress Notes (Signed)
DR Manuella Ghazi was informed of pt positive blood culture , no order given at this time

## 2014-07-06 NOTE — Progress Notes (Signed)
Subjective:   Patient known to our practice from outpatient She was admitted from dialysis for tachycardia and chest pressure.  Tolerated dialysis okay without SVT yesterday. This morning, overall feels fair.  MAXIMUM TEMPERATURE of 101.1 reported.  Objective:  Vital signs in last 24 hours:  Temp:  [97.5 F (36.4 C)-101.1 F (38.4 C)] 98.9 F (37.2 C) (06/17 0612) Pulse Rate:  [69-102] 72 (06/17 0612) Resp:  [20-40] 23 (06/17 0612) BP: (134-169)/(78-101) 134/78 mmHg (06/17 0612) SpO2:  [97 %-99 %] 97 % (06/17 0612) Weight:  [91 kg (200 lb 9.9 oz)-95.3 kg (210 lb 1.6 oz)] 95.3 kg (210 lb 1.6 oz) (06/17 0612)  Weight change: -0.081 kg (-2.9 oz) Filed Weights   07/05/14 1043 07/05/14 1412 07/06/14 0612  Weight: 95.9 kg (211 lb 6.7 oz) 91 kg (200 lb 9.9 oz) 95.3 kg (210 lb 1.6 oz)    Intake/Output: I/O last 3 completed shifts: In: 400 [P.O.:100; Blood:300] Out: 2600 [Urine:100; Other:2500]     Physical Exam: General: NAD, laying in bed  HEENT Anicteric, moist mucus membranes  Neck supple  Pulm/lungs Few scattered Crackles at bases b/l  CVS/Heart Irregular, no rub  Abdomen:  Soft, Non tender  Extremities: Trace edema  Neurologic: Non focal  Skin: No acute rashes  Access: Rt IJ PC       Basic Metabolic Panel:  Recent Labs Lab 07/03/14 0933 07/03/14 0934  NA  --  137  K  --  3.4*  CL  --  96*  CO2  --  29  GLUCOSE  --  96  BUN  --  27*  CREATININE  --  6.57*  CALCIUM  --  7.5*  MG 1.8  --      CBC:  Recent Labs Lab 07/03/14 0934 07/04/14 0435 07/04/14 1323 07/05/14 0118  WBC 10.9 8.2  --   --   NEUTROABS 9.4*  --   --   --   HGB 7.1* 5.8* 6.8* 7.8*  HCT 21.9* 17.9*  --  23.9*  MCV 77.3* 76.5*  --   --   PLT 302 245  --   --       Microbiology: Results for orders placed or performed during the hospital encounter of 07/03/14  Culture, blood (routine x 2)     Status: None (Preliminary result)   Collection Time: 07/05/14  9:00 PM  Result  Value Ref Range Status   Specimen Description BLOOD  Final   Special Requests NONE  Final   Culture NO GROWTH < 12 HOURS  Final   Report Status PENDING  Incomplete  Culture, blood (routine x 2)     Status: None (Preliminary result)   Collection Time: 07/05/14  9:00 PM  Result Value Ref Range Status   Specimen Description BLOOD  Final   Special Requests NONE  Final   Culture NO GROWTH < 12 HOURS  Final   Report Status PENDING  Incomplete    Coagulation Studies: No results for input(s): LABPROT, INR in the last 72 hours.  Urinalysis:  Recent Labs  07/04/14 2041  COLORURINE AMBER*  LABSPEC 1.014  PHURINE 9.0*  GLUCOSEU 50*  HGBUR 1+*  BILIRUBINUR NEGATIVE  KETONESUR NEGATIVE  PROTEINUR 100*  NITRITE NEGATIVE  LEUKOCYTESUR NEGATIVE      Imaging: No results found.   Medications:     . sodium chloride   Intravenous Once  . amiodarone  400 mg Oral BID  . amLODipine  5 mg Oral Daily  . b  complex-vitamin c-folic acid  1 tablet Oral Daily  . calcium carbonate  1 tablet Oral Daily  . cinacalcet  60 mg Oral Q breakfast  . clopidogrel  75 mg Oral Daily  . ferrous sulfate  325 mg Oral Daily  . furosemide  80 mg Oral Daily  . isosorbide mononitrate  30 mg Oral Daily  . metoprolol tartrate  12.5 mg Oral BID  . pantoprazole  40 mg Oral Daily  . piperacillin-tazobactam (ZOSYN)  IV  3.375 g Intravenous Q12H  . sevelamer carbonate  800 mg Oral QID  . 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  ESRD on HD TTHS:  -  patient tolerated dialysis well yesterday - Next hd treatment tomorrow, then Tuesday  2. Pulmonary edema - UF with HD as tolerated - 2500 cc removed yesterday  3  AOCKD:  continue epogen with HD.  status post transfusion 2/6, June 14 - patient not able to tolerate iv iron - continue EPO - GI eval ongoing  4.  SHPTH of renal origin: Continue sevelamer. - monitor phos    LOS: 3 Britanee Vanblarcom 6/17/20161:19 PM

## 2014-07-06 NOTE — Care Management (Signed)
Patient spiked temp over night.  Blood cultures are positive for gram positive cocci.  Discussed continued need for assessment for home 02 during progression.

## 2014-07-06 NOTE — Consult Note (Signed)
GI Inpatient Consult Note  Reason for Consult: Anemia   Attending Requesting Consult: Dr. Manuella Ghazi  History of Present Illness: Kelsey Peterson is a 59 y.o. female who presented to the ED after not feeling very well for the past 2 weeks.  She is unable to hear and can only read lips and that is still hard for her to do. I wrote down my questions for her to make sure she was understanding what I was asking.  I was able to determine that she has dialysis on Tues, Thurs and Saturdays.  She has never had a colonoscopy before.  She denies abdominal pain or any nausea or vomiting.  She does report that she has been having black and dark green stools.  She allowed me to perform a rectal exam which was stool guaiac positive.  Past Medical History:  Past Medical History  Diagnosis Date  . COPD (chronic obstructive pulmonary disease)   . ESRD on hemodialysis   . DM2 (diabetes mellitus, type 2)   . Hepatitis C   . Migraine with aura   . HOH (hard of hearing)   . Mitral regurgitation     a. echo 06/2014: EF 60%, no WMA, GR1DD, mild AI, calcified mitral annulus, mod MR, no atrial septal defect or patent foramen ovale   . Cognitive impairment     Problem List: Patient Active Problem List   Diagnosis Date Noted  . End stage renal disease on dialysis   . Supraventricular tachycardia   . NSVT (nonsustained ventricular tachycardia)   . Pulmonary edema 07/03/2014  . Anemia 07/03/2014  . Pain in the chest   . Diarrhea   . NSTEMI (non-ST elevated myocardial infarction) 06/21/2014    Past Surgical History: Past Surgical History  Procedure Laterality Date  . Dialysis fistula creation    . Tubal ligation      Allergies: Allergies  Allergen Reactions  . Morphine And Related Hives  . Other Rash    Blood Pressure Pill (Pt doesn't remember name)     Home Medications: Prescriptions prior to admission  Medication Sig Dispense Refill Last Dose  . albuterol (PROVENTIL HFA;VENTOLIN HFA) 108 (90 BASE)  MCG/ACT inhaler Inhale 2 puffs into the lungs every 4 (four) hours as needed for wheezing or shortness of breath.    07/02/2014 at Unknown time  . amLODipine (NORVASC) 5 MG tablet Take 5 mg by mouth daily.   07/02/2014 at Unknown time  . cinacalcet (SENSIPAR) 60 MG tablet Take 60 mg by mouth daily. With largest meal   07/02/2014 at Unknown time  . clopidogrel (PLAVIX) 75 MG tablet Take 75 mg by mouth daily.   07/02/2014 at Unknown time  . esomeprazole (NEXIUM) 40 MG capsule Take 40 mg by mouth daily.    07/02/2014 at Unknown time  . ferrous sulfate 325 (65 FE) MG tablet Take 325 mg by mouth daily.   07/02/2014 at Unknown time  . folic acid-vitamin b complex-vitamin c-selenium-zinc (DIALYVITE) 3 MG TABS tablet Take 1 tablet by mouth daily.   07/02/2014 at Unknown time  . furosemide (LASIX) 80 MG tablet Take 80 mg by mouth daily. Patient takes on non dialysis days.   07/02/2014 at Unknown time  . isosorbide mononitrate (IMDUR) 30 MG 24 hr tablet Take 30 mg by mouth daily.   07/02/2014 at Unknown time  . sevelamer carbonate (RENVELA) 800 MG tablet Take 800 mg by mouth 4 (four) times daily. Takes 3 times daily with meal and 1 time with  a snack twice daily.   07/02/2014 at Unknown time  . acetaminophen (TYLENOL) 325 MG tablet Take 650 mg by mouth every 6 (six) hours as needed for moderate pain or fever.   unknown at unknown  . calcium carbonate (TUMS - DOSED IN MG ELEMENTAL CALCIUM) 500 MG chewable tablet Chew 1 tablet by mouth daily.   unknown at unknown  . Lidocaine-Prilocaine, Bulk, 7.1-2.1 % CREA 1 application by Other route as needed (for tx). Apply to site 20-45 minutes before tx.   unknown at unknown  . traMADol (ULTRAM) 50 MG tablet Take 50 mg by mouth 2 (two) times daily as needed for moderate pain.   unknown at unknown   Home medication reconciliation was completed with the patient.   Scheduled Inpatient Medications:   . sodium chloride   Intravenous Once  . amiodarone  400 mg Oral BID  . amLODipine   5 mg Oral Daily  . b complex-vitamin c-folic acid  1 tablet Oral Daily  . calcium carbonate  1 tablet Oral Daily  . cinacalcet  60 mg Oral Q breakfast  . clopidogrel  75 mg Oral Daily  . ferrous sulfate  325 mg Oral Daily  . furosemide  80 mg Oral Daily  . isosorbide mononitrate  30 mg Oral Daily  . metoprolol tartrate  12.5 mg Oral BID  . pantoprazole  40 mg Oral Daily  . piperacillin-tazobactam (ZOSYN)  IV  3.375 g Intravenous Q12H  . sevelamer carbonate  800 mg Oral QID  . sodium chloride  3 mL Intravenous Q12H    Continuous Inpatient Infusions:     PRN Inpatient Medications:  sodium chloride, acetaminophen **OR** acetaminophen, albuterol, albuterol, hydrALAZINE, ondansetron **OR** ondansetron (ZOFRAN) IV, sodium chloride  Family History: family history includes Heart disease in her mother; Hypertension in her mother.   Social History:   reports that she has never smoked. She has never used smokeless tobacco. She reports that she does not drink alcohol or use illicit drugs.   Review of Systems: Constitutional: Weight is stable.  Eyes: No changes in vision. ENT: No oral lesions, sore throat.  GI: see HPI.  Heme/Lymph: No easy bruising.  CV: Positive for chest pain. GU: No hematuria.  Integumentary: No rashes.  Neuro: No headaches.  Psych: No depression/anxiety.  Endocrine: No heat/cold intolerance.  Allergic/Immunologic: No urticaria.  Resp: No cough, SOB.  Musculoskeletal: No joint swelling.    Physical Examination: BP 134/78 mmHg  Pulse 72  Temp(Src) 98.9 F (37.2 C) (Oral)  Resp 23  Wt 95.3 kg (210 lb 1.6 oz)  SpO2 97% Gen: NAD, alert and oriented x 4, patient cannot hear, history was difficult to obtain HEENT: PEERLA, EOMI, Neck: supple, no JVD or thyromegaly Chest: CTA bilaterally, no wheezes, crackles, or other adventitious sounds CV: Systolic murmur note, no rubs or gallops. Abd: soft, NT, ND, +BS in all four quadrants; no HSM, guarding, ridigity, or  rebound tenderness Ext: no edema, well perfused with 2+ pulses, Skin: no rash or lesions noted Lymph: no LAD Rectal exam: no tenderness noted, stool guaiac positive.   Data: Lab Results  Component Value Date   WBC 8.2 07/04/2014   HGB 7.8* 07/05/2014   HCT 23.9* 07/05/2014   MCV 76.5* 07/04/2014   PLT 245 07/04/2014    Recent Labs Lab 07/04/14 0435 07/04/14 1323 07/05/14 0118  HGB 5.8* 6.8* 7.8*   Lab Results  Component Value Date   NA 137 07/03/2014   K 3.4* 07/03/2014   CL  96* 07/03/2014   CO2 29 07/03/2014   BUN 27* 07/03/2014   CREATININE 6.57* 07/03/2014   Lab Results  Component Value Date   ALT 20 07/03/2014   AST 32 07/03/2014   ALKPHOS 80 07/03/2014   BILITOT 1.6* 07/03/2014   No results for input(s): APTT, INR, PTT in the last 168 hours.    Assessment/Plan: Ms. Jester is a 59 y.o. female presenting with anemia.  Recommendations: Stool guaiac positive x 1.  If two more positive results, plan on EGD/colonoscopy on Monday to determine anemia of chronic disease vs anemia from GI bleed.  Will need cardiology clearance and no dialysis that morning.  Dr. Candace Cruise will continue to follow over the weekend. Thank you for the consult. Please call with questions or concerns.  Salvadore Farber, PA-C  I personally performed the service.

## 2014-07-06 NOTE — Consult Note (Signed)
[facility]     Reason for Consult:Bactreremia    Referring Physician: Max Sane Date of Admission:  07/03/2014   Active Problems:   Pulmonary edema   Anemia   End stage renal disease on dialysis   Supraventricular tachycardia   NSVT (nonsustained ventricular tachycardia)   HPI: Kelsey Peterson is a 59 y.o. female with known history of COPD, diabetes, hepatitis, end-stage renal disease on hemodialysis admitted from HD 6/14 with tachycardia. Found in SVT and to have hgb quite low at 5.8 and was given a unit PRBC.  Had + troponins.   Initially was afebrile but then developed fevers and now bacteremic. Since admission has had 2/2 BCX + for GPC.  She had recent admission 6/2-6/3 with GE but this resolved.  Gets HD through a R chest wall HD line - this has not been tender or had any drainage. SHe has no skin lesons.   Past Medical History  Diagnosis Date  . COPD (chronic obstructive pulmonary disease)   . ESRD on hemodialysis   . DM2 (diabetes mellitus, type 2)   . Hepatitis C   . Migraine with aura   . HOH (hard of hearing)   . Mitral regurgitation     a. echo 06/2014: EF 60%, no WMA, GR1DD, mild AI, calcified mitral annulus, mod MR, no atrial septal defect or patent foramen ovale   . Cognitive impairment    Past Surgical History  Procedure Laterality Date  . Dialysis fistula creation    . Tubal ligation     History  Substance Use Topics  . Smoking status: Never Smoker   . Smokeless tobacco: Never Used  . Alcohol Use: No   Family History  Problem Relation Age of Onset  . Heart disease Mother   . Hypertension Mother     Allergies:  Allergies  Allergen Reactions  . Morphine And Related Hives  . Other Rash    Blood Pressure Pill (Pt doesn't remember name)     Current antibiotics: Antibiotics Given (last 72 hours)    Date/Time Action Medication Dose Rate   07/05/14 2142 Given   piperacillin-tazobactam (ZOSYN) IVPB 3.375 g 3.375 g 12.5 mL/hr   07/06/14 0833 Given   piperacillin-tazobactam (ZOSYN) IVPB 3.375 g 3.375 g 12.5 mL/hr      MEDICATIONS: . sodium chloride   Intravenous Once  . amiodarone  400 mg Oral BID  . amLODipine  5 mg Oral Daily  . b complex-vitamin c-folic acid  1 tablet Oral Daily  . calcium carbonate  1 tablet Oral Daily  . cinacalcet  60 mg Oral Q breakfast  . clopidogrel  75 mg Oral Daily  . ferrous sulfate  325 mg Oral Daily  . furosemide  80 mg Oral Daily  . isosorbide mononitrate  30 mg Oral Daily  . metoprolol tartrate  12.5 mg Oral BID  . pantoprazole  40 mg Oral Daily  . piperacillin-tazobactam (ZOSYN)  IV  3.375 g Intravenous Q12H  . sevelamer carbonate  800 mg Oral QID  . sodium chloride  3 mL Intravenous Q12H    Review of Systems - 11 systems reviewed and negative per HPI   OBJECTIVE: Temp:  [98.9 F (37.2 C)-101.1 F (38.4 C)] 98.9 F (37.2 C) (06/17 0612) Pulse Rate:  [72-102] 72 (06/17 0612) Resp:  [23-40] 23 (06/17 0612) BP: (134-169)/(78-101) 134/78 mmHg (06/17 0612) SpO2:  [97 %-98 %] 97 % (06/17 0612) Weight:  [95.3 kg (210 lb 1.6 oz)] 95.3 kg (  210 lb 1.6 oz) (06/17 0612) Physical Exam  Constitutional:  comunicates via lip reading HENT: [place]/AT, PERRLA, no scleral icterus some pallpr Mouth/Throat: Oropharynx is clear and moist. No oropharyngeal exudate. edentulous Cardiovascular: Normal rate, regular rhythm and normal heart sounds. 1/6 sm  Pulmonary/Chest:rhonchi bil Neck = supple, no nuchal rigidity Abdominal: Soft. Bowel sounds are normal.  exhibits no distension. There is no tenderness.  Lymphadenopathy: no cervical adenopathy. No axillary adenopathy Neurological: alert  interactive Skin: Skin is warm and dry. No rash noted. No erythema.  Psychiatric: a normal mood and affect.  behavior is normal.  Access R chest wall HD cath with no redness, tenderness or drainage  LABS: Results for orders placed or performed during the hospital encounter of 07/03/14 (from the past 48  hour(s))  Urinalysis complete, with microscopic     Status: Abnormal   Collection Time: 07/04/14  8:41 PM  Result Value Ref Range   Color, Urine AMBER (A) YELLOW   APPearance CLOUDY (A) CLEAR   Glucose, UA 50 (A) NEGATIVE mg/dL   Bilirubin Urine NEGATIVE NEGATIVE   Ketones, ur NEGATIVE NEGATIVE mg/dL   Specific Gravity, Urine 1.014 1.005 - 1.030   Hgb urine dipstick 1+ (A) NEGATIVE   pH 9.0 (H) 5.0 - 8.0   Protein, ur 100 (A) NEGATIVE mg/dL   Nitrite NEGATIVE NEGATIVE   Leukocytes, UA NEGATIVE NEGATIVE   RBC / HPF 6-30 0 - 5 RBC/hpf   WBC, UA 0-5 0 - 5 WBC/hpf   Bacteria, UA NONE SEEN NONE SEEN   Squamous Epithelial / LPF TOO NUMEROUS TO COUNT (A) NONE SEEN   Mucous PRESENT   Hemoglobin and hematocrit, blood     Status: Abnormal   Collection Time: 07/05/14  1:18 AM  Result Value Ref Range   Hemoglobin 7.8 (L) 12.0 - 16.0 g/dL   HCT 23.9 (L) 35.0 - 47.0 %  Culture, blood (routine x 2)     Status: None (Preliminary result)   Collection Time: 07/05/14  9:00 PM  Result Value Ref Range   Specimen Description BLOOD    Special Requests NONE    Culture  Setup Time      GRAM POSITIVE COCCI ANAEROBIC BOTTLE ONLY CRITICAL RESULT CALLED TO, READ BACK BY AND VERIFIED WITH: ABIGAIL JACKSON AT 1349 DV    Culture GRAM POSITIVE COCCI IDENTIFICATION TO FOLLOW     Report Status PENDING   Culture, blood (routine x 2)     Status: None (Preliminary result)   Collection Time: 07/05/14  9:00 PM  Result Value Ref Range   Specimen Description BLOOD    Special Requests NONE    Culture  Setup Time      GRAM POSITIVE COCCI ANAEROBIC BOTTLE ONLY CRITICAL VALUE NOTED.  VALUE IS CONSISTENT WITH PREVIOUSLY REPORTED AND CALLED VALUE.    Culture GRAM POSITIVE COCCI IDENTIFICATION TO FOLLOW     Report Status PENDING    No components found for: ESR, C REACTIVE PROTEIN MICRO: Recent Results (from the past 720 hour(s))  Culture, blood (routine x 2)     Status: None (Preliminary result)    Collection Time: 07/05/14  9:00 PM  Result Value Ref Range Status   Specimen Description BLOOD  Final   Special Requests NONE  Final   Culture  Setup Time   Final    GRAM POSITIVE COCCI ANAEROBIC BOTTLE ONLY CRITICAL RESULT CALLED TO, READ BACK BY AND VERIFIED WITH: ABIGAIL JACKSON AT 7209 DV    Culture GRAM POSITIVE COCCI  IDENTIFICATION TO FOLLOW   Final   Report Status PENDING  Incomplete  Culture, blood (routine x 2)     Status: None (Preliminary result)   Collection Time: 07/05/14  9:00 PM  Result Value Ref Range Status   Specimen Description BLOOD  Final   Special Requests NONE  Final   Culture  Setup Time   Final    GRAM POSITIVE COCCI ANAEROBIC BOTTLE ONLY CRITICAL VALUE NOTED.  VALUE IS CONSISTENT WITH PREVIOUSLY REPORTED AND CALLED VALUE.    Culture GRAM POSITIVE COCCI IDENTIFICATION TO FOLLOW   Final   Report Status PENDING  Incomplete    IMAGING: Dg Chest 1 View  07/03/2014   CLINICAL DATA:  Flank pain today.  Patient on dialysis.  EXAM: CHEST  1 VIEW  COMPARISON:  Single view of the chest 06/21/2014. PA and lateral chest 09/23/2013 and CT chest 0 9/ 06/2013.  FINDINGS: Right IJ approach dialysis catheter is again seen. There is marked enlargement of the cardiopericardial silhouette. Small pleural effusions are identified, greater on the left. There is interstitial pulmonary edema. Basilar airspace disease is likely due to atelectasis.  IMPRESSION: Interstitial pulmonary edema with small bilateral pleural effusions, larger on the left.  Marked enlargement of the cardiopericardial silhouette compatible with cardiomegaly and/or pericardial effusion.   Electronically Signed   By: Inge Rise M.D.   On: 07/03/2014 08:16   Dg Chest Portable 1 View  06/21/2014   CLINICAL DATA:  59 year old female with a history of renal failure. Presents to the ED with nausea vomiting and diarrhea for 2 weeks. Right-sided chest pain.  EXAM: PORTABLE CHEST - 1 VIEW  COMPARISON:  02/23/2014   FINDINGS: Cardiomediastinal silhouette persistently enlarged.  Fullness in the hilar vessels.  Persisting mixed interstitial and airspace disease at of the right base. Left base not well evaluated.  Interval placement of left IJ approach split tip hemodialysis catheter, appearing to terminate in the superior atrium.  Unremarkable skeletal structures.  IMPRESSION: Interstitial and airspace disease at the right base, potentially edema or infection. Left base not well evaluated.  Cardiomegaly.  Interval placement of split tip hemodialysis catheter from right IJ approach appearing to terminate in the superior atrium.  Signed,  Dulcy Fanny. Earleen Newport, DO  [facility]  [facility]   Electronically Signed   By: Corrie Mckusick D.O.   On: 06/21/2014 10:54    Assessment:   Kelsey Peterson is a 59 y.o. female known history of COPD, diabetes, hepatitis, end-stage renal disease on hemodialysis  CXR with pulm edema, Initially afebrile but developed fever and now with GPC bacteremia.  Recommendations Gram positive bacteremia- likely line infection.   Repeat BCX and check echo. If Staph aureus will need removal of HD line and replacement once cultures have cleared.  Vascular surgery consult if staph aurues Thank you very much for allowing me to participate in the care of this patient. Please call with questions.   Cheral Marker. Ola Spurr, MD

## 2014-07-06 NOTE — Progress Notes (Signed)
ANTIBIOTIC CONSULT NOTE - INITIAL  Pharmacy Consult for Vancomycin Indication: Sepsis  Allergies  Allergen Reactions  . Morphine And Related Hives  . Other Rash    Blood Pressure Pill (Pt doesn't remember name)     Patient Measurements: Weight: 210 lb 1.6 oz (95.3 kg) Adjusted Body Weight: 68.2 kg  Vital Signs: Temp: 97.5 F (36.4 C) (06/17 1938) BP: 128/74 mmHg (06/17 2102) Pulse Rate: 63 (06/17 2102) Intake/Output from previous day: 06/16 0701 - 06/17 0700 In: -  Out: 2500  Intake/Output from this shift:    Labs:  Recent Labs  07/04/14 0435 07/04/14 1323 07/05/14 0118  WBC 8.2  --   --   HGB 5.8* 6.8* 7.8*  PLT 245  --   --    Estimated Creatinine Clearance: 9.8 mL/min (by C-G formula based on Cr of 6.57). No results for input(s): VANCOTROUGH, VANCOPEAK, VANCORANDOM, GENTTROUGH, GENTPEAK, GENTRANDOM, TOBRATROUGH, TOBRAPEAK, TOBRARND, AMIKACINPEAK, AMIKACINTROU, AMIKACIN in the last 72 hours.   Microbiology: Recent Results (from the past 720 hour(s))  Culture, blood (routine x 2)     Status: None (Preliminary result)   Collection Time: 07/05/14  9:00 PM  Result Value Ref Range Status   Specimen Description BLOOD  Final   Special Requests NONE  Final   Culture  Setup Time   Final    GRAM POSITIVE COCCI IN BOTH AEROBIC AND ANAEROBIC BOTTLES CRITICAL RESULT CALLED TO, READ BACK BY AND VERIFIED WITH: ABIGAIL JACKSON AT 4403 DV CRITICAL VALUE NOTED.  VALUE IS CONSISTENT WITH PREVIOUSLY REPORTED AND CALLED VALUE. 07/06/14 AT 1600 BY JEF    Culture GRAM POSITIVE COCCI IDENTIFICATION TO FOLLOW   Final   Report Status PENDING  Incomplete  Culture, blood (routine x 2)     Status: None (Preliminary result)   Collection Time: 07/05/14  9:00 PM  Result Value Ref Range Status   Specimen Description BLOOD  Final   Special Requests NONE  Final   Culture  Setup Time   Final    GRAM POSITIVE COCCI IN BOTH AEROBIC AND ANAEROBIC BOTTLES CRITICAL VALUE NOTED.  VALUE IS  CONSISTENT WITH PREVIOUSLY REPORTED AND CALLED VALUE.    Culture GRAM POSITIVE COCCI IDENTIFICATION TO FOLLOW   Final   Report Status PENDING  Incomplete    Medical History: Past Medical History  Diagnosis Date  . COPD (chronic obstructive pulmonary disease)   . ESRD on hemodialysis   . DM2 (diabetes mellitus, type 2)   . Hepatitis C   . Migraine with aura   . HOH (hard of hearing)   . Mitral regurgitation     a. echo 06/2014: EF 60%, no WMA, GR1DD, mild AI, calcified mitral annulus, mod MR, no atrial septal defect or patent foramen ovale   . Cognitive impairment     Medications:  Prescriptions prior to admission  Medication Sig Dispense Refill Last Dose  . albuterol (PROVENTIL HFA;VENTOLIN HFA) 108 (90 BASE) MCG/ACT inhaler Inhale 2 puffs into the lungs every 4 (four) hours as needed for wheezing or shortness of breath.    07/02/2014 at Unknown time  . amLODipine (NORVASC) 5 MG tablet Take 5 mg by mouth daily.   07/02/2014 at Unknown time  . cinacalcet (SENSIPAR) 60 MG tablet Take 60 mg by mouth daily. With largest meal   07/02/2014 at Unknown time  . clopidogrel (PLAVIX) 75 MG tablet Take 75 mg by mouth daily.   07/02/2014 at Unknown time  . esomeprazole (NEXIUM) 40 MG capsule Take 40 mg  by mouth daily.    07/02/2014 at Unknown time  . ferrous sulfate 325 (65 FE) MG tablet Take 325 mg by mouth daily.   07/02/2014 at Unknown time  . folic acid-vitamin b complex-vitamin c-selenium-zinc (DIALYVITE) 3 MG TABS tablet Take 1 tablet by mouth daily.   07/02/2014 at Unknown time  . furosemide (LASIX) 80 MG tablet Take 80 mg by mouth daily. Patient takes on non dialysis days.   07/02/2014 at Unknown time  . isosorbide mononitrate (IMDUR) 30 MG 24 hr tablet Take 30 mg by mouth daily.   07/02/2014 at Unknown time  . sevelamer carbonate (RENVELA) 800 MG tablet Take 800 mg by mouth 4 (four) times daily. Takes 3 times daily with meal and 1 time with a snack twice daily.   07/02/2014 at Unknown time  .  acetaminophen (TYLENOL) 325 MG tablet Take 650 mg by mouth every 6 (six) hours as needed for moderate pain or fever.   unknown at unknown  . calcium carbonate (TUMS - DOSED IN MG ELEMENTAL CALCIUM) 500 MG chewable tablet Chew 1 tablet by mouth daily.   unknown at unknown  . Lidocaine-Prilocaine, Bulk, 6.5-7.8 % CREA 1 application by Other route as needed (for tx). Apply to site 20-45 minutes before tx.   unknown at unknown  . traMADol (ULTRAM) 50 MG tablet Take 50 mg by mouth 2 (two) times daily as needed for moderate pain.   unknown at unknown   Assessment: Pt on HD every Tues-Thurs-Sat Blood Cx positive for GPC  Goal of Therapy:  Vancomycin trough level 15-20 mcg/ml  Plan:  Expected duration 7 days with resolution of temperature and/or normalization of WBC  Will order Vancomycin 1500 mg IV X 1 loading dose for 6/17. Will order Vancomycin 1 gm IV to be given at the end of each HD session. Will draw 1st Vanc trough on 6/23 30 minutes prior to HD session.   Sigmond Patalano D 07/06/2014,10:21 PM

## 2014-07-06 NOTE — Consult Note (Signed)
  Pt seen and examined. Please see C. Celesta Aver, [place] notes. Poor historian. Main complaint now with fatigue and feeling weak. SOB though O2 sat 97% on 3 L. Does not know when she gets dialysis. No prior endoscopies. Will see if pt has anemia of chronic disease vs anemia from GI bleeding. Heme check stool x 3. If positive, then plan EGD/Colon on Monday if ok with cardiology and no dialysis that morning. Will follow over the weekend. Thanks

## 2014-07-07 ENCOUNTER — Encounter: Payer: Self-pay | Admitting: Gastroenterology

## 2014-07-07 DIAGNOSIS — J810 Acute pulmonary edema: Secondary | ICD-10-CM

## 2014-07-07 LAB — BASIC METABOLIC PANEL
Anion gap: 14 (ref 5–15)
BUN: 33 mg/dL — ABNORMAL HIGH (ref 6–20)
CALCIUM: 6.7 mg/dL — AB (ref 8.9–10.3)
CHLORIDE: 96 mmol/L — AB (ref 101–111)
CO2: 26 mmol/L (ref 22–32)
Creatinine, Ser: 6.54 mg/dL — ABNORMAL HIGH (ref 0.44–1.00)
GFR calc Af Amer: 7 mL/min — ABNORMAL LOW (ref >60)
GFR calc non Af Amer: 6 mL/min — ABNORMAL LOW (ref >60)
Glucose, Bld: 84 mg/dL (ref 65–99)
Potassium: 4.3 mmol/L (ref 3.5–5.1)
Sodium: 136 mmol/L (ref 135–145)

## 2014-07-07 LAB — CBC
HEMATOCRIT: 25.1 % — AB (ref 35.0–47.0)
HEMOGLOBIN: 8 g/dL — AB (ref 12.0–16.0)
MCH: 25 pg — ABNORMAL LOW (ref 26.0–34.0)
MCHC: 31.7 g/dL — ABNORMAL LOW (ref 32.0–36.0)
MCV: 78.7 fL — ABNORMAL LOW (ref 80.0–100.0)
Platelets: 212 10*3/uL (ref 150–440)
RBC: 3.19 MIL/uL — AB (ref 3.80–5.20)
RDW: 21.4 % — ABNORMAL HIGH (ref 11.5–14.5)
WBC: 15.5 10*3/uL — ABNORMAL HIGH (ref 3.6–11.0)

## 2014-07-07 MED ORDER — HEPARIN SODIUM (PORCINE) 1000 UNIT/ML IJ SOLN
4200.0000 [IU] | Freq: Once | INTRAMUSCULAR | Status: AC
  Administered 2014-07-07: 4200 [IU] via INTRAVENOUS

## 2014-07-07 MED ORDER — IBUPROFEN 600 MG PO TABS
600.0000 mg | ORAL_TABLET | ORAL | Status: DC | PRN
  Administered 2014-07-07: 600 mg via ORAL
  Filled 2014-07-07 (×2): qty 1

## 2014-07-07 MED ORDER — EPOETIN ALFA 10000 UNIT/ML IJ SOLN
10000.0000 [IU] | Freq: Once | INTRAMUSCULAR | Status: AC
  Administered 2014-07-07: 10000 [IU] via INTRAVENOUS

## 2014-07-07 NOTE — Progress Notes (Signed)
[place] at [place] NAME: Kelsey Peterson    MR#:  [id number]  DATE OF BIRTH:  1955/09/25  SUBJECTIVE:  CHIEF COMPLAINT:   Chief Complaint  Patient presents with  . Flank Pain   very sleepy and does not want to be disturbed as she could not sleep very well all night due to disturbance.  Fever curve improving on antibiotics  REVIEW OF SYSTEMS:  Review of Systems  Constitutional: Positive for fever and malaise/fatigue. Negative for weight loss and diaphoresis.  HENT: Negative for ear discharge, ear pain, hearing loss, nosebleeds, sore throat and tinnitus.   Eyes: Negative for blurred vision and pain.  Respiratory: Positive for shortness of breath. Negative for cough, hemoptysis and wheezing.   Cardiovascular: Positive for palpitations. Negative for chest pain, orthopnea and leg swelling.  Gastrointestinal: Negative for heartburn, nausea, vomiting, abdominal pain, diarrhea, constipation and blood in stool.  Genitourinary: Negative for dysuria, urgency and frequency.  Musculoskeletal: Negative for myalgias and back pain.  Skin: Negative for itching and rash.  Neurological: Positive for weakness. Negative for dizziness, tingling, tremors, focal weakness, seizures and headaches.  Psychiatric/Behavioral: Negative for depression. The patient is not nervous/anxious.    DRUG ALLERGIES:   Allergies  Allergen Reactions  . Morphine And Related Hives  . Other Rash    Blood Pressure Pill (Pt doesn't remember name)    VITALS:  Blood pressure 125/73, pulse 63, temperature 97.5 F (36.4 C), temperature source Oral, resp. rate 20, weight 92 kg (202 lb 13.2 oz), SpO2 100 %. PHYSICAL EXAMINATION:  Physical Exam  Constitutional: She is oriented to person, place, and time and well-developed, well-nourished, and in no distress. She appears unhealthy. She has a sickly appearance.  HENT:  Head: Normocephalic and atraumatic.  Eyes: Conjunctivae and  EOM are normal. Pupils are equal, round, and reactive to light.  Neck: Normal range of motion. Neck supple. No tracheal deviation present. No thyromegaly present.  Cardiovascular: Normal rate, regular rhythm and normal heart sounds.   Right IJ dialysis catheter in place  Pulmonary/Chest: Effort normal and breath sounds normal. No respiratory distress. She has no wheezes. She exhibits no tenderness.  Abdominal: Soft. Bowel sounds are normal. She exhibits no distension. There is no tenderness.  Musculoskeletal: Normal range of motion.  Neurological: She is alert and oriented to person, place, and time. No cranial nerve deficit.  Skin: Skin is warm and dry. No rash noted.  Psychiatric: Mood normal. Her affect is blunt. She exhibits abnormal new learning ability. She has a flat affect.   LABORATORY PANEL:   CBC  Recent Labs Lab 07/07/14 0407  WBC 15.5*  HGB 8.0*  HCT 25.1*  PLT 212   ------------------------------------------------------------------------------------------------------------------ Chemistries   Recent Labs Lab 07/03/14 0933  07/03/14 0934 07/07/14 0407  NA  --   < > 137 136  K  --   < > 3.4* 4.3  CL  --   < > 96* 96*  CO2  --   < > 29 26  GLUCOSE  --   < > 96 84  BUN  --   < > 27* 33*  CREATININE  --   < > 6.57* 6.54*  CALCIUM  --   < > 7.5* 6.7*  MG 1.8  --   --   --   AST  --   --  32  --   ALT  --   --  20  --   ALKPHOS  --   --  80  --   BILITOT  --   --  1.6*  --   < > = values in this interval not displayed.  ASSESSMENT AND PLAN:   * GPC bacteremia: with MAXIMUM TEMPERATURE up to 101.1, blood c/s growing GPC and on vanco & zosyn, dialysis cathetar could be the source - appreciate ID c/s, if culture comes back as Staphylococcus aureus.  Her dialysis catheter may be the source and will need to be removed.  Get 2-D echo.  She also has leukocytosis.   * Paroxysmal SVT: Likely due to severe anemia. continue metoprolol for rate control.  Cardiology  following and cleared for endoscopy if planned by GI  * Pulmonary edema with Fluid overload & hypoxia: On admission Likely due to inadequate dialysis,  Dialysis per nephrology   * Elevated troponin, possible due to demanding ischemia: Cardiology following. She is not having any anginal symptoms  * ESRD on hemodialysis: Nephrology managing dialysis  * Anemia of chronic kidney disease: Hemoglobin down to 5.8 on admission, received 2 unit of packed red blood cell transfusion with improvement of hemoglobin up to 7.8 -> 8.0.  GI consultation appreciated - likely EGD on Monday - stool positive for occult blood.  All the records are reviewed and case discussed with Care Management/Social Worker. Management plans discussed with the patient, family and they are in agreement.  CODE STATUS: Full Code   TOTAL TIME TAKING CARE OF THIS PATIENT: 35 minutes.   More than 50% of the time was spent in counseling/coordination of care: YES  POSSIBLE D/C IN 2-3 DAYS, DEPENDING ON CLINICAL CONDITION.   [facility], Adreana Coull M.D on 07/07/2014 at 8:11 AM  Between 7am to 6pm - Pager - [phone]  After 6pm go to [email or web address] - password [email or web address] [facility]  [place] [facility]  Office  [phone]  CC:  Primary care physician; No primary care provider on file.

## 2014-07-07 NOTE — Consult Note (Signed)
  GI Inpatient Follow-up Note  Patient Identification: Kelsey Peterson is a 59 y.o. female with severe anemia.   Subjective:C/O stomach hurting today. Stool heme positive by my PA. Appears less dyspneic today. Appreciates cardiology input.  Scheduled Inpatient Medications:  . sodium chloride   Intravenous Once  . amiodarone  400 mg Oral BID  . amLODipine  5 mg Oral Daily  . b complex-vitamin c-folic acid  1 tablet Oral Daily  . calcium carbonate  1 tablet Oral Daily  . cinacalcet  60 mg Oral Q breakfast  . clopidogrel  75 mg Oral Daily  . ferrous sulfate  325 mg Oral Daily  . furosemide  80 mg Oral Daily  . isosorbide mononitrate  30 mg Oral Daily  . metoprolol tartrate  12.5 mg Oral BID  . pantoprazole  40 mg Oral Daily  . piperacillin-tazobactam (ZOSYN)  IV  3.375 g Intravenous Q12H  . sevelamer carbonate  800 mg Oral QID  . sodium chloride  3 mL Intravenous Q12H    Continuous Inpatient Infusions:     PRN Inpatient Medications:  sodium chloride, acetaminophen **OR** acetaminophen, albuterol, albuterol, hydrALAZINE, ondansetron **OR** ondansetron (ZOFRAN) IV, sodium chloride, vancomycin  Review of Systems: Constitutional: Weight is stable.  Eyes: No changes in vision. ENT: No oral lesions, sore throat.  GI: see HPI.  Heme/Lymph: No easy bruising.  CV: No chest pain.  GU: No hematuria.  Integumentary: No rashes.  Neuro: No headaches.  Psych: No depression/anxiety.  Endocrine: No heat/cold intolerance.  Allergic/Immunologic: No urticaria.  Resp: No cough, SOB.  Musculoskeletal: No joint swelling.    Physical Examination: BP 125/73 mmHg  Pulse 63  Temp(Src) 97.5 F (36.4 C) (Oral)  Resp 20  Wt 92 kg (202 lb 13.2 oz)  SpO2 100% Gen: NAD, alert and oriented x 4 HEENT: PEERLA, EOMI, Neck: supple, no JVD or thyromegaly Chest: CTA bilaterally, no wheezes, crackles, or other adventitious sounds CV: RRR, no m/g/c/r Abd: soft, abdominal tenderness, ND, +BS in all  four quadrants; no HSM, guarding, ridigity, or rebound tenderness Ext: no edema, well perfused with 2+ pulses, Skin: no rash or lesions noted Lymph: no LAD  Data: Lab Results  Component Value Date   WBC 15.5* 07/07/2014   HGB 8.0* 07/07/2014   HCT 25.1* 07/07/2014   MCV 78.7* 07/07/2014   PLT 212 07/07/2014    Recent Labs Lab 07/04/14 1323 07/05/14 0118 07/07/14 0407  HGB 6.8* 7.8* 8.0*   Lab Results  Component Value Date   NA 136 07/07/2014   K 4.3 07/07/2014   CL 96* 07/07/2014   CO2 26 07/07/2014   BUN 33* 07/07/2014   CREATININE 6.54* 07/07/2014   Lab Results  Component Value Date   ALT 20 07/03/2014   AST 32 07/03/2014   ALKPHOS 80 07/03/2014   BILITOT 1.6* 07/03/2014   No results for input(s): APTT, INR, PTT in the last 168 hours. Assessment/Plan: Kelsey Peterson is a 59 y.o. female with anemia and heme positive stool.  Recommendations: Will switch to clear liquid diet tomorrow. Bowel prep tomorrow afternoon. EGD/colon on Monday. Thanks. Please call with questions or concerns.  Kendarrius Tanzi, Lupita Dawn, MD

## 2014-07-07 NOTE — Progress Notes (Signed)
Subjective:   Patient known to our practice from outpatient dialysis She was admitted from dialysis for tachycardia and chest pressure.  Currently seen during dialysis Vanc and Zosyn started GPC in blood  Objective:  Vital signs in last 24 hours:  Temp:  [97.5 F (36.4 C)-97.6 F (36.4 C)] 97.6 F (36.4 C) (06/18 1000) Pulse Rate:  [58-63] 61 (06/18 1100) Resp:  [18-20] 18 (06/18 1100) BP: (125-160)/(73-97) 160/97 mmHg (06/18 1030) SpO2:  [100 %] 100 % (06/18 1100) Weight:  [90.8 kg (200 lb 2.8 oz)-92 kg (202 lb 13.2 oz)] 90.8 kg (200 lb 2.8 oz) (06/18 0934)  Weight change: -3.9 kg (-8 lb 9.6 oz) Filed Weights   07/06/14 0612 07/07/14 0657 07/07/14 0934  Weight: 95.3 kg (210 lb 1.6 oz) 92 kg (202 lb 13.2 oz) 90.8 kg (200 lb 2.8 oz)    Intake/Output: I/O last 3 completed shifts: In: 600 [IV Piggyback:600] Out: -      Physical Exam: General: NAD, laying in bed  HEENT Anicteric, moist mucus membranes  Neck supple  Pulm/lungs Few scattered Crackles at bases b/l  CVS/Heart Irregular, no rub  Abdomen:  Soft, Non tender  Extremities: Trace edema  Neurologic: Non focal  Skin: No acute rashes  Access: Rt IJ PC       Basic Metabolic Panel:  Recent Labs Lab 07/03/14 0933 07/03/14 0934 07/07/14 0407  NA  --  137 136  K  --  3.4* 4.3  CL  --  96* 96*  CO2  --  29 26  GLUCOSE  --  96 84  BUN  --  27* 33*  CREATININE  --  6.57* 6.54*  CALCIUM  --  7.5* 6.7*  MG 1.8  --   --      CBC:  Recent Labs Lab 07/03/14 0934 07/04/14 0435 07/04/14 1323 07/05/14 0118 07/07/14 0407  WBC 10.9 8.2  --   --  15.5*  NEUTROABS 9.4*  --   --   --   --   HGB 7.1* 5.8* 6.8* 7.8* 8.0*  HCT 21.9* 17.9*  --  23.9* 25.1*  MCV 77.3* 76.5*  --   --  78.7*  PLT 302 245  --   --  212      Microbiology: Results for orders placed or performed during the hospital encounter of 07/03/14  Culture, blood (routine x 2)     Status: None (Preliminary result)   Collection Time:  07/05/14  9:00 PM  Result Value Ref Range Status   Specimen Description BLOOD  Final   Special Requests NONE  Final   Culture  Setup Time   Final    GRAM POSITIVE COCCI IN BOTH AEROBIC AND ANAEROBIC BOTTLES CRITICAL RESULT CALLED TO, READ BACK BY AND VERIFIED WITH: ABIGAIL JACKSON AT 1349 DV CRITICAL VALUE NOTED.  VALUE IS CONSISTENT WITH PREVIOUSLY REPORTED AND CALLED VALUE. 07/06/14 AT 1600 BY JEF    Culture GRAM POSITIVE COCCI IDENTIFICATION TO FOLLOW   Final   Report Status PENDING  Incomplete  Culture, blood (routine x 2)     Status: None (Preliminary result)   Collection Time: 07/05/14  9:00 PM  Result Value Ref Range Status   Specimen Description BLOOD  Final   Special Requests NONE  Final   Culture  Setup Time   Final    GRAM POSITIVE COCCI IN BOTH AEROBIC AND ANAEROBIC BOTTLES CRITICAL VALUE NOTED.  VALUE IS CONSISTENT WITH PREVIOUSLY REPORTED AND CALLED VALUE.    Culture  GRAM POSITIVE COCCI IDENTIFICATION TO FOLLOW   Final   Report Status PENDING  Incomplete  Culture, blood (routine x 2)     Status: None (Preliminary result)   Collection Time: 07/06/14  3:38 PM  Result Value Ref Range Status   Specimen Description BLOOD  Final   Special Requests Normal  Final   Culture NO GROWTH < 24 HOURS  Final   Report Status PENDING  Incomplete  Culture, blood (routine x 2)     Status: None (Preliminary result)   Collection Time: 07/06/14  4:29 PM  Result Value Ref Range Status   Specimen Description BLOOD  Final   Special Requests Normal  Final   Culture NO GROWTH < 24 HOURS  Final   Report Status PENDING  Incomplete    Coagulation Studies: No results for input(s): LABPROT, INR in the last 72 hours.  Urinalysis:  Recent Labs  07/04/14 2041  COLORURINE AMBER*  LABSPEC 1.014  PHURINE 9.0*  GLUCOSEU 50*  HGBUR 1+*  BILIRUBINUR NEGATIVE  KETONESUR NEGATIVE  PROTEINUR 100*  NITRITE NEGATIVE  LEUKOCYTESUR NEGATIVE      Imaging: Dg Chest 2 View  07/06/2014    CLINICAL DATA:  Fever  EXAM: CHEST  2 VIEW  COMPARISON:  07/03/2014  FINDINGS: Right IJ approach dialysis catheter in place with tips over the right atrium. Moderate enlargement of the cardiomediastinal silhouette is re-identified. Moderate left and small right pleural effusion are noted. Mild prominence of the interstitial markings may indicate interstitial edema. Left lung base is obscured without focal finding lateral projection.  IMPRESSION: Moderate left and trace right pleural effusions with possible early interstitial edema. No new focal abnormality.   Electronically Signed   By: Conchita Paris M.D.   On: 07/06/2014 15:12     Medications:     . sodium chloride   Intravenous Once  . amiodarone  400 mg Oral BID  . amLODipine  5 mg Oral Daily  . b complex-vitamin c-folic acid  1 tablet Oral Daily  . calcium carbonate  1 tablet Oral Daily  . cinacalcet  60 mg Oral Q breakfast  . clopidogrel  75 mg Oral Daily  . epoetin (EPOGEN/PROCRIT) injection  10,000 Units Intravenous Once  . ferrous sulfate  325 mg Oral Daily  . furosemide  80 mg Oral Daily  . isosorbide mononitrate  30 mg Oral Daily  . metoprolol tartrate  12.5 mg Oral BID  . pantoprazole  40 mg Oral Daily  . piperacillin-tazobactam (ZOSYN)  IV  3.375 g Intravenous Q12H  . sevelamer carbonate  800 mg Oral QID  . 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  ESRD on HD TTHS:  - Patient seen during dialysis Tolerating well  - HR staying in 60's during  treatment  2. Pulmonary edema - UF with HD as tolerated  3  AOCKD:  continue epogen with HD.  status post transfusion 2/6, June 14 - patient not able to tolerate iv iron - continue EPO - GI eval ongoing - EGD/Colon planned for monday  4.  SHPTH of renal origin: Continue  sevelamer. - monitor phos  5. Fever, GPC in blood - If staph, will need permcath removed - Vanc Zosyn currently    LOS: 4 Kelsey Peterson 6/18/201611:07 AM

## 2014-07-07 NOTE — Progress Notes (Signed)
Pt seen and examined.   Just finishing dialysis  Approx 3 L removed.   Note plans for EGD on Monday. No new recommendations.

## 2014-07-07 NOTE — Progress Notes (Signed)
Pt refused bed alarm. Pt educated on safety precautions and to call before getting up. Patient acknowledged and verbalized understanding.

## 2014-07-08 LAB — RENAL FUNCTION PANEL
ALBUMIN: 2.9 g/dL — AB (ref 3.5–5.0)
Anion gap: 11 (ref 5–15)
BUN: 20 mg/dL (ref 6–20)
CALCIUM: 6.9 mg/dL — AB (ref 8.9–10.3)
CHLORIDE: 97 mmol/L — AB (ref 101–111)
CO2: 30 mmol/L (ref 22–32)
Creatinine, Ser: 4.81 mg/dL — ABNORMAL HIGH (ref 0.44–1.00)
GFR calc non Af Amer: 9 mL/min — ABNORMAL LOW (ref >60)
GFR, EST AFRICAN AMERICAN: 10 mL/min — AB (ref >60)
Glucose, Bld: 93 mg/dL (ref 65–99)
Phosphorus: 2.6 mg/dL (ref 2.5–4.6)
Potassium: 4.4 mmol/L (ref 3.5–5.1)
Sodium: 138 mmol/L (ref 135–145)

## 2014-07-08 LAB — BASIC METABOLIC PANEL
Anion gap: 7 (ref 5–15)
BUN: 18 mg/dL (ref 6–20)
CO2: 32 mmol/L (ref 22–32)
Calcium: 6.9 mg/dL — ABNORMAL LOW (ref 8.9–10.3)
Chloride: 99 mmol/L — ABNORMAL LOW (ref 101–111)
Creatinine, Ser: 4.24 mg/dL — ABNORMAL HIGH (ref 0.44–1.00)
GFR calc Af Amer: 12 mL/min — ABNORMAL LOW (ref >60)
GFR calc non Af Amer: 10 mL/min — ABNORMAL LOW (ref >60)
GLUCOSE: 108 mg/dL — AB (ref 65–99)
POTASSIUM: 3.9 mmol/L (ref 3.5–5.1)
SODIUM: 138 mmol/L (ref 135–145)

## 2014-07-08 LAB — CBC
HCT: 26.5 % — ABNORMAL LOW (ref 35.0–47.0)
HEMATOCRIT: 24.1 % — AB (ref 35.0–47.0)
HEMOGLOBIN: 8.5 g/dL — AB (ref 12.0–16.0)
Hemoglobin: 7.9 g/dL — ABNORMAL LOW (ref 12.0–16.0)
MCH: 25.6 pg — ABNORMAL LOW (ref 26.0–34.0)
MCH: 25.6 pg — ABNORMAL LOW (ref 26.0–34.0)
MCHC: 32.6 g/dL (ref 32.0–36.0)
MCHC: 32.1 g/dL (ref 32.0–36.0)
MCV: 78.5 fL — ABNORMAL LOW (ref 80.0–100.0)
MCV: 79.6 fL — ABNORMAL LOW (ref 80.0–100.0)
PLATELETS: 251 10*3/uL (ref 150–440)
Platelets: 221 10*3/uL (ref 150–440)
RBC: 3.07 MIL/uL — AB (ref 3.80–5.20)
RBC: 3.33 MIL/uL — ABNORMAL LOW (ref 3.80–5.20)
RDW: 21.4 % — ABNORMAL HIGH (ref 11.5–14.5)
RDW: 21.4 % — ABNORMAL HIGH (ref 11.5–14.5)
WBC: 8.5 10*3/uL (ref 3.6–11.0)
WBC: 8 10*3/uL (ref 3.6–11.0)

## 2014-07-08 MED ORDER — BISACODYL 5 MG PO TBEC
10.0000 mg | DELAYED_RELEASE_TABLET | Freq: Once | ORAL | Status: DC
  Filled 2014-07-08: qty 2

## 2014-07-08 MED ORDER — PEG 3350-KCL-NA BICARB-NACL 420 G PO SOLR
4000.0000 mL | Freq: Once | ORAL | Status: AC
  Administered 2014-07-08: 4000 mL via ORAL
  Filled 2014-07-08: qty 4000

## 2014-07-08 MED ORDER — VANCOMYCIN HCL IN DEXTROSE 1-5 GM/200ML-% IV SOLN
1000.0000 mg | Freq: Once | INTRAVENOUS | Status: AC
  Administered 2014-07-08: 1000 mg via INTRAVENOUS
  Filled 2014-07-08: qty 200

## 2014-07-08 MED ORDER — SODIUM CHLORIDE 0.9 % IV SOLN
INTRAVENOUS | Status: DC
  Administered 2014-07-09 (×2): via INTRAVENOUS

## 2014-07-08 NOTE — Progress Notes (Signed)
ANTIBIOTIC CONSULT NOTE - FOLLOW UP  Pharmacy Consult for Vancomycin Indication: Sepsis  Allergies  Allergen Reactions  . Morphine And Related Hives  . Other Rash    Blood Pressure Pill (Pt doesn't remember name)     Patient Measurements: Height: 5\' 3"  (160 cm) Weight: 204 lb 12.9 oz (92.9 kg) IBW/kg (Calculated) : 52.4 Adjusted Body Weight: 68.2 kg  Vital Signs: Temp: 97.8 F (36.6 C) (06/19 1126) Temp Source: Oral (06/19 1126) BP: 147/84 mmHg (06/19 1126) Pulse Rate: 65 (06/19 1126) Intake/Output from previous day: 06/18 0701 - 06/19 0700 In: 240 [P.O.:240] Out: 0  Intake/Output from this shift:    Labs:  Recent Labs  07/07/14 0407 07/08/14 0417  WBC 15.5* 8.5  HGB 8.0* 7.9*  PLT 212 221  CREATININE 6.54* 4.24*   Estimated Creatinine Clearance: 15.3 mL/min (by C-G formula based on Cr of 4.24). No results for input(s): VANCOTROUGH, VANCOPEAK, VANCORANDOM, GENTTROUGH, GENTPEAK, GENTRANDOM, TOBRATROUGH, TOBRAPEAK, TOBRARND, AMIKACINPEAK, AMIKACINTROU, AMIKACIN in the last 72 hours.   Microbiology: Recent Results (from the past 720 hour(s))  Culture, blood (routine x 2)     Status: None (Preliminary result)   Collection Time: 07/05/14  9:00 PM  Result Value Ref Range Status   Specimen Description BLOOD  Final   Special Requests NONE  Final   Culture  Setup Time   Final    GRAM POSITIVE COCCI IN BOTH AEROBIC AND ANAEROBIC BOTTLES CRITICAL RESULT CALLED TO, READ BACK BY AND VERIFIED WITH: ABIGAIL JACKSON AT 6144 DV CRITICAL VALUE NOTED.  VALUE IS CONSISTENT WITH PREVIOUSLY REPORTED AND CALLED VALUE. 07/06/14 AT 1600 BY JEF    Culture GRAM POSITIVE COCCI IDENTIFICATION TO FOLLOW   Final   Report Status PENDING  Incomplete  Culture, blood (routine x 2)     Status: None (Preliminary result)   Collection Time: 07/05/14  9:00 PM  Result Value Ref Range Status   Specimen Description BLOOD  Final   Special Requests NONE  Final   Culture  Setup Time   Final   GRAM POSITIVE COCCI IN BOTH AEROBIC AND ANAEROBIC BOTTLES CRITICAL VALUE NOTED.  VALUE IS CONSISTENT WITH PREVIOUSLY REPORTED AND CALLED VALUE.    Culture GRAM POSITIVE COCCI IDENTIFICATION TO FOLLOW   Final   Report Status PENDING  Incomplete  Culture, blood (routine x 2)     Status: None (Preliminary result)   Collection Time: 07/06/14  3:38 PM  Result Value Ref Range Status   Specimen Description BLOOD  Final   Special Requests Normal  Final   Culture NO GROWTH 2 DAYS  Final   Report Status PENDING  Incomplete  Culture, blood (routine x 2)     Status: None (Preliminary result)   Collection Time: 07/06/14  4:29 PM  Result Value Ref Range Status   Specimen Description BLOOD  Final   Special Requests Normal  Final   Culture NO GROWTH 2 DAYS  Final   Report Status PENDING  Incomplete    Medical History: Past Medical History  Diagnosis Date  . COPD (chronic obstructive pulmonary disease)   . ESRD on hemodialysis   . DM2 (diabetes mellitus, type 2)   . Hepatitis C   . Migraine with aura   . HOH (hard of hearing)   . Mitral regurgitation     a. echo 06/2014: EF 60%, no WMA, GR1DD, mild AI, calcified mitral annulus, mod MR, no atrial septal defect or patent foramen ovale   . Cognitive impairment  Medications:  Prescriptions prior to admission  Medication Sig Dispense Refill Last Dose  . albuterol (PROVENTIL HFA;VENTOLIN HFA) 108 (90 BASE) MCG/ACT inhaler Inhale 2 puffs into the lungs every 4 (four) hours as needed for wheezing or shortness of breath.    07/02/2014 at Unknown time  . amLODipine (NORVASC) 5 MG tablet Take 5 mg by mouth daily.   07/02/2014 at Unknown time  . cinacalcet (SENSIPAR) 60 MG tablet Take 60 mg by mouth daily. With largest meal   07/02/2014 at Unknown time  . clopidogrel (PLAVIX) 75 MG tablet Take 75 mg by mouth daily.   07/02/2014 at Unknown time  . esomeprazole (NEXIUM) 40 MG capsule Take 40 mg by mouth daily.    07/02/2014 at Unknown time  . ferrous  sulfate 325 (65 FE) MG tablet Take 325 mg by mouth daily.   07/02/2014 at Unknown time  . folic acid-vitamin b complex-vitamin c-selenium-zinc (DIALYVITE) 3 MG TABS tablet Take 1 tablet by mouth daily.   07/02/2014 at Unknown time  . furosemide (LASIX) 80 MG tablet Take 80 mg by mouth daily. Patient takes on non dialysis days.   07/02/2014 at Unknown time  . isosorbide mononitrate (IMDUR) 30 MG 24 hr tablet Take 30 mg by mouth daily.   07/02/2014 at Unknown time  . sevelamer carbonate (RENVELA) 800 MG tablet Take 800 mg by mouth 4 (four) times daily. Takes 3 times daily with meal and 1 time with a snack twice daily.   07/02/2014 at Unknown time  . acetaminophen (TYLENOL) 325 MG tablet Take 650 mg by mouth every 6 (six) hours as needed for moderate pain or fever.   unknown at unknown  . calcium carbonate (TUMS - DOSED IN MG ELEMENTAL CALCIUM) 500 MG chewable tablet Chew 1 tablet by mouth daily.   unknown at unknown  . Lidocaine-Prilocaine, Bulk, 9.8-1.1 % CREA 1 application by Other route as needed (for tx). Apply to site 20-45 minutes before tx.   unknown at unknown  . traMADol (ULTRAM) 50 MG tablet Take 50 mg by mouth 2 (two) times daily as needed for moderate pain.   unknown at unknown   Assessment: Pt on HD every Tues-Thurs-Sat Blood Cx positive for GPC.  Wt: 92.9kg  Goal of Therapy:  Vancomycin trough level 15-20 mcg/ml  Plan:  Expected duration 7 days with resolution of temperature and/or normalization of WBC  Vancomycin 1500 mg IV X 1 loading dose given on 6/17. Maintenance dose: Vancomycin 1 gm IV to be given at the end of each HD session.  Pt received dialysis on Sat 6/18, vancomycin dose not ordered.  Will order 1g IV maintenance dose to be given today.  Per nephrology, next HD session will be on Tuesday 6/21.  Will draw 1st Vanc trough on 6/23 30 minutes prior to HD session.   Skip Mayer, PharmD  07/08/2014,12:09 PM

## 2014-07-08 NOTE — Progress Notes (Signed)
[facility] [place] NAME: Kelsey Peterson    MR#:  [id number]  DATE OF BIRTH:  January 06, 1956  SUBJECTIVE:  CHIEF COMPLAINT:   Chief Complaint  Patient presents with  . Flank Pain   - And is admitted from dialysis secondary to chest pressure and palpitations. Noted to have SVT and that is controlled now. -Also noted to have anemia that was proportionate to her palpitations. She did receive transfusion this admission. -For EGD and colonoscopy tomorrow. -Patient is very hard of hearing. REVIEW OF SYSTEMS:  Review of Systems  Constitutional: Negative for fever, weight loss, malaise/fatigue and diaphoresis.  HENT: Positive for hearing loss. Negative for ear discharge, ear pain, nosebleeds, sore throat and tinnitus.   Eyes: Negative for blurred vision and pain.  Respiratory: Positive for shortness of breath. Negative for cough, hemoptysis and wheezing.   Cardiovascular: Positive for palpitations. Negative for chest pain, orthopnea and leg swelling.  Gastrointestinal: Positive for abdominal pain. Negative for heartburn, nausea, vomiting, diarrhea, constipation and blood in stool.  Genitourinary: Negative for dysuria, urgency and frequency.  Musculoskeletal: Negative for myalgias and back pain.  Skin: Negative for itching and rash.  Neurological: Negative for dizziness, tingling, tremors, focal weakness, seizures, weakness and headaches.  Psychiatric/Behavioral: Negative for depression. The patient is not nervous/anxious.    DRUG ALLERGIES:   Allergies  Allergen Reactions  . Morphine And Related Hives  . Other Rash    Blood Pressure Pill (Pt doesn't remember name)    VITALS:  Blood pressure 147/84, pulse 65, temperature 97.8 F (36.6 C), temperature source Oral, resp. rate 20, height 5\' 3"  (1.6 m), weight 92.9 kg (204 lb 12.9 oz), SpO2 97 %. PHYSICAL EXAMINATION:  Physical Exam  Constitutional: She is oriented to person, place, and  time and well-developed, well-nourished, and in no distress.  HENT:  Head: Normocephalic and atraumatic.  Eyes: Conjunctivae and EOM are normal. Pupils are equal, round, and reactive to light.  Neck: Normal range of motion. Neck supple. No tracheal deviation present. No thyromegaly present.  Cardiovascular: Normal rate, regular rhythm and normal heart sounds.   Right IJ dialysis catheter in place  Pulmonary/Chest: Effort normal and breath sounds normal. No respiratory distress. She has no wheezes. She exhibits no tenderness.  Dictated to have grunting noises on breathing, but patient states she is comfortable and is at baseline. Decreased bibasilar breath sounds noted.  Abdominal: Soft. Bowel sounds are normal. She exhibits no distension. There is no tenderness.  Discomfort on palpation over the entire abdomen. No focal tenderness or guarding rigidity noted.  Musculoskeletal: Normal range of motion.  She does have a right arm fistula that seems like it has a thrill however patient says it's not working and they're in the process of taking it down.  Neurological: She is alert and oriented to person, place, and time. No cranial nerve deficit.  Skin: Skin is warm and dry. No rash noted.  Psychiatric: Mood normal. Her affect is blunt. She exhibits abnormal new learning ability. She has a flat affect.   LABORATORY PANEL:   CBC  Recent Labs Lab 07/08/14 0417  WBC 8.5  HGB 7.9*  HCT 24.1*  PLT 221   ------------------------------------------------------------------------------------------------------------------ Chemistries   Recent Labs Lab 07/03/14 0933 07/03/14 0934  07/08/14 0417  NA  --  137  < > 138  K  --  3.4*  < > 3.9  CL  --  96*  < > 99*  CO2  --  29  < > 32  GLUCOSE  --  96  < > 108*  BUN  --  27*  < > 18  CREATININE  --  6.57*  < > 4.24*  CALCIUM  --  7.5*  < > 6.9*  MG 1.8  --   --   --   AST  --  32  --   --   ALT  --  20  --   --   ALKPHOS  --  80  --   --    BILITOT  --  1.6*  --   --   < > = values in this interval not displayed.  ASSESSMENT AND PLAN:   * Sepsis-admitted with fever and 2 out of 4 blood cultures growing gram-positive cocci. -Continue vancomycin, can discontinue Zosyn. -No further fevers. If blood cultures come positive for staph aureus might need to take down that dialysis catheter.  -2-D echo is needed to rule out endocarditis..   * Paroxysmal SVT: Likely due to severe anemia. continue metoprolol for rate control.  - Cardiology following and cleared for endoscopy and colonoscopy  by GI - Heart rate is very well controlled at this time. Only on low-dose metoprolol.  * Pulmonary edema with Fluid overload & hypoxia: On admission Likely due to inadequate dialysis,  Dialysis per nephrology  -On Tuesday Thursday Saturday schedule. Had dialysis yesterday, neck status is scheduled for Tuesday.  * Elevated troponin, possible due to demanding ischemia: Cardiology following.  - No acute MI. Monitor at this time.  * ESRD on hemodialysis: Nephrology managing dialysis -Tuesday Thursday Saturday schedule. Next dialysis on Tuesday. Has a right chest permacath in place which might have to be taken down if the blood cultures grew staph aureus. Also has a right arm fistula but according to the patient that is not working.  * Anemia of chronic kidney disease: Hemoglobin down to 5.8 on admission, received 2 unit of packed red blood cell transfusion with improvement of hemoglobin up to 7.8 -> 8.0.  - GI consultation appreciated - likely EGD and colonoscopy tomorrow   - stool positive for occult blood. -Transfuse only if the hemoglobin is less than 7.  All the records are reviewed and case discussed with Care Management/Social Worker. Management plans discussed with the patient, family and they are in agreement.  CODE STATUS: Full Code   TOTAL TIME TAKING CARE OF THIS PATIENT: 36 minutes.   More than 50% of the time was spent in  counseling/coordination of care: YES  POSSIBLE D/C IN 2-3 DAYS, DEPENDING ON CLINICAL CONDITION.   Gladstone Lighter M.D on 07/08/2014 at 12:13 PM  Between 7am to 6pm - Pager - [phone]  After 6pm go to [email or web address] - password [email or web address] [facility]  [facility]  Office  [phone]  CC:  Primary care physician; No primary care provider on file.

## 2014-07-08 NOTE — Progress Notes (Signed)
Pt to have upper endo and colonoscopy tomorrow. Large tap water enema given, but pt unable to retain . She refused bisacodyl. golyte prep initiated mid afternoon. She is unwilling and unable to take full glass in less than 1/2 hr. She need lots of encouragement. Consent signed.

## 2014-07-08 NOTE — Consult Note (Signed)
  GI Inpatient Follow-up Note  Patient Identification: RAIVYN KABLER is a 59 y.o. female  Subjective: Still with some abdominal pain. Hgb low but stable. No active bleeding.  Scheduled Inpatient Medications:  . sodium chloride   Intravenous Once  . amiodarone  400 mg Oral BID  . amLODipine  5 mg Oral Daily  . b complex-vitamin c-folic acid  1 tablet Oral Daily  . bisacodyl  10 mg Oral Once  . calcium carbonate  1 tablet Oral Daily  . cinacalcet  60 mg Oral Q breakfast  . ferrous sulfate  325 mg Oral Daily  . furosemide  80 mg Oral Daily  . isosorbide mononitrate  30 mg Oral Daily  . metoprolol tartrate  12.5 mg Oral BID  . pantoprazole  40 mg Oral Daily  . piperacillin-tazobactam (ZOSYN)  IV  3.375 g Intravenous Q12H  . polyethylene glycol-electrolytes  4,000 mL Oral Once  . sevelamer carbonate  800 mg Oral QID  . sodium chloride  3 mL Intravenous Q12H    Continuous Inpatient Infusions:   . sodium chloride      PRN Inpatient Medications:  sodium chloride, acetaminophen **OR** acetaminophen, albuterol, albuterol, hydrALAZINE, ibuprofen, ondansetron **OR** ondansetron (ZOFRAN) IV, sodium chloride, vancomycin  Review of Systems: Constitutional: Weight is stable.  Eyes: No changes in vision. ENT: No oral lesions, sore throat.  GI: see HPI.  Heme/Lymph: No easy bruising.  CV: No chest pain.  GU: No hematuria.  Integumentary: No rashes.  Neuro: No headaches.  Psych: No depression/anxiety.  Endocrine: No heat/cold intolerance.  Allergic/Immunologic: No urticaria.  Resp: No cough, SOB.  Musculoskeletal: No joint swelling.    Physical Examination: BP 160/88 mmHg  Pulse 73  Temp(Src) 98 F (36.7 C) (Oral)  Resp 20  Ht 5\' 3"  (1.6 m)  Wt 92.9 kg (204 lb 12.9 oz)  BMI 36.29 kg/m2  SpO2 99% Gen: NAD, alert and oriented x 4 HEENT: PEERLA, EOMI, Neck: supple, no JVD or thyromegaly Chest: CTA bilaterally, no wheezes, crackles, or other adventitious sounds CV: RRR,  no m/g/c/r Abd: soft, NT, ND, +BS in all four quadrants; no HSM, guarding, ridigity, or rebound tenderness Ext: no edema, well perfused with 2+ pulses, Skin: no rash or lesions noted Lymph: no LAD  Data: Lab Results  Component Value Date   WBC 8.5 07/08/2014   HGB 7.9* 07/08/2014   HCT 24.1* 07/08/2014   MCV 78.5* 07/08/2014   PLT 221 07/08/2014    Recent Labs Lab 07/05/14 0118 07/07/14 0407 07/08/14 0417  HGB 7.8* 8.0* 7.9*   Lab Results  Component Value Date   NA 138 07/08/2014   K 3.9 07/08/2014   CL 99* 07/08/2014   CO2 32 07/08/2014   BUN 18 07/08/2014   CREATININE 4.24* 07/08/2014   Lab Results  Component Value Date   ALT 20 07/03/2014   AST 32 07/03/2014   ALKPHOS 80 07/03/2014   BILITOT 1.6* 07/03/2014   No results for input(s): APTT, INR, PTT in the last 168 hours. Assessment/Plan: Ms. Consalvo is a 59 y.o. female with anemia, heme positive stool, and abdominal pain.   Recommendations: Bowel prep today for EGD/colon tomorrow afternoon. Due to plavix use, plan diagnostic test, rather than therapeutic test tomorrow. Please call with questions or concerns.  Asencion Guisinger, Lupita Dawn, MD

## 2014-07-08 NOTE — Progress Notes (Signed)
Subjective:   Patient known to our practice from outpatient dialysis She was admitted from dialysis for tachycardia and chest pressure.  Tachycardia improved after starting beta blocker therapy Low hgb was noted therefore EGD and Colonoscopy are planned for tomorrow  Objective:  Vital signs in last 24 hours:  Temp:  [97.6 F (36.4 C)-98.3 F (36.8 C)] 97.9 F (36.6 C) (06/19 0521) Pulse Rate:  [57-67] 66 (06/19 0521) Resp:  [18-20] 20 (06/19 0521) BP: (133-162)/(76-97) 133/77 mmHg (06/19 0521) SpO2:  [97 %-100 %] 100 % (06/19 0521) Weight:  [90.4 kg (199 lb 4.7 oz)-92.9 kg (204 lb 12.9 oz)] 92.9 kg (204 lb 12.9 oz) (06/19 0600)  Weight change: -1.2 kg (-2 lb 10.3 oz) Filed Weights   07/07/14 0934 07/07/14 1431 07/08/14 0600  Weight: 90.8 kg (200 lb 2.8 oz) 90.4 kg (199 lb 4.7 oz) 92.9 kg (204 lb 12.9 oz)    Intake/Output: I/O last 3 completed shifts: In: 790 [P.O.:240; IV Piggyback:550] Out: 0      Physical Exam: General: NAD, laying in bed  HEENT Anicteric, moist mucus membranes  Neck supple  Pulm/lungs Few scattered Crackles at bases b/l  CVS/Heart Irregular, no rub  Abdomen:  Soft, Non tender  Extremities: Trace edema  Neurologic: Non focal  Skin: No acute rashes  Access: Rt IJ PC       Basic Metabolic Panel:  Recent Labs Lab 07/03/14 0933 07/03/14 0934 07/07/14 0407 07/08/14 0417  NA  --  137 136 138  K  --  3.4* 4.3 3.9  CL  --  96* 96* 99*  CO2  --  29 26 32  GLUCOSE  --  96 84 108*  BUN  --  27* 33* 18  CREATININE  --  6.57* 6.54* 4.24*  CALCIUM  --  7.5* 6.7* 6.9*  MG 1.8  --   --   --      CBC:  Recent Labs Lab 07/03/14 0934 07/04/14 0435 07/04/14 1323 07/05/14 0118 07/07/14 0407 07/08/14 0417  WBC 10.9 8.2  --   --  15.5* 8.5  NEUTROABS 9.4*  --   --   --   --   --   HGB 7.1* 5.8* 6.8* 7.8* 8.0* 7.9*  HCT 21.9* 17.9*  --  23.9* 25.1* 24.1*  MCV 77.3* 76.5*  --   --  78.7* 78.5*  PLT 302 245  --   --  212 221       Microbiology: Results for orders placed or performed during the hospital encounter of 07/03/14  Culture, blood (routine x 2)     Status: None (Preliminary result)   Collection Time: 07/05/14  9:00 PM  Result Value Ref Range Status   Specimen Description BLOOD  Final   Special Requests NONE  Final   Culture  Setup Time   Final    GRAM POSITIVE COCCI IN BOTH AEROBIC AND ANAEROBIC BOTTLES CRITICAL RESULT CALLED TO, READ BACK BY AND VERIFIED WITH: ABIGAIL JACKSON AT 8242 DV CRITICAL VALUE NOTED.  VALUE IS CONSISTENT WITH PREVIOUSLY REPORTED AND CALLED VALUE. 07/06/14 AT 1600 BY JEF    Culture GRAM POSITIVE COCCI IDENTIFICATION TO FOLLOW   Final   Report Status PENDING  Incomplete  Culture, blood (routine x 2)     Status: None (Preliminary result)   Collection Time: 07/05/14  9:00 PM  Result Value Ref Range Status   Specimen Description BLOOD  Final   Special Requests NONE  Final   Culture  Setup  Time   Final    GRAM POSITIVE COCCI IN BOTH AEROBIC AND ANAEROBIC BOTTLES CRITICAL VALUE NOTED.  VALUE IS CONSISTENT WITH PREVIOUSLY REPORTED AND CALLED VALUE.    Culture GRAM POSITIVE COCCI IDENTIFICATION TO FOLLOW   Final   Report Status PENDING  Incomplete  Culture, blood (routine x 2)     Status: None (Preliminary result)   Collection Time: 07/06/14  3:38 PM  Result Value Ref Range Status   Specimen Description BLOOD  Final   Special Requests Normal  Final   Culture NO GROWTH 2 DAYS  Final   Report Status PENDING  Incomplete  Culture, blood (routine x 2)     Status: None (Preliminary result)   Collection Time: 07/06/14  4:29 PM  Result Value Ref Range Status   Specimen Description BLOOD  Final   Special Requests Normal  Final   Culture NO GROWTH 2 DAYS  Final   Report Status PENDING  Incomplete    Coagulation Studies: No results for input(s): LABPROT, INR in the last 72 hours.  Urinalysis: No results for input(s): COLORURINE, LABSPEC, PHURINE, GLUCOSEU, HGBUR,  BILIRUBINUR, KETONESUR, PROTEINUR, UROBILINOGEN, NITRITE, LEUKOCYTESUR in the last 72 hours.  Invalid input(s): APPERANCEUR    Imaging: Dg Chest 2 View  07/06/2014   CLINICAL DATA:  Fever  EXAM: CHEST  2 VIEW  COMPARISON:  07/03/2014  FINDINGS: Right IJ approach dialysis catheter in place with tips over the right atrium. Moderate enlargement of the cardiomediastinal silhouette is re-identified. Moderate left and small right pleural effusion are noted. Mild prominence of the interstitial markings may indicate interstitial edema. Left lung base is obscured without focal finding lateral projection.  IMPRESSION: Moderate left and trace right pleural effusions with possible early interstitial edema. No new focal abnormality.   Electronically Signed   By: Conchita Paris M.D.   On: 07/06/2014 15:12     Medications:   . sodium chloride     . sodium chloride   Intravenous Once  . amiodarone  400 mg Oral BID  . amLODipine  5 mg Oral Daily  . b complex-vitamin c-folic acid  1 tablet Oral Daily  . bisacodyl  10 mg Oral Once  . calcium carbonate  1 tablet Oral Daily  . cinacalcet  60 mg Oral Q breakfast  . clopidogrel  75 mg Oral Daily  . ferrous sulfate  325 mg Oral Daily  . furosemide  80 mg Oral Daily  . isosorbide mononitrate  30 mg Oral Daily  . metoprolol tartrate  12.5 mg Oral BID  . pantoprazole  40 mg Oral Daily  . piperacillin-tazobactam (ZOSYN)  IV  3.375 g Intravenous Q12H  . polyethylene glycol-electrolytes  4,000 mL Oral Once  . sevelamer carbonate  800 mg Oral QID  . 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  ESRD on HD TTHS:  - HR staying in 60's now - tolerated dialysis well without problem - next HD on Tuesday  2. Pulmonary edema - UF with HD as tolerated  3  AOCKD:  continue epogen with HD.  status post  transfusion 2/6, June 14 - patient not able to tolerate iv iron - continue EPO - GI eval ongoing - EGD/Colon planned for monday  4.  SHPTH of renal origin: Continue sevelamer. - monitor phos  5. Fever, GPC in blood - If staph, will need permcath removed - Vanc Zosyn currently    LOS: 5 Donterrius Santucci 6/19/20169:42 AM

## 2014-07-09 ENCOUNTER — Encounter
Admission: EM | Disposition: A | Discharge status: Home / Self Care | Payer: Self-pay | Source: Home / Self Care | Attending: Internal Medicine

## 2014-07-09 ENCOUNTER — Inpatient Hospital Stay: Payer: Medicare Other | Admitting: Anesthesiology

## 2014-07-09 ENCOUNTER — Inpatient Hospital Stay
Admit: 2014-07-09 | Discharge: 2014-07-09 | Disposition: A | Discharge status: Home / Self Care | Payer: Medicare Other | Attending: Internal Medicine | Admitting: Internal Medicine

## 2014-07-09 HISTORY — PX: COLONOSCOPY WITH PROPOFOL: SHX5780

## 2014-07-09 LAB — BASIC METABOLIC PANEL
Anion gap: 9 (ref 5–15)
BUN: 21 mg/dL — ABNORMAL HIGH (ref 6–20)
CALCIUM: 6.4 mg/dL — AB (ref 8.9–10.3)
CO2: 28 mmol/L (ref 22–32)
CREATININE: 5.38 mg/dL — AB (ref 0.44–1.00)
Chloride: 98 mmol/L — ABNORMAL LOW (ref 101–111)
GFR, EST AFRICAN AMERICAN: 9 mL/min — AB (ref >60)
GFR, EST NON AFRICAN AMERICAN: 8 mL/min — AB (ref >60)
Glucose, Bld: 87 mg/dL (ref 65–99)
Potassium: 4 mmol/L (ref 3.5–5.1)
Sodium: 135 mmol/L (ref 135–145)

## 2014-07-09 LAB — CBC
HCT: 24.7 % — ABNORMAL LOW (ref 35.0–47.0)
Hemoglobin: 8 g/dL — ABNORMAL LOW (ref 12.0–16.0)
MCH: 25.7 pg — ABNORMAL LOW (ref 26.0–34.0)
MCHC: 32.4 g/dL (ref 32.0–36.0)
MCV: 79.2 fL — AB (ref 80.0–100.0)
PLATELETS: 241 10*3/uL (ref 150–440)
RBC: 3.12 MIL/uL — ABNORMAL LOW (ref 3.80–5.20)
RDW: 21.2 % — AB (ref 11.5–14.5)
WBC: 7.4 10*3/uL (ref 3.6–11.0)

## 2014-07-09 SURGERY — EGD (ESOPHAGOGASTRODUODENOSCOPY)
Anesthesia: General | Laterality: Left

## 2014-07-09 SURGERY — COLONOSCOPY WITH PROPOFOL
Anesthesia: General | Laterality: Left

## 2014-07-09 MED ORDER — PROPOFOL 10 MG/ML IV BOLUS
INTRAVENOUS | Status: DC | PRN
  Administered 2014-07-09: 40 mg via INTRAVENOUS

## 2014-07-09 MED ORDER — CEFTRIAXONE SODIUM IN DEXTROSE 20 MG/ML IV SOLN
1.0000 g | INTRAVENOUS | Status: DC
  Administered 2014-07-09: 1 g via INTRAVENOUS
  Filled 2014-07-09 (×2): qty 50

## 2014-07-09 MED ORDER — PROPOFOL INFUSION 10 MG/ML OPTIME
INTRAVENOUS | Status: DC | PRN
  Administered 2014-07-09: 120 ug/kg/min via INTRAVENOUS

## 2014-07-09 NOTE — Consult Note (Signed)
Date of Admission:  07/03/2014     ID: Kelsey Peterson is a 59 y.o. female with ESRD admitted with SVT, progressive anemia, Heme + stools, then fevers and found to have Alpha strep bacteremia  Active Problems:   Pulmonary edema   Anemia   End stage renal disease on dialysis   Supraventricular tachycardia   NSVT (nonsustained ventricular tachycardia)   Subjective: No further fevers, for colon egd   Medications:  . sodium chloride   Intravenous Once  . amiodarone  400 mg Oral BID  . amLODipine  5 mg Oral Daily  . b complex-vitamin c-folic acid  1 tablet Oral Daily  . bisacodyl  10 mg Oral Once  . calcium carbonate  1 tablet Oral Daily  . cinacalcet  60 mg Oral Q breakfast  . ferrous sulfate  325 mg Oral Daily  . furosemide  80 mg Oral Daily  . isosorbide mononitrate  30 mg Oral Daily  . metoprolol tartrate  12.5 mg Oral BID  . pantoprazole  40 mg Oral Daily  . sevelamer carbonate  800 mg Oral QID  . sodium chloride  3 mL Intravenous Q12H    Objective: Vital signs in last 24 hours: Temp:  [97.4 F (36.3 C)-97.9 F (36.6 C)] 97.6 F (36.4 C) (06/20 1326) Pulse Rate:  [62-72] 72 (06/20 1438) Resp:  [17-24] 17 (06/20 1326) BP: (138-163)/(71-89) 159/83 mmHg (06/20 1326) SpO2:  [97 %-100 %] 100 % (06/20 1326) Weight:  [89.903 kg (198 lb 3.2 oz)-90.719 kg (200 lb)] 90.719 kg (200 lb) (06/20 1326) Constitutional: comunicates via lip reading HENT: [place]/AT, PERRLA, no scleral icterus some pallpr Mouth/Throat: Oropharynx is clear and moist. No oropharyngeal exudate. edentulous Cardiovascular: Normal rate, regular rhythm and normal heart sounds. 1/6 sm  Pulmonary/Chest:rhonchi bil Neck = supple, no nuchal rigidity Abdominal: Soft. Bowel sounds are normal. exhibits no distension. There is no tenderness.  Lymphadenopathy: no cervical adenopathy. No axillary adenopathy Neurological: alert interactive Skin: Skin is warm and dry. No rash noted. No erythema.  Psychiatric: a  normal mood and affect. behavior is normal.  Access R chest wall HD cath with no redness, tenderness or drainage   Lab Results  Recent Labs  07/08/14 1651 07/09/14 0421  WBC 8.0 7.4  HGB 8.5* 8.0*  HCT 26.5* 24.7*  NA 138 135  K 4.4 4.0  CL 97* 98*  CO2 30 28  BUN 20 21*  CREATININE 4.81* 5.38*   Liver Panel  Recent Labs  07/08/14 1651  ALBUMIN 2.9*   Microbiology: Results for orders placed or performed during the hospital encounter of 07/03/14  Culture, blood (routine x 2)     Status: None (Preliminary result)   Collection Time: 07/05/14  9:00 PM  Result Value Ref Range Status   Specimen Description BLOOD  Final   Special Requests NONE  Final   Culture  Setup Time   Final    GRAM POSITIVE COCCI IN BOTH AEROBIC AND ANAEROBIC BOTTLES CRITICAL RESULT CALLED TO, READ BACK BY AND VERIFIED WITH: ABIGAIL JACKSON AT 1349 DV CRITICAL VALUE NOTED.  VALUE IS CONSISTENT WITH PREVIOUSLY REPORTED AND CALLED VALUE. 07/06/14 AT 1600 BY JEF    Culture GRAM POSITIVE COCCI IDENTIFICATION TO FOLLOW   Final   Report Status PENDING  Incomplete  Culture, blood (routine x 2)     Status: None   Collection Time: 07/05/14  9:00 PM  Result Value Ref Range Status   Specimen Description BLOOD  Final   Special Requests  NONE  Final   Culture  Setup Time   Final    GRAM POSITIVE COCCI IN BOTH AEROBIC AND ANAEROBIC BOTTLES CRITICAL VALUE NOTED.  VALUE IS CONSISTENT WITH PREVIOUSLY REPORTED AND CALLED VALUE.    Culture   Final    ALPHA HEMOLYTIC STREPTOCOCCUS SPECIES IN BOTH AEROBIC AND ANAEROBIC BOTTLES REFER TO H5O66 FOR SENSITIVITY RESULTS    Report Status 07/09/2014 FINAL  Final  Culture, blood (routine x 2)     Status: None (Preliminary result)   Collection Time: 07/06/14  3:38 PM  Result Value Ref Range Status   Specimen Description BLOOD  Final   Special Requests Normal  Final   Culture NO GROWTH 3 DAYS  Final   Report Status PENDING  Incomplete  Culture, blood (routine x 2)      Status: None (Preliminary result)   Collection Time: 07/06/14  4:29 PM  Result Value Ref Range Status   Specimen Description BLOOD  Final   Special Requests Normal  Final   Culture NO GROWTH 3 DAYS  Final   Report Status PENDING  Incomplete    Studies/Results: No results found.  Assessment/Plan: Kelsey Peterson is a 59 y.o. female known history of COPD, diabetes, hepatitis, end-stage renal disease on hemodialysis  CXR with pulm edema, Initially afebrile but developed fever and now with GPC bacteremia.  Cultures from blood growing alpha strep in 2 sets (Possible Strep Gallictus vs mutans)- sensitivites reveal  Levo, ceftriaxone and vanco. R to erythro mycin. Intermediate to pcn.  I am concerned for bowel lesions given growth of Group D streptococcus in blood. Also risk of endocarditis.   Recommendations Can change vanco to ceftriaxone for now - but for long term may be easier to give Vanco at HD. Was intermediate to PCN. Repeat BCX negative to date Echo pending to look for endocarditis. Colon and egd pending for tomorrow.  [place], [place]   07/09/2014, 2:38 PM

## 2014-07-09 NOTE — Care Management (Addendum)
REmains on IV antibiotics.  For EGD and colonoscopy today for abd pain. Continue to discuss during progression the need to see if patient is going to qualify for home 02.

## 2014-07-09 NOTE — Progress Notes (Signed)
SATURATION QUALIFICATIONS: (This note is used to comply with regulatory documentation for home oxygen)  Patient Saturations on Room Air at Rest =100%  Patient Saturations on Room Air while Ambulating = 97%  Patient Saturations on 0 Liters of oxygen while Ambulating = 0%

## 2014-07-09 NOTE — Anesthesia Postprocedure Evaluation (Signed)
  Anesthesia Post-op Note  Patient: Kelsey Peterson  Procedure(s) Performed: Procedure(s): COLONOSCOPY WITH PROPOFOL (Left)  Anesthesia type:General  Patient location: PACU  Post pain: Pain level controlled  Post assessment: Post-op Vital signs reviewed, Patient's Cardiovascular Status Stable, Respiratory Function Stable, Patent Airway and No signs of Nausea or vomiting  Post vital signs: Reviewed and stable  Last Vitals:  Filed Vitals:   07/09/14 1326  BP: 159/83  Pulse: 62  Temp: 36.4 C  Resp: 17    Level of consciousness: awake, alert  and patient cooperative  Complications: No apparent anesthesia complications

## 2014-07-09 NOTE — Anesthesia Preprocedure Evaluation (Signed)
Anesthesia Evaluation   Patient awake    Reviewed: Allergy & Precautions, NPO status , Patient's Chart, lab work & pertinent test results  History of Anesthesia Complications Negative for: history of anesthetic complications  Airway Mallampati: II       Dental  (+) Edentulous Lower, Edentulous Upper   Pulmonary COPD   + decreased breath sounds      Cardiovascular + Past MI and +CHF Rhythm:Irregular     Neuro/Psych  Headaches,    GI/Hepatic negative GI ROS, (+) Hepatitis -  Endo/Other  diabetes, Oral Hypoglycemic Agents  Renal/GU CRF and DialysisRenal disease  negative genitourinary   Musculoskeletal negative musculoskeletal ROS (+)   Abdominal (+) + obese,   Peds  Hematology  (+) anemia ,   Anesthesia Other Findings   Reproductive/Obstetrics negative OB ROS                             Anesthesia Physical Anesthesia Plan  ASA: III  Anesthesia Plan: General   Post-op Pain Management:    Induction: Intravenous  Airway Management Planned: Nasal Cannula  Additional Equipment:   Intra-op Plan:   Post-operative Plan:   Informed Consent: I have reviewed the patients History and Physical, chart, labs and discussed the procedure including the risks, benefits and alternatives for the proposed anesthesia with the patient or authorized representative who has indicated his/her understanding and acceptance.     Plan Discussed with: CRNA  Anesthesia Plan Comments:         Anesthesia Quick Evaluation

## 2014-07-09 NOTE — Progress Notes (Signed)
Subjective:  Patient sitting up in chair. Just took a bath. Had some shortness of breath last night.   Objective:  Vital signs in last 24 hours:  Temp:  [97.4 F (36.3 C)-97.9 F (36.6 C)] 97.9 F (36.6 C) (06/20 1133) Pulse Rate:  [63-68] 68 (06/20 1133) Resp:  [18] 18 (06/20 1133) BP: (138-163)/(71-89) 153/82 mmHg (06/20 1133) SpO2:  [97 %-100 %] 100 % (06/20 1133) Weight:  [89.903 kg (198 lb 3.2 oz)] 89.903 kg (198 lb 3.2 oz) (06/20 0512)  Weight change: -0.897 kg (-1 lb 15.6 oz) Filed Weights   07/07/14 1431 07/08/14 0600 07/09/14 0512  Weight: 90.4 kg (199 lb 4.7 oz) 92.9 kg (204 lb 12.9 oz) 89.903 kg (198 lb 3.2 oz)    Intake/Output:       Physical Exam: General: NAD  HEENT Anicteric, moist mucus membranes  Neck supple  Pulm/lungs CTAB normal effort  CVS/Heart Irregular, no rub  Abdomen:  Soft, Non tender, BS present  Extremities: Trace edema  Neurologic: Awake, alert, follows commands  Skin: No acute rashes  Access: Rt IJ PC       Basic Metabolic Panel:  Recent Labs Lab 07/03/14 0933  07/03/14 0934 07/07/14 0407 07/08/14 0417 07/08/14 1651 07/09/14 0421  NA  --   --  137 136 138 138 135  K  --   --  3.4* 4.3 3.9 4.4 4.0  CL  --   --  96* 96* 99* 97* 98*  CO2  --   --  29 26 32 30 28  GLUCOSE  --   --  96 84 108* 93 87  BUN  --   --  27* 33* 18 20 21*  CREATININE  --   --  6.57* 6.54* 4.24* 4.81* 5.38*  CALCIUM  --   < > 7.5* 6.7* 6.9* 6.9* 6.4*  MG 1.8  --   --   --   --   --   --   PHOS  --   --   --   --   --  2.6  --   < > = values in this interval not displayed.   CBC:  Recent Labs Lab 07/03/14 0934 07/04/14 0435  07/05/14 0118 07/07/14 0407 07/08/14 0417 07/08/14 1651 07/09/14 0421  WBC 10.9 8.2  --   --  15.5* 8.5 8.0 7.4  NEUTROABS 9.4*  --   --   --   --   --   --   --   HGB 7.1* 5.8*  < > 7.8* 8.0* 7.9* 8.5* 8.0*  HCT 21.9* 17.9*  --  23.9* 25.1* 24.1* 26.5* 24.7*  MCV 77.3* 76.5*  --   --  78.7* 78.5* 79.6* 79.2*   PLT 302 245  --   --  212 221 251 241  < > = values in this interval not displayed.    Microbiology: Results for orders placed or performed during the hospital encounter of 07/03/14  Culture, blood (routine x 2)     Status: None (Preliminary result)   Collection Time: 07/05/14  9:00 PM  Result Value Ref Range Status   Specimen Description BLOOD  Final   Special Requests NONE  Final   Culture  Setup Time   Final    GRAM POSITIVE COCCI IN BOTH AEROBIC AND ANAEROBIC BOTTLES CRITICAL RESULT CALLED TO, READ BACK BY AND VERIFIED WITH: ABIGAIL JACKSON AT 7342 DV CRITICAL VALUE NOTED.  VALUE IS CONSISTENT WITH PREVIOUSLY REPORTED AND CALLED VALUE.  07/06/14 AT 1600 BY JEF    Culture GRAM POSITIVE COCCI IDENTIFICATION TO FOLLOW   Final   Report Status PENDING  Incomplete  Culture, blood (routine x 2)     Status: None   Collection Time: 07/05/14  9:00 PM  Result Value Ref Range Status   Specimen Description BLOOD  Final   Special Requests NONE  Final   Culture  Setup Time   Final    GRAM POSITIVE COCCI IN BOTH AEROBIC AND ANAEROBIC BOTTLES CRITICAL VALUE NOTED.  VALUE IS CONSISTENT WITH PREVIOUSLY REPORTED AND CALLED VALUE.    Culture   Final    ALPHA HEMOLYTIC STREPTOCOCCUS SPECIES IN BOTH AEROBIC AND ANAEROBIC BOTTLES REFER TO H5O66 FOR SENSITIVITY RESULTS    Report Status 07/09/2014 FINAL  Final  Culture, blood (routine x 2)     Status: None (Preliminary result)   Collection Time: 07/06/14  3:38 PM  Result Value Ref Range Status   Specimen Description BLOOD  Final   Special Requests Normal  Final   Culture NO GROWTH 3 DAYS  Final   Report Status PENDING  Incomplete  Culture, blood (routine x 2)     Status: None (Preliminary result)   Collection Time: 07/06/14  4:29 PM  Result Value Ref Range Status   Specimen Description BLOOD  Final   Special Requests Normal  Final   Culture NO GROWTH 3 DAYS  Final   Report Status PENDING  Incomplete    Coagulation Studies: No results  for input(s): LABPROT, INR in the last 72 hours.  Urinalysis: No results for input(s): COLORURINE, LABSPEC, PHURINE, GLUCOSEU, HGBUR, BILIRUBINUR, KETONESUR, PROTEINUR, UROBILINOGEN, NITRITE, LEUKOCYTESUR in the last 72 hours.  Invalid input(s): APPERANCEUR    Imaging: No results found.   Medications:   . sodium chloride     . sodium chloride   Intravenous Once  . amiodarone  400 mg Oral BID  . amLODipine  5 mg Oral Daily  . b complex-vitamin c-folic acid  1 tablet Oral Daily  . bisacodyl  10 mg Oral Once  . calcium carbonate  1 tablet Oral Daily  . cinacalcet  60 mg Oral Q breakfast  . ferrous sulfate  325 mg Oral Daily  . furosemide  80 mg Oral Daily  . isosorbide mononitrate  30 mg Oral Daily  . metoprolol tartrate  12.5 mg Oral BID  . pantoprazole  40 mg Oral Daily  . sevelamer carbonate  800 mg Oral QID  . 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  ESRD on HD TTHS:  - No urgent indication for HD at present. Will plan for HD again tomorrow.  2. Pulmonary edema J81.0 - improved, did have some shortness of breath overnight, but breathing comfortably now, will plan for additional UF tomorrow.  3  AOCKD:   status post transfusion 2/6, June 14, can't tolerate IV iron. - hgb 8.0 today, will plan to continue epogen.    4.  SHPTH of renal origin: phos 2.6 at last check, continue renvela 800mg  po tid/wm.  5. Fever, GPC in blood - If  staph, will need permcath removed, however alpha strep was  demonstrated.      LOS: 6 Timon Geissinger 6/20/201611:56 AM

## 2014-07-09 NOTE — Care Management (Signed)
Patient 02 sat on exertion (walking around nursing station) on room air 100%.  Patient told CM she was "so short of breath."  Obtained order for overnight pulse oximetry tonight to determine if patient may require nocturnal 02.  Anticipate discharge within the next 24 hours.

## 2014-07-09 NOTE — Op Note (Signed)
[facility] Gastroenterology Patient Name: Kelsey Peterson Procedure Date: 07/09/2014 2:30 PM MRN: [id number] Account #: [id number] Date of Birth: 1955/11/13 Admit Type: Inpatient Age: 59 Room: [facility] ENDO ROOM 4 Gender: Female Note Status: Finalized Procedure:         Colonoscopy Indications:       Generalized abdominal pain, Heme positive stool, Iron                     deficiency anemia secondary to chronic blood loss Providers:         Lupita Dawn. Candace Cruise, MD Referring MD:      Forest Gleason Md, MD (Referring MD) Medicines:         Monitored Anesthesia Care Complications:     No immediate complications. Procedure:         Pre-Anesthesia Assessment:                    - Prior to the procedure, a History and Physical was                     performed, and patient medications, allergies and                     sensitivities were reviewed. The patient's tolerance of                     previous anesthesia was reviewed.                    - The risks and benefits of the procedure and the sedation                     options and risks were discussed with the patient. All                     questions were answered and informed consent was obtained.                    - After reviewing the risks and benefits, the patient was                     deemed in satisfactory condition to undergo the procedure.                    After obtaining informed consent, the colonoscope was                     passed under direct vision. Throughout the procedure, the                     patient's blood pressure, pulse, and oxygen saturations                     were monitored continuously. The Colonoscope was                     introduced through the anus and advanced to the the cecum,                     identified by appendiceal orifice and ileocecal valve. The                     colonoscopy was performed without difficulty. The patient  tolerated the procedure well. The  quality of the bowel                     preparation was good. Findings:      Multiple small and large-mouthed diverticula were found in the sigmoid       colon and in the descending colon.      Two sessile polyps were found in the cecum. The polyps were small in       size. These polyps were removed with a hot snare. Resection and       retrieval were complete.      Three sessile polyps were found in the ascending colon. The polyps were       small in size. These polyps were removed with a hot snare. Resection and       retrieval were complete.      The exam was otherwise without abnormality. Impression:        - Diverticulosis in the sigmoid colon and in the                     descending colon.                    - Two small polyps in the cecum. Resected and retrieved.                    - Three small polyps in the ascending colon. Resected and                     retrieved.                    - The examination was otherwise normal. Recommendation:    - Discharge patient to home.                    - Await pathology results.                    - The findings and recommendations were discussed with the                     patient.                    - Repeat colonoscopy in 3 years for surveillance based on                     pathology results.                    - Hold plavix 3 more days. 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 All rights reserved. The codes documented in this report are preliminary and upon coder review may  be revised to meet current compliance requirements. Hulen Luster, MD 07/09/2014 3:04:14 PM This report has been signed electronically. Number of Addenda: 0 Note Initiated On: 07/09/2014 2:30 PM Scope Withdrawal Time: 0 hours 8 minutes 49 seconds  Total Procedure Duration: 0 hours 15 minutes 6 seconds       [facility]

## 2014-07-09 NOTE — Progress Notes (Signed)
[place] at [place] NAME: Kelsey Peterson    MR#:  [id number]  DATE OF BIRTH:  [birth year]  SUBJECTIVE:  CHIEF COMPLAINT:   Chief Complaint  Patient presents with  . Flank Pain   - Hemoglobin is stable. For EGD and colonoscopy today. -For dialysis tomorrow. Complains of some shortness of breath last night, room air sats even on exertion were 100%. -Possible discharge tomorrow if stable.  REVIEW OF SYSTEMS:  Review of Systems  Constitutional: Negative for fever, weight loss, malaise/fatigue and diaphoresis.  HENT: Positive for hearing loss. Negative for ear discharge, ear pain, nosebleeds, sore throat and tinnitus.   Eyes: Negative for blurred vision and pain.  Respiratory: Positive for shortness of breath. Negative for cough, hemoptysis and wheezing.   Cardiovascular: Negative for chest pain, palpitations, orthopnea and leg swelling.  Gastrointestinal: Positive for abdominal pain. Negative for heartburn, nausea, vomiting, diarrhea, constipation and blood in stool.  Genitourinary: Negative for dysuria, urgency and frequency.  Musculoskeletal: Negative for myalgias and back pain.  Skin: Negative for itching and rash.  Neurological: Negative for dizziness, tingling, tremors, focal weakness, seizures, weakness and headaches.  Psychiatric/Behavioral: Negative for depression. The patient is not nervous/anxious.    DRUG ALLERGIES:   Allergies  Allergen Reactions  . Morphine And Related Hives  . Other Rash    Blood Pressure Pill (Pt doesn't remember name)    VITALS:  Blood pressure 153/82, pulse 68, temperature 97.9 F (36.6 C), temperature source Oral, resp. rate 24, height 5\' 3"  (1.6 m), weight 89.903 kg (198 lb 3.2 oz), SpO2 100 %. PHYSICAL EXAMINATION:  Physical Exam  Constitutional: She is oriented to person, place, and time and well-developed, well-nourished, and in no distress.  HENT:  Head: Normocephalic and atraumatic.   Eyes: Conjunctivae and EOM are normal. Pupils are equal, round, and reactive to light.  Neck: Normal range of motion. Neck supple. No tracheal deviation present. No thyromegaly present.  Cardiovascular: Normal rate, regular rhythm and normal heart sounds.   Right IJ dialysis catheter in place  Pulmonary/Chest: Effort normal and breath sounds normal. No respiratory distress. She has no wheezes. She exhibits no tenderness.  Dictated to have grunting noises on breathing, but patient states she is comfortable and is at baseline. Decreased bibasilar breath sounds noted.  Abdominal: Soft. Bowel sounds are normal. She exhibits no distension. There is no tenderness.  Discomfort on palpation over the entire abdomen. No focal tenderness or guarding rigidity noted.  Musculoskeletal: Normal range of motion.  She does have a right arm fistula that seems like it has a thrill however patient says it's not working and they're in the process of taking it down.  Neurological: She is alert and oriented to person, place, and time. No cranial nerve deficit.  Skin: Skin is warm and dry. No rash noted.  Psychiatric: Mood normal. She exhibits abnormal new learning ability. She has a flat affect.  Affect is flat.   LABORATORY PANEL:   CBC  Recent Labs Lab 07/09/14 0421  WBC 7.4  HGB 8.0*  HCT 24.7*  PLT 241   ------------------------------------------------------------------------------------------------------------------ Chemistries   Recent Labs Lab 07/03/14 0933 07/03/14 0934  07/09/14 0421  NA  --  137  < > 135  K  --  3.4*  < > 4.0  CL  --  96*  < > 98*  CO2  --  29  < > 28  GLUCOSE  --  96  < > 87  BUN  --  27*  < > 21*  CREATININE  --  6.57*  < > 5.38*  CALCIUM  --  7.5*  < > 6.4*  MG 1.8  --   --   --   AST  --  32  --   --   ALT  --  20  --   --   ALKPHOS  --  80  --   --   BILITOT  --  1.6*  --   --   < > = values in this interval not displayed.  ASSESSMENT AND PLAN:   *  Sepsis-admitted with fever and 2 out of 4 blood cultures growing gram-positive cocci. -Continue vancomycin, likely alpha hemolytic Streptococcus.  -In final sensitivities, if Streptococcus then can retain her dialysis permacath. Will need to be changed to oral antibiotics. -No further fevers. -2-D echo is needed to rule out endocarditis..  -Appreciate infectious disease consult  * Paroxysmal SVT: Likely due to severe anemia. continue metoprolol for rate control.  - Cardiology following and cleared for endoscopy and colonoscopy  by GI - Heart rate is very well controlled at this time. Only on low-dose metoprolol.  * Pulmonary edema with Fluid overload & hypoxia: On admission Likely due to inadequate dialysis,  Dialysis per nephrology  -On Tuesday Thursday Saturday schedule.  -Next dialysis tomorrow. Does say she is short of breath, sats are 100% even on exertion. -Chek overnight pulse ox and see if she would qualify for nocturnal home oxygen.  * Elevated troponin, possible due to demanding ischemia: Cardiology following.  - No acute MI. Monitor at this time.  * ESRD on hemodialysis: Nephrology managing dialysis -Tuesday Thursday Saturday schedule. Next dialysis on Tuesday. Has a right chest permacath . Also has a right arm fistula but according to the patient that is not working.  * Anemia of chronic kidney disease: Hemoglobin down to 5.8 on admission, received 2 unit of packed red blood cell transfusion with improvement of hemoglobin up to 7.8 -> 8.0.  - GI consultation appreciated - likely EGD and colonoscopy today  - stool positive for occult blood. -Transfuse only if the hemoglobin is less than 7.  All the records are reviewed and case discussed with Care Management/Social Worker. Management plans discussed with the patient, family and they are in agreement.  CODE STATUS: Full Code   TOTAL TIME TAKING CARE OF THIS PATIENT: 36 minutes.   More than 50% of the time was spent in  counseling/coordination of care: YES  POSSIBLE D/C TOMORROW, DEPENDING ON CLINICAL CONDITION.   Ewen Varnell M.D on 07/09/2014 at 1:12 PM  Between 7am to 6pm - Pager - [phone]  After 6pm go to [email or web address] - password [email or web address] [facility]  [place] [facility]  Office  [phone]  CC:  Primary care physician; No primary care provider on file.

## 2014-07-09 NOTE — Progress Notes (Signed)
*  PRELIMINARY RESULTS* Echocardiogram 2D Echocardiogram has been performed.  Kelsey Peterson 07/09/2014, 8:38 AM

## 2014-07-09 NOTE — Op Note (Signed)
EGD was completely normal. Colonoscopy showed external hemorrhoids, multiple tics in left side of colon, and 5 small polyps, mainly on right side of colon. Initially, I planned to do diagnostic colon due to recent plavix. However, when there was no bleeding after 1st polypectomy, I decided to remove all 5 polyps. Can resume renal diet. Hold plavix for at least 3 more days. Would repeat colon in 3 years. Will sign off. Thanks.

## 2014-07-09 NOTE — Transfer of Care (Signed)
Immediate Anesthesia Transfer of Care Note  Patient: Kelsey Peterson  Procedure(s) Performed: Procedure(s): COLONOSCOPY WITH PROPOFOL (Left)  Patient Location: PACU  Anesthesia Type:General  Level of Consciousness: sedated  Airway & Oxygen Therapy: Patient connected to nasal cannula oxygen  Post-op Assessment: Report given to RN  Post vital signs: Reviewed and stable  Last Vitals:  Filed Vitals:   07/09/14 1326  BP: 159/83  Pulse: 62  Temp: 36.4 C  Resp: 17    Complications: No apparent anesthesia complications

## 2014-07-10 LAB — BASIC METABOLIC PANEL
Anion gap: 13 (ref 5–15)
BUN: 27 mg/dL — ABNORMAL HIGH (ref 6–20)
CALCIUM: 6.6 mg/dL — AB (ref 8.9–10.3)
CO2: 28 mmol/L (ref 22–32)
Chloride: 95 mmol/L — ABNORMAL LOW (ref 101–111)
Creatinine, Ser: 6.59 mg/dL — ABNORMAL HIGH (ref 0.44–1.00)
GFR calc Af Amer: 7 mL/min — ABNORMAL LOW (ref >60)
GFR, EST NON AFRICAN AMERICAN: 6 mL/min — AB (ref >60)
GLUCOSE: 84 mg/dL (ref 65–99)
Potassium: 3.9 mmol/L (ref 3.5–5.1)
Sodium: 136 mmol/L (ref 135–145)

## 2014-07-10 LAB — CBC
HCT: 25.4 % — ABNORMAL LOW (ref 35.0–47.0)
HEMOGLOBIN: 8.4 g/dL — AB (ref 12.0–16.0)
MCH: 26.2 pg (ref 26.0–34.0)
MCHC: 33.2 g/dL (ref 32.0–36.0)
MCV: 78.8 fL — AB (ref 80.0–100.0)
Platelets: 249 10*3/uL (ref 150–440)
RBC: 3.23 MIL/uL — ABNORMAL LOW (ref 3.80–5.20)
RDW: 21.6 % — ABNORMAL HIGH (ref 11.5–14.5)
WBC: 7.8 10*3/uL (ref 3.6–11.0)

## 2014-07-10 MED ORDER — AMIODARONE HCL 200 MG PO TABS: 200.0000 mg | ORAL_TABLET | Freq: Two times a day (BID) | ORAL | Status: DC

## 2014-07-10 MED ORDER — CALCIUM ACETATE (PHOS BINDER) 667 MG PO CAPS: 667.0000 mg | ORAL_CAPSULE | Freq: Three times a day (TID) | ORAL | Status: DC

## 2014-07-10 MED ORDER — METOPROLOL TARTRATE 25 MG PO TABS: 12.5000 mg | ORAL_TABLET | Freq: Two times a day (BID) | ORAL | Status: DC

## 2014-07-10 MED ORDER — VANCOMYCIN HCL 1000 MG IV SOLR
1000.0000 mg | INTRAVENOUS | Status: DC
Start: 2014-07-10 — End: 2014-07-10

## 2014-07-10 MED ORDER — VANCOMYCIN HCL 1000 MG IV SOLR
1000.0000 mg | INTRAVENOUS | Status: AC
Start: 2014-07-10 — End: 2014-07-20

## 2014-07-10 MED ORDER — HEPARIN SODIUM (PORCINE) 1000 UNIT/ML IJ SOLN
4200.0000 [IU] | Freq: Once | INTRAMUSCULAR | Status: AC
  Administered 2014-07-10: 4200 [IU] via INTRAVENOUS
  Filled 2014-07-10: qty 4.2

## 2014-07-10 NOTE — Progress Notes (Signed)
Dr. Tressia Miners notified that patient is back from dialysis. Notified of blood pressure trends. MD aware of noncompliance by patient at home. Patient educated on diagnosis, medications, diet, etc. Dr. Tressia Miners spoke with patient's son regarding discharge - he should be here around 3:30 to pick up patient.  Kelsey Peterson

## 2014-07-10 NOTE — Discharge Summary (Signed)
[place] at [place] NAME: Kelsey Peterson    MR#:  [id number]  DATE OF BIRTH:  02/07/1955  DATE OF ADMISSION:  07/03/2014 ADMITTING PHYSICIAN: Demetrios Loll, MD  DATE OF DISCHARGE: 07/10/14  PRIMARY CARE PHYSICIAN: No primary care provider on file.    ADMISSION DIAGNOSIS:  Supraventricular tachycardia [I47.1] Acute pulmonary edema [J81.0] End stage renal disease on dialysis [N18.6, Z99.2] Other hypervolemia [E87.79]  DISCHARGE DIAGNOSIS:  Active Problems:   Pulmonary edema   Anemia   End stage renal disease on dialysis   Supraventricular tachycardia   NSVT (nonsustained ventricular tachycardia)   SECONDARY DIAGNOSIS:   Past Medical History  Diagnosis Date  . COPD (chronic obstructive pulmonary disease)   . ESRD on hemodialysis   . DM2 (diabetes mellitus, type 2)   . Hepatitis C   . Migraine with aura   . HOH (hard of hearing)   . Mitral regurgitation     a. echo 06/2014: EF 60%, no WMA, GR1DD, mild AI, calcified mitral annulus, mod MR, no atrial septal defect or patent foramen ovale   . Cognitive impairment     HOSPITAL COURSE:   * Sepsis-admitted with fever and 2 out of 4 blood cultures growing alpha hemolytic strep. -Continue vancomycin, as it can be given easily with dialysis. -Last negative blood cultures are from 07/06/2014 so will need 2 weeks of antibiotics treatment since then which will be 07/20/2014.  -No further fevers. Appreciate ID consult. -No need to change the permacath since it's not Staphylococcus. -2-D echo with no evidence of endocarditis or vegetations..   * Paroxysmal SVT: Likely due to severe anemia. continue metoprolol for rate control.  - Cardiology following. - on amiodarone 400 mg bid here- change to 200 mg bid at discharge and then qdaily as outpatient. - Heart rate is very well controlled at this time. Only on low-dose metoprolol.  * Pulmonary edema with Fluid overload & hypoxia: On  admission Likely due to inadequate dialysis, Dialysis per nephrology  -On Tuesday Thursday Saturday schedule.  -Next dialysis today. Does say she is short of breath, sats are 100% even on exertion. -Qualified for nocturnal home oxygen. Likely has underlying diastolic CHF.  * Elevated troponin, possible due to demanding ischemia: Cardiology following.  - No acute MI. Monitor at this time.  * ESRD on hemodialysis: Nephrology managing dialysis -Tuesday Thursday Saturday schedule. Next dialysis today. Has a right chest permacath . Also has a right arm fistula but according to the patient that is not working.  * Anemia of chronic kidney disease: Hemoglobin down to 5.8 on admission, received 2 unit of packed red blood cell transfusion with improvement of hemoglobin up to 7.8 -> 8.0.  - GI consultation appreciated - -hemoglobin stable. Cont iron pills. - EGD normal, colonoscopy with polyps and no malignancy  DISCHARGE CONDITIONS:   Guarded  CONSULTS OBTAINED:  Treatment Team:  Murlean Iba, MD Wellington Hampshire, MD Adrian Prows, MD GI consult by Dr. Candace Cruise DRUG ALLERGIES:   Allergies  Allergen Reactions  . Contrast Media [Iodinated Diagnostic Agents]   . Morphine And Related Hives  . Other Rash    Blood Pressure Pill (Pt doesn't remember name)     DISCHARGE MEDICATIONS:   Current Discharge Medication List    START taking these medications   Details  amiodarone (PACERONE) 200 MG tablet Take 1 tablet (200 mg total) by mouth 2 (two) times daily. Qty: 60 tablet, Refills: 1  calcium acetate (PHOSLO) 667 MG capsule Take 1 capsule (667 mg total) by mouth 3 (three) times daily with meals. Qty: 90 capsule, Refills: 1    metoprolol tartrate (LOPRESSOR) 25 MG tablet Take 0.5 tablets (12.5 mg total) by mouth 2 (two) times daily. Qty: 30 tablet, Refills: 1    vancomycin 1,000 mg in sodium chloride 0.9 % 250 mL Inject 1,000 mg into the vein Every Tuesday,Thursday,and Saturday  with dialysis. Qty: 5000 mg, Refills: 0      CONTINUE these medications which have NOT CHANGED   Details  albuterol (PROVENTIL HFA;VENTOLIN HFA) 108 (90 BASE) MCG/ACT inhaler Inhale 2 puffs into the lungs every 4 (four) hours as needed for wheezing or shortness of breath.     amLODipine (NORVASC) 5 MG tablet Take 5 mg by mouth daily.    cinacalcet (SENSIPAR) 60 MG tablet Take 60 mg by mouth daily. With largest meal    clopidogrel (PLAVIX) 75 MG tablet Take 75 mg by mouth daily.    esomeprazole (NEXIUM) 40 MG capsule Take 40 mg by mouth daily.     ferrous sulfate 325 (65 FE) MG tablet Take 325 mg by mouth daily.    folic acid-vitamin b complex-vitamin c-selenium-zinc (DIALYVITE) 3 MG TABS tablet Take 1 tablet by mouth daily.    furosemide (LASIX) 80 MG tablet Take 80 mg by mouth daily. Patient takes on non dialysis days.    isosorbide mononitrate (IMDUR) 30 MG 24 hr tablet Take 30 mg by mouth daily.    acetaminophen (TYLENOL) 325 MG tablet Take 650 mg by mouth every 6 (six) hours as needed for moderate pain or fever.    calcium carbonate (TUMS - DOSED IN MG ELEMENTAL CALCIUM) 500 MG chewable tablet Chew 1 tablet by mouth daily.    Lidocaine-Prilocaine, Bulk, 3.2-2.0 % CREA 1 application by Other route as needed (for tx). Apply to site 20-45 minutes before tx.    traMADol (ULTRAM) 50 MG tablet Take 50 mg by mouth 2 (two) times daily as needed for moderate pain.      STOP taking these medications     sevelamer carbonate (RENVELA) 800 MG tablet          DISCHARGE INSTRUCTIONS:   1. PCP f/u in 2 weeks 2. Home health 3. Nephrology f/u for dialysis in 2 days 4. Antibiotic with dialysis  If you experience worsening of your admission symptoms, develop shortness of breath, life threatening emergency, suicidal or homicidal thoughts you must seek medical attention immediately by calling 911 or calling your MD immediately  if symptoms less severe.  You Must read complete  instructions/literature along with all the possible adverse reactions/side effects for all the Medicines you take and that have been prescribed to you. Take any new Medicines after you have completely understood and accept all the possible adverse reactions/side effects.   Please note  You were cared for by a hospitalist during your hospital stay. If you have any questions about your discharge medications or the care you received while you were in the hospital after you are discharged, you can call the unit and asked to speak with the hospitalist on call if the hospitalist that took care of you is not available. Once you are discharged, your primary care physician will handle any further medical issues. Please note that NO REFILLS for any discharge medications will be authorized once you are discharged, as it is imperative that you return to your primary care physician (or establish a relationship with a primary  care physician if you do not have one) for your aftercare needs so that they can reassess your need for medications and monitor your lab values.    Today   CHIEF COMPLAINT:   Chief Complaint  Patient presents with  . Flank Pain    VITAL SIGNS:  Blood pressure 170/83, pulse 66, temperature 97.8 F (36.6 C), temperature source Oral, resp. rate 15, height 5\' 2"  (1.575 m), weight 90 kg (198 lb 6.6 oz), SpO2 96 %.  I/O:   Intake/Output Summary (Last 24 hours) at 07/10/14 1335 Last data filed at 07/10/14 0900  Gross per 24 hour  Intake    390 ml  Output      0 ml  Net    390 ml    PHYSICAL EXAMINATION:   Physical Exam  Constitutional: She is oriented to person, place, and time and well-developed, well-nourished, and in no distress.  HENT:  Head: Normocephalic and atraumatic.  Eyes: Conjunctivae and EOM are normal. Pupils are equal, round, and reactive to light.  Neck: Normal range of motion. Neck supple. No tracheal deviation present. No thyromegaly present.   Cardiovascular: Normal rate, regular rhythm and normal heart sounds.  Right IJ dialysis catheter in place  Pulmonary/Chest: Effort normal and breath sounds normal. No respiratory distress. She has no wheezes. She exhibits no tenderness.  Abdominal: Soft. Bowel sounds are normal. She exhibits no distension. There is no tenderness. There is no guarding.  Musculoskeletal: Normal range of motion.  She does have a right arm fistula that seems like it has a thrill however patient says it's not working and they're in the process of taking it down.  Neurological: She is alert and oriented to person, place, and time. No cranial nerve deficit.  Skin: Skin is warm and dry. No rash noted.  Psychiatric: Mood normal. She has a flat affect.  Affect is flat.   DATA REVIEW:   CBC  Recent Labs Lab 07/10/14 0339  WBC 7.8  HGB 8.4*  HCT 25.4*  PLT 249    Chemistries   Recent Labs Lab 07/10/14 0339  NA 136  K 3.9  CL 95*  CO2 28  GLUCOSE 84  BUN 27*  CREATININE 6.59*  CALCIUM 6.6*    Cardiac Enzymes  Recent Labs Lab 07/03/14 1858  TROPONINI 0.03    Microbiology Results  Results for orders placed or performed during the hospital encounter of 07/03/14  Culture, blood (routine x 2)     Status: None (Preliminary result)   Collection Time: 07/05/14  9:00 PM  Result Value Ref Range Status   Specimen Description BLOOD  Final   Special Requests NONE  Final   Culture  Setup Time   Final    GRAM POSITIVE COCCI IN BOTH AEROBIC AND ANAEROBIC BOTTLES CRITICAL RESULT CALLED TO, READ BACK BY AND VERIFIED WITH: ABIGAIL JACKSON AT 1349 DV CRITICAL VALUE NOTED.  VALUE IS CONSISTENT WITH PREVIOUSLY REPORTED AND CALLED VALUE. 07/06/14 AT 1600 BY JEF    Culture GRAM POSITIVE COCCI IDENTIFICATION TO FOLLOW   Final   Report Status PENDING  Incomplete  Culture, blood (routine x 2)     Status: None   Collection Time: 07/05/14  9:00 PM  Result Value Ref Range Status   Specimen Description BLOOD   Final   Special Requests NONE  Final   Culture  Setup Time   Final    GRAM POSITIVE COCCI IN BOTH AEROBIC AND ANAEROBIC BOTTLES CRITICAL VALUE NOTED.  VALUE IS CONSISTENT  WITH PREVIOUSLY REPORTED AND CALLED VALUE.    Culture   Final    ALPHA HEMOLYTIC STREPTOCOCCUS SPECIES IN BOTH AEROBIC AND ANAEROBIC BOTTLES REFER TO H5O66 FOR SENSITIVITY RESULTS    Report Status 07/09/2014 FINAL  Final  Culture, blood (routine x 2)     Status: None (Preliminary result)   Collection Time: 07/06/14  3:38 PM  Result Value Ref Range Status   Specimen Description BLOOD  Final   Special Requests Normal  Final   Culture NO GROWTH 4 DAYS  Final   Report Status PENDING  Incomplete  Culture, blood (routine x 2)     Status: None (Preliminary result)   Collection Time: 07/06/14  4:29 PM  Result Value Ref Range Status   Specimen Description BLOOD  Final   Special Requests Normal  Final   Culture NO GROWTH 4 DAYS  Final   Report Status PENDING  Incomplete    RADIOLOGY:  No results found.  EKG:   Orders placed or performed during the hospital encounter of 07/03/14  . EKG 12-Lead  . EKG 12-Lead  . EKG 12-Lead  . EKG 12-Lead  . EKG 12-Lead  . EKG 12-Lead      Management plans discussed with the patient, family and they are in agreement.  CODE STATUS:     Code Status Orders        Start     Ordered   07/03/14 1407  Full code   Continuous     07/03/14 1406      TOTAL TIME TAKING CARE OF THIS PATIENT: 40 minutes.    Gladstone Lighter M.D on 07/10/2014 at 1:35 PM  Between 7am to 6pm - Pager - [phone]  After 6pm go to [email or web address] - password [email or web address] [facility]  [facility] [facility]  Office  [phone]  CC: Primary care physician; No primary care provider on file.

## 2014-07-10 NOTE — Progress Notes (Signed)
[facility] NAME: Kelsey Peterson    MR#:  [id number]  DATE OF BIRTH:  05/29/1955  SUBJECTIVE:  CHIEF COMPLAINT:   Chief Complaint  Patient presents with  . Flank Pain   - Patient is seen during dialysis. Denies any complaints doing well. Hemoglobin is stable. -Blood cultures positive for Streptococcus. Will be discharged on vancomycin with dialysis just due to convenience of being given during dialysis. -Requesting for hot dog and french fries and not renally restricted diet. -Qualified for nocturnal home O2.  REVIEW OF SYSTEMS:  Review of Systems  Constitutional: Negative for fever, weight loss, malaise/fatigue and diaphoresis.  HENT: Positive for hearing loss. Negative for ear discharge, ear pain, nosebleeds, sore throat and tinnitus.   Eyes: Negative for blurred vision and pain.  Respiratory: Negative for cough, hemoptysis, shortness of breath and wheezing.   Cardiovascular: Negative for chest pain, palpitations, orthopnea and leg swelling.  Gastrointestinal: Negative for heartburn, nausea, vomiting, abdominal pain, diarrhea, constipation and blood in stool.  Genitourinary: Negative for dysuria, urgency and frequency.  Musculoskeletal: Negative for myalgias and back pain.  Skin: Negative for itching and rash.  Neurological: Negative for dizziness, tingling, tremors, focal weakness, seizures, weakness and headaches.  Psychiatric/Behavioral: Negative for depression. The patient is not nervous/anxious.    DRUG ALLERGIES:   Allergies  Allergen Reactions  . Contrast Media [Iodinated Diagnostic Agents]   . Morphine And Related Hives  . Other Rash    Blood Pressure Pill (Pt doesn't remember name)    VITALS:  Blood pressure 170/89, pulse 67, temperature 97.8 F (36.6 C), temperature source Oral, resp. rate 15, height 5\' 2"  (1.575 m), weight 90 kg (198 lb 6.6 oz), SpO2 96 %. PHYSICAL EXAMINATION:  Physical Exam   Constitutional: She is oriented to person, place, and time and well-developed, well-nourished, and in no distress.  HENT:  Head: Normocephalic and atraumatic.  Eyes: Conjunctivae and EOM are normal. Pupils are equal, round, and reactive to light.  Neck: Normal range of motion. Neck supple. No tracheal deviation present. No thyromegaly present.  Cardiovascular: Normal rate, regular rhythm and normal heart sounds.   Right IJ dialysis catheter in place  Pulmonary/Chest: Effort normal and breath sounds normal. No respiratory distress. She has no wheezes. She exhibits no tenderness.  Abdominal: Soft. Bowel sounds are normal. She exhibits no distension. There is no tenderness. There is no guarding.  Musculoskeletal: Normal range of motion.  She does have a right arm fistula that seems like it has a thrill however patient says it's not working and they're in the process of taking it down.  Neurological: She is alert and oriented to person, place, and time. No cranial nerve deficit.  Skin: Skin is warm and dry. No rash noted.  Psychiatric: Mood normal. She has a flat affect.  Affect is flat.   LABORATORY PANEL:   CBC  Recent Labs Lab 07/10/14 0339  WBC 7.8  HGB 8.4*  HCT 25.4*  PLT 249   ------------------------------------------------------------------------------------------------------------------ Chemistries   Recent Labs Lab 07/10/14 0339  NA 136  K 3.9  CL 95*  CO2 28  GLUCOSE 84  BUN 27*  CREATININE 6.59*  CALCIUM 6.6*    ASSESSMENT AND PLAN:   * Sepsis-admitted with fever and 2 out of 4 blood cultures growing alpha hemolytic strep. -Continue vancomycin, as it can be given easily with dialysis. -Last negative blood cultures are from 07/06/2014 so will need 2 weeks of antibiotics treatment  since then which will be 07/20/2014.  -No further fevers. Appreciate ID consult. -No need to change the permacath since it's not Staphylococcus. -2-D echo with no evidence of  endocarditis or vegetations..   * Paroxysmal SVT: Likely due to severe anemia. continue metoprolol for rate control.  - Cardiology following - Heart rate is very well controlled at this time. Only on low-dose metoprolol.  * Pulmonary edema with Fluid overload & hypoxia: On admission Likely due to inadequate dialysis,  Dialysis per nephrology  -On Tuesday Thursday Saturday schedule.  -Next dialysis today. Does say she is short of breath, sats are 100% even on exertion. -Qualified for nocturnal home oxygen. Likely has underlying diastolic CHF.  * Elevated troponin, possible due to demanding ischemia: Cardiology following.  - No acute MI. Monitor at this time.  * ESRD on hemodialysis: Nephrology managing dialysis -Tuesday Thursday Saturday schedule. Next dialysis today. Has a right chest permacath . Also has a right arm fistula but according to the patient that is not working.  * Anemia of chronic kidney disease: Hemoglobin down to 5.8 on admission, received 2 unit of packed red blood cell transfusion with improvement of hemoglobin up to 7.8 -> 8.0.  - GI consultation appreciated - -hemoglobin stable - EGD normal, colonoscopy with polyps and no malignancy  All the records are reviewed and case discussed with Care Management/Social Worker. Management plans discussed with the patient, family and they are in agreement.  Discussed with Dr. Holley Raring and Dr. Ola Spurr  CODE STATUS: Full Code   TOTAL TIME TAKING CARE OF THIS PATIENT: 37 minutes.   More than 50% of the time was spent in counseling/coordination of care: YES  POSSIBLE D/C today, DEPENDING ON CLINICAL CONDITION.   Gladstone Lighter M.D on 07/10/2014 at 1:05 PM  Between 7am to 6pm - Pager - [phone]  After 6pm go to [email or web address] - password [email or web address] [facility]  [place]  Office  [phone]  CC:  Primary care physician; No primary care provider on file.

## 2014-07-10 NOTE — Care Management (Signed)
Patient has qualified for nocturnal 02 and portable tanks has been delivered to patient's room.  [facility] and dialysis coordinator notified.  [facility] will see for nursing. Physical therapy and aide.  Nursing will open to care 07/11/2014.  Son to transport home

## 2014-07-10 NOTE — Progress Notes (Signed)
Subjective:  Patient seen during HD, tolerating well thus far.  Would like renvela to be switched to phoslo.   Objective:  Vital signs in last 24 hours:  Temp:  [97.2 F (36.2 C)-98.1 F (36.7 C)] 97.8 F (36.6 C) (06/21 1020) Pulse Rate:  [56-72] 65 (06/21 1030) Resp:  [17-24] 17 (06/21 1030) BP: (133-166)/(78-90) 165/90 mmHg (06/21 1030) SpO2:  [93 %-100 %] 93 % (06/21 0518) Weight:  [90 kg (198 lb 6.6 oz)-90.719 kg (200 lb)] 90 kg (198 lb 6.6 oz) (06/21 1020)  Weight change: 0.816 kg (1 lb 12.8 oz) Filed Weights   07/09/14 0512 07/09/14 1326 07/10/14 1020  Weight: 89.903 kg (198 lb 3.2 oz) 90.719 kg (200 lb) 90 kg (198 lb 6.6 oz)    Intake/Output: I/O last 3 completed shifts: In: 150 [I.V.:150] Out: 0      Physical Exam: General: NAD  HEENT Anicteric, moist mucus membranes  Neck supple  Pulm/lungs CTAB normal effort  CVS/Heart Irregular, no rub  Abdomen:  Soft, Non tender, BS present  Extremities: Trace edema  Neurologic: Awake, alert, follows commands  Skin: No acute rashes  Access: Rt IJ PC       Basic Metabolic Panel:  Recent Labs Lab 07/07/14 0407 07/08/14 0417 07/08/14 1651 07/09/14 0421 07/10/14 0339  NA 136 138 138 135 136  K 4.3 3.9 4.4 4.0 3.9  CL 96* 99* 97* 98* 95*  CO2 26 32 30 28 28   GLUCOSE 84 108* 93 87 84  BUN 33* 18 20 21* 27*  CREATININE 6.54* 4.24* 4.81* 5.38* 6.59*  CALCIUM 6.7* 6.9* 6.9* 6.4* 6.6*  PHOS  --   --  2.6  --   --      CBC:  Recent Labs Lab 07/07/14 0407 07/08/14 0417 07/08/14 1651 07/09/14 0421 07/10/14 0339  WBC 15.5* 8.5 8.0 7.4 7.8  HGB 8.0* 7.9* 8.5* 8.0* 8.4*  HCT 25.1* 24.1* 26.5* 24.7* 25.4*  MCV 78.7* 78.5* 79.6* 79.2* 78.8*  PLT 212 221 251 241 249      Microbiology: Results for orders placed or performed during the hospital encounter of 07/03/14  Culture, blood (routine x 2)     Status: None (Preliminary result)   Collection Time: 07/05/14  9:00 PM  Result Value Ref Range Status   Specimen Description BLOOD  Final   Special Requests NONE  Final   Culture  Setup Time   Final    GRAM POSITIVE COCCI IN BOTH AEROBIC AND ANAEROBIC BOTTLES CRITICAL RESULT CALLED TO, READ BACK BY AND VERIFIED WITH: ABIGAIL JACKSON AT 0962 DV CRITICAL VALUE NOTED.  VALUE IS CONSISTENT WITH PREVIOUSLY REPORTED AND CALLED VALUE. 07/06/14 AT 1600 BY JEF    Culture GRAM POSITIVE COCCI IDENTIFICATION TO FOLLOW   Final   Report Status PENDING  Incomplete  Culture, blood (routine x 2)     Status: None   Collection Time: 07/05/14  9:00 PM  Result Value Ref Range Status   Specimen Description BLOOD  Final   Special Requests NONE  Final   Culture  Setup Time   Final    GRAM POSITIVE COCCI IN BOTH AEROBIC AND ANAEROBIC BOTTLES CRITICAL VALUE NOTED.  VALUE IS CONSISTENT WITH PREVIOUSLY REPORTED AND CALLED VALUE.    Culture   Final    ALPHA HEMOLYTIC STREPTOCOCCUS SPECIES IN BOTH AEROBIC AND ANAEROBIC BOTTLES REFER TO H5O66 FOR SENSITIVITY RESULTS    Report Status 07/09/2014 FINAL  Final  Culture, blood (routine x 2)  Status: None (Preliminary result)   Collection Time: 07/06/14  3:38 PM  Result Value Ref Range Status   Specimen Description BLOOD  Final   Special Requests Normal  Final   Culture NO GROWTH 4 DAYS  Final   Report Status PENDING  Incomplete  Culture, blood (routine x 2)     Status: None (Preliminary result)   Collection Time: 07/06/14  4:29 PM  Result Value Ref Range Status   Specimen Description BLOOD  Final   Special Requests Normal  Final   Culture NO GROWTH 4 DAYS  Final   Report Status PENDING  Incomplete    Coagulation Studies: No results for input(s): LABPROT, INR in the last 72 hours.  Urinalysis: No results for input(s): COLORURINE, LABSPEC, PHURINE, GLUCOSEU, HGBUR, BILIRUBINUR, KETONESUR, PROTEINUR, UROBILINOGEN, NITRITE, LEUKOCYTESUR in the last 72 hours.  Invalid input(s): APPERANCEUR    Imaging: No results found.   Medications:   . sodium  chloride     . sodium chloride   Intravenous Once  . amiodarone  400 mg Oral BID  . amLODipine  5 mg Oral Daily  . b complex-vitamin c-folic acid  1 tablet Oral Daily  . bisacodyl  10 mg Oral Once  . calcium carbonate  1 tablet Oral Daily  . cefTRIAXone (ROCEPHIN)  IV  1 g Intravenous Q24H  . cinacalcet  60 mg Oral Q breakfast  . ferrous sulfate  325 mg Oral Daily  . furosemide  80 mg Oral Daily  . isosorbide mononitrate  30 mg Oral Daily  . metoprolol tartrate  12.5 mg Oral BID  . pantoprazole  40 mg Oral Daily  . sevelamer carbonate  800 mg Oral QID  . 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  ESRD on HD TTHS:  - Pt seen during HD today, tolerating well, next HD as outpt.  2. Pulmonary edema J81.0 - denies shortness of breath at present, improved with HD, UF target today 2.5kg.  3  AOCKD:   status post transfusion 2/6, June 14, can't tolerate IV iron. - pt to continue epogen as outpt.   4.  SHPTH of renal origin: phos 2.6 at last check, discontinue renvela in favor of phoslo per pt preference as renvela is hard for to swallow.  5. Fever, GPC in blood - awaiting further input from Dr. Ola Spurr, no need for permcath to be removed.      LOS: 7 Kelsey Peterson 6/21/201610:56 AM

## 2014-07-10 NOTE — Discharge Instructions (Signed)
°  DIET:  Renal diet  DISCHARGE CONDITION:  Stable  ACTIVITY:  Activity as tolerated  OXYGEN:  Home Oxygen: Yes.   only nocturnal oxygen   Oxygen Delivery: 2 liters/min via Patient connected to nasal cannula oxygen only nocturnal oxygen  DISCHARGE LOCATION:  Home with home health   If you experience worsening of your admission symptoms, develop shortness of breath, life threatening emergency, suicidal or homicidal thoughts you must seek medical attention immediately by calling 911 or calling your MD immediately  if symptoms less severe.  You Must read complete instructions/literature along with all the possible adverse reactions/side effects for all the Medicines you take and that have been prescribed to you. Take any new Medicines after you have completely understood and accpet all the possible adverse reactions/side effects.   Please note  You were cared for by a hospitalist during your hospital stay. If you have any questions about your discharge medications or the care you received while you were in the hospital after you are discharged, you can call the unit and asked to speak with the hospitalist on call if the hospitalist that took care of you is not available. Once you are discharged, your primary care physician will handle any further medical issues. Please note that NO REFILLS for any discharge medications will be authorized once you are discharged, as it is imperative that you return to your primary care physician (or establish a relationship with a primary care physician if you do not have one) for your aftercare needs so that they can reassess your need for medications and monitor your lab values.  OXYGEN TO BE USED AT NIGHT SUPPLIED THROUGH [facility]  [facility]: [place], PHYSICAL THERAPY AND AIDE  [phone].  A NURSE WILL SEE PATIENT 07/11/2014

## 2014-07-10 NOTE — Progress Notes (Signed)
O2 education given to patient and son by home health representative. No further needs from Care Management. Patient given discharge teaching and paperwork regarding medications, diet, follow-up appointments and activity. Patient understanding verbalized. No complaints at this time. IV and telemetry discontinued. Skin assessment as previously charted and vitals are stable. Patient being discharged to home. Son present during discharge teaching.  Toniann Ket

## 2014-07-10 NOTE — Care Management (Signed)
Have reached out to cardiopulmonary assist with interpreting the overnight pulse oximetry results and to assist in identifying actual time that patient dropped below 88% and not 89% to

## 2014-07-10 NOTE — Op Note (Signed)
[facility] Gastroenterology Patient Name: Kelsey Peterson Procedure Date: 07/09/2014 2:31 PM MRN: [id number] Account #: [id number] Date of Birth: May 14, 1955 Admit Type: Inpatient Age: 59 Room: [facility] ENDO ROOM 4 Gender: Female Note Status: Finalized Procedure:         Upper GI endoscopy Indications:       Generalized abdominal pain, Iron deficiency anemia                     secondary to chronic blood loss, Heme positive stool Providers:         Lupita Dawn. Candace Cruise, MD Referring MD:      Forest Gleason Md, MD (Referring MD) Medicines:         Monitored Anesthesia Care Complications:     No immediate complications. Procedure:         Pre-Anesthesia Assessment:                    - Prior to the procedure, a History and Physical was                     performed, and patient medications, allergies and                     sensitivities were reviewed. The patient's tolerance of                     previous anesthesia was reviewed.                    - The risks and benefits of the procedure and the sedation                     options and risks were discussed with the patient. All                     questions were answered and informed consent was obtained.                    - After reviewing the risks and benefits, the patient was                     deemed in satisfactory condition to undergo the procedure.                    After obtaining informed consent, the endoscope was passed                     under direct vision. Throughout the procedure, the                     patient's blood pressure, pulse, and oxygen saturations                     were monitored continuously. The Olympus GIF-160 endoscope                     (S#. [id number]) was introduced through the mouth, and                     advanced to the second part of duodenum. The upper GI                     endoscopy was accomplished without difficulty. The patient  tolerated the procedure  well. Findings:      The examined esophagus was normal.      The entire examined stomach was normal.      The examined duodenum was normal. Impression:        - Normal esophagus.                    - Normal stomach.                    - Normal examined duodenum.                    - No specimens collected. Recommendation:    - Observe patient's clinical course.                    - The findings and recommendations were discussed with the                     patient.                    - Proceed to colonoscopy Procedure Code(s): --- Professional ---                    [phone], Esophagogastroduodenoscopy, flexible, transoral;                     diagnostic, including collection of specimen(s) by                     brushing or washing, when performed (separate procedure) Diagnosis Code(s): --- Professional ---                    R10.84, Generalized abdominal pain                    D50.0, Iron deficiency anemia secondary to blood loss                     (chronic)                    R19.5, Other fecal abnormalities CPT copyright 2014 American Medical Association. All rights reserved. The codes documented in this report are preliminary and upon coder review may  be revised to meet current compliance requirements. Hulen Luster, MD 07/09/2014 2:42:23 PM This report has been signed electronically. Number of Addenda: 0 Note Initiated On: 07/09/2014 2:31 PM      [facility]

## 2014-07-10 NOTE — Care Management (Signed)
Provided [facility] with record of overnight pulse oximetry to determine if requests will qualify patient for nocturnal 02

## 2014-07-11 LAB — CULTURE, BLOOD (ROUTINE X 2)
CULTURE: NO GROWTH
Culture: NO GROWTH
Special Requests: NORMAL
Special Requests: NORMAL

## 2014-07-11 LAB — SURGICAL PATHOLOGY

## 2014-07-13 ENCOUNTER — Encounter: Payer: Medicare Other | Admitting: Cardiovascular Disease

## 2014-07-13 ENCOUNTER — Encounter: Payer: Self-pay | Admitting: *Deleted

## 2014-07-13 ENCOUNTER — Encounter: Payer: Self-pay | Admitting: Emergency Medicine

## 2014-07-13 DIAGNOSIS — J449 Chronic obstructive pulmonary disease, unspecified: Secondary | ICD-10-CM | POA: Diagnosis present

## 2014-07-13 DIAGNOSIS — I120 Hypertensive chronic kidney disease with stage 5 chronic kidney disease or end stage renal disease: Secondary | ICD-10-CM | POA: Diagnosis present

## 2014-07-13 DIAGNOSIS — Z888 Allergy status to other drugs, medicaments and biological substances status: Secondary | ICD-10-CM

## 2014-07-13 DIAGNOSIS — K640 First degree hemorrhoids: Secondary | ICD-10-CM | POA: Diagnosis present

## 2014-07-13 DIAGNOSIS — Z8249 Family history of ischemic heart disease and other diseases of the circulatory system: Secondary | ICD-10-CM

## 2014-07-13 DIAGNOSIS — K91840 Postprocedural hemorrhage and hematoma of a digestive system organ or structure following a digestive system procedure: Secondary | ICD-10-CM | POA: Diagnosis not present

## 2014-07-13 DIAGNOSIS — N2581 Secondary hyperparathyroidism of renal origin: Secondary | ICD-10-CM | POA: Diagnosis present

## 2014-07-13 DIAGNOSIS — Z792 Long term (current) use of antibiotics: Secondary | ICD-10-CM

## 2014-07-13 DIAGNOSIS — Z9851 Tubal ligation status: Secondary | ICD-10-CM

## 2014-07-13 DIAGNOSIS — Z79899 Other long term (current) drug therapy: Secondary | ICD-10-CM

## 2014-07-13 DIAGNOSIS — I2510 Atherosclerotic heart disease of native coronary artery without angina pectoris: Secondary | ICD-10-CM | POA: Diagnosis present

## 2014-07-13 DIAGNOSIS — Z91041 Radiographic dye allergy status: Secondary | ICD-10-CM

## 2014-07-13 DIAGNOSIS — Z7902 Long term (current) use of antithrombotics/antiplatelets: Secondary | ICD-10-CM

## 2014-07-13 DIAGNOSIS — Z791 Long term (current) use of non-steroidal anti-inflammatories (NSAID): Secondary | ICD-10-CM

## 2014-07-13 DIAGNOSIS — D122 Benign neoplasm of ascending colon: Secondary | ICD-10-CM | POA: Diagnosis present

## 2014-07-13 DIAGNOSIS — E119 Type 2 diabetes mellitus without complications: Secondary | ICD-10-CM | POA: Diagnosis present

## 2014-07-13 DIAGNOSIS — Z86010 Personal history of colonic polyps: Secondary | ICD-10-CM

## 2014-07-13 DIAGNOSIS — D631 Anemia in chronic kidney disease: Secondary | ICD-10-CM | POA: Diagnosis present

## 2014-07-13 DIAGNOSIS — K625 Hemorrhage of anus and rectum: Secondary | ICD-10-CM | POA: Diagnosis not present

## 2014-07-13 DIAGNOSIS — K5730 Diverticulosis of large intestine without perforation or abscess without bleeding: Secondary | ICD-10-CM | POA: Diagnosis present

## 2014-07-13 DIAGNOSIS — Z885 Allergy status to narcotic agent status: Secondary | ICD-10-CM

## 2014-07-13 DIAGNOSIS — I252 Old myocardial infarction: Secondary | ICD-10-CM

## 2014-07-13 DIAGNOSIS — Z992 Dependence on renal dialysis: Secondary | ICD-10-CM

## 2014-07-13 DIAGNOSIS — Y838 Other surgical procedures as the cause of abnormal reaction of the patient, or of later complication, without mention of misadventure at the time of the procedure: Secondary | ICD-10-CM | POA: Diagnosis present

## 2014-07-13 DIAGNOSIS — I340 Nonrheumatic mitral (valve) insufficiency: Secondary | ICD-10-CM | POA: Diagnosis present

## 2014-07-13 DIAGNOSIS — B948 Sequelae of other specified infectious and parasitic diseases: Secondary | ICD-10-CM

## 2014-07-13 DIAGNOSIS — Z8661 Personal history of infections of the central nervous system: Secondary | ICD-10-CM

## 2014-07-13 DIAGNOSIS — H9190 Unspecified hearing loss, unspecified ear: Secondary | ICD-10-CM | POA: Diagnosis present

## 2014-07-13 DIAGNOSIS — K219 Gastro-esophageal reflux disease without esophagitis: Secondary | ICD-10-CM | POA: Diagnosis present

## 2014-07-13 DIAGNOSIS — N186 End stage renal disease: Secondary | ICD-10-CM | POA: Diagnosis present

## 2014-07-13 LAB — CBC
HEMATOCRIT: 26.4 % — AB (ref 35.0–47.0)
Hemoglobin: 8.5 g/dL — ABNORMAL LOW (ref 12.0–16.0)
MCH: 25.9 pg — ABNORMAL LOW (ref 26.0–34.0)
MCHC: 32 g/dL (ref 32.0–36.0)
MCV: 80.8 fL (ref 80.0–100.0)
Platelets: 299 10*3/uL (ref 150–440)
RBC: 3.27 MIL/uL — ABNORMAL LOW (ref 3.80–5.20)
RDW: 24 % — ABNORMAL HIGH (ref 11.5–14.5)
WBC: 10.9 10*3/uL (ref 3.6–11.0)

## 2014-07-13 LAB — COMPREHENSIVE METABOLIC PANEL
ALK PHOS: 66 U/L (ref 38–126)
ALT: 33 U/L (ref 14–54)
AST: 38 U/L (ref 15–41)
Albumin: 3 g/dL — ABNORMAL LOW (ref 3.5–5.0)
Anion gap: 9 (ref 5–15)
BILIRUBIN TOTAL: 0.9 mg/dL (ref 0.3–1.2)
BUN: 12 mg/dL (ref 6–20)
CHLORIDE: 103 mmol/L (ref 101–111)
CO2: 30 mmol/L (ref 22–32)
Calcium: 7.5 mg/dL — ABNORMAL LOW (ref 8.9–10.3)
Creatinine, Ser: 5.5 mg/dL — ABNORMAL HIGH (ref 0.44–1.00)
GFR calc Af Amer: 9 mL/min — ABNORMAL LOW (ref >60)
GFR calc non Af Amer: 8 mL/min — ABNORMAL LOW (ref >60)
Glucose, Bld: 92 mg/dL (ref 65–99)
POTASSIUM: 3.8 mmol/L (ref 3.5–5.1)
SODIUM: 142 mmol/L (ref 135–145)
Total Protein: 6.3 g/dL — ABNORMAL LOW (ref 6.5–8.1)

## 2014-07-13 LAB — PROTIME-INR
INR: 1.01
Prothrombin Time: 13.5 seconds (ref 11.4–15.0)

## 2014-07-13 NOTE — ED Notes (Signed)
Patient here with one episode of rectal bleeding tonight. Patient reports dark red stool. Patient was discharged from the hospital on Wednesday. Patient had an egd because she was losing blood but the egd was negative. Patient denies any pain. Patient is HD patient.

## 2014-07-14 ENCOUNTER — Inpatient Hospital Stay
Admission: EM | Admit: 2014-07-14 | Discharge: 2014-07-16 | DRG: 919 | Disposition: A | Discharge status: Home / Self Care | Payer: Medicare Other | Attending: Internal Medicine | Admitting: Internal Medicine

## 2014-07-14 ENCOUNTER — Encounter: Payer: Self-pay | Admitting: Internal Medicine

## 2014-07-14 DIAGNOSIS — I120 Hypertensive chronic kidney disease with stage 5 chronic kidney disease or end stage renal disease: Secondary | ICD-10-CM | POA: Diagnosis present

## 2014-07-14 DIAGNOSIS — K625 Hemorrhage of anus and rectum: Secondary | ICD-10-CM | POA: Diagnosis present

## 2014-07-14 DIAGNOSIS — K922 Gastrointestinal hemorrhage, unspecified: Secondary | ICD-10-CM | POA: Diagnosis present

## 2014-07-14 DIAGNOSIS — Y838 Other surgical procedures as the cause of abnormal reaction of the patient, or of later complication, without mention of misadventure at the time of the procedure: Secondary | ICD-10-CM | POA: Diagnosis present

## 2014-07-14 DIAGNOSIS — I340 Nonrheumatic mitral (valve) insufficiency: Secondary | ICD-10-CM | POA: Diagnosis present

## 2014-07-14 DIAGNOSIS — Z8249 Family history of ischemic heart disease and other diseases of the circulatory system: Secondary | ICD-10-CM | POA: Diagnosis not present

## 2014-07-14 DIAGNOSIS — K219 Gastro-esophageal reflux disease without esophagitis: Secondary | ICD-10-CM

## 2014-07-14 DIAGNOSIS — D122 Benign neoplasm of ascending colon: Secondary | ICD-10-CM | POA: Diagnosis present

## 2014-07-14 DIAGNOSIS — Z992 Dependence on renal dialysis: Secondary | ICD-10-CM | POA: Diagnosis not present

## 2014-07-14 DIAGNOSIS — K91840 Postprocedural hemorrhage and hematoma of a digestive system organ or structure following a digestive system procedure: Secondary | ICD-10-CM | POA: Diagnosis present

## 2014-07-14 DIAGNOSIS — N2581 Secondary hyperparathyroidism of renal origin: Secondary | ICD-10-CM | POA: Diagnosis present

## 2014-07-14 DIAGNOSIS — J449 Chronic obstructive pulmonary disease, unspecified: Secondary | ICD-10-CM

## 2014-07-14 DIAGNOSIS — Z7902 Long term (current) use of antithrombotics/antiplatelets: Secondary | ICD-10-CM | POA: Diagnosis not present

## 2014-07-14 DIAGNOSIS — Z8661 Personal history of infections of the central nervous system: Secondary | ICD-10-CM | POA: Diagnosis not present

## 2014-07-14 DIAGNOSIS — Z792 Long term (current) use of antibiotics: Secondary | ICD-10-CM | POA: Diagnosis not present

## 2014-07-14 DIAGNOSIS — Z885 Allergy status to narcotic agent status: Secondary | ICD-10-CM | POA: Diagnosis not present

## 2014-07-14 DIAGNOSIS — B948 Sequelae of other specified infectious and parasitic diseases: Secondary | ICD-10-CM | POA: Diagnosis not present

## 2014-07-14 DIAGNOSIS — Z86010 Personal history of colonic polyps: Secondary | ICD-10-CM | POA: Diagnosis not present

## 2014-07-14 DIAGNOSIS — K640 First degree hemorrhoids: Secondary | ICD-10-CM | POA: Diagnosis present

## 2014-07-14 DIAGNOSIS — Z79899 Other long term (current) drug therapy: Secondary | ICD-10-CM | POA: Diagnosis not present

## 2014-07-14 DIAGNOSIS — N186 End stage renal disease: Secondary | ICD-10-CM

## 2014-07-14 DIAGNOSIS — H9190 Unspecified hearing loss, unspecified ear: Secondary | ICD-10-CM | POA: Diagnosis present

## 2014-07-14 DIAGNOSIS — E119 Type 2 diabetes mellitus without complications: Secondary | ICD-10-CM | POA: Diagnosis present

## 2014-07-14 DIAGNOSIS — D6489 Other specified anemias: Secondary | ICD-10-CM

## 2014-07-14 DIAGNOSIS — Z888 Allergy status to other drugs, medicaments and biological substances status: Secondary | ICD-10-CM | POA: Diagnosis not present

## 2014-07-14 DIAGNOSIS — Z791 Long term (current) use of non-steroidal anti-inflammatories (NSAID): Secondary | ICD-10-CM | POA: Diagnosis not present

## 2014-07-14 DIAGNOSIS — I2510 Atherosclerotic heart disease of native coronary artery without angina pectoris: Secondary | ICD-10-CM | POA: Diagnosis present

## 2014-07-14 DIAGNOSIS — I10 Essential (primary) hypertension: Secondary | ICD-10-CM | POA: Diagnosis present

## 2014-07-14 DIAGNOSIS — K5730 Diverticulosis of large intestine without perforation or abscess without bleeding: Secondary | ICD-10-CM | POA: Diagnosis present

## 2014-07-14 DIAGNOSIS — Z91041 Radiographic dye allergy status: Secondary | ICD-10-CM | POA: Diagnosis not present

## 2014-07-14 DIAGNOSIS — I252 Old myocardial infarction: Secondary | ICD-10-CM | POA: Diagnosis not present

## 2014-07-14 DIAGNOSIS — Z9851 Tubal ligation status: Secondary | ICD-10-CM | POA: Diagnosis not present

## 2014-07-14 DIAGNOSIS — D631 Anemia in chronic kidney disease: Secondary | ICD-10-CM | POA: Diagnosis present

## 2014-07-14 HISTORY — DX: Gastro-esophageal reflux disease without esophagitis: K21.9

## 2014-07-14 HISTORY — DX: Chronic obstructive pulmonary disease, unspecified: J44.9

## 2014-07-14 HISTORY — DX: Atherosclerotic heart disease of native coronary artery without angina pectoris: I25.10

## 2014-07-14 HISTORY — DX: Essential (primary) hypertension: I10

## 2014-07-14 LAB — CBC
HCT: 22.9 % — ABNORMAL LOW (ref 35.0–47.0)
HEMATOCRIT: 26.7 % — AB (ref 35.0–47.0)
Hemoglobin: 8.6 g/dL — ABNORMAL LOW (ref 12.0–16.0)
Hemoglobin: 7.3 g/dL — ABNORMAL LOW (ref 12.0–16.0)
MCH: 26.2 pg (ref 26.0–34.0)
MCH: 25.8 pg — AB (ref 26.0–34.0)
MCHC: 32.1 g/dL (ref 32.0–36.0)
MCHC: 31.8 g/dL — ABNORMAL LOW (ref 32.0–36.0)
MCV: 81.7 fL (ref 80.0–100.0)
MCV: 81 fL (ref 80.0–100.0)
Platelets: 297 10*3/uL (ref 150–440)
Platelets: 285 10*3/uL (ref 150–440)
RBC: 3.26 MIL/uL — AB (ref 3.80–5.20)
RBC: 2.83 MIL/uL — ABNORMAL LOW (ref 3.80–5.20)
RDW: 24.8 % — ABNORMAL HIGH (ref 11.5–14.5)
RDW: 24.1 % — AB (ref 11.5–14.5)
WBC: 9.2 10*3/uL (ref 3.6–11.0)
WBC: 8.9 10*3/uL (ref 3.6–11.0)

## 2014-07-14 LAB — BASIC METABOLIC PANEL
Anion gap: 11 (ref 5–15)
BUN: 12 mg/dL (ref 6–20)
CALCIUM: 7.5 mg/dL — AB (ref 8.9–10.3)
CO2: 26 mmol/L (ref 22–32)
CREATININE: 5.64 mg/dL — AB (ref 0.44–1.00)
Chloride: 104 mmol/L (ref 101–111)
GFR calc Af Amer: 9 mL/min — ABNORMAL LOW (ref >60)
GFR calc non Af Amer: 7 mL/min — ABNORMAL LOW (ref >60)
Glucose, Bld: 76 mg/dL (ref 65–99)
Potassium: 4.3 mmol/L (ref 3.5–5.1)
Sodium: 141 mmol/L (ref 135–145)

## 2014-07-14 LAB — RENAL FUNCTION PANEL
ALBUMIN: 2.5 g/dL — AB (ref 3.5–5.0)
ANION GAP: 9 (ref 5–15)
BUN: <5 mg/dL — ABNORMAL LOW (ref 6–20)
CALCIUM: 7.2 mg/dL — AB (ref 8.9–10.3)
CHLORIDE: 100 mmol/L — AB (ref 101–111)
CO2: 30 mmol/L (ref 22–32)
Creatinine, Ser: 2.73 mg/dL — ABNORMAL HIGH (ref 0.44–1.00)
GFR, EST AFRICAN AMERICAN: 21 mL/min — AB (ref >60)
GFR, EST NON AFRICAN AMERICAN: 18 mL/min — AB (ref >60)
Glucose, Bld: 66 mg/dL (ref 65–99)
PHOSPHORUS: 2.6 mg/dL (ref 2.5–4.6)
POTASSIUM: 2.9 mmol/L — AB (ref 3.5–5.1)
Sodium: 139 mmol/L (ref 135–145)

## 2014-07-14 LAB — MRSA PCR SCREENING: MRSA by PCR: POSITIVE — AB

## 2014-07-14 LAB — C DIFFICILE QUICK SCREEN W PCR REFLEX
C Diff antigen: NEGATIVE
C Diff interpretation: NEGATIVE
C Diff toxin: NEGATIVE

## 2014-07-14 LAB — PREPARE RBC (CROSSMATCH)

## 2014-07-14 MED ORDER — ACETAMINOPHEN 325 MG PO TABS: 650.0000 mg | ORAL_TABLET | Freq: Four times a day (QID) | ORAL | Status: DC | PRN

## 2014-07-14 MED ORDER — EPOETIN ALFA 10000 UNIT/ML IJ SOLN: 10000.0000 [IU] | INTRAMUSCULAR | Status: DC

## 2014-07-14 MED ORDER — FUROSEMIDE 40 MG PO TABS: 80.0000 mg | ORAL_TABLET | ORAL | Status: DC

## 2014-07-14 MED ORDER — LIDOCAINE-PRILOCAINE 2.5-2.5 % EX CREA: 1.0000 "application " | TOPICAL_CREAM | CUTANEOUS | Status: DC | PRN

## 2014-07-14 MED ORDER — CINACALCET HCL 30 MG PO TABS: 60.0000 mg | ORAL_TABLET | Freq: Every day | ORAL | Status: DC

## 2014-07-14 MED ORDER — AMLODIPINE BESYLATE 5 MG PO TABS
5.0000 mg | ORAL_TABLET | Freq: Every day | ORAL | Status: DC
Start: 2014-07-14 — End: 2014-07-16
  Administered 2014-07-14 – 2014-07-16 (×3): 5 mg via ORAL
  Filled 2014-07-14 (×4): qty 1

## 2014-07-14 MED ORDER — MUPIROCIN 2 % EX OINT
1.0000 "application " | TOPICAL_OINTMENT | Freq: Two times a day (BID) | CUTANEOUS | Status: DC
  Administered 2014-07-14 – 2014-07-15 (×3): 1 via NASAL
  Filled 2014-07-14: qty 22

## 2014-07-14 MED ORDER — LIDOCAINE HCL (PF) 1 % IJ SOLN
5.0000 mL | INTRAMUSCULAR | Status: DC | PRN
  Filled 2014-07-14: qty 5

## 2014-07-14 MED ORDER — CINACALCET HCL 30 MG PO TABS
60.0000 mg | ORAL_TABLET | Freq: Every day | ORAL | Status: DC
  Administered 2014-07-15 – 2014-07-16 (×2): 60 mg via ORAL
  Filled 2014-07-14 (×4): qty 2

## 2014-07-14 MED ORDER — TRAMADOL HCL 50 MG PO TABS: 50.0000 mg | ORAL_TABLET | Freq: Two times a day (BID) | ORAL | Status: DC | PRN

## 2014-07-14 MED ORDER — ALTEPLASE 2 MG IJ SOLR
2.0000 mg | Freq: Once | INTRAMUSCULAR | Status: AC | PRN
  Filled 2014-07-14: qty 2

## 2014-07-14 MED ORDER — CHLORHEXIDINE GLUCONATE CLOTH 2 % EX PADS
6.0000 | MEDICATED_PAD | Freq: Every day | CUTANEOUS | Status: DC
  Administered 2014-07-15: 6 via TOPICAL

## 2014-07-14 MED ORDER — SODIUM CHLORIDE 0.9 % IV SOLN
100.0000 mL | INTRAVENOUS | Status: DC | PRN
  Administered 2014-07-15: 17:00:00 via INTRAVENOUS

## 2014-07-14 MED ORDER — ALBUTEROL SULFATE HFA 108 (90 BASE) MCG/ACT IN AERS: 2.0000 | INHALATION_SPRAY | RESPIRATORY_TRACT | Status: DC | PRN

## 2014-07-14 MED ORDER — AMIODARONE HCL 200 MG PO TABS
200.0000 mg | ORAL_TABLET | Freq: Two times a day (BID) | ORAL | Status: DC
  Administered 2014-07-14 – 2014-07-16 (×4): 200 mg via ORAL
  Filled 2014-07-14 (×5): qty 1

## 2014-07-14 MED ORDER — SODIUM CHLORIDE 0.9 % IV SOLN
8.0000 mg/h | INTRAVENOUS | Status: DC
  Administered 2014-07-14: 8 mg/h via INTRAVENOUS
  Filled 2014-07-14: qty 80

## 2014-07-14 MED ORDER — DIALYVITE 3000 3 MG PO TABS: 1.0000 | ORAL_TABLET | Freq: Every day | ORAL | Status: DC

## 2014-07-14 MED ORDER — PANTOPRAZOLE SODIUM 40 MG PO TBEC
40.0000 mg | DELAYED_RELEASE_TABLET | Freq: Every day | ORAL | Status: DC
  Administered 2014-07-14 – 2014-07-16 (×3): 40 mg via ORAL
  Filled 2014-07-14 (×4): qty 1

## 2014-07-14 MED ORDER — SODIUM CHLORIDE 0.9 % IJ SOLN
3.0000 mL | Freq: Two times a day (BID) | INTRAMUSCULAR | Status: DC
  Administered 2014-07-14 – 2014-07-15 (×3): 3 mL via INTRAVENOUS

## 2014-07-14 MED ORDER — SODIUM CHLORIDE 0.9 % IV SOLN
80.0000 mg | Freq: Once | INTRAVENOUS | Status: AC
  Administered 2014-07-14: 80 mg via INTRAVENOUS
  Filled 2014-07-14: qty 80

## 2014-07-14 MED ORDER — PENTAFLUOROPROP-TETRAFLUOROETH EX AERO
1.0000 | INHALATION_SPRAY | CUTANEOUS | Status: DC | PRN
Start: 2014-07-14 — End: 2014-07-16

## 2014-07-14 MED ORDER — METOPROLOL TARTRATE 25 MG PO TABS
12.5000 mg | ORAL_TABLET | Freq: Two times a day (BID) | ORAL | Status: DC
  Administered 2014-07-14 – 2014-07-16 (×4): 12.5 mg via ORAL
  Filled 2014-07-14 (×5): qty 1

## 2014-07-14 MED ORDER — ONDANSETRON HCL 4 MG PO TABS: 4.0000 mg | ORAL_TABLET | Freq: Four times a day (QID) | ORAL | Status: DC | PRN

## 2014-07-14 MED ORDER — PANTOPRAZOLE SODIUM 40 MG IV SOLR: 40.0000 mg | Freq: Two times a day (BID) | INTRAVENOUS | Status: DC

## 2014-07-14 MED ORDER — NEPRO/CARBSTEADY PO LIQD: 237.0000 mL | ORAL | Status: DC | PRN

## 2014-07-14 MED ORDER — SODIUM CHLORIDE 0.9 % IV SOLN
Freq: Once | INTRAVENOUS | Status: AC
  Administered 2014-07-15: 14:00:00 via INTRAVENOUS

## 2014-07-14 MED ORDER — CALCIUM ACETATE (PHOS BINDER) 667 MG PO CAPS
667.0000 mg | ORAL_CAPSULE | Freq: Three times a day (TID) | ORAL | Status: DC
  Administered 2014-07-14 – 2014-07-16 (×5): 667 mg via ORAL
  Filled 2014-07-14 (×6): qty 1

## 2014-07-14 MED ORDER — POTASSIUM CHLORIDE CRYS ER 20 MEQ PO TBCR
40.0000 meq | EXTENDED_RELEASE_TABLET | Freq: Once | ORAL | Status: AC
  Administered 2014-07-14: 40 meq via ORAL
  Filled 2014-07-14: qty 2

## 2014-07-14 MED ORDER — RENA-VITE PO TABS
1.0000 | ORAL_TABLET | Freq: Every day | ORAL | Status: DC
  Administered 2014-07-14: 1 via ORAL
  Filled 2014-07-14 (×3): qty 1

## 2014-07-14 MED ORDER — ALBUTEROL SULFATE (2.5 MG/3ML) 0.083% IN NEBU
2.5000 mg | INHALATION_SOLUTION | RESPIRATORY_TRACT | Status: DC | PRN
  Administered 2014-07-14: 2.5 mg via RESPIRATORY_TRACT
  Filled 2014-07-14: qty 3

## 2014-07-14 MED ORDER — SODIUM CHLORIDE 0.9 % IV SOLN
INTRAVENOUS | Status: DC
  Administered 2014-07-14: 06:00:00 via INTRAVENOUS

## 2014-07-14 MED ORDER — SODIUM CHLORIDE 0.9 % IV SOLN: 100.0000 mL | INTRAVENOUS | Status: DC | PRN

## 2014-07-14 MED ORDER — SODIUM CHLORIDE 0.9 % IV BOLUS (SEPSIS)
500.0000 mL | Freq: Once | INTRAVENOUS | Status: AC
  Administered 2014-07-14: 500 mL via INTRAVENOUS

## 2014-07-14 MED ORDER — ONDANSETRON HCL 4 MG/2ML IJ SOLN: 4.0000 mg | Freq: Four times a day (QID) | INTRAMUSCULAR | Status: DC | PRN

## 2014-07-14 MED ORDER — ISOSORBIDE MONONITRATE ER 30 MG PO TB24
30.0000 mg | ORAL_TABLET | Freq: Every day | ORAL | Status: DC
  Administered 2014-07-14 – 2014-07-16 (×3): 30 mg via ORAL
  Filled 2014-07-14 (×4): qty 1

## 2014-07-14 MED ORDER — HEPARIN SODIUM (PORCINE) 1000 UNIT/ML DIALYSIS
1000.0000 [IU] | INTRAMUSCULAR | Status: DC | PRN
  Administered 2014-07-14: 1000 [IU] via INTRAVENOUS_CENTRAL
  Filled 2014-07-14 (×2): qty 1

## 2014-07-14 MED ORDER — FERROUS SULFATE 325 (65 FE) MG PO TABS
325.0000 mg | ORAL_TABLET | Freq: Every day | ORAL | Status: DC
  Administered 2014-07-14 – 2014-07-16 (×3): 325 mg via ORAL
  Filled 2014-07-14 (×4): qty 1

## 2014-07-14 NOTE — ED Notes (Signed)
PIV attempt x 4 unsuccessful.

## 2014-07-14 NOTE — Progress Notes (Signed)
Alert and oriented. Sinus rhythm on telemetry. Rested quietly most of the day. No complaints.  Full assessment as charted. Dialysis today - did not remove any fluid d/t GI bleed.  Toniann Ket

## 2014-07-14 NOTE — Progress Notes (Signed)
Dr. Leslye Peer notified of patient's critical potassium level. Orders for 40 mEq PO potassium once. Order potassium lab draw for the morning.  Toniann Ket

## 2014-07-14 NOTE — ED Provider Notes (Signed)
[facility] Emergency Department Provider Note  ____________________________________________  Time seen: Approximately 3:27 AM  I have reviewed the triage vital signs and the nursing notes.   HISTORY  Chief Complaint Rectal Bleeding  Patient is deaf but can read lips and speak   HPI ARRAYA BUCK is a 59 y.o. female who presents with >3 episodes of rectal bleeding tonight. Patient is a dialysis patient (Tuesday/Thursday/Saturday) who was recently admitted to the hospital for pulmonary edema and found to have anemia with a hemoglobin of 5.8. She had a normal EGD and colonoscopy with 2 polyp removals by Dr. Candace Cruise on June 20. She presents with generalized weakness, feeling lightheaded with multiple episodes of dark red stools this evening. Denies fever, chills, chest pain, shortness of breath, abdominal pain, headache, numbness, tingling.   Past Medical History  Diagnosis Date  . COPD (chronic obstructive pulmonary disease)   . ESRD on hemodialysis   . DM2 (diabetes mellitus, type 2)   . Hepatitis C   . Migraine with aura   . HOH (hard of hearing)   . Mitral regurgitation     a. echo 06/2014: EF 60%, no WMA, GR1DD, mild AI, calcified mitral annulus, mod MR, no atrial septal defect or patent foramen ovale   . Cognitive impairment   . CAD (coronary artery disease)   . HTN (hypertension)     Patient Active Problem List   Diagnosis Date Noted  . End stage renal disease on dialysis   . Supraventricular tachycardia   . NSVT (nonsustained ventricular tachycardia)   . Pulmonary edema 07/03/2014  . Anemia 07/03/2014  . Pain in the chest   . Diarrhea   . NSTEMI (non-ST elevated myocardial infarction) 06/21/2014    Past Surgical History  Procedure Laterality Date  . Dialysis fistula creation    . Tubal ligation      Current Outpatient Rx  Name  Route  Sig  Dispense  Refill  . acetaminophen (TYLENOL) 325 MG tablet   Oral   Take 650 mg by mouth every 6  (six) hours as needed for moderate pain or fever.         Marland Kitchen albuterol (PROVENTIL HFA;VENTOLIN HFA) 108 (90 BASE) MCG/ACT inhaler   Inhalation   Inhale 2 puffs into the lungs every 4 (four) hours as needed for wheezing or shortness of breath.          Marland Kitchen amiodarone (PACERONE) 200 MG tablet   Oral   Take 1 tablet (200 mg total) by mouth 2 (two) times daily.   60 tablet   1   . amLODipine (NORVASC) 5 MG tablet   Oral   Take 5 mg by mouth daily.         . calcium acetate (PHOSLO) 667 MG capsule   Oral   Take 1 capsule (667 mg total) by mouth 3 (three) times daily with meals.   90 capsule   1   . calcium carbonate (TUMS - DOSED IN MG ELEMENTAL CALCIUM) 500 MG chewable tablet   Oral   Chew 1 tablet by mouth daily.         . cinacalcet (SENSIPAR) 60 MG tablet   Oral   Take 60 mg by mouth daily. With largest meal         . clopidogrel (PLAVIX) 75 MG tablet   Oral   Take 75 mg by mouth daily.         Marland Kitchen esomeprazole (NEXIUM) 40 MG capsule  Oral   Take 40 mg by mouth daily.          . ferrous sulfate 325 (65 FE) MG tablet   Oral   Take 325 mg by mouth daily.         . folic acid-vitamin b complex-vitamin c-selenium-zinc (DIALYVITE) 3 MG TABS tablet   Oral   Take 1 tablet by mouth daily.         . furosemide (LASIX) 80 MG tablet   Oral   Take 80 mg by mouth daily. Patient takes on non dialysis days.         . isosorbide mononitrate (IMDUR) 30 MG 24 hr tablet   Oral   Take 30 mg by mouth daily.         . Lidocaine-Prilocaine, Bulk, 2.5-2.5 % CREA   Other   1 application by Other route as needed (for tx). Apply to site 20-45 minutes before tx.         . metoprolol tartrate (LOPRESSOR) 25 MG tablet   Oral   Take 0.5 tablets (12.5 mg total) by mouth 2 (two) times daily.   30 tablet   1   . traMADol (ULTRAM) 50 MG tablet   Oral   Take 50 mg by mouth 2 (two) times daily as needed for moderate pain.         . vancomycin 1,000 mg in sodium  chloride 0.9 % 250 mL   Intravenous   Inject 1,000 mg into the vein Every Tuesday,Thursday,and Saturday with dialysis.   5000 mg   0     Allergies Contrast media; Morphine and related; and Other  Family History  Problem Relation Age of Onset  . Heart disease Mother   . Hypertension Mother     Social History History  Substance Use Topics  . Smoking status: Never Smoker   . Smokeless tobacco: Never Used  . Alcohol Use: No    Review of Systems Constitutional: No fever/chills Eyes: No visual changes. ENT: No sore throat. Cardiovascular: Denies chest pain. Respiratory: Denies shortness of breath. Gastrointestinal: Positive for rectal bleeding. No abdominal pain.  No nausea, no vomiting.  No diarrhea.  No constipation. Genitourinary: Negative for dysuria. Musculoskeletal: Negative for back pain. Skin: Negative for rash. Neurological: Negative for headaches, focal weakness or numbness.  10-point ROS otherwise negative.  ____________________________________________   PHYSICAL EXAM:  VITAL SIGNS: ED Triage Vitals  Enc Vitals Group     BP 07/13/14 2220 180/95 mmHg     Pulse Rate 07/13/14 2220 90     Resp 07/13/14 2220 20     Temp 07/13/14 2220 97.9 F (36.6 C)     Temp Source 07/13/14 2220 Oral     SpO2 07/13/14 2220 97 %     Weight 07/13/14 2220 198 lb (89.812 kg)     Height 07/13/14 2220 5\' 2"  (1.575 m)     Head Cir --      Peak Flow --      Pain Score --      Pain Loc --      Pain Edu? --      Excl. in [place]? --     Constitutional: Alert and oriented. Well appearing and in mild acute distress. Eyes: Conjunctivae are normal. PERRL. EOMI. Head: Atraumatic. Nose: No congestion/rhinnorhea. Mouth/Throat: Mucous membranes are moist.  Oropharynx non-erythematous. Neck: No stridor.   Cardiovascular: Normal rate, regular rhythm. Grossly normal heart sounds.  Good peripheral circulation. Respiratory: Normal respiratory effort.  No  retractions. Faint bibasilar rales.   Gastrointestinal: Soft and nontender. No distention. No abdominal bruits. No CVA tenderness. Rectal: Multiple bits of paper towel with dark green stool noted about rectum. Upon removal of my gloved finger, a dark red clot of blood shot out from patient's rectum. Hematochezia now noted. Musculoskeletal: No lower extremity tenderness nor edema.  No joint effusions. Neurologic:  Normal speech and language. No gross focal neurologic deficits are appreciated. Speech is normal.  Skin:  Skin is warm, dry and intact. No rash noted. Psychiatric: Mood and affect are normal. Speech and behavior are normal.  ____________________________________________   LABS (all labs ordered are listed, but only abnormal results are displayed)  Labs Reviewed  CBC - Abnormal; Notable for the following:    RBC 3.27 (*)    Hemoglobin 8.5 (*)    HCT 26.4 (*)    MCH 25.9 (*)    RDW 24.0 (*)    All other components within normal limits  COMPREHENSIVE METABOLIC PANEL - Abnormal; Notable for the following:    Creatinine, Ser 5.50 (*)    Calcium 7.5 (*)    Total Protein 6.3 (*)    Albumin 3.0 (*)    GFR calc non Af Amer 8 (*)    GFR calc Af Amer 9 (*)    All other components within normal limits  PROTIME-INR  TYPE AND SCREEN   ____________________________________________  EKG  ED ECG REPORT I, SUNG,JADE J, the attending physician, personally viewed and interpreted this ECG.   Date: 07/14/2014  EKG Time: 2226  Rate: 84  Rhythm: normal EKG, normal sinus rhythm  Axis: Normal  Intervals:none  ST&T Change: Nonspecific  ____________________________________________  RADIOLOGY  None ____________________________________________   PROCEDURES  Procedure(s) performed: None  Critical Care performed: No  ____________________________________________   INITIAL IMPRESSION / ASSESSMENT AND PLAN / ED COURSE  Pertinent labs & imaging results that were available during my care of the patient were reviewed  by me and considered in my medical decision making (see chart for details).  59 year old female with post-polypectomy rectal bleeding. Will place 2 large bore peripheral IVs, infuse judicious fluids given patient is a dialysis patient. Discussed case with Dr. Jannifer Franklin who will evaluate patient in the ED for hospital admission. ____________________________________________   FINAL CLINICAL IMPRESSION(S) / ED DIAGNOSES  Final diagnoses:  Rectal bleeding  Anemia due to other cause  Postoperative hemorrhage involving digestive system following digestive system procedure      Kelsey Blanch, MD 07/14/14 [phone]

## 2014-07-14 NOTE — Progress Notes (Signed)
Dr. Posey Pronto notified of drop in patient's Hgb. Per MD, change Hgb lab draw frequency to every 8 hours. Order 1 unit blood transfusion.  Kelsey Peterson

## 2014-07-14 NOTE — Plan of Care (Signed)
Problem: Phase II Progression Outcomes Goal: No active bleeding Outcome: Not Met (add Reason) Still bleeding

## 2014-07-14 NOTE — Progress Notes (Signed)
Patient refused bed alarm. States the flashing lights will keep her awake. I explained the reasoning for the alarm and that with her being so weak she should keep it on but she adamantly refused. Lights turned off per patient request. She states she will not get up without assistance and will call for help if she needs it. Will round frequently on patient.

## 2014-07-14 NOTE — Progress Notes (Signed)
PRE HD   

## 2014-07-14 NOTE — H&P (Addendum)
[place] [facility] [place] NAME: Kelsey Peterson    MR#:  [id number]  DATE OF BIRTH:  1955/12/31  DATE OF ADMISSION:  07/14/2014  PRIMARY CARE PHYSICIAN: No primary care provider on file.   REQUESTING/REFERRING PHYSICIAN: Beather Arbour  CHIEF COMPLAINT:   Chief Complaint  Patient presents with  . Rectal Bleeding    HISTORY OF PRESENT ILLNESS:  Kelsey Peterson  is a 59 y.o. female who presents with rectal bleeding. Patient was recently here in the hospital, and during that stay had an EGD which was normal, and a colonoscopy during which she had 2 polyps removed. She was on home today and was having normal bowel movements, and then this afternoon began to have dark blood mixed with her stool, and then some frank dark bleeding from her rectum. She came to the ED for evaluation. Her hemoglobin is stable, though she does have chronic anemia due to her end-stage renal disease. She denies any abdominal pain, nausea, vomiting, diarrhea prior to these bleeding episodes. Hospitalists were called for admission for evaluation of her rectal bleeding. Of note, she is a dialysis patient.  PAST MEDICAL HISTORY:   Past Medical History  Diagnosis Date  . COPD (chronic obstructive pulmonary disease)   . ESRD on hemodialysis   . DM2 (diabetes mellitus, type 2)   . Hepatitis C   . Migraine with aura   . HOH (hard of hearing)   . Mitral regurgitation     a. echo 06/2014: EF 60%, no WMA, GR1DD, mild AI, calcified mitral annulus, mod MR, no atrial septal defect or patent foramen ovale   . Cognitive impairment   . CAD (coronary artery disease)   . HTN (hypertension)     PAST SURGICAL HISTORY:   Past Surgical History  Procedure Laterality Date  . Dialysis fistula creation    . Tubal ligation      SOCIAL HISTORY:   History  Substance Use Topics  . Smoking status: Never Smoker   . Smokeless tobacco: Never Used  . Alcohol Use: No    FAMILY HISTORY:    Family History  Problem Relation Age of Onset  . Heart disease Mother   . Hypertension Mother     DRUG ALLERGIES:   Allergies  Allergen Reactions  . Contrast Media [Iodinated Diagnostic Agents]   . Morphine And Related Hives  . Other Rash    Blood Pressure Pill (Pt doesn't remember name)     MEDICATIONS AT HOME:   Prior to Admission medications   Medication Sig Start Date End Date Taking? Authorizing Provider  acetaminophen (TYLENOL) 325 MG tablet Take 650 mg by mouth every 6 (six) hours as needed for moderate pain or fever.    Historical Provider, MD  albuterol (PROVENTIL HFA;VENTOLIN HFA) 108 (90 BASE) MCG/ACT inhaler Inhale 2 puffs into the lungs every 4 (four) hours as needed for wheezing or shortness of breath.     Historical Provider, MD  amiodarone (PACERONE) 200 MG tablet Take 1 tablet (200 mg total) by mouth 2 (two) times daily. 07/10/14   Gladstone Lighter, MD  amLODipine (NORVASC) 5 MG tablet Take 5 mg by mouth daily.    Historical Provider, MD  calcium acetate (PHOSLO) 667 MG capsule Take 1 capsule (667 mg total) by mouth 3 (three) times daily with meals. 07/10/14   Gladstone Lighter, MD  calcium carbonate (TUMS - DOSED IN MG ELEMENTAL CALCIUM) 500 MG chewable tablet Chew 1 tablet by mouth daily.  Historical Provider, MD  cinacalcet (SENSIPAR) 60 MG tablet Take 60 mg by mouth daily. With largest meal    Historical Provider, MD  clopidogrel (PLAVIX) 75 MG tablet Take 75 mg by mouth daily. 06/27/14 07/27/14  Historical Provider, MD  esomeprazole (NEXIUM) 40 MG capsule Take 40 mg by mouth daily.     Historical Provider, MD  ferrous sulfate 325 (65 FE) MG tablet Take 325 mg by mouth daily.    Historical Provider, MD  folic acid-vitamin b complex-vitamin c-selenium-zinc (DIALYVITE) 3 MG TABS tablet Take 1 tablet by mouth daily.    Historical Provider, MD  furosemide (LASIX) 80 MG tablet Take 80 mg by mouth daily. Patient takes on non dialysis days.    Historical Provider, MD   isosorbide mononitrate (IMDUR) 30 MG 24 hr tablet Take 30 mg by mouth daily.    Historical Provider, MD  Lidocaine-Prilocaine, Bulk, 2.7-2.5 % CREA 1 application by Other route as needed (for tx). Apply to site 20-45 minutes before tx.    Historical Provider, MD  metoprolol tartrate (LOPRESSOR) 25 MG tablet Take 0.5 tablets (12.5 mg total) by mouth 2 (two) times daily. 07/10/14   Gladstone Lighter, MD  traMADol (ULTRAM) 50 MG tablet Take 50 mg by mouth 2 (two) times daily as needed for moderate pain.    Historical Provider, MD  vancomycin 1,000 mg in sodium chloride 0.9 % 250 mL Inject 1,000 mg into the vein Every Tuesday,Thursday,and Saturday with dialysis. 07/10/14 07/20/14  Gladstone Lighter, MD    REVIEW OF SYSTEMS:  Review of Systems  Constitutional: Negative for fever, chills, weight loss and malaise/fatigue.  HENT: Negative for ear pain, hearing loss and tinnitus.   Eyes: Negative for blurred vision, double vision, pain and redness.  Respiratory: Negative for cough, hemoptysis and shortness of breath.   Cardiovascular: Negative for chest pain, palpitations, orthopnea and leg swelling.  Gastrointestinal: Positive for blood in stool and melena. Negative for nausea, vomiting, abdominal pain, diarrhea and constipation.  Genitourinary: Negative for dysuria, frequency and hematuria.  Musculoskeletal: Negative for back pain, joint pain and neck pain.  Skin:       No acne, rash, or lesions  Neurological: Negative for dizziness, tremors, focal weakness and weakness.  Endo/Heme/Allergies: Negative for polydipsia. Does not bruise/bleed easily.  Psychiatric/Behavioral: Negative for depression. The patient is not nervous/anxious and does not have insomnia.      VITAL SIGNS:   Filed Vitals:   07/13/14 2220 07/14/14 0245 07/14/14 0252  BP: 180/95 185/94 190/107  Pulse: 90 75 80  Temp: 97.9 F (36.6 C) 97.5 F (36.4 C) 97.7 F (36.5 C)  TempSrc: Oral Oral Oral  Resp: 20 18 21   Height: 5\' 2"   (1.575 m)    Weight: 89.812 kg (198 lb)    SpO2: 97% 100% 99%   Wt Readings from Last 3 Encounters:  07/13/14 89.812 kg (198 lb)  07/10/14 87.2 kg (192 lb 3.9 oz)  06/22/14 90.629 kg (199 lb 12.8 oz)    PHYSICAL EXAMINATION:  Physical Exam  Constitutional: She is oriented to person, place, and time. She appears well-developed and well-nourished. No distress.  HENT:  Head: Normocephalic and atraumatic.  Mouth/Throat: Oropharynx is clear and moist.  Eyes: Conjunctivae and EOM are normal. Pupils are equal, round, and reactive to light. No scleral icterus.  Neck: Normal range of motion. Neck supple. No JVD present. No thyromegaly present.  Cardiovascular: Normal rate, regular rhythm and intact distal pulses.  Exam reveals no gallop and no friction rub.  No murmur heard. Respiratory: Effort normal and breath sounds normal. No respiratory distress. She has no wheezes. She has no rales.  GI: Soft. Bowel sounds are normal. She exhibits no distension. There is no tenderness.  Musculoskeletal: Normal range of motion. She exhibits no edema.  No arthritis, no gout  Lymphadenopathy:    She has no cervical adenopathy.  Neurological: She is alert and oriented to person, place, and time. No cranial nerve deficit.  No dysarthria, no aphasia  Skin: Skin is warm and dry. No rash noted. No erythema.  Psychiatric: She has a normal mood and affect. Her behavior is normal. Judgment and thought content normal.    LABORATORY PANEL:   CBC  Recent Labs Lab 07/13/14 2256  WBC 10.9  HGB 8.5*  HCT 26.4*  PLT 299   ------------------------------------------------------------------------------------------------------------------  Chemistries   Recent Labs Lab 07/13/14 2256  NA 142  K 3.8  CL 103  CO2 30  GLUCOSE 92  BUN 12  CREATININE 5.50*  CALCIUM 7.5*  AST 38  ALT 33  ALKPHOS 66  BILITOT 0.9    ------------------------------------------------------------------------------------------------------------------  Cardiac Enzymes No results for input(s): TROPONINI in the last 168 hours. ------------------------------------------------------------------------------------------------------------------  RADIOLOGY:  No results found.  EKG:   Orders placed or performed during the hospital encounter of 07/03/14  . EKG 12-Lead  . EKG 12-Lead  . EKG 12-Lead  . EKG 12-Lead  . EKG 12-Lead  . EKG 12-Lead  . EKG    IMPRESSION AND PLAN:  Principal Problem:   GI bleed - unclear if there is any correlation to her recent colonoscopy and polyp removal. Bleeding seems to be a lower bleed at this point however given the fact that she's passing dark blood we will start a pantoprazole drip, and consult GI as this patient likely needs repeat colonoscopy. Currently hemodynamically stable, with a stable hemoglobin. Will get every 6 hours hemoglobins to monitor the trend. Type and screen sent, and patient consented for and agrees to receiving a blood transfusion if it becomes necessary. Active Problems:   End stage renal disease on dialysis - nephrology consult for hemodialysis support. Continue home meds.   COPD (chronic obstructive pulmonary disease) - continue home inhaler for this.   HTN (hypertension) - continue home medications for this. Her blood pressure somewhat elevated right now so we'll also add on some when necessary antihypertensives.   CAD (coronary artery disease) - chronic and stable at this time, continue home medications, except for her Plavix which we will hold this time due to her bleeding.   GERD (gastroesophageal reflux disease) - pantoprazole drip for now as above, continue home dose PPI by mouth afterwards.  All the records are reviewed and case discussed with ED provider. Management plans discussed with the patient and/or family.  DVT PROPHYLAXIS: mechanical  only  ADMISSION STATUS: Inpatient  CODE STATUS: Full  TOTAL TIME TAKING CARE OF THIS PATIENT: 40 minutes.    Niley Helbig Roy 07/14/2014, 4:07 AM  [facility]  Office  [phone]  CC: Primary care physician; No primary care provider on file.

## 2014-07-14 NOTE — ED Notes (Signed)
Pt due to have dialysis today, last dialysis on this past Thursday.

## 2014-07-14 NOTE — Progress Notes (Signed)
Subjective:  Pt readmitted with lower GI bleeding. Has been having dark looking stool and blood per rectum. Pt seen during HD today, limited UF today.   Objective:  Vital signs in last 24 hours:  Temp:  [97.5 F (36.4 C)-97.9 F (36.6 C)] 97.6 F (36.4 C) (06/25 1145) Pulse Rate:  [73-101] 73 (06/25 1230) Resp:  [15-25] 24 (06/25 1230) BP: (174-190)/(90-108) 181/96 mmHg (06/25 1230) SpO2:  [96 %-100 %] 99 % (06/25 1145) Weight:  [89.8 kg (197 lb 15.6 oz)-89.812 kg (198 lb)] 89.8 kg (197 lb 15.6 oz) (06/25 1145)  Weight change:  Filed Weights   07/13/14 2220 07/14/14 1145  Weight: 89.812 kg (198 lb) 89.8 kg (197 lb 15.6 oz)    Intake/Output: I/O last 3 completed shifts: In: 152.5 [I.V.:52.5; IV Piggyback:100] Out: 1 [Stool:1]     Physical Exam: General: NAD  HEENT Anicteric, moist mucus membranes  Neck supple  Pulm/lungs CTAB normal effort  CVS/Heart Irregular, no rub  Abdomen:  Soft, Non tender, BS present  Extremities: Trace edema  Neurologic: Awake, alert, follows commands  Skin: No acute rashes  Access: Rt IJ PC       Basic Metabolic Panel:  Recent Labs Lab 07/08/14 1651 07/09/14 0421 07/10/14 0339 07/13/14 2256 07/14/14 0552  NA 138 135 136 142 141  K 4.4 4.0 3.9 3.8 4.3  CL 97* 98* 95* 103 104  CO2 30 28 28 30 26   GLUCOSE 93 87 84 92 76  BUN 20 21* 27* 12 12  CREATININE 4.81* 5.38* 6.59* 5.50* 5.64*  CALCIUM 6.9* 6.4* 6.6* 7.5* 7.5*  PHOS 2.6  --   --   --   --      CBC:  Recent Labs Lab 07/08/14 1651 07/09/14 0421 07/10/14 0339 07/13/14 2256 07/14/14 0552  WBC 8.0 7.4 7.8 10.9 9.2  HGB 8.5* 8.0* 8.4* 8.5* 8.6*  HCT 26.5* 24.7* 25.4* 26.4* 26.7*  MCV 79.6* 79.2* 78.8* 80.8 81.7  PLT 251 241 249 299 297      Microbiology: Results for orders placed or performed during the hospital encounter of 07/14/14  MRSA PCR Screening     Status: Abnormal   Collection Time: 07/14/14  6:00 AM  Result Value Ref Range Status   MRSA by PCR  POSITIVE (A) NEGATIVE Final    Comment:        The GeneXpert MRSA Assay (FDA approved for NASAL specimens only), is one component of a comprehensive MRSA colonization surveillance program. It is not intended to diagnose MRSA infection nor to guide or monitor treatment for MRSA infections. CRITICAL RESULT CALLED TO, READ BACK BY AND VERIFIED WITH: TAYLOR BALEY ON 07/14/14 AT 0730 BY JEF   C difficile quick scan w PCR reflex ([place] only)     Status: None   Collection Time: 07/14/14  9:00 AM  Result Value Ref Range Status   C Diff antigen NEGATIVE  Final   C Diff toxin NEGATIVE  Final   C Diff interpretation Negative for C. difficile  Final    Coagulation Studies:  Recent Labs  07/13/14 2256  LABPROT 13.5  INR 1.01    Urinalysis: No results for input(s): COLORURINE, LABSPEC, PHURINE, GLUCOSEU, HGBUR, BILIRUBINUR, KETONESUR, PROTEINUR, UROBILINOGEN, NITRITE, LEUKOCYTESUR in the last 72 hours.  Invalid input(s): APPERANCEUR    Imaging: No results found.   Medications:     . amiodarone  200 mg Oral BID  . amLODipine  5 mg Oral Daily  . calcium acetate  667 mg  Oral TID WC  . cinacalcet  60 mg Oral Q breakfast  . ferrous sulfate  325 mg Oral Daily  . [START ON 07/15/2014] furosemide  80 mg Oral Q M,W,F,Su-1800  . isosorbide mononitrate  30 mg Oral Daily  . metoprolol tartrate  12.5 mg Oral BID  . multivitamin  1 tablet Oral QHS  . pantoprazole  40 mg Oral Daily  . sodium chloride  3 mL Intravenous Q12H   sodium chloride, sodium chloride, acetaminophen, albuterol, alteplase, heparin, lidocaine (PF), lidocaine-prilocaine, ondansetron **OR** ondansetron (ZOFRAN) IV, pentafluoroprop-tetrafluoroeth, traMADol  Assessment/ Plan:  59 y.o. 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  1.  ESRD on HD TTHS:  - Pt seen during HD, tolerating well, limited UF to be performed today.  2  AOCKD/acute gastrointestinal hemorrhage (K92.2):   Now with lower GI bleeding, hospitalist/GI following, would transfuse as necessary, will start back on epogen 10000 units IV with HD.  3.  SHPTH of renal origin: continue phoslo for now, periodically monitor phos.     LOS: 0 Damani Rando 6/25/20161:19 PM

## 2014-07-14 NOTE — Progress Notes (Signed)
[facility] [place] NAME: Kelsey Peterson    MR#:  [id number]  DATE OF BIRTH:  [birth year]  SUBJECTIVE:  Came in with dark red stools. No abd pain. Impaired hearing  REVIEW OF SYSTEMS:   Review of Systems  Constitutional: Negative for fever, chills and weight loss.  HENT: Negative for ear discharge, ear pain and nosebleeds.   Eyes: Negative for blurred vision, pain and discharge.  Respiratory: Negative for sputum production, shortness of breath, wheezing and stridor.   Cardiovascular: Negative for chest pain, palpitations, orthopnea and PND.  Gastrointestinal: Positive for blood in stool. Negative for nausea, vomiting, abdominal pain and diarrhea.  Genitourinary: Negative for urgency and frequency.  Musculoskeletal: Negative for back pain and joint pain.  Neurological: Negative for sensory change, speech change, focal weakness and weakness.  Psychiatric/Behavioral: Negative for depression and hallucinations. The patient is not nervous/anxious.   All other systems reviewed and are negative.  Tolerating Diet:yes  DRUG ALLERGIES:   Allergies  Allergen Reactions  . Contrast Media [Iodinated Diagnostic Agents]   . Morphine And Related Hives  . Other Rash    Blood Pressure Pill (Pt doesn't remember name)     VITALS:  Blood pressure 173/97, pulse 80, temperature 97.5 F (36.4 C), temperature source Oral, resp. rate 18, height 5\' 2"  (1.575 m), weight 90.22 kg (198 lb 14.4 oz), SpO2 97 %.  PHYSICAL EXAMINATION:   Physical Exam  GENERAL:  59 y.o.-year-old patient lying in the bed with no acute distress.  EYES: Pupils equal, round, reactive to light and accommodation. No scleral icterus. Extraocular muscles intact.  HEENT: Head atraumatic, normocephalic. Oropharynx and nasopharynx clear.  NECK:  Supple, no jugular venous distention. No thyroid enlargement, no tenderness.  LUNGS: Normal breath sounds bilaterally, no wheezing,  rales, rhonchi. No use of accessory muscles of respiration.  CARDIOVASCULAR: S1, S2 normal. No murmurs, rubs, or gallops.  ABDOMEN: Soft, nontender, nondistended. Bowel sounds present. No organomegaly or mass.  EXTREMITIES: No cyanosis, clubbing or edema b/l.    NEUROLOGIC: Cranial nerves II through XII are intact. No focal Motor or sensory deficits b/l.   PSYCHIATRIC: The patient is alert and oriented x 3.  SKIN: No obvious rash, lesion, or ulcer.    LABORATORY PANEL:   CBC  Recent Labs Lab 07/14/14 0552  WBC 9.2  HGB 8.6*  HCT 26.7*  PLT 297    Chemistries   Recent Labs Lab 07/13/14 2256 07/14/14 0552  NA 142 141  K 3.8 4.3  CL 103 104  CO2 30 26  GLUCOSE 92 76  BUN 12 12  CREATININE 5.50* 5.64*  CALCIUM 7.5* 7.5*  AST 38  --   ALT 33  --   ALKPHOS 66  --   BILITOT 0.9  --     Cardiac Enzymes No results for input(s): TROPONINI in the last 168 hours.  RADIOLOGY:  No results found.   ASSESSMENT AND PLAN:  1.Lower GI bleed likely from recent polpypectomy on June 20th with 5 polyp removal. Pt resumed back her plavix as per instructions - Currently hemodynamically stable, with a stable hemoglobin.  -no indication for transfusion -seen by Dr Tiffany Kocher.conservative mnx  2. End stage renal disease on dialysis - nephrology consult for hemodialysis support. Continue home meds.  3. COPD (chronic obstructive pulmonary disease) - continue home inhalers  4. HTN (hypertension) - continue home medications for this.  5.(coronary artery disease) - chronic and stable at this time, continue home  medications, except for her Plavix which we will hold this time due to her bleeding.  6. GERD cont po ppi. No need ofr IV ppi. Recent EGD normal  discussed with Dr Tiffany Kocher  plans discussed with the patient, family and they are in agreement.  CODE STATUS: Full  DVT Prophylaxis: no antiplt due to GI bleeding, TEDS  TOTAL TIME TAKING CARE OF THIS PATIENT: 35inutes.  >50%  time spent on counselling and coordination of care  POSSIBLE D/C IN 1-2YS, DEPENDING ON CLINICAL CONDITION.   Manuela Halbur M.D on 07/14/2014 at 5:19 PM  Between 7am to 6pm - Pager - [phone]  After 6pm go to [email or web address] - password [email or web address] [facility]  [facility]  Office  [phone]  CC: Primary care physician; No primary care provider on file.

## 2014-07-14 NOTE — Consult Note (Signed)
GI Inpatient Consult Note  Reason for Consult: GI Bleed  Attending Requesting Consult: Dr. Jannifer Franklin  History of Present Illness: Kelsey Peterson is a 59 y.o. female reports that she started having bloody diarrhea last night at 8pm.  They have been continuous since then.  She reports being very tired and weak and says she have had plenty of sleep.  She had a polypectomy on 07/09/2014. Please see note below: Colonoscopy from 07/09/14 - Diverticulosis in the sigmoid colon and in the descending colon. - Two small polyps in the cecum. Resected and retrieved. - Three small polyps in the ascending colon. Resected and retrieved. - The examination was otherwise normal. Impression: - Discharge patient to home. - Await pathology results. - The findings and recommendations were discussed with the patient. - Repeat colonoscopy in 3 years for surveillance based on pathology results. - Hold plavix 3 more days.  EGD from 07/09/14 Normal esophagus. - Normal stomach. - Normal examined duodenum. - No specimens collected.  - Observe patient's clinical course. - The findings and recommendations were discussed with the patient. - Proceed to colonoscopy  During her last admission her Hgb fell to 5.8 and she was discharged on the 21st with 7.8-8.0. She is unsure of what medications she takes at home "because I take too many."   Past Medical History:  Past Medical History  Diagnosis Date  . COPD (chronic obstructive pulmonary disease)   . ESRD on hemodialysis   . DM2 (diabetes mellitus, type 2)   . Hepatitis C   . Migraine with aura   . HOH (hard of hearing)   . Mitral regurgitation     a. echo 06/2014: EF 60%, no WMA, GR1DD, mild AI, calcified mitral annulus, mod MR, no atrial septal defect or patent foramen ovale   . Cognitive impairment   . CAD (coronary artery disease)   . HTN (hypertension)     Problem List: Patient Active Problem List   Diagnosis Date Noted  . GI bleed 07/14/2014  . GERD  (gastroesophageal reflux disease) 07/14/2014  . COPD (chronic obstructive pulmonary disease) 07/14/2014  . HTN (hypertension) 07/14/2014  . CAD (coronary artery disease) 07/14/2014  . End stage renal disease on dialysis   . Supraventricular tachycardia   . NSVT (nonsustained ventricular tachycardia)   . Pulmonary edema 07/03/2014  . Anemia 07/03/2014  . Pain in the chest   . Diarrhea   . NSTEMI (non-ST elevated myocardial infarction) 06/21/2014    Past Surgical History: Past Surgical History  Procedure Laterality Date  . Dialysis fistula creation    . Tubal ligation      Allergies: Allergies  Allergen Reactions  . Contrast Media [Iodinated Diagnostic Agents]   . Morphine And Related Hives  . Other Rash    Blood Pressure Pill (Pt doesn't remember name)     Home Medications: Prescriptions prior to admission  Medication Sig Dispense Refill Last Dose  . acetaminophen (TYLENOL) 325 MG tablet Take 650 mg by mouth every 6 (six) hours as needed for moderate pain or fever.   Past Month at Unknown time  . albuterol (PROVENTIL HFA;VENTOLIN HFA) 108 (90 BASE) MCG/ACT inhaler Inhale 2 puffs into the lungs every 4 (four) hours as needed for wheezing or shortness of breath.    Past Month at Unknown time  . amiodarone (PACERONE) 200 MG tablet Take 1 tablet (200 mg total) by mouth 2 (two) times daily. 60 tablet 1 07/13/2014 at Unknown time  . amLODipine (NORVASC) 5 MG tablet  Take 5 mg by mouth daily.   07/13/2014 at Unknown time  . calcium carbonate (TUMS - DOSED IN MG ELEMENTAL CALCIUM) 500 MG chewable tablet Chew 1 tablet by mouth daily.   07/13/2014 at Unknown time  . clopidogrel (PLAVIX) 75 MG tablet Take 75 mg by mouth daily.   07/13/2014 at Unknown time  . esomeprazole (NEXIUM) 40 MG capsule Take 40 mg by mouth daily.    07/13/2014 at Unknown time  . ferrous sulfate 325 (65 FE) MG tablet Take 325 mg by mouth daily.   07/13/2014 at Unknown time  . folic acid-vitamin b complex-vitamin  c-selenium-zinc (DIALYVITE) 3 MG TABS tablet Take 1 tablet by mouth daily.   07/13/2014 at Unknown time  . furosemide (LASIX) 80 MG tablet Take 80 mg by mouth daily. Patient takes on non dialysis days.   07/13/2014 at Unknown time  . isosorbide mononitrate (IMDUR) 30 MG 24 hr tablet Take 30 mg by mouth daily.   07/13/2014 at Unknown time  . Lidocaine-Prilocaine, Bulk, 2.5-0.5 % CREA 1 application by Other route as needed (for tx). Apply to site 20-45 minutes before tx.   Past Week at Unknown time  . metoprolol tartrate (LOPRESSOR) 25 MG tablet Take 0.5 tablets (12.5 mg total) by mouth 2 (two) times daily. 30 tablet 1 07/13/2014 at Unknown time  . traMADol (ULTRAM) 50 MG tablet Take 50 mg by mouth 2 (two) times daily as needed for moderate pain.   Past Week at Unknown time  . vancomycin 1,000 mg in sodium chloride 0.9 % 250 mL Inject 1,000 mg into the vein Every Tuesday,Thursday,and Saturday with dialysis. 5000 mg 0 Past Week at Unknown time  . calcium acetate (PHOSLO) 667 MG capsule Take 1 capsule (667 mg total) by mouth 3 (three) times daily with meals. (Patient not taking: Reported on 07/14/2014) 90 capsule 1 Not Taking at Unknown time  . cinacalcet (SENSIPAR) 60 MG tablet Take 60 mg by mouth daily. With largest meal   Not Taking at Unknown time   Home medication reconciliation was completed with the patient.   Scheduled Inpatient Medications:   . amiodarone  200 mg Oral BID  . amLODipine  5 mg Oral Daily  . calcium acetate  667 mg Oral TID WC  . cinacalcet  60 mg Oral Q breakfast  . ferrous sulfate  325 mg Oral Daily  . [START ON 07/15/2014] furosemide  80 mg Oral Q M,W,F,Su-1800  . isosorbide mononitrate  30 mg Oral Daily  . metoprolol tartrate  12.5 mg Oral BID  . multivitamin  1 tablet Oral QHS  . pantoprazole  40 mg Oral Daily  . sodium chloride  3 mL Intravenous Q12H    Continuous Inpatient Infusions:     PRN Inpatient Medications:  acetaminophen, albuterol, ondansetron **OR**  ondansetron (ZOFRAN) IV, traMADol  Family History: family history includes Heart disease in her mother; Hypertension in her mother.    Social History:   reports that she has never smoked. She has never used smokeless tobacco. She reports that she does not drink alcohol or use illicit drugs.   Review of Systems: Constitutional: Weight is stable.  Eyes: No changes in vision. ENT: No oral lesions, sore throat.  GI: see HPI.  Heme/Lymph: No easy bruising.  CV: No chest pain.  GU: No hematuria.  Integumentary: No rashes.  Neuro: No headaches.  Psych: No depression/anxiety.  Endocrine: No heat/cold intolerance.  Allergic/Immunologic: No urticaria.  Resp: No cough, SOB.  Musculoskeletal: No joint swelling.  Physical Examination: BP 180/93 mmHg  Pulse 101  Temp(Src) 97.7 F (36.5 C) (Oral)  Resp 18  Ht 5\' 2"  (1.575 m)  Wt 89.812 kg (198 lb)  BMI 36.21 kg/m2  SpO2 96% Gen: NAD, alert and oriented x 4, patient was in hemodialysis during examination.  Post it notes were used to communicate. HEENT: PEERLA, EOMI, Neck: supple, no JVD or thyromegaly Chest: CTA bilaterally, no wheezes, crackles, or other adventitious sounds CV: RRR, no m/g/c/r Abd: soft, NT, ND, +BS in all four quadrants; no HSM, guarding, ridigity, or rebound tenderness Ext: no edema, well perfused with 2+ pulses, Skin: no rash or lesions noted Lymph: no LAD Rectal exam: She was currently having a BM, sample was burgundy and thin, heme positive.   Data: Lab Results  Component Value Date   WBC 9.2 07/14/2014   HGB 8.6* 07/14/2014   HCT 26.7* 07/14/2014   MCV 81.7 07/14/2014   PLT 297 07/14/2014    Recent Labs Lab 07/10/14 0339 07/13/14 2256 07/14/14 0552  HGB 8.4* 8.5* 8.6*   Lab Results  Component Value Date   NA 141 07/14/2014   K 4.3 07/14/2014   CL 104 07/14/2014   CO2 26 07/14/2014   BUN 12 07/14/2014   CREATININE 5.64* 07/14/2014   Lab Results  Component Value Date   ALT 33  07/13/2014   AST 38 07/13/2014   ALKPHOS 66 07/13/2014   BILITOT 0.9 07/13/2014    Recent Labs Lab 07/13/14 2256  INR 1.01   Assessment/Plan: Ms. Mullane is a 59 y.o. female with a GI bleed  Recommendations: We suspect that this bleeding is from the recent polypectomy.  These will generally quit bleeding on their own.  We recommend that the Hgb is continued to be monitored at least twice a day.  We recommend to hold her iron at this time.  We recommend total bed rest as well.  We also recommend that she continue on the clear liquid diet. We agree with the Protonix and continue to hold the Plavix. We will continue to follow with you. Thank you for the consult. Please call with questions or concerns.  Salvadore Farber, PA-C  I personally performed these services/

## 2014-07-15 ENCOUNTER — Encounter
Admission: EM | Disposition: A | Discharge status: Home / Self Care | Payer: Self-pay | Source: Home / Self Care | Attending: Internal Medicine

## 2014-07-15 ENCOUNTER — Inpatient Hospital Stay: Payer: Medicare Other | Admitting: Anesthesiology

## 2014-07-15 ENCOUNTER — Encounter
Admission: EM | Disposition: A | Discharge status: Home / Self Care | Payer: Medicare Other | Source: Home / Self Care | Attending: Internal Medicine

## 2014-07-15 HISTORY — PX: COLONOSCOPY: SHX5424

## 2014-07-15 LAB — PARATHYROID HORMONE, INTACT (NO CA): PTH: 346 pg/mL — AB (ref 15–65)

## 2014-07-15 LAB — HEMOGLOBIN
HEMOGLOBIN: 8.3 g/dL — AB (ref 12.0–16.0)
HEMOGLOBIN: 9.3 g/dL — AB (ref 12.0–16.0)
Hemoglobin: 7.4 g/dL — ABNORMAL LOW (ref 12.0–16.0)

## 2014-07-15 LAB — PREPARE RBC (CROSSMATCH)

## 2014-07-15 LAB — POTASSIUM: Potassium: 3.8 mmol/L (ref 3.5–5.1)

## 2014-07-15 SURGERY — COLONOSCOPY
Anesthesia: General

## 2014-07-15 SURGERY — COLONOSCOPY
Anesthesia: Monitor Anesthesia Care

## 2014-07-15 MED ORDER — PEG 3350-KCL-NA BICARB-NACL 420 G PO SOLR
4000.0000 mL | Freq: Once | ORAL | Status: AC
  Administered 2014-07-15: 4000 mL via ORAL
  Filled 2014-07-15: qty 4000

## 2014-07-15 MED ORDER — SODIUM CHLORIDE 0.9 % IV SOLN
INTRAVENOUS | Status: DC
  Administered 2014-07-15: 17:00:00 via INTRAVENOUS

## 2014-07-15 MED ORDER — SODIUM CHLORIDE 0.9 % IV SOLN: Freq: Once | INTRAVENOUS | Status: DC

## 2014-07-15 MED ORDER — PROPOFOL INFUSION 10 MG/ML OPTIME
INTRAVENOUS | Status: DC | PRN
  Administered 2014-07-15: 75 ug/kg/min via INTRAVENOUS

## 2014-07-15 MED ORDER — PIPERACILLIN-TAZOBACTAM 3.375 G IVPB: 3.3750 g | Freq: Once | INTRAVENOUS | Status: DC

## 2014-07-15 MED ORDER — PROPOFOL 10 MG/ML IV BOLUS
INTRAVENOUS | Status: DC | PRN
  Administered 2014-07-15: 50 mg via INTRAVENOUS

## 2014-07-15 MED ORDER — PEG 3350-KCL-NA BICARB-NACL 420 G PO SOLR
1000.0000 mL | Freq: Once | ORAL | Status: DC
  Filled 2014-07-15: qty 4000

## 2014-07-15 MED ORDER — PIPERACILLIN-TAZOBACTAM 3.375 G IVPB 30 MIN
3.3750 g | Freq: Once | INTRAVENOUS | Status: AC
  Administered 2014-07-15: 3.375 g via INTRAVENOUS
  Filled 2014-07-15: qty 50

## 2014-07-15 NOTE — Op Note (Signed)
[facility] Gastroenterology Patient Name: Kelsey Peterson Procedure Date: 07/15/2014 3:51 PM MRN: [id number] Account #: [id number] Date of Birth: [birth year] Admit Type: Inpatient Age: 59 Room: [facility] ENDO ROOM 1 Gender: Female Note Status: Finalized Procedure:         Colonoscopy Indications:       Treatment of bleeding from polypectomy site Providers:         Manya Silvas, MD Referring MD:      No Local Md, MD (Referring MD) Medicines:         Propofol per Anesthesia Complications:     No immediate complications. Procedure:         Pre-Anesthesia Assessment:                    - After reviewing the risks and benefits, the patient was                     deemed in satisfactory condition to undergo the procedure.                    After obtaining informed consent, the colonoscope was                     passed under direct vision. Throughout the procedure, the                     patient's blood pressure, pulse, and oxygen saturations                     were monitored continuously. The Colonoscope was                     introduced through the anus and advanced to the the cecum,                     identified by appendiceal orifice and ileocecal valve. The                     colonoscopy was performed without difficulty. The patient                     tolerated the procedure well. The quality of the bowel                     preparation was excellent. Findings:      4 sites were seen that had fresh clots adherent to ulcerated post       polypectomy sites. None were actively bleeding but due to the need for       dialysis and low dose heparin wih his i went ahead and placed 3 clips on       each of 3 sites in the ascending colon and 4 clips on the cecal site       with good results and no active bleeding anywhere. Two miniscule polyps       were seen but not removed, probably 40mm each.      Multiple small-mouthed diverticula were found in the sigmoid  colon.      Internal hemorrhoids were found during endoscopy. The hemorrhoids were       small and Grade I (internal hemorrhoids that do not prolapse). Impression:        - Diverticulosis in the sigmoid colon.                    -  Internal hemorrhoids.                    - No specimens collected. Recommendation:    - The findings and recommendations were discussed with the                     patient. Start clear liquid diet and advance to routine                     diet tomorrow. Manya Silvas, MD 07/15/2014 5:57:33 PM This report has been signed electronically. Number of Addenda: 0 Note Initiated On: 07/15/2014 3:51 PM Scope Withdrawal Time: 0 hours 24 minutes 19 seconds  Total Procedure Duration: 0 hours 36 minutes 48 seconds       [facility]

## 2014-07-15 NOTE — Anesthesia Postprocedure Evaluation (Signed)
  Anesthesia Post-op Note  Patient: Kelsey Peterson  Procedure(s) Performed: Procedure(s): COLONOSCOPY (N/A)  Anesthesia type:General  Patient location: PACU  Post pain: Pain level controlled  Post assessment: Post-op Vital signs reviewed, Patient's Cardiovascular Status Stable, Respiratory Function Stable, Patent Airway and No signs of Nausea or vomiting  Post vital signs: Reviewed and stable  Last Vitals:  Filed Vitals:   07/15/14 1823  BP: 184/82  Pulse: 64  Temp:   Resp: 16    Level of consciousness: awake, alert  and patient cooperative  Complications: No apparent anesthesia complications

## 2014-07-15 NOTE — Anesthesia Preprocedure Evaluation (Signed)
Anesthesia Evaluation  Patient identified by MRN, date of birth, ID band Patient awake    Reviewed: Allergy & Precautions, NPO status , Patient's Chart, lab work & pertinent test results, reviewed documented beta blocker date and time   History of Anesthesia Complications Negative for: history of anesthetic complications  Airway Mallampati: II  TM Distance: >3 FB Neck ROM: Full    Dental  (+) Edentulous Upper   Pulmonary COPD COPD inhaler,    + decreased breath sounds      Cardiovascular Exercise Tolerance: Poor hypertension, Pt. on medications and Pt. on home beta blockers + CAD Normal cardiovascular examMR Rhythm:Regular Rate:Normal     Neuro/Psych  Headaches, negative psych ROS   GI/Hepatic GERD-  Medicated,(+) Hepatitis -  Endo/Other  diabetes, Type 2  Renal/GU Dialysis and ESRFRenal disease  negative genitourinary   Musculoskeletal negative musculoskeletal ROS (+)   Abdominal   Peds negative pediatric ROS (+)  Hematology negative hematology ROS (+) anemia ,   Anesthesia Other Findings   Reproductive/Obstetrics negative OB ROS                             Anesthesia Physical Anesthesia Plan  ASA: III  Anesthesia Plan: General   Post-op Pain Management:    Induction: Intravenous  Airway Management Planned: Nasal Cannula  Additional Equipment:   Intra-op Plan:   Post-operative Plan:   Informed Consent: I have reviewed the patients History and Physical, chart, labs and discussed the procedure including the risks, benefits and alternatives for the proposed anesthesia with the patient or authorized representative who has indicated his/her understanding and acceptance.     Plan Discussed with: CRNA and Surgeon  Anesthesia Plan Comments:         Anesthesia Quick Evaluation

## 2014-07-15 NOTE — Consult Note (Signed)
Pt with colonoscopy and polypectomies 6 days ago and came in to hospital with delayed bleeding in colon likely from a polypectomy site.  Can;t be sure does not have diverticular bleeding.  Pt was on Plavix which was continued due to concern about possible effects on dialysis catheter and clotting of the catheter.  She got blood yesterday and again today.  Will need to do colonoscopy today since she is continuing to bleed. See Claudie Leach note. Plan for colonoscopy at about 5pm today.

## 2014-07-15 NOTE — Consult Note (Signed)
Pt had adherent clots to 4 different sites where previous polyps had been removed but colonoscopy showed  no active bleeding  Several clips (13 in all ) were applied to the sites with good success.  She should very likely have no further bleeding.  Start clear liquids and advance tomorrow to usual diet.  She was clearly bleeding up to the time of the prep.

## 2014-07-15 NOTE — Progress Notes (Addendum)
Patient received one unit of blood.  It was started at 2335 and ended at 94,  Although this is not what is stated in the completed time in the computer. There were some computer issues with the transfusion. Tolerated the blood well, no sighs symptoms of distress.

## 2014-07-15 NOTE — Care Management Note (Signed)
Case Management Note  Patient Details  Name: Kelsey Peterson MRN: [id number] Date of Birth: April 15, 1955  Subjective/Objective:           Kelsey Peterson is an open client of [facility] (confirmed with Malachy Mood at [place]). Continues to have blood in her stool and low Hgb. Today Hgb=7.2 after receiving 2 units PRBCs yesterday. Scheduled for a colonoscopy this afternoon to try to locate the source of her bleeding.          Action/Plan:   Expected Discharge Date:                  Expected Discharge Plan:     In-House Referral:     Discharge planning Services     Post Acute Care Choice:    Choice offered to:     DME Arranged:    DME Agency:     HH Arranged:    [place] Agency:     Status of Service:     Medicare Important Message Given:    Date Medicare IM Given:    Medicare IM give by:    Date Additional Medicare IM Given:    Additional Medicare Important Message give by:     If discussed at [place] of Stay Meetings, dates discussed:    Additional Comments:  Elouise Divelbiss A, RN 07/15/2014, 1:19 PM

## 2014-07-15 NOTE — Transfer of Care (Signed)
Immediate Anesthesia Transfer of Care Note  Patient: Kelsey Peterson  Procedure(s) Performed: Procedure(s): COLONOSCOPY (N/A)  Patient Location: PACU  Anesthesia Type:General  Level of Consciousness: awake  Airway & Oxygen Therapy: Patient Spontanous Breathing and Patient connected to nasal cannula oxygen  Post-op Assessment: Report given to RN and Post -op Vital signs reviewed and stable  Post vital signs: Reviewed and stable  Last Vitals:  Filed Vitals:   07/15/14 1708  BP: 154/70  Pulse: 66  Temp: 36.4 C  Resp: 18    Complications: No apparent anesthesia complications

## 2014-07-15 NOTE — Progress Notes (Signed)
Subjective:  Pt had HD yesterday. Tolerated well. Reports shes still having bleeding. Received blood yesterday.  Objective:  Vital signs in last 24 hours:  Temp:  [97.5 F (36.4 C)-98.7 F (37.1 C)] 98 F (36.7 C) (06/26 0902) Pulse Rate:  [70-85] 78 (06/26 0902) Resp:  [15-25] 20 (06/26 0902) BP: (138-186)/(72-108) 150/72 mmHg (06/26 0902) SpO2:  [96 %-100 %] 100 % (06/26 0902) Weight:  [89.8 kg (197 lb 15.6 oz)-91.218 kg (201 lb 1.6 oz)] 91.218 kg (201 lb 1.6 oz) (06/26 2094)  Weight change: -0.012 kg (-0.4 oz) Filed Weights   07/14/14 1145 07/14/14 1546 07/15/14 0607  Weight: 89.8 kg (197 lb 15.6 oz) 90.22 kg (198 lb 14.4 oz) 91.218 kg (201 lb 1.6 oz)    Intake/Output: I/O last 3 completed shifts: In: 493.5 [I.V.:52.5; Blood:341; IV Piggyback:100] Out: 7 [Stool:7]     Physical Exam: General: NAD  HEENT Anicteric, moist mucus membranes  Neck supple  Pulm/lungs CTAB normal effort  CVS/Heart Irregular, no rub  Abdomen:  Soft, Non tender, BS present  Extremities: Trace edema  Neurologic: Awake, alert, follows commands  Skin: No acute rashes  Access: Rt IJ PC       Basic Metabolic Panel:  Recent Labs Lab 07/08/14 1651 07/09/14 0421 07/10/14 0339 07/13/14 2256 07/14/14 0552 07/14/14 1707 07/15/14 0315  NA 138 135 136 142 141 139  --   K 4.4 4.0 3.9 3.8 4.3 2.9* 3.8  CL 97* 98* 95* 103 104 100*  --   CO2 30 28 28 30 26 30   --   GLUCOSE 93 87 84 92 76 66  --   BUN 20 21* 27* 12 12 <5*  --   CREATININE 4.81* 5.38* 6.59* 5.50* 5.64* 2.73*  --   CALCIUM 6.9* 6.4* 6.6* 7.5* 7.5* 7.2*  --   PHOS 2.6  --   --   --   --  2.6  --      CBC:  Recent Labs Lab 07/09/14 0421 07/10/14 0339 07/13/14 2256 07/14/14 0552 07/14/14 1707 07/15/14 0315 07/15/14 1051  WBC 7.4 7.8 10.9 9.2 8.9  --   --   HGB 8.0* 8.4* 8.5* 8.6* 7.3* 8.3* 7.4*  HCT 24.7* 25.4* 26.4* 26.7* 22.9*  --   --   MCV 79.2* 78.8* 80.8 81.7 81.0  --   --   PLT 241 249 299 297 285  --   --        Microbiology: Results for orders placed or performed during the hospital encounter of 07/14/14  MRSA PCR Screening     Status: Abnormal   Collection Time: 07/14/14  6:00 AM  Result Value Ref Range Status   MRSA by PCR POSITIVE (A) NEGATIVE Final    Comment:        The GeneXpert MRSA Assay (FDA approved for NASAL specimens only), is one component of a comprehensive MRSA colonization surveillance program. It is not intended to diagnose MRSA infection nor to guide or monitor treatment for MRSA infections. CRITICAL RESULT CALLED TO, READ BACK BY AND VERIFIED WITH: TAYLOR BALEY ON 07/14/14 AT 0730 BY JEF   C difficile quick scan w PCR reflex ([facility] only)     Status: None   Collection Time: 07/14/14  9:00 AM  Result Value Ref Range Status   C Diff antigen NEGATIVE  Final   C Diff toxin NEGATIVE  Final   C Diff interpretation Negative for C. difficile  Final    Coagulation Studies:  Recent Labs  07/13/14 2256  LABPROT 13.5  INR 1.01    Urinalysis: No results for input(s): COLORURINE, LABSPEC, PHURINE, GLUCOSEU, HGBUR, BILIRUBINUR, KETONESUR, PROTEINUR, UROBILINOGEN, NITRITE, LEUKOCYTESUR in the last 72 hours.  Invalid input(s): APPERANCEUR    Imaging: No results found.   Medications:     . sodium chloride   Intravenous Once  . amiodarone  200 mg Oral BID  . amLODipine  5 mg Oral Daily  . calcium acetate  667 mg Oral TID WC  . Chlorhexidine Gluconate Cloth  6 each Topical Q0600  . cinacalcet  60 mg Oral Q breakfast  . [START ON 07/17/2014] epoetin (EPOGEN/PROCRIT) injection  10,000 Units Intravenous Q T,Th,Sa-HD  . ferrous sulfate  325 mg Oral Daily  . furosemide  80 mg Oral Q M,W,F,Su-1800  . isosorbide mononitrate  30 mg Oral Daily  . metoprolol tartrate  12.5 mg Oral BID  . multivitamin  1 tablet Oral QHS  . mupirocin ointment  1 application Nasal BID  . pantoprazole  40 mg Oral Daily  . sodium chloride  3 mL Intravenous Q12H   sodium chloride,  sodium chloride, acetaminophen, albuterol, heparin, lidocaine (PF), lidocaine-prilocaine, ondansetron **OR** ondansetron (ZOFRAN) IV, pentafluoroprop-tetrafluoroeth, traMADol  Assessment/ Plan:  59 y.o. 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  1.  ESRD on HD TTHS:  - Pt completed HD yesterday, no acute indication for HD today, will plan for HD again on Tuesday.  2  AOCKD/acute gastrointestinal hemorrhage (K92.2):   Received blood transfusion yesterday, hgb 7.4 today, GI and hospitalist following, continue epogen with HD.  3.  SHPTH of renal origin: phos 2.6 at present, continue phoslo and cinacalcet.     LOS: 1 Yaneli Keithley 6/26/201611:17 AM

## 2014-07-15 NOTE — Progress Notes (Signed)
[facility] [place] NAME: Kelsey Peterson    MR#:  [id number]  DATE OF BIRTH:  03-24-1955  SUBJECTIVE:   Had minimal bloody stool last nite. No abd pain REVIEW OF SYSTEMS:   Review of Systems  Constitutional: Negative for fever, chills and weight loss.  HENT: Positive for hearing loss. Negative for ear discharge, ear pain and nosebleeds.   Eyes: Negative for blurred vision, pain and discharge.  Respiratory: Negative for sputum production, shortness of breath, wheezing and stridor.   Cardiovascular: Negative for chest pain, palpitations, orthopnea and PND.  Gastrointestinal: Positive for blood in stool. Negative for nausea, vomiting, abdominal pain and diarrhea.  Genitourinary: Negative for urgency and frequency.  Musculoskeletal: Negative for back pain and joint pain.  Neurological: Negative for sensory change, speech change, focal weakness and weakness.  Psychiatric/Behavioral: Negative for depression. The patient is not nervous/anxious.   All other systems reviewed and are negative.  Tolerating Diet:yes  DRUG ALLERGIES:   Allergies  Allergen Reactions  . Contrast Media [Iodinated Diagnostic Agents]   . Morphine And Related Hives  . Other Rash    Blood Pressure Pill (Pt doesn't remember name)     VITALS:  Blood pressure 141/80, pulse 78, temperature 98.3 F (36.8 C), temperature source Oral, resp. rate 18, height 5\' 2"  (1.575 m), weight 91.218 kg (201 lb 1.6 oz), SpO2 96 %.  PHYSICAL EXAMINATION:   Physical Exam  GENERAL:  59 y.o.-year-old patient lying in the bed with no acute distress.  EYES: Pupils equal, round, reactive to light and accommodation. No scleral icterus. Extraocular muscles intact. Pallor+ HEENT: Head atraumatic, normocephalic. Oropharynx and nasopharynx clear.  NECK:  Supple, no jugular venous distention. No thyroid enlargement, no tenderness.  LUNGS: Normal breath sounds bilaterally, no wheezing,  rales, rhonchi. No use of accessory muscles of respiration.  CARDIOVASCULAR: S1, S2 normal. No murmurs, rubs, or gallops.  ABDOMEN: Soft, nontender, nondistended. Bowel sounds present. No organomegaly or mass.  EXTREMITIES: No cyanosis, clubbing or edema b/l.    NEUROLOGIC: Cranial nerves II through XII are intact. No focal Motor or sensory deficits b/l.   PSYCHIATRIC: The patient is alert and oriented x 3.  SKIN: No obvious rash, lesion, or ulcer.    LABORATORY PANEL:   CBC  Recent Labs Lab 07/14/14 1707  07/15/14 1051  WBC 8.9  --   --   HGB 7.3*  < > 7.4*  HCT 22.9*  --   --   PLT 285  --   --   < > = values in this interval not displayed.  Chemistries   Recent Labs Lab 07/13/14 2256  07/14/14 1707 07/15/14 0315  NA 142  < > 139  --   K 3.8  < > 2.9* 3.8  CL 103  < > 100*  --   CO2 30  < > 30  --   GLUCOSE 92  < > 66  --   BUN 12  < > <5*  --   CREATININE 5.50*  < > 2.73*  --   CALCIUM 7.5*  < > 7.2*  --   AST 38  --   --   --   ALT 33  --   --   --   ALKPHOS 66  --   --   --   BILITOT 0.9  --   --   --   < > = values in this interval not displayed.  1.Lower GI bleed likely from recent polpypectomy on June 20th with 5 polyp removal. Pt resumed back her plavix as per instructions - Hgb 8.5--.8.6-->7.3-->1 unit BT-->8.3 -seen by Dr Tiffany Kocher.conservative mnx  2. End stage renal disease on dialysis - nephrology consult for hemodialysis support. Continue home meds.  3. COPD (chronic obstructive pulmonary disease) - continue home inhalers  4. HTN (hypertension) - continue home medications for this.  5.(coronary artery disease) - chronic and stable at this time, continue home medications, except for her Plavix which we will hold this time due to her bleeding.  6. GERD cont po ppi. No need fo IV ppi. Recent EGD normal  Management plans discussed with the patient, family and they are in agreement.  CODE STATUS: full DVT Prophylaxis: TEDS/SCD  TOTAL TIME  TAKING CARE OF THIS PATIENT: 25 mins >50% time spent on counselling and coordination of care  POSSIBLE D/C IN 2DAYS, DEPENDING ON CLINICAL CONDITION.   Kelsey Peterson M.D on 07/15/2014 at 12:08 PM  Between 7am to 6pm - Pager - [phone]  After 6pm go to [email or web address] - password [email or web address] [facility]  [facility]  Office  [phone]  CC: Primary care physician; No primary care provider on file.

## 2014-07-15 NOTE — Progress Notes (Signed)
Dr Posey Pronto in room with the pt discussing care.  Pt still refuses to take morning meds at this time.  Will offer later when sh gets up the the chair.

## 2014-07-15 NOTE — Progress Notes (Signed)
Pt alert and oriented x4, no complaints of pain or discomfort.  Bed in low position, call bell within reach.  Bed alarms off Assessment done and charted.  Will continue to monitor and do hourly rounding throughout the shift.  Patient refused bed alarm. States the flashing lights will keep her awake because she wants to sleep a little longer and to hold all her meds until later.. I explained the reasoning for the alarm and that with her being so weak she should keep it on but she adamantly refused. Lights turned off per patient request. She states she will not get up without assistance and will call for help if she needs it. Will round hourly on patient.

## 2014-07-15 NOTE — Progress Notes (Signed)
GI Inpatient Follow-up Note  Patient Identification: BRITTANIA SUDBECK is a 59 y.o. female with GI bleed  Subjective: 1030 Ms. Delmont was seen this morning and she reports that she is still having bloody stool.  She reports that she is sleepy and tired.  She denies abdominal pain, nausea or vomiting.  She was transfused yesterday.   1330 - Went to visit Ms.Bethea again.  She had another large bloody stool.  Her Hgb has dropped again to 7.2. She was sitting in her chair playing with her phone during the second visit. Scheduled Inpatient Medications:  . sodium chloride   Intravenous Once  . sodium chloride   Intravenous Once  . amiodarone  200 mg Oral BID  . amLODipine  5 mg Oral Daily  . calcium acetate  667 mg Oral TID WC  . Chlorhexidine Gluconate Cloth  6 each Topical Q0600  . cinacalcet  60 mg Oral Q breakfast  . [START ON 07/17/2014] epoetin (EPOGEN/PROCRIT) injection  10,000 Units Intravenous Q T,Th,Sa-HD  . ferrous sulfate  325 mg Oral Daily  . furosemide  80 mg Oral Q M,W,F,Su-1800  . isosorbide mononitrate  30 mg Oral Daily  . metoprolol tartrate  12.5 mg Oral BID  . multivitamin  1 tablet Oral QHS  . mupirocin ointment  1 application Nasal BID  . pantoprazole  40 mg Oral Daily  . polyethylene glycol-electrolytes  4,000 mL Oral Once  . sodium chloride  3 mL Intravenous Q12H    Continuous Inpatient Infusions:     PRN Inpatient Medications:  sodium chloride, sodium chloride, acetaminophen, albuterol, heparin, lidocaine (PF), lidocaine-prilocaine, ondansetron **OR** ondansetron (ZOFRAN) IV, pentafluoroprop-tetrafluoroeth, traMADol  Review of Systems: Constitutional: Weight is stable.  Eyes: No changes in vision. ENT: No oral lesions, sore throat.  GI: see HPI.  Heme/Lymph: No easy bruising.  CV: No chest pain.  GU: No hematuria.  Integumentary: No rashes.  Neuro: No headaches.  Psych: No depression/anxiety.  Endocrine: No heat/cold intolerance.   Allergic/Immunologic: No urticaria.  Resp: No cough, SOB.  Musculoskeletal: No joint swelling.    Physical Examination: BP 141/80 mmHg  Pulse 78  Temp(Src) 98.3 F (36.8 C) (Oral)  Resp 18  Ht 5\' 2"  (1.575 m)  Wt 91.218 kg (201 lb 1.6 oz)  BMI 36.77 kg/m2  SpO2 96% Gen: NAD, alert and oriented x 4, communication was written with post-it notes to be sure she understood my questions. HEENT: PEERLA, EOMI, Neck: supple, no JVD or thyromegaly Chest: CTA bilaterally, no wheezes, crackles, or other adventitious sounds CV: RRR, no m/g/c/r Abd: soft, NT, ND, +BS in all four quadrants; no HSM, guarding, ridigity, or rebound tenderness Ext: no edema, well perfused with 2+ pulses, Skin: no rash or lesions noted Lymph: no LAD  Data: Lab Results  Component Value Date   WBC 8.9 07/14/2014   HGB 7.4* 07/15/2014   HCT 22.9* 07/14/2014   MCV 81.0 07/14/2014   PLT 285 07/14/2014    Recent Labs Lab 07/14/14 1707 07/15/14 0315 07/15/14 1051  HGB 7.3* 8.3* 7.4*   Lab Results  Component Value Date   NA 139 07/14/2014   K 3.8 07/15/2014   CL 100* 07/14/2014   CO2 30 07/14/2014   BUN <5* 07/14/2014   CREATININE 2.73* 07/14/2014   Lab Results  Component Value Date   ALT 33 07/13/2014   AST 38 07/13/2014   ALKPHOS 66 07/13/2014   BILITOT 0.9 07/13/2014    Recent Labs Lab 07/13/14 2256  INR 1.01  Assessment/Plan: Ms. Mainor is a 59 y.o. female  With GI bleed  Recommendations: Ms. [place] was transfused yesterday.  Her Hgb fell again to to 7.2 and she is scheduled to be transfused once more.  She is scheduled for a colonoscopy with Dr. Vira Agar at [facility] today to locate the source of bleeding.  I wrote out the reasons for [place] doing another colonoscopy as well as the importance of drinking as much of the GoLytely as possible.  She said she understood, agreed to the procedure and signed the consent form. We will continue to follow. Please call with questions or concerns.  Salvadore Farber, PA-C  I personally performed these services.

## 2014-07-15 NOTE — Progress Notes (Signed)
Pt had another episode of large rectal bleed. hgb down to 7.2(8.3) Spoke with Dr Tiffany Kocher. rec golytely 1000cc stat and will do colonoscopy later this afternoon -transfuse 1 unit BT today

## 2014-07-16 ENCOUNTER — Encounter: Payer: Self-pay | Admitting: Gastroenterology

## 2014-07-16 LAB — HEMOGLOBIN
HEMOGLOBIN: 8.6 g/dL — AB (ref 12.0–16.0)
Hemoglobin: 8.7 g/dL — ABNORMAL LOW (ref 12.0–16.0)

## 2014-07-16 MED ORDER — ACETAMINOPHEN 325 MG PO TABS: 650.0000 mg | ORAL_TABLET | Freq: Four times a day (QID) | ORAL | Status: DC | PRN

## 2014-07-16 NOTE — Discharge Instructions (Signed)
Resume your HD as before Do not take plavix for 10 days -renal diet  Resume home health services as before

## 2014-07-16 NOTE — Care Management (Signed)
Patient readmit for rectal bleeding.  She had colonoscopy 6/20 and 5 polyps were removed.  During previous patient qualified for nocturnal 02 and has this through [facility].  Is followed by [facility] nursing.  Had emergent colonoscopy and found that there was bleeding from polypectomy sites.  Notified Dialysis Coordinator notified and home health agency of [place]

## 2014-07-16 NOTE — Progress Notes (Signed)
Per Dr. Posey Pronto, no follow up appointments to schedule - patient to resume regular dialysis schedule. No new prescriptions.   No needs from Care Management. Patient refused to work on Regulatory affairs officer with chaplin.   Toniann Ket

## 2014-07-16 NOTE — Progress Notes (Signed)
A&O. Up with minimal assist. Colonoscopy done on day shift. No further bleeding. VSS. No complaints. Refused bed alarm. Educated patient on reasoning for alarm. SLept well through the night. On 2L O2, she wears this only at night.

## 2014-07-16 NOTE — Consult Note (Signed)
GI Inpatient Follow-up Note  Patient Identification: Kelsey Peterson is a 59 y.o. female with GI Bleed  Subjective: Kelsey Peterson reports that she is feeling much better today.  Denies abdominal pain, nausea, vomiting or any further bleeding.  She reports that she is hungry and was looking forward to meals.     Scheduled Inpatient Medications:  . sodium chloride   Intravenous Once  . amiodarone  200 mg Oral BID  . amLODipine  5 mg Oral Daily  . calcium acetate  667 mg Oral TID WC  . Chlorhexidine Gluconate Cloth  6 each Topical Q0600  . cinacalcet  60 mg Oral Q breakfast  . [START ON 07/17/2014] epoetin (EPOGEN/PROCRIT) injection  10,000 Units Intravenous Q T,Th,Sa-HD  . ferrous sulfate  325 mg Oral Daily  . furosemide  80 mg Oral Q M,W,F,Su-1800  . isosorbide mononitrate  30 mg Oral Daily  . metoprolol tartrate  12.5 mg Oral BID  . multivitamin  1 tablet Oral QHS  . mupirocin ointment  1 application Nasal BID  . pantoprazole  40 mg Oral Daily  . sodium chloride  3 mL Intravenous Q12H    Continuous Inpatient Infusions:     PRN Inpatient Medications:  sodium chloride, sodium chloride, acetaminophen, albuterol, lidocaine (PF), lidocaine-prilocaine, ondansetron **OR** ondansetron (ZOFRAN) IV, pentafluoroprop-tetrafluoroeth, traMADol  Review of Systems: Constitutional: Weight is stable.  Eyes: No changes in vision. ENT: No oral lesions, sore throat.  GI: see HPI.  Heme/Lymph: No easy bruising.  CV: No chest pain.  GU: No hematuria.  Integumentary: No rashes.  Neuro: No headaches.  Psych: No depression/anxiety.  Endocrine: No heat/cold intolerance.  Allergic/Immunologic: No urticaria.  Resp: No cough, SOB.  Musculoskeletal: No joint swelling.    Physical Examination: BP 133/76 mmHg  Pulse 66  Temp(Src) 97.8 F (36.6 C) (Oral)  Resp 24  Ht 5\' 2"  (1.575 m)  Wt 93.548 kg (206 lb 3.8 oz)  BMI 37.71 kg/m2  SpO2 95% Gen: NAD, alert and oriented x 4, I communicated with  the patient via handwritten notes HEENT: PEERLA, EOMI, Neck: supple, no JVD or thyromegaly Chest: CTA bilaterally, no wheezes, crackles, or other adventitious sounds CV: RRR, no m/g/c/r Abd: soft, NT, ND, +BS in all four quadrants; no HSM, guarding, ridigity, or rebound tenderness Ext: no edema, well perfused with 2+ pulses, Skin: no rash or lesions noted Lymph: no LAD  Data: Lab Results  Component Value Date   WBC 8.9 07/14/2014   HGB 8.6* 07/16/2014   HCT 22.9* 07/14/2014   MCV 81.0 07/14/2014   PLT 285 07/14/2014    Recent Labs Lab 07/15/14 1657 07/16/14 0055 07/16/14 0859  HGB 9.3* 8.7* 8.6*   Lab Results  Component Value Date   NA 139 07/14/2014   K 3.8 07/15/2014   CL 100* 07/14/2014   CO2 30 07/14/2014   BUN <5* 07/14/2014   CREATININE 2.73* 07/14/2014   Lab Results  Component Value Date   ALT 33 07/13/2014   AST 38 07/13/2014   ALKPHOS 66 07/13/2014   BILITOT 0.9 07/13/2014    Recent Labs Lab 07/13/14 2256  INR 1.01   Colonoscopy Procedure:  Dr. Gaylyn Cheers  07/15/2014 5:58 pm 4 sites were seen that had fresh clots adherent to ulcerated post polypectomy sites. None were actively bleeding but due to the need for dialysis and low dose heparin wih his i went ahead and placed 3 clips on each of 3 sites in the ascending colon and 4 clips on  the cecal site with good results and no active bleeding anywhere. Two miniscule polyps were seen but not removed, probably 84mm each. Findings: Multiple small-mouthed diverticula were found in the sigmoid colon. Internal hemorrhoids were found during endoscopy. The hemorrhoids were small and Grade I (internal hemorrhoids that do not prolapse). - Diverticulosis in the sigmoid colon. - Internal hemorrhoids. - No specimens collected. Impression: - The findings and recommendations were discussed with the patient. Start clear liquid diet and advance to routine diet tomorrow.  Assessment/Plan: Kelsey Peterson is a 60  y.o. female with GI bleed   Recommendations: Several clips (13 in all) were applied to the sites were previous polyps had been removed and appeared to be done with good success.  Hgb is stable, no further bleeding at this time. We believe patient is fine for discharge.  Patient reports that nephrology wants her to stay for her dialysis tomorrow.  We will continue to follow with you. Please call with questions or concerns.  Salvadore Farber, PA-C  I personally performed these services.

## 2014-07-16 NOTE — Clinical Social Work Note (Signed)
CSW notified RN for chaplin consult for Advance Directives.  CSW signing off

## 2014-07-16 NOTE — Progress Notes (Signed)
Subjective:  Re-admitted for continued GIO bleed after colonoscopy Repeat evaluation showed no active bleed. Severeal clips were applied to sites with good results Patient denies any acute c./o at present No further bleed   Objective:  Vital signs in last 24 hours:  Temp:  [97.2 F (36.2 C)-98.3 F (36.8 C)] 97.6 F (36.4 C) (06/27 0529) Pulse Rate:  [60-78] 63 (06/27 0529) Resp:  [14-20] 19 (06/27 0529) BP: (135-190)/(65-94) 135/77 mmHg (06/27 0529) SpO2:  [96 %-100 %] 100 % (06/27 0529) FiO2 (%):  [100 %] 100 % (06/26 1800) Weight:  [93.548 kg (206 lb 3.8 oz)] 93.548 kg (206 lb 3.8 oz) (06/27 0547)  Weight change: 3.748 kg (8 lb 4.2 oz) Filed Weights   07/14/14 1546 07/15/14 0607 07/16/14 0547  Weight: 90.22 kg (198 lb 14.4 oz) 91.218 kg (201 lb 1.6 oz) 93.548 kg (206 lb 3.8 oz)    Intake/Output: I/O last 3 completed shifts: In: 1161 [I.V.:500; Blood:661] Out: 3 [Stool:3]     Physical Exam: General: NAD  HEENT Anicteric, moist mucus membranes  Neck supple  Pulm/lungs CTAB normal effort  CVS/Heart Irregular, no rub  Abdomen:  Soft, Non tender, BS present  Extremities: Trace edema  Neurologic: Awake, alert, follows commands  Skin: No acute rashes  Access: Rt IJ PC       Basic Metabolic Panel:  Recent Labs Lab 07/10/14 0339 07/13/14 2256 07/14/14 0552 07/14/14 1707 07/15/14 0315  NA 136 142 141 139  --   K 3.9 3.8 4.3 2.9* 3.8  CL 95* 103 104 100*  --   CO2 28 30 26 30   --   GLUCOSE 84 92 76 66  --   BUN 27* 12 12 <5*  --   CREATININE 6.59* 5.50* 5.64* 2.73*  --   CALCIUM 6.6* 7.5* 7.5* 7.2*  --   PHOS  --   --   --  2.6  --      CBC:  Recent Labs Lab 07/10/14 0339 07/13/14 2256 07/14/14 0552 07/14/14 1707 07/15/14 0315 07/15/14 1051 07/15/14 1657 07/16/14 0055 07/16/14 0859  WBC 7.8 10.9 9.2 8.9  --   --   --   --   --   HGB 8.4* 8.5* 8.6* 7.3* 8.3* 7.4* 9.3* 8.7* 8.6*  HCT 25.4* 26.4* 26.7* 22.9*  --   --   --   --   --   MCV  78.8* 80.8 81.7 81.0  --   --   --   --   --   PLT 249 299 297 285  --   --   --   --   --       Microbiology: Results for orders placed or performed during the hospital encounter of 07/14/14  MRSA PCR Screening     Status: Abnormal   Collection Time: 07/14/14  6:00 AM  Result Value Ref Range Status   MRSA by PCR POSITIVE (A) NEGATIVE Final    Comment:        The GeneXpert MRSA Assay (FDA approved for NASAL specimens only), is one component of a comprehensive MRSA colonization surveillance program. It is not intended to diagnose MRSA infection nor to guide or monitor treatment for MRSA infections. CRITICAL RESULT CALLED TO, READ BACK BY AND VERIFIED WITH: TAYLOR BALEY ON 07/14/14 AT 0730 BY JEF   C difficile quick scan w PCR reflex ([facility] only)     Status: None   Collection Time: 07/14/14  9:00 AM  Result Value Ref  Range Status   C Diff antigen NEGATIVE  Final   C Diff toxin NEGATIVE  Final   C Diff interpretation Negative for C. difficile  Final    Coagulation Studies:  Recent Labs  07/13/14 2256  LABPROT 13.5  INR 1.01    Urinalysis: No results for input(s): COLORURINE, LABSPEC, PHURINE, GLUCOSEU, HGBUR, BILIRUBINUR, KETONESUR, PROTEINUR, UROBILINOGEN, NITRITE, LEUKOCYTESUR in the last 72 hours.  Invalid input(s): APPERANCEUR    Imaging: No results found.   Medications:     . sodium chloride   Intravenous Once  . amiodarone  200 mg Oral BID  . amLODipine  5 mg Oral Daily  . calcium acetate  667 mg Oral TID WC  . Chlorhexidine Gluconate Cloth  6 each Topical Q0600  . cinacalcet  60 mg Oral Q breakfast  . [START ON 07/17/2014] epoetin (EPOGEN/PROCRIT) injection  10,000 Units Intravenous Q T,Th,Sa-HD  . ferrous sulfate  325 mg Oral Daily  . furosemide  80 mg Oral Q M,W,F,Su-1800  . isosorbide mononitrate  30 mg Oral Daily  . metoprolol tartrate  12.5 mg Oral BID  . multivitamin  1 tablet Oral QHS  . mupirocin ointment  1 application Nasal BID  .  pantoprazole  40 mg Oral Daily  . 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  ESRD on HD TTHS:  - HD outpatient tomorrow - Electrolytes and Volume status are acceptable No acute indication for Dialysis at present   2.  AOCKD:   - pt to continue epogen as outpt.   3. GI bleed  - repeat colonoscopy done - stable now  4. SHPTH - new presciption for PhosLo called   LOS: 2 Cathern Tahir 6/27/201611:12 AM

## 2014-07-16 NOTE — Progress Notes (Signed)
IM given 07/16/2014 Joni Reining RN

## 2014-07-16 NOTE — Discharge Summary (Signed)
[place] [facility] [place] NAME: Kelsey Peterson    MR#:  [id number]  DATE OF BIRTH:  04-09-1955  DATE OF ADMISSION:  07/14/2014 ADMITTING PHYSICIAN: Lance Coon, MD  DATE OF DISCHARGE: 07/16/2014  PRIMARY CARE PHYSICIAN: No primary care provider on file.    ADMISSION DIAGNOSIS:  Rectal bleeding [K62.5] Postoperative hemorrhage involving digestive system following digestive system procedure [K91.840] Anemia due to other cause [D64.89]  DISCHARGE DIAGNOSIS:  Lower GI bleed likley delayed from polypectomy site now s/p clipping via colonoscopy -Acute post hemoarrhagic anemia s/p 2 unit BT ESRD on HD  SECONDARY DIAGNOSIS:   Past Medical History  Diagnosis Date  . COPD (chronic obstructive pulmonary disease)   . ESRD on hemodialysis   . DM2 (diabetes mellitus, type 2)   . Hepatitis C   . Migraine with aura   . HOH (hard of hearing)   . Mitral regurgitation     a. echo 06/2014: EF 60%, no WMA, GR1DD, mild AI, calcified mitral annulus, mod MR, no atrial septal defect or patent foramen ovale   . Cognitive impairment   . CAD (coronary artery disease)   . HTN (hypertension)     HOSPITAL COURSE:   1.Lower GI bleed likely from recent polpypectomy on June 20th with 5 polyp removal. Pt resumed back her plavix as per instructions - Hgb 8.5--.8.6-->7.3-->1 unit BT-->8.3-->7.2--->1 unit---.8.7 -seen by Dr Tiffany Kocher.and underwent colonoscopy with clipping at polypectomy sites due to fresh adherent clots. No more bleeding since -tolerating renal reg diet  2. End stage renal disease on dialysis - nephrology consult for hemodialysis support. Continue home meds.  3. COPD (chronic obstructive pulmonary disease) - continue home inhalers  4. HTN (hypertension) - continue home medications for this.  5.(coronary artery disease) - chronic and stable at this time, continue home medications, except for her Plavix which we will hold this time due to  her bleeding.and have asked her to resume after 10 days  6. GERD cont po ppi.  Recent EGD normal  Management plans discussed with the patient, family (son Kelsey Peterson) and they are in agreement.  D/c home today with resumption of [place] services as before  CODE STATUS: full DVT Prophylaxis: TEDS/SCD  TOTAL TIME TAKING CARE OF THIS PATIENT: 40 mins DISCHARGE CONDITIONS:   fair  CONSULTS OBTAINED:  Treatment Team:  Manya Silvas, MD Anthonette Legato, MD  DRUG ALLERGIES:   Allergies  Allergen Reactions  . Contrast Media [Iodinated Diagnostic Agents]   . Morphine And Related Hives  . Other Rash    Blood Pressure Pill (Pt doesn't remember name)     DISCHARGE MEDICATIONS:   Current Discharge Medication List    CONTINUE these medications which have NOT CHANGED   Details  acetaminophen (TYLENOL) 325 MG tablet Take 650 mg by mouth every 6 (six) hours as needed for moderate pain or fever.    albuterol (PROVENTIL HFA;VENTOLIN HFA) 108 (90 BASE) MCG/ACT inhaler Inhale 2 puffs into the lungs every 4 (four) hours as needed for wheezing or shortness of breath.     amiodarone (PACERONE) 200 MG tablet Take 1 tablet (200 mg total) by mouth 2 (two) times daily. Qty: 60 tablet, Refills: 1    amLODipine (NORVASC) 5 MG tablet Take 5 mg by mouth daily.    calcium carbonate (TUMS - DOSED IN MG ELEMENTAL CALCIUM) 500 MG chewable tablet Chew 1 tablet by mouth daily.    clopidogrel (PLAVIX) 75 MG tablet Take 75 mg  by mouth daily.    esomeprazole (NEXIUM) 40 MG capsule Take 40 mg by mouth daily.     ferrous sulfate 325 (65 FE) MG tablet Take 325 mg by mouth daily.    folic acid-vitamin b complex-vitamin c-selenium-zinc (DIALYVITE) 3 MG TABS tablet Take 1 tablet by mouth daily.    furosemide (LASIX) 80 MG tablet Take 80 mg by mouth daily. Patient takes on non dialysis days.    isosorbide mononitrate (IMDUR) 30 MG 24 hr tablet Take 30 mg by mouth daily.    Lidocaine-Prilocaine, Bulk,  6.3-1.4 % CREA 1 application by Other route as needed (for tx). Apply to site 20-45 minutes before tx.    metoprolol tartrate (LOPRESSOR) 25 MG tablet Take 0.5 tablets (12.5 mg total) by mouth 2 (two) times daily. Qty: 30 tablet, Refills: 1    traMADol (ULTRAM) 50 MG tablet Take 50 mg by mouth 2 (two) times daily as needed for moderate pain.    vancomycin 1,000 mg in sodium chloride 0.9 % 250 mL Inject 1,000 mg into the vein Every Tuesday,Thursday,and Saturday with dialysis. Qty: 5000 mg, Refills: 0    calcium acetate (PHOSLO) 667 MG capsule Take 1 capsule (667 mg total) by mouth 3 (three) times daily with meals. Qty: 90 capsule, Refills: 1    cinacalcet (SENSIPAR) 60 MG tablet Take 60 mg by mouth daily. With largest meal         DISCHARGE INSTRUCTIONS:   Resume HH services -resume your HD as before  If you experience worsening of your admission symptoms, develop shortness of breath, life threatening emergency, suicidal or homicidal thoughts you must seek medical attention immediately by calling 911 or calling your MD immediately  if symptoms less severe.  You Must read complete instructions/literature along with all the possible adverse reactions/side effects for all the Medicines you take and that have been prescribed to you. Take any new Medicines after you have completely understood and accept all the possible adverse reactions/side effects.   Please note  You were cared for by a hospitalist during your hospital stay. If you have any questions about your discharge medications or the care you received while you were in the hospital after you are discharged, you can call the unit and asked to speak with the hospitalist on call if the hospitalist that took care of you is not available. Once you are discharged, your primary care physician will handle any further medical issues. Please note that NO REFILLS for any discharge medications will be authorized once you are discharged, as it is  imperative that you return to your primary care physician (or establish a relationship with a primary care physician if you do not have one) for your aftercare needs so that they can reassess your need for medications and monitor your lab values. Today   SUBJECTIVE   Doing well. No more rectal bleeding  VITAL SIGNS:  Blood pressure 135/77, pulse 63, temperature 97.6 F (36.4 C), temperature source Oral, resp. rate 19, height 5\' 2"  (1.575 m), weight 93.548 kg (206 lb 3.8 oz), SpO2 100 %.  I/O:   Intake/Output Summary (Last 24 hours) at 07/16/14 1057 Last data filed at 07/16/14 1015  Gross per 24 hour  Intake    820 ml  Output      0 ml  Net    820 ml    PHYSICAL EXAMINATION:  GENERAL:  59 y.o.-year-old patient lying in the bed with no acute distress.  EYES: Pupils equal, round, reactive to  light and accommodation. No scleral icterus. Extraocular muscles intact.  HEENT: Head atraumatic, normocephalic. Oropharynx and nasopharynx clear.  NECK:  Supple, no jugular venous distention. No thyroid enlargement, no tenderness.  LUNGS: Normal breath sounds bilaterally, no wheezing, rales,rhonchi or crepitation. No use of accessory muscles of respiration.  CARDIOVASCULAR: S1, S2 normal. No murmurs, rubs, or gallops.  ABDOMEN: Soft, non-tender, non-distended. Bowel sounds present. No organomegaly or mass.  EXTREMITIES: No pedal edema, cyanosis, or clubbing.  NEUROLOGIC: Cranial nerves II through XII are intact. Muscle strength 5/5 in all extremities. Sensation intact. Gait not checked.  PSYCHIATRIC: The patient is alert and oriented x 3.  SKIN: No obvious rash, lesion, or ulcer.   DATA REVIEW:   CBC   Recent Labs Lab 07/14/14 1707  07/16/14 0859  WBC 8.9  --   --   HGB 7.3*  < > 8.6*  HCT 22.9*  --   --   PLT 285  --   --   < > = values in this interval not displayed.  Chemistries   Recent Labs Lab 07/13/14 2256  07/14/14 1707 07/15/14 0315  NA 142  < > 139  --   K 3.8  < >  2.9* 3.8  CL 103  < > 100*  --   CO2 30  < > 30  --   GLUCOSE 92  < > 66  --   BUN 12  < > <5*  --   CREATININE 5.50*  < > 2.73*  --   CALCIUM 7.5*  < > 7.2*  --   AST 38  --   --   --   ALT 33  --   --   --   ALKPHOS 66  --   --   --   BILITOT 0.9  --   --   --   < > = values in this interval not displayed.  Microbiology Results   Recent Results (from the past 240 hour(s))  Culture, blood (routine x 2)     Status: None   Collection Time: 07/06/14  3:38 PM  Result Value Ref Range Status   Specimen Description BLOOD  Final   Special Requests Normal  Final   Culture NO GROWTH 5 DAYS  Final   Report Status 07/11/2014 FINAL  Final  Culture, blood (routine x 2)     Status: None   Collection Time: 07/06/14  4:29 PM  Result Value Ref Range Status   Specimen Description BLOOD  Final   Special Requests Normal  Final   Culture NO GROWTH 5 DAYS  Final   Report Status 07/11/2014 FINAL  Final  MRSA PCR Screening     Status: Abnormal   Collection Time: 07/14/14  6:00 AM  Result Value Ref Range Status   MRSA by PCR POSITIVE (A) NEGATIVE Final    Comment:        The GeneXpert MRSA Assay (FDA approved for NASAL specimens only), is one component of a comprehensive MRSA colonization surveillance program. It is not intended to diagnose MRSA infection nor to guide or monitor treatment for MRSA infections. CRITICAL RESULT CALLED TO, READ BACK BY AND VERIFIED WITH: TAYLOR BALEY ON 07/14/14 AT 0730 BY JEF   C difficile quick scan w PCR reflex ([facility] only)     Status: None   Collection Time: 07/14/14  9:00 AM  Result Value Ref Range Status   C Diff antigen NEGATIVE  Final   C Diff toxin NEGATIVE  Final   C Diff interpretation Negative for C. difficile  Final    RADIOLOGY:  No results found.   Management plans discussed with the patient, family and they are in agreement.  CODE STATUS:     Code Status Orders        Start     Ordered   07/14/14 0518  Full code   Continuous      07/14/14 0517    TOTAL TIME TAKING CARE OF THIS PATIENT: 40 minutes.    Hildy Nicholl M.D on 07/16/2014 at 10:57 AM  Between 7am to 6pm - Pager - [phone] After 6pm go to [email or web address] - password [email or web address] [facility]  [facility]  Office  [phone]  CC: Primary care physician; No primary care provider on file.

## 2014-07-16 NOTE — Care Management Note (Signed)
Patient is active at [facility] [place].  TTS schedule. Iran Sizer  [phone]

## 2014-07-16 NOTE — Progress Notes (Signed)
Patient given discharge teaching and paperwork regarding medications, diet, follow-up appointments and activity. Patient understanding verbalized. No complaints at this time. IV and telemetry discontinued. Skin assessment as previously charted and vitals are stable. Patient being discharged to home. Son present during discharge teaching.   Kelsey Peterson

## 2014-07-16 NOTE — Progress Notes (Signed)
Patient is refusing bed alarm. Educated on safety.  Toniann Ket

## 2014-09-01 ENCOUNTER — Inpatient Hospital Stay
Admission: EM | Admit: 2014-09-01 | Discharge: 2014-09-06 | DRG: 640 | Disposition: A | Discharge status: Home / Self Care | Payer: Medicare Other | Attending: Internal Medicine | Admitting: Internal Medicine

## 2014-09-01 ENCOUNTER — Emergency Department: Payer: Medicare Other

## 2014-09-01 ENCOUNTER — Encounter: Payer: Self-pay | Admitting: *Deleted

## 2014-09-01 DIAGNOSIS — Z9851 Tubal ligation status: Secondary | ICD-10-CM | POA: Diagnosis not present

## 2014-09-01 DIAGNOSIS — Z992 Dependence on renal dialysis: Secondary | ICD-10-CM

## 2014-09-01 DIAGNOSIS — Z9981 Dependence on supplemental oxygen: Secondary | ICD-10-CM

## 2014-09-01 DIAGNOSIS — Z9889 Other specified postprocedural states: Secondary | ICD-10-CM | POA: Diagnosis not present

## 2014-09-01 DIAGNOSIS — Z885 Allergy status to narcotic agent status: Secondary | ICD-10-CM | POA: Diagnosis not present

## 2014-09-01 DIAGNOSIS — Z91041 Radiographic dye allergy status: Secondary | ICD-10-CM | POA: Diagnosis not present

## 2014-09-01 DIAGNOSIS — N186 End stage renal disease: Secondary | ICD-10-CM | POA: Diagnosis present

## 2014-09-01 DIAGNOSIS — E8770 Fluid overload, unspecified: Secondary | ICD-10-CM | POA: Diagnosis present

## 2014-09-01 DIAGNOSIS — J449 Chronic obstructive pulmonary disease, unspecified: Secondary | ICD-10-CM | POA: Diagnosis present

## 2014-09-01 DIAGNOSIS — H9190 Unspecified hearing loss, unspecified ear: Secondary | ICD-10-CM | POA: Diagnosis present

## 2014-09-01 DIAGNOSIS — D631 Anemia in chronic kidney disease: Secondary | ICD-10-CM | POA: Diagnosis present

## 2014-09-01 DIAGNOSIS — N2581 Secondary hyperparathyroidism of renal origin: Secondary | ICD-10-CM | POA: Diagnosis present

## 2014-09-01 DIAGNOSIS — J9620 Acute and chronic respiratory failure, unspecified whether with hypoxia or hypercapnia: Secondary | ICD-10-CM | POA: Diagnosis present

## 2014-09-01 DIAGNOSIS — I120 Hypertensive chronic kidney disease with stage 5 chronic kidney disease or end stage renal disease: Secondary | ICD-10-CM | POA: Diagnosis present

## 2014-09-01 DIAGNOSIS — Z888 Allergy status to other drugs, medicaments and biological substances status: Secondary | ICD-10-CM | POA: Diagnosis not present

## 2014-09-01 DIAGNOSIS — Z79899 Other long term (current) drug therapy: Secondary | ICD-10-CM | POA: Diagnosis not present

## 2014-09-01 DIAGNOSIS — I340 Nonrheumatic mitral (valve) insufficiency: Secondary | ICD-10-CM | POA: Diagnosis present

## 2014-09-01 DIAGNOSIS — G43109 Migraine with aura, not intractable, without status migrainosus: Secondary | ICD-10-CM | POA: Diagnosis present

## 2014-09-01 DIAGNOSIS — J189 Pneumonia, unspecified organism: Secondary | ICD-10-CM | POA: Diagnosis present

## 2014-09-01 DIAGNOSIS — Z8249 Family history of ischemic heart disease and other diseases of the circulatory system: Secondary | ICD-10-CM | POA: Diagnosis not present

## 2014-09-01 DIAGNOSIS — Z7951 Long term (current) use of inhaled steroids: Secondary | ICD-10-CM | POA: Diagnosis not present

## 2014-09-01 DIAGNOSIS — Z8661 Personal history of infections of the central nervous system: Secondary | ICD-10-CM | POA: Diagnosis not present

## 2014-09-01 DIAGNOSIS — N63 Unspecified lump in unspecified breast: Secondary | ICD-10-CM

## 2014-09-01 DIAGNOSIS — I2510 Atherosclerotic heart disease of native coronary artery without angina pectoris: Secondary | ICD-10-CM | POA: Diagnosis present

## 2014-09-01 DIAGNOSIS — E875 Hyperkalemia: Secondary | ICD-10-CM | POA: Diagnosis not present

## 2014-09-01 DIAGNOSIS — I10 Essential (primary) hypertension: Secondary | ICD-10-CM | POA: Diagnosis present

## 2014-09-01 DIAGNOSIS — I509 Heart failure, unspecified: Secondary | ICD-10-CM | POA: Diagnosis present

## 2014-09-01 DIAGNOSIS — J90 Pleural effusion, not elsewhere classified: Secondary | ICD-10-CM

## 2014-09-01 DIAGNOSIS — Z8619 Personal history of other infectious and parasitic diseases: Secondary | ICD-10-CM | POA: Diagnosis not present

## 2014-09-01 LAB — BASIC METABOLIC PANEL
ANION GAP: 11 (ref 5–15)
BUN: 22 mg/dL — AB (ref 6–20)
CALCIUM: 8.2 mg/dL — AB (ref 8.9–10.3)
CO2: 31 mmol/L (ref 22–32)
Chloride: 96 mmol/L — ABNORMAL LOW (ref 101–111)
Creatinine, Ser: 6.36 mg/dL — ABNORMAL HIGH (ref 0.44–1.00)
GFR calc non Af Amer: 6 mL/min — ABNORMAL LOW (ref >60)
GFR, EST AFRICAN AMERICAN: 7 mL/min — AB (ref >60)
GLUCOSE: 99 mg/dL (ref 65–99)
POTASSIUM: 6 mmol/L — AB (ref 3.5–5.1)
SODIUM: 138 mmol/L (ref 135–145)

## 2014-09-01 LAB — CBC
HCT: 27.5 % — ABNORMAL LOW (ref 35.0–47.0)
Hemoglobin: 9.1 g/dL — ABNORMAL LOW (ref 12.0–16.0)
MCH: 27 pg (ref 26.0–34.0)
MCHC: 32.9 g/dL (ref 32.0–36.0)
MCV: 82.1 fL (ref 80.0–100.0)
Platelets: 313 10*3/uL (ref 150–440)
RBC: 3.35 MIL/uL — AB (ref 3.80–5.20)
RDW: 21.1 % — ABNORMAL HIGH (ref 11.5–14.5)
WBC: 9.8 10*3/uL (ref 3.6–11.0)

## 2014-09-01 LAB — BRAIN NATRIURETIC PEPTIDE

## 2014-09-01 MED ORDER — SODIUM CHLORIDE 0.9 % IV SOLN: 250.0000 mL | INTRAVENOUS | Status: DC | PRN

## 2014-09-01 MED ORDER — ALBUTEROL SULFATE (2.5 MG/3ML) 0.083% IN NEBU: 2.5000 mg | INHALATION_SOLUTION | RESPIRATORY_TRACT | Status: DC | PRN

## 2014-09-01 MED ORDER — CALCIUM ACETATE (PHOS BINDER) 667 MG PO CAPS
667.0000 mg | ORAL_CAPSULE | Freq: Three times a day (TID) | ORAL | Status: DC
  Administered 2014-09-02 – 2014-09-06 (×10): 667 mg via ORAL
  Filled 2014-09-01 (×10): qty 1

## 2014-09-01 MED ORDER — RENA-VITE PO TABS
1.0000 | ORAL_TABLET | Freq: Every day | ORAL | Status: DC
Start: 2014-09-01 — End: 2014-09-06
  Administered 2014-09-01 – 2014-09-05 (×5): 1 via ORAL
  Filled 2014-09-01 (×5): qty 1

## 2014-09-01 MED ORDER — INSULIN ASPART 100 UNIT/ML ~~LOC~~ SOLN: 0.0000 [IU] | Freq: Every day | SUBCUTANEOUS | Status: DC

## 2014-09-01 MED ORDER — ACETAMINOPHEN 325 MG PO TABS
650.0000 mg | ORAL_TABLET | Freq: Four times a day (QID) | ORAL | Status: DC | PRN
Start: 2014-09-01 — End: 2014-09-06

## 2014-09-01 MED ORDER — INSULIN ASPART 100 UNIT/ML ~~LOC~~ SOLN: 0.0000 [IU] | Freq: Three times a day (TID) | SUBCUTANEOUS | Status: DC

## 2014-09-01 MED ORDER — ONDANSETRON HCL 4 MG/2ML IJ SOLN: 4.0000 mg | Freq: Four times a day (QID) | INTRAMUSCULAR | Status: DC | PRN

## 2014-09-01 MED ORDER — TRAMADOL HCL 50 MG PO TABS: 50.0000 mg | ORAL_TABLET | Freq: Two times a day (BID) | ORAL | Status: DC | PRN

## 2014-09-01 MED ORDER — PANTOPRAZOLE SODIUM 40 MG PO TBEC
40.0000 mg | DELAYED_RELEASE_TABLET | Freq: Every day | ORAL | Status: DC
  Administered 2014-09-02 – 2014-09-06 (×5): 40 mg via ORAL
  Filled 2014-09-01 (×5): qty 1

## 2014-09-01 MED ORDER — CALCIUM CARBONATE ANTACID 500 MG PO CHEW
1.0000 | CHEWABLE_TABLET | Freq: Every day | ORAL | Status: DC
  Administered 2014-09-02 – 2014-09-03 (×2): 200 mg via ORAL
  Filled 2014-09-01 (×4): qty 1

## 2014-09-01 MED ORDER — LIDOCAINE-PRILOCAINE 2.5-2.5 % EX CREA
TOPICAL_CREAM | CUTANEOUS | Status: DC | PRN
  Filled 2014-09-01: qty 5

## 2014-09-01 MED ORDER — SODIUM CHLORIDE 0.9 % IJ SOLN
3.0000 mL | INTRAMUSCULAR | Status: DC | PRN
  Administered 2014-09-03: 3 mL via INTRAVENOUS
  Filled 2014-09-01: qty 10

## 2014-09-01 MED ORDER — FERROUS SULFATE 325 (65 FE) MG PO TABS
325.0000 mg | ORAL_TABLET | Freq: Every day | ORAL | Status: DC
  Administered 2014-09-02 – 2014-09-06 (×5): 325 mg via ORAL
  Filled 2014-09-01 (×5): qty 1

## 2014-09-01 MED ORDER — HEPARIN SODIUM (PORCINE) 5000 UNIT/ML IJ SOLN
5000.0000 [IU] | Freq: Three times a day (TID) | INTRAMUSCULAR | Status: DC
  Administered 2014-09-02 – 2014-09-06 (×11): 5000 [IU] via SUBCUTANEOUS
  Filled 2014-09-01 (×11): qty 1

## 2014-09-01 MED ORDER — AMLODIPINE BESYLATE 5 MG PO TABS
5.0000 mg | ORAL_TABLET | Freq: Every day | ORAL | Status: DC
  Administered 2014-09-02 – 2014-09-06 (×5): 5 mg via ORAL
  Filled 2014-09-01 (×5): qty 1

## 2014-09-01 MED ORDER — AMIODARONE HCL 200 MG PO TABS
200.0000 mg | ORAL_TABLET | Freq: Two times a day (BID) | ORAL | Status: DC
  Administered 2014-09-01 – 2014-09-06 (×9): 200 mg via ORAL
  Filled 2014-09-01 (×9): qty 1

## 2014-09-01 MED ORDER — ONDANSETRON HCL 4 MG PO TABS: 4.0000 mg | ORAL_TABLET | Freq: Four times a day (QID) | ORAL | Status: DC | PRN

## 2014-09-01 MED ORDER — HYDRALAZINE HCL 20 MG/ML IJ SOLN: 10.0000 mg | Freq: Four times a day (QID) | INTRAMUSCULAR | Status: DC | PRN

## 2014-09-01 MED ORDER — METOPROLOL TARTRATE 25 MG PO TABS
12.5000 mg | ORAL_TABLET | Freq: Two times a day (BID) | ORAL | Status: DC
  Administered 2014-09-01 – 2014-09-06 (×9): 12.5 mg via ORAL
  Filled 2014-09-01 (×9): qty 1

## 2014-09-01 MED ORDER — LIDOCAINE-PRILOCAINE (BULK) 2.5-2.5 % CREA
1.0000 | TOPICAL_CREAM | Status: DC | PRN
Start: 2014-09-01 — End: 2014-09-01

## 2014-09-01 MED ORDER — CINACALCET HCL 30 MG PO TABS: 60.0000 mg | ORAL_TABLET | Freq: Every day | ORAL | Status: DC

## 2014-09-01 MED ORDER — ALBUTEROL SULFATE HFA 108 (90 BASE) MCG/ACT IN AERS: 2.0000 | INHALATION_SPRAY | RESPIRATORY_TRACT | Status: DC | PRN

## 2014-09-01 MED ORDER — ISOSORBIDE MONONITRATE ER 30 MG PO TB24
30.0000 mg | ORAL_TABLET | Freq: Every day | ORAL | Status: DC
  Administered 2014-09-02 – 2014-09-06 (×5): 30 mg via ORAL
  Filled 2014-09-01 (×5): qty 1

## 2014-09-01 MED ORDER — CETYLPYRIDINIUM CHLORIDE 0.05 % MT LIQD
7.0000 mL | Freq: Two times a day (BID) | OROMUCOSAL | Status: DC
  Administered 2014-09-03: 7 mL via OROMUCOSAL

## 2014-09-01 MED ORDER — SODIUM CHLORIDE 0.9 % IJ SOLN
3.0000 mL | Freq: Two times a day (BID) | INTRAMUSCULAR | Status: DC
  Administered 2014-09-02 – 2014-09-05 (×7): 3 mL via INTRAVENOUS

## 2014-09-01 MED ORDER — DIALYVITE 3000 3 MG PO TABS: 1.0000 | ORAL_TABLET | Freq: Every day | ORAL | Status: DC

## 2014-09-01 MED ORDER — ACETAMINOPHEN 650 MG RE SUPP: 650.0000 mg | Freq: Four times a day (QID) | RECTAL | Status: DC | PRN

## 2014-09-01 MED ORDER — FUROSEMIDE 40 MG PO TABS
80.0000 mg | ORAL_TABLET | Freq: Every day | ORAL | Status: DC
  Administered 2014-09-02 – 2014-09-06 (×5): 80 mg via ORAL
  Filled 2014-09-01 (×5): qty 2

## 2014-09-01 NOTE — Progress Notes (Signed)
PRE HD   09/01/14 1945  Neurological  Level of Consciousness Alert  Orientation Level Oriented X4  Respiratory  Respiratory Pattern Dyspnea at rest;Shallow  Chest Assessment Chest expansion symmetrical  Bilateral Breath Sounds Coarse crackles;Expiratory wheezes  Cardiac  Pulse Regular  ECG Monitor Yes  Cardiac Rhythm NSR;Heart block  Heart Block Type 1st degree AVB  Vascular  Edema Left lower extremity;Right lower extremity;Generalized  Generalized Edema Other (Comment) (NON PITTING)  RLE Edema +3  LLE Edema +3  Integumentary  Integumentary (WDL) X  Skin Color Appropriate for ethnicity  Skin Condition Dry;Flaky  Musculoskeletal  Musculoskeletal (WDL) X  Generalized Weakness Yes  Gastrointestinal  Bowel Sounds Assessment Active  GU Assessment  Genitourinary (WDL) X  Genitourinary Symptoms Other (Comment) (HD )  Psychosocial  Psychosocial (WDL) WDL  Emotional support given Given to patient

## 2014-09-01 NOTE — Progress Notes (Signed)
Pt taken to dialysis.  Pt told staff she refused the bed alarm, would like all lights and computers turned off at bedtime and would like the door closed at all times.  Pt encouraged to call staff for assistance with toileting.  Will continue to monitor when pt returns from HD.   Jessee Avers

## 2014-09-01 NOTE — Progress Notes (Signed)
PRE HD   09/01/14 1940  Vital Signs  Temp 98.3 F (36.8 C)  Temp Source Oral  Pulse Rate 79  Pulse Rate Source Monitor  Resp (!) 21  BP (!) 155/101 mmHg  BP Location Left Arm  BP Method Automatic  Patient Position (if appropriate) Lying  Oxygen Therapy  SpO2 100 %  O2 Device Nasal Cannula  O2 Flow Rate (L/min) 2 L/min  Pain Assessment  Pain Assessment No/denies pain  Pain Score 0  Dialysis Weight  Weight 90.9 kg (200 lb 6.4 oz)  Type of Weight Pre-Dialysis  Time-Out for Hemodialysis  What Procedure? HEMODIALYSIS  Pt Identifiers(min of two) First/Last Name;MRN/Account#  Correct Site? Yes  Correct Side? Yes  Correct Procedure? Yes  Consents Verified? Yes  Rad Studies Available? N/A  Safety Precautions Reviewed? Yes  Machine [employer] Number [id number]  Station Number 2  UF/Alarm Test Passed  Conductivity: Meter 13.8  Conductivity: Machine  13.9  pH 7.4  Reverse Osmosis MAIN  Dialyzer Lot Number [id number]  Disposable Set Lot Number [id number]  Machine Temperature 98.6 F (37 C)  Musician and Audible Yes  Blood Lines Intact and Secured Yes  Pre Treatment Patient Checks  Vascular access used during treatment Catheter  Date Hepatitis B Surface Antibody Drawn 09/01/14  Hemodialysis Consent Verified Yes  Hemodialysis Standing Orders Initiated Yes  ECG (Telemetry) Monitor On Yes  Prime Ordered Normal Saline  Length of  DialysisTreatment -hour(s) 3 Hour(s)  Dialysis Treatment Comments GOAL TO REMOVE 3L IN 3 HOURS. CVC ACCESSED WITHOUT DIFFICULTY. PT ALERT AND EXPERIENCING SOB.  VS STABLE.   Dialyzer Optiflux 180 NR  Dialysate (3K2.5CA)  Dialysis Anticoagulant None  Dialysate Flow Ordered 800  Blood Flow Rate Ordered 400 mL/min  Ultrafiltration Goal 3 Liters  Pre Treatment Labs Renal panel;CBC;Hepatitis B Surface Antigen  Dialysis Blood Pressure Support Ordered Normal Saline  Hemodialysis Catheter Right Subclavian Double-lumen  No Placement Date or Time  found.   Placed prior to admission: Yes  Orientation: Right  Access Location: Subclavian  Hemodialysis Catheter Type: Double-lumen  Site Condition No complications  Blue Lumen Status Heparin locked  Red Lumen Status Heparin locked  Purple Lumen Status N/A  Catheter fill solution Heparin 1000 units/ml  Catheter fill volume (Arterial) 2 cc  Catheter fill volume (Venous) 2.2  Dressing Type Occlusive  Dressing Status Clean;Dry;Intact  Interventions Dressing changed  Drainage Description None  Dressing Change Due 09/04/14

## 2014-09-01 NOTE — Progress Notes (Addendum)
Pt returned from HD very agitated and irritable.  3 liters removed. Pt still refusing bed alarm.  Pt only agreed to take 2200 scheduled medications, she refused medications from admission list.  Pt given a snack due to sugar being in the 70's.  When asked if she would eat a snack she replied, "whatever".  Will reassess and continue to monitor patient during shift.  Pt told to call for assistance before getting out of bed.   Kelsey Peterson

## 2014-09-01 NOTE — H&P (Signed)
[facility] NAME: Kelsey Peterson    MR#:  [id number]  DATE OF BIRTH:  [birth year]  DATE OF ADMISSION:  09/01/2014  PRIMARY CARE PHYSICIAN: No primary care provider on file.   REQUESTING/REFERRING PHYSICIAN: Orbie Pyo, MD  CHIEF COMPLAINT:   Chief Complaint  Patient presents with  . Respiratory Distress   shortness of breath 2 days  HISTORY OF PRESENT ILLNESS:  Kelsey Peterson  is a 59 y.o. female with a known history of hypertension, diabetes, ESRD and CAD. Patient has had shortness of breath for the past 2 days and worsening today. She also complains of bilateral leg edema and the weight gain. She missed hemodialysis this morning due to sickness. Her potassium is 6.0. Chest x-ray show fluid overload.  PAST MEDICAL HISTORY:   Past Medical History  Diagnosis Date  . COPD (chronic obstructive pulmonary disease)   . ESRD on hemodialysis   . DM2 (diabetes mellitus, type 2)   . Hepatitis C   . Migraine with aura   . HOH (hard of hearing)   . Mitral regurgitation     a. echo 06/2014: EF 60%, no WMA, GR1DD, mild AI, calcified mitral annulus, mod MR, no atrial septal defect or patent foramen ovale   . Cognitive impairment   . CAD (coronary artery disease)   . HTN (hypertension)     PAST SURGICAL HISTORY:   Past Surgical History  Procedure Laterality Date  . Dialysis fistula creation    . Tubal ligation    . Colonoscopy with propofol Left 07/09/2014    Procedure: COLONOSCOPY WITH PROPOFOL;  Surgeon: Hulen Luster, MD;  Location[facility] ENDOSCOPY;  Service: Endoscopy;  Laterality: Left;  . Colonoscopy N/A 07/15/2014    Procedure: COLONOSCOPY;  Surgeon: Manya Silvas, MD;  Location: [facility] ENDOSCOPY;  Service: Endoscopy;  Laterality: N/A;    SOCIAL HISTORY:   Social History  Substance Use Topics  . Smoking status: Never Smoker   . Smokeless tobacco: Never Used  . Alcohol Use: No    FAMILY HISTORY:    Family History  Problem Relation Age of Onset  . Heart disease Mother   . Hypertension Mother     DRUG ALLERGIES:   Allergies  Allergen Reactions  . Contrast Media [Iodinated Diagnostic Agents]   . Morphine And Related Hives  . Other Rash    Blood Pressure Pill (Pt doesn't remember name)     REVIEW OF SYSTEMS:  CONSTITUTIONAL: No fever, generalized weakness.  EYES: No blurred or double vision.  EARS, NOSE, AND THROAT: No tinnitus or ear pain.  RESPIRATORY: Has cough, shortness of breath, no wheezing or hemoptysis.  CARDIOVASCULAR: No chest pain, orthopnea, has leg edema.  GASTROINTESTINAL: No nausea, vomiting, diarrhea or abdominal pain.  GENITOURINARY: No dysuria, hematuria.  ENDOCRINE: No polyuria, nocturia,  HEMATOLOGY: No anemia, easy bruising or bleeding SKIN: No rash or lesion. MUSCULOSKELETAL: No joint pain or arthritis.   NEUROLOGIC: No tingling, numbness, weakness.  PSYCHIATRY: No anxiety or depression.   MEDICATIONS AT HOME:   Prior to Admission medications   Medication Sig Start Date End Date Taking? Authorizing Provider  albuterol (PROVENTIL HFA;VENTOLIN HFA) 108 (90 BASE) MCG/ACT inhaler Inhale 2 puffs into the lungs every 4 (four) hours as needed for wheezing or shortness of breath.    Yes Historical Provider, MD  amiodarone (PACERONE) 200 MG tablet Take 1 tablet (200 mg total) by mouth 2 (two) times daily. 07/10/14  Yes  Gladstone Lighter, MD  amLODipine (NORVASC) 5 MG tablet Take 5 mg by mouth daily.   Yes Historical Provider, MD  calcium acetate (PHOSLO) 667 MG capsule Take 1 capsule (667 mg total) by mouth 3 (three) times daily with meals. 07/10/14  Yes Gladstone Lighter, MD  calcium carbonate (TUMS - DOSED IN MG ELEMENTAL CALCIUM) 500 MG chewable tablet Chew 1 tablet by mouth daily.   Yes Historical Provider, MD  cinacalcet (SENSIPAR) 60 MG tablet Take 60 mg by mouth daily. With largest meal   Yes Historical Provider, MD  esomeprazole (NEXIUM) 40 MG  capsule Take 40 mg by mouth daily.    Yes Historical Provider, MD  ferrous sulfate 325 (65 FE) MG tablet Take 325 mg by mouth daily.   Yes Historical Provider, MD  folic acid-vitamin b complex-vitamin c-selenium-zinc (DIALYVITE) 3 MG TABS tablet Take 1 tablet by mouth daily.   Yes Historical Provider, MD  furosemide (LASIX) 80 MG tablet Take 80 mg by mouth daily. Patient takes on non dialysis days.   Yes Historical Provider, MD  isosorbide mononitrate (IMDUR) 30 MG 24 hr tablet Take 30 mg by mouth daily.   Yes Historical Provider, MD  Lidocaine-Prilocaine, Bulk, 9.7-9.8 % CREA 1 application by Other route as needed (for tx). Apply to site 20-45 minutes before tx.   Yes Historical Provider, MD  traMADol (ULTRAM) 50 MG tablet Take 50 mg by mouth 2 (two) times daily as needed for moderate pain.   Yes Historical Provider, MD  metoprolol tartrate (LOPRESSOR) 25 MG tablet Take 0.5 tablets (12.5 mg total) by mouth 2 (two) times daily. 07/10/14   Gladstone Lighter, MD      VITAL SIGNS:  Blood pressure 161/111, pulse 72, temperature 98.7 F (37.1 C), temperature source Oral, height 5\' 2"  (1.575 m), weight 87.998 kg (194 lb), SpO2 100 %.  PHYSICAL EXAMINATION:  GENERAL:  59 y.o.-year-old patient lying in the bed with no acute distress. Obese. EYES: Pupils equal, round, reactive to light and accommodation. No scleral icterus. Extraocular muscles intact.  HEENT: Head atraumatic, normocephalic. Oropharynx and nasopharynx clear.  NECK:  Supple, no jugular venous distention. No thyroid enlargement, no tenderness.  LUNGS: Normal breath sounds bilaterally, no wheezing bilateral basilar crackles. No use of accessory muscles of respiration.  CARDIOVASCULAR: S1, S2 normal. No murmurs, rubs, or gallops.  ABDOMEN: Soft, nontender, nondistended. Bowel sounds present. No organomegaly or mass.  EXTREMITIES: Bilateral lower extremity edema 2+, no cyanosis, or clubbing.  NEUROLOGIC: Cranial nerves II through XII are  intact. Muscle strength 5/5 in all extremities. Sensation intact. Gait not checked.  PSYCHIATRIC: The patient is alert and oriented x 3.  SKIN: No obvious rash, lesion, or ulcer.   LABORATORY PANEL:   CBC  Recent Labs Lab 09/01/14 1425  WBC 9.8  HGB 9.1*  HCT 27.5*  PLT 313   ------------------------------------------------------------------------------------------------------------------  Chemistries   Recent Labs Lab 09/01/14 1346  NA 138  K 6.0*  CL 96*  CO2 31  GLUCOSE 99  BUN 22*  CREATININE 6.36*  CALCIUM 8.2*   ------------------------------------------------------------------------------------------------------------------  Cardiac Enzymes No results for input(s): TROPONINI in the last 168 hours. ------------------------------------------------------------------------------------------------------------------  RADIOLOGY:  Dg Chest 2 View  09/01/2014   CLINICAL DATA:  59 year old female with history of shortness of breath since yesterday.  EXAM: CHEST  2 VIEW  COMPARISON:  07/06/2014.  FINDINGS: Lung volumes are low. Opacity at the left base likely to reflect a chronic moderate left pleural effusion with associated atelectasis in the left lower  lobe. There is cephalization of the pulmonary vasculature and slight indistinctness of the interstitial markings suggestive of mild pulmonary edema. Mild cardiomegaly. Upper mediastinal contours appear widened, favored to be related to vascular congestion.  IMPRESSION: 1. Findings suggestive of congestive heart failure, as above. 2. Moderate chronic left pleural effusion with associated passive subsegmental atelectasis in the left lower lobe. 3. Widening of the mediastinal contours, similar to some prior examinations, favored in this case to be related to vascular congestion.   Electronically Signed   By: Vinnie Langton M.D.   On: 09/01/2014 14:43    EKG:   Orders placed or performed during the hospital encounter of  09/01/14  . ED EKG  . ED EKG    IMPRESSION AND PLAN:   Hyperkalemia Fluid overload Hypertension, malignant ESRD COPD CAD Anemia of chronic disease  The patient will be admitted to medical floor with telemetry monitor. She will get emergent hemodialysis today per Dr. Juleen China. We will continue Lasix and follow up nephrology consult and BMP. I will continue patient's home hypertension medication with hydralazine IV when necessary.    All the records are reviewed and case discussed with ED provider. Management plans discussed with the patient, family and they are in agreement.  CODE STATUS: Full code  TOTAL TIME TAKING CARE OF THIS PATIENT: 56 minutes.    Demetrios Loll M.D on 09/01/2014 at 4:55 PM  Between 7am to 6pm - Pager - [phone]  After 6pm go to [email or web address] - password [email or web address] [facility]  [facility]  Office  [phone]  CC: Primary care physician; No primary care provider on file.

## 2014-09-01 NOTE — ED Provider Notes (Signed)
  Physical Exam  BP 161/111 mmHg  Pulse 72  Temp(Src) 98.7 F (37.1 C) (Oral)  Ht 5\' 2"  (1.575 m)  Wt 194 lb (87.998 kg)  BMI 35.47 kg/m2  SpO2 100%  Physical Exam Patient with mildly labored breathing. Rales to the mid fields. ED Course  Procedures  MDM ----------------------------------------- 4:35 PM on 09/01/2014 -----------------------------------------  Signout to follow-up with labs, reassess and disposition accordingly. Patient's labs show BNP greater than 4500 and potassium of 6. Also with fluid overload on the chest x-ray. No EKG changes. Discussed with Dr. Andrena Mews who will dialyze the patient "as soon as possible."  To admit. Signed out to Dr. Bridgett Larsson.      Orbie Pyo, MD 09/01/14 [phone]

## 2014-09-01 NOTE — Progress Notes (Signed)
[facility]  ROUNDING NOTE   Subjective:   Patient missed hemodialysis today. She states she was not feeling well and having nausea and vomiting. Presents with shortness of breath, hyperkalemia.  Placed on emergent hemodialysis.    Objective:  Vital signs in last 24 hours:  Temp:  [97.9 F (36.6 C)-98.7 F (37.1 C)] 98.3 F (36.8 C) (08/13 1940) Pulse Rate:  [72-85] 85 (08/13 2200) Resp:  [20-32] 20 (08/13 2200) BP: (153-183)/(88-112) 172/102 mmHg (08/13 2200) SpO2:  [92 %-100 %] 100 % (08/13 2130) Weight:  [87.998 kg (194 lb)-90.9 kg (200 lb 6.4 oz)] 90.9 kg (200 lb 6.4 oz) (08/13 1940)  Weight change:  Filed Weights   09/01/14 1400 09/01/14 1820 09/01/14 1940  Weight: 87.998 kg (194 lb) 89.721 kg (197 lb 12.8 oz) 90.9 kg (200 lb 6.4 oz)    Intake/Output:     Intake/Output this shift:     Physical Exam: General: NAD  Head: Normocephalic, atraumatic. Hard of hearing  Eyes: Anicteric, PERRL  Neck: Supple, trachea midline  Lungs:  Clear to auscultation  Heart: Regular rate and rhythm  Abdomen:  Soft, nontender,   Extremities: No peripheral edema.  Neurologic: Nonfocal, moving all four extremities  Skin: No lesions  Access: Right IJ permcath    Basic Metabolic Panel:  Recent Labs Lab 09/01/14 1346  NA 138  K 6.0*  CL 96*  CO2 31  GLUCOSE 99  BUN 22*  CREATININE 6.36*  CALCIUM 8.2*    Liver Function Tests: No results for input(s): AST, ALT, ALKPHOS, BILITOT, PROT, ALBUMIN in the last 168 hours. No results for input(s): LIPASE, AMYLASE in the last 168 hours. No results for input(s): AMMONIA in the last 168 hours.  CBC:  Recent Labs Lab 09/01/14 1425  WBC 9.8  HGB 9.1*  HCT 27.5*  MCV 82.1  PLT 313    Cardiac Enzymes: No results for input(s): CKTOTAL, CKMB, CKMBINDEX, TROPONINI in the last 168 hours.  BNP: Invalid input(s): POCBNP  CBG: No results for input(s): GLUCAP in the last 168 hours.  Microbiology: Results for  orders placed or performed during the hospital encounter of 07/14/14  MRSA PCR Screening     Status: Abnormal   Collection Time: 07/14/14  6:00 AM  Result Value Ref Range Status   MRSA by PCR POSITIVE (A) NEGATIVE Final    Comment:        The GeneXpert MRSA Assay (FDA approved for NASAL specimens only), is one component of a comprehensive MRSA colonization surveillance program. It is not intended to diagnose MRSA infection nor to guide or monitor treatment for MRSA infections. CRITICAL RESULT CALLED TO, READ BACK BY AND VERIFIED WITH: TAYLOR BALEY ON 07/14/14 AT 0730 BY JEF   C difficile quick scan w PCR reflex ([facility] only)     Status: None   Collection Time: 07/14/14  9:00 AM  Result Value Ref Range Status   C Diff antigen NEGATIVE  Final   C Diff toxin NEGATIVE  Final   C Diff interpretation Negative for C. difficile  Final    Coagulation Studies: No results for input(s): LABPROT, INR in the last 72 hours.  Urinalysis: No results for input(s): COLORURINE, LABSPEC, PHURINE, GLUCOSEU, HGBUR, BILIRUBINUR, KETONESUR, PROTEINUR, UROBILINOGEN, NITRITE, LEUKOCYTESUR in the last 72 hours.  Invalid input(s): APPERANCEUR    Imaging: Dg Chest 2 View  09/01/2014   CLINICAL DATA:  59 year old female with history of shortness of breath since yesterday.  EXAM: CHEST  2 VIEW  COMPARISON:  07/06/2014.  FINDINGS: Lung volumes are low. Opacity at the left base likely to reflect a chronic moderate left pleural effusion with associated atelectasis in the left lower lobe. There is cephalization of the pulmonary vasculature and slight indistinctness of the interstitial markings suggestive of mild pulmonary edema. Mild cardiomegaly. Upper mediastinal contours appear widened, favored to be related to vascular congestion.  IMPRESSION: 1. Findings suggestive of congestive heart failure, as above. 2. Moderate chronic left pleural effusion with associated passive subsegmental atelectasis in the left lower  lobe. 3. Widening of the mediastinal contours, similar to some prior examinations, favored in this case to be related to vascular congestion.   Electronically Signed   By: Vinnie Langton M.D.   On: 09/01/2014 14:43     Medications:     . amiodarone  200 mg Oral BID  . amLODipine  5 mg Oral Daily  . antiseptic oral rinse  7 mL Mouth Rinse BID  . [START ON 09/02/2014] calcium acetate  667 mg Oral TID WC  . calcium carbonate  1 tablet Oral Daily  . [START ON 09/02/2014] cinacalcet  60 mg Oral Q lunch  . ferrous sulfate  325 mg Oral Daily  . furosemide  80 mg Oral Daily  . heparin  5,000 Units Subcutaneous 3 times per day  . insulin aspart  0-5 Units Subcutaneous QHS  . [START ON 09/02/2014] insulin aspart  0-9 Units Subcutaneous TID WC  . isosorbide mononitrate  30 mg Oral Daily  . metoprolol tartrate  12.5 mg Oral BID  . multivitamin  1 tablet Oral QHS  . pantoprazole  40 mg Oral Daily  . sodium chloride  3 mL Intravenous Q12H   sodium chloride, acetaminophen **OR** acetaminophen, albuterol, hydrALAZINE, lidocaine-prilocaine, ondansetron **OR** ondansetron (ZOFRAN) IV, sodium chloride, traMADol  Assessment/ Plan:  Ms. 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   1. End Stage Renal Disease: emergent hemodialysis today due to pulmonary edema and hyperkalemia. Tolerating treatment well. Ultrafiltration goal of 3 litres.  - will monitor for dialysis need.   2. Hypertension: hypertensive emergency. Seems to have not taken her home medications. Blood pressure is also volume driven.  - Restart home regimen: furosemide, amlodipine, isorbide mononitrate and metoprolol.   3. Anemia of chronic kidney  disease: hemoglobin 9.1 - no epo: concern for malignancy. Will need CT - iron supplements.   4. Secondary Hyperparathyroidism: Most recent outpatient phosphorus of 3.1, PTH  Low at 67. Also with history of hypocalcemia.  - hold cinacalcet  - Currently phos binders: Tums and phoslo   LOS: 0 Kairyn Olmeda 8/13/201610:12 PM

## 2014-09-01 NOTE — Progress Notes (Signed)
Pt refuses bed alarm - encouraged to call out for help before getting out of bed/ close to nurses station/ call bell within reach

## 2014-09-01 NOTE — Progress Notes (Signed)
POST HD   09/01/14 2300  Vital Signs  Temp 98.6 F (37 C)  Temp Source Oral  Pulse Rate 84  Pulse Rate Source Monitor  Resp 18  BP (!) 155/92 mmHg  BP Location Left Arm  BP Method Automatic  Patient Position (if appropriate) Lying  Dialysis Weight  Weight 87.4 kg (192 lb 10.9 oz)  Type of Weight Post-Dialysis  During Hemodialysis Assessment  Intra-Hemodialysis Comments HD TREATMENT END.  BLOOD RETURNED AND CVC ACCESSED PER POLICY.  PT ALERT AND VS STABLE. PT BREATHING HAS IMPROVED.   Post-Hemodialysis Assessment  Rinseback Volume (mL) 250 mL  Dialyzer Clearance Heavily streaked  Duration of HD Treatment -hour(s) 3 hour(s)  Hemodialysis Intake (mL) 500 mL  UF Total -Machine (mL) 3500 mL  Net UF (mL) 3000 mL  Tolerated HD Treatment Yes  Post-Hemodialysis Comments HD TREATMENT END.  BLOOD RETURNED AND CVC ACCESSED PER POLICY.  PT ALERT AND VS STABLE. PT BREATHING HAS IMPROVED.   Education / Care Plan  Hemodialysis Education Provided Yes  Documented Education in Clinical Pathway Yes  Hemodialysis Catheter Right Subclavian Double-lumen  No Placement Date or Time found.   Placed prior to admission: Yes  Orientation: Right  Access Location: Subclavian  Hemodialysis Catheter Type: Double-lumen  Site Condition No complications  Blue Lumen Status Heparin locked  Red Lumen Status Heparin locked  Purple Lumen Status N/A  Catheter fill solution Heparin 1000 units/ml  Catheter fill volume (Arterial) 2 cc  Catheter fill volume (Venous) 2.2  Dressing Type Occlusive  Dressing Status Clean;Dry;Intact  Drainage Description None  Dressing Change Due 09/04/14  Post treatment catheter status Capped and Clamped

## 2014-09-01 NOTE — ED Provider Notes (Signed)
[facility] Emergency Department Provider Note    ____________________________________________  Time seen: 1410  I have reviewed the triage vital signs and the nursing notes.   HISTORY  Chief Complaint Respiratory Distress   History limited by: Not Limited   HPI Kelsey Peterson is a 59 y.o. female with history of dialysis who presents to the emergency department today because of feeling unwell. She states she has been feeling well for the past 2 days. Her symptoms include nausea and shortness of breath. She states that the shortness of breath was worse when she lies flat. She feels that things start closing on her when she lies back. She denies any fevers. She states that she was not able to go to dialysis this morning. Her last dialysis was Thursday.     Past Medical History  Diagnosis Date  . COPD (chronic obstructive pulmonary disease)   . ESRD on hemodialysis   . DM2 (diabetes mellitus, type 2)   . Hepatitis C   . Migraine with aura   . HOH (hard of hearing)   . Mitral regurgitation     a. echo 06/2014: EF 60%, no WMA, GR1DD, mild AI, calcified mitral annulus, mod MR, no atrial septal defect or patent foramen ovale   . Cognitive impairment   . CAD (coronary artery disease)   . HTN (hypertension)     Patient Active Problem List   Diagnosis Date Noted  . GI bleed 07/14/2014  . GERD (gastroesophageal reflux disease) 07/14/2014  . COPD (chronic obstructive pulmonary disease) 07/14/2014  . HTN (hypertension) 07/14/2014  . CAD (coronary artery disease) 07/14/2014  . End stage renal disease on dialysis   . Supraventricular tachycardia   . NSVT (nonsustained ventricular tachycardia)   . Pulmonary edema 07/03/2014  . Anemia 07/03/2014  . Pain in the chest   . Diarrhea   . NSTEMI (non-ST elevated myocardial infarction) 06/21/2014    Past Surgical History  Procedure Laterality Date  . Dialysis fistula creation    . Tubal ligation    .  Colonoscopy with propofol Left 07/09/2014    Procedure: COLONOSCOPY WITH PROPOFOL;  Surgeon: Hulen Luster, MD;  Location: [facility] ENDOSCOPY;  Service: Endoscopy;  Laterality: Left;  . Colonoscopy N/A 07/15/2014    Procedure: COLONOSCOPY;  Surgeon: Manya Silvas, MD;  Location: [facility] ENDOSCOPY;  Service: Endoscopy;  Laterality: N/A;    Current Outpatient Rx  Name  Route  Sig  Dispense  Refill  . albuterol (PROVENTIL HFA;VENTOLIN HFA) 108 (90 BASE) MCG/ACT inhaler   Inhalation   Inhale 2 puffs into the lungs every 4 (four) hours as needed for wheezing or shortness of breath.          Marland Kitchen amiodarone (PACERONE) 200 MG tablet   Oral   Take 1 tablet (200 mg total) by mouth 2 (two) times daily.   60 tablet   1   . amLODipine (NORVASC) 5 MG tablet   Oral   Take 5 mg by mouth daily.         . calcium acetate (PHOSLO) 667 MG capsule   Oral   Take 1 capsule (667 mg total) by mouth 3 (three) times daily with meals. Patient not taking: Reported on 07/14/2014   90 capsule   1   . calcium carbonate (TUMS - DOSED IN MG ELEMENTAL CALCIUM) 500 MG chewable tablet   Oral   Chew 1 tablet by mouth daily.         . cinacalcet (  SENSIPAR) 60 MG tablet   Oral   Take 60 mg by mouth daily. With largest meal         . esomeprazole (NEXIUM) 40 MG capsule   Oral   Take 40 mg by mouth daily.          . ferrous sulfate 325 (65 FE) MG tablet   Oral   Take 325 mg by mouth daily.         . folic acid-vitamin b complex-vitamin c-selenium-zinc (DIALYVITE) 3 MG TABS tablet   Oral   Take 1 tablet by mouth daily.         . furosemide (LASIX) 80 MG tablet   Oral   Take 80 mg by mouth daily. Patient takes on non dialysis days.         . isosorbide mononitrate (IMDUR) 30 MG 24 hr tablet   Oral   Take 30 mg by mouth daily.         . Lidocaine-Prilocaine, Bulk, 2.5-2.5 % CREA   Other   1 application by Other route as needed (for tx). Apply to site 20-45 minutes before tx.         .  metoprolol tartrate (LOPRESSOR) 25 MG tablet   Oral   Take 0.5 tablets (12.5 mg total) by mouth 2 (two) times daily.   30 tablet   1   . traMADol (ULTRAM) 50 MG tablet   Oral   Take 50 mg by mouth 2 (two) times daily as needed for moderate pain.           Allergies Contrast media; Morphine and related; and Other  Family History  Problem Relation Age of Onset  . Heart disease Mother   . Hypertension Mother     Social History Social History  Substance Use Topics  . Smoking status: Never Smoker   . Smokeless tobacco: Never Used  . Alcohol Use: No    Review of Systems  Constitutional: Negative for fever. Positive for chills Cardiovascular: Negative for chest pain. Respiratory: Negative for shortness of breath. Gastrointestinal: Positive for emesis Genitourinary: Negative for dysuria. Musculoskeletal: Negative for back pain. Skin: Negative for rash. Neurological: Negative for headaches, focal weakness or numbness.   10-point ROS otherwise negative.  ____________________________________________   PHYSICAL EXAM:  VITAL SIGNS: ED Triage Vitals  Enc Vitals Group     BP 09/01/14 1400 161/111 mmHg     Pulse Rate 09/01/14 1400 72     Resp --      Temp 09/01/14 1400 98.7 F (37.1 C)     Temp Source 09/01/14 1400 Oral     SpO2 09/01/14 1400 100 %     Weight 09/01/14 1400 194 lb (87.998 kg)     Height 09/01/14 1400 5\' 2"  (1.575 m)   Constitutional: Alert and oriented. Occasional dry heave Eyes: Conjunctivae are normal. PERRL. Normal extraocular movements. ENT   Head: Normocephalic and atraumatic.   Nose: No congestion/rhinnorhea.   Mouth/Throat: Mucous membranes are moist.   Neck: No stridor. Hematological/Lymphatic/Immunilogical: No cervical lymphadenopathy. Cardiovascular: Normal rate, regular rhythm.  No murmurs, rubs, or gallops. Respiratory: Normal respiratory effort without tachypnea nor retractions. Minimal crackles present in bilateral  bases. Gastrointestinal: Soft and nontender. No distention.  Genitourinary: Deferred Musculoskeletal: Normal range of motion in all extremities. No joint effusions.  No lower extremity tenderness nor edema. Neurologic:  Normal speech and language. No gross focal neurologic deficits are appreciated. Speech is normal.  Skin:  Skin is warm, dry and intact.  No rash noted. Hemodialysis catheter in right upper chest Psychiatric: Mood and affect are normal. Speech and behavior are normal. Patient exhibits appropriate insight and judgment.  ____________________________________________    LABS (pertinent positives/negatives)  Pending  ____________________________________________   EKG  I, Nance Pear, attending physician, personally viewed and interpreted this EKG  EKG Time: 1402 Rate: 72 Rhythm: sinus rhythm with 1st degree av block Axis: normal Intervals: qtc 481 QRS: narrow ST changes: no st elevation  ____________________________________________    RADIOLOGY  CXR  IMPRESSION: 1. Findings suggestive of congestive heart failure, as above. 2. Moderate chronic left pleural effusion with associated passive subsegmental atelectasis in the left lower lobe. 3. Widening of the mediastinal contours, similar to some prior examinations, favored in this case to be related to vascular congestion.  ____________________________________________   PROCEDURES  Procedure(s) performed: None  Critical Care performed: No  ____________________________________________   INITIAL IMPRESSION / ASSESSMENT AND PLAN / ED COURSE  Pertinent labs & imaging results that were available during my care of the patient were reviewed by me and considered in my medical decision making (see chart for details).  Patient presents to the emergency department today because she is feeling unwell. She does have symptoms of shortness of breath. Patient is a dialysis patient however was not able to make it  to dialysis today. Chest x-ray is somewhat concerning for fluid overload. Currently awaiting BMP. This patient patient will likely require admission.  ____________________________________________   FINAL CLINICAL IMPRESSION(S) / ED DIAGNOSES  Final diagnoses:  Congestive heart failure, unspecified congestive heart failure chronicity, unspecified congestive heart failure type  Hyperkalemia     Nance Pear, MD 09/02/14 1658

## 2014-09-01 NOTE — Progress Notes (Signed)
Post dialysis vitals

## 2014-09-01 NOTE — ED Notes (Signed)
Patient is from home with [facility] since yesterday. Patient is a dialysis patient who missed her HD appointment this morning. Patient is A+Ox4 and speaking in complete sentences

## 2014-09-02 ENCOUNTER — Encounter: Payer: Self-pay | Admitting: Radiology

## 2014-09-02 ENCOUNTER — Inpatient Hospital Stay: Payer: Medicare Other

## 2014-09-02 LAB — BASIC METABOLIC PANEL
Anion gap: 10 (ref 5–15)
BUN: 15 mg/dL (ref 6–20)
CALCIUM: 7.6 mg/dL — AB (ref 8.9–10.3)
CO2: 33 mmol/L — AB (ref 22–32)
Chloride: 96 mmol/L — ABNORMAL LOW (ref 101–111)
Creatinine, Ser: 4.98 mg/dL — ABNORMAL HIGH (ref 0.44–1.00)
GFR calc Af Amer: 10 mL/min — ABNORMAL LOW (ref >60)
GFR calc non Af Amer: 9 mL/min — ABNORMAL LOW (ref >60)
GLUCOSE: 89 mg/dL (ref 65–99)
POTASSIUM: 5.3 mmol/L — AB (ref 3.5–5.1)
SODIUM: 139 mmol/L (ref 135–145)

## 2014-09-02 LAB — CBC
HEMATOCRIT: 26.4 % — AB (ref 35.0–47.0)
HEMOGLOBIN: 8.3 g/dL — AB (ref 12.0–16.0)
MCH: 25.9 pg — AB (ref 26.0–34.0)
MCHC: 31.6 g/dL — AB (ref 32.0–36.0)
MCV: 82 fL (ref 80.0–100.0)
PLATELETS: 240 10*3/uL (ref 150–440)
RBC: 3.22 MIL/uL — AB (ref 3.80–5.20)
RDW: 20.4 % — AB (ref 11.5–14.5)
WBC: 13.3 10*3/uL — AB (ref 3.6–11.0)

## 2014-09-02 LAB — GLUCOSE, CAPILLARY: Glucose-Capillary: 72 mg/dL (ref 65–99)

## 2014-09-02 MED ORDER — AZITHROMYCIN 250 MG PO TABS
500.0000 mg | ORAL_TABLET | Freq: Every day | ORAL | Status: AC
  Administered 2014-09-02: 250 mg via ORAL
  Filled 2014-09-02: qty 2

## 2014-09-02 MED ORDER — DEXTROSE 5 % IV SOLN
1.0000 g | INTRAVENOUS | Status: DC
  Administered 2014-09-02 – 2014-09-05 (×4): 1 g via INTRAVENOUS
  Filled 2014-09-02 (×6): qty 10

## 2014-09-02 MED ORDER — AZITHROMYCIN 250 MG PO TABS
250.0000 mg | ORAL_TABLET | Freq: Every day | ORAL | Status: AC
  Administered 2014-09-03 – 2014-09-06 (×4): 250 mg via ORAL
  Filled 2014-09-02 (×4): qty 1

## 2014-09-02 NOTE — Progress Notes (Signed)
[place] at [place] NAME: Kelsey Peterson    MR#:  [id number]  DATE OF BIRTH:  Jan 30, 1955  SUBJECTIVE: Admitted for shortness of breath hyperkalemia. Received emergency hemodialysis yesterday. Potassium was 6 on admission. Patient breathing comfortably. No chest pain. Concerned that she has lung cancer. CT chest is ordered.   CHIEF COMPLAINT:   Chief Complaint  Patient presents with  . Respiratory Distress    REVIEW OF SYSTEMS:    ROS  Nutrition: Tolerating Diet: Tolerating PT:      DRUG ALLERGIES:   Allergies  Allergen Reactions  . Contrast Media [Iodinated Diagnostic Agents]   . Morphine And Related Hives  . Other Rash    Blood Pressure Pill (Pt doesn't remember name)     VITALS:  Blood pressure 134/89, pulse 79, temperature 98.5 F (36.9 C), temperature source Oral, resp. rate 18, height 5\' 2"  (1.575 m), weight 85.6 kg (188 lb 11.4 oz), SpO2 98 %.  PHYSICAL EXAMINATION:   Physical Exam  GENERAL:  59 y.o.-year-old patient lying in the bed with no acute distress.  EYES: Pupils equal, round, reactive to light and accommodation. No scleral icterus. Extraocular muscles intact.  HEENT: Head atraumatic, normocephalic. Oropharynx and nasopharynx clear.  NECK:  Supple, no jugular venous distention. No thyroid enlargement, no tenderness.  LUNGS: Bilateral coarse breath sounds present. t CARDIOVASCULAR: S1, S2 normal. No murmurs, rubs, or gallops.  ABDOMEN: Soft, nontender, nondistended. Bowel sounds present. No organomegaly or mass.  EXTREMITIES: No pedal edema, cyanosis, or clubbing.  NEUROLOGIC: Cranial nerves II through XII are intact. Muscle strength 5/5 in all extremities. Sensation intact. Gait not checked.  PSYCHIATRIC: The patient is alert and oriented x 3.  SKIN: No obvious rash, lesion, or ulcer.    LABORATORY PANEL:   CBC  Recent Labs Lab 09/02/14 0441  WBC 13.3*  HGB 8.3*  HCT 26.4*  PLT 240    ------------------------------------------------------------------------------------------------------------------  Chemistries   Recent Labs Lab 09/02/14 0441  NA 139  K 5.3*  CL 96*  CO2 33*  GLUCOSE 89  BUN 15  CREATININE 4.98*  CALCIUM 7.6*   ------------------------------------------------------------------------------------------------------------------  Cardiac Enzymes No results for input(s): TROPONINI in the last 168 hours. ------------------------------------------------------------------------------------------------------------------  RADIOLOGY:  Dg Chest 2 View  09/01/2014   CLINICAL DATA:  59 year old female with history of shortness of breath since yesterday.  EXAM: CHEST  2 VIEW  COMPARISON:  07/06/2014.  FINDINGS: Lung volumes are low. Opacity at the left base likely to reflect a chronic moderate left pleural effusion with associated atelectasis in the left lower lobe. There is cephalization of the pulmonary vasculature and slight indistinctness of the interstitial markings suggestive of mild pulmonary edema. Mild cardiomegaly. Upper mediastinal contours appear widened, favored to be related to vascular congestion.  IMPRESSION: 1. Findings suggestive of congestive heart failure, as above. 2. Moderate chronic left pleural effusion with associated passive subsegmental atelectasis in the left lower lobe. 3. Widening of the mediastinal contours, similar to some prior examinations, favored in this case to be related to vascular congestion.   Electronically Signed   By: Vinnie Langton M.D.   On: 09/01/2014 14:43   Ct Chest Wo Contrast  09/02/2014   CLINICAL DATA:  59 year old female with increasing shortness of breath for 2 days. End-stage renal disease, hypertension, diabetes. Initial encounter.  EXAM: CT CHEST WITHOUT CONTRAST  TECHNIQUE: Multidetector CT imaging of the chest was performed following the standard protocol without IV contrast.  COMPARISON:  Chest  radiographs 09/01/2014 and earlier. CT Abdomen and Pelvis 01/18/2014. Chest CTA 09/24/2013.  FINDINGS: Right IJ approach dual lumen dialysis type catheter in place. Major airways are patent. Small to moderate left and small right layering pleural effusions. Cardiomegaly. No pericardial effusion.  Superimposed bilateral perihilar crowding of lung markings, and in the inferior left upper lobe, lingula, and much of the left lower lobe there is consolidation with air bronchograms (coronal image 92). Occasional patchy peribronchovascular opacity in the right lung (series 3, image 29).  No mediastinal or axillary lymphadenopathy. Mildly increased subcutaneous fat stranding throughout.  Cholelithiasis, with gallstones up to 26 mm diameter. Negative visualized noncontrast liver, spleen, pancreas, and bowel in the upper abdomen. Native renal atrophy and adrenal hyperplasia.  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 Benign vertebral body hemangioma at L1. At T9-T10 there is new moderate to severe endplate irregularity and disc space loss. There is associated endplate osteophytosis. There is no paraspinal phlegmon identified. This appearance is new since December 2015. Otherwise stable osseous structures.  IMPRESSION: 1. Cardiomegaly with a degree of volume overload, including left greater than right layering pleural effusions. Multilobar consolidation on the left more suggestive of pneumonia than atelectasis. 2. Abnormal appearance of the T9-T10 disc and endplates is new since December 2015. No paraspinal inflammation to strongly suggest acute discitis osteomyelitis. But if there is thoracic back pain or bacteremia then recommend follow-up noncontrast Thoracic MRI. 3. Cholelithiasis.   Electronically Signed   By: Genevie Ann M.D.   On: 09/02/2014 10:27     ASSESSMENT AND PLAN:   Principal Problem:   Hyperkalemia Active Problems:   Fluid overload   Malignant hypertension   #1 Acute respiratory  failure secondary to hyperkalemia pulmonary edema and a contest of missed hemodialysis. Patient received emergency hemodialysis yesterday. Potassium improved to 5.3 #2 ESRD on hemodialysis 3. Acute respiratory failure secondary to pneumonia CT chest confirmed the multilobar pneumonia.start  IV antibiotics.\,continue oxygen  4.htn;controlled. The hyperparathyroidism   anemia of chronic kidney disease  history of MSSA bacteremia All the records are reviewed and case discussed with Care Management/Social Workerr. Management plans discussed with the patient, family and they are in agreement.  CODE STATUS: full  TOTAL TIME TAKING CARE OF THIS PATIENT: 35 minutes.   POSSIBLE D/C IN 1-2 DAYS, DEPENDING ON CLINICAL CONDITION.   [name] M.D on 09/02/2014 at 2:22 PM  Between 7am to 6pm - Pager - [phone]  After 6pm go to [email or web address] - password [email or web address] [facility]  [facility]  Office  [phone]  CC: Primary care physician; No primary care provider on file.

## 2014-09-02 NOTE — Progress Notes (Signed)
Pt still refusing bed alarm.  Pt re-educated again on importance of calling for assistance if she needs to get up.  Will continue to monitor.   Jessee Avers

## 2014-09-02 NOTE — Progress Notes (Signed)
[facility]  ROUNDING NOTE   Subjective:   Emergent hemodialysis yesterday. UF of 3 litres. Today patient is breathing more comfortably.  CT - chest pending. Patient is concerned she has cancer.   Objective:  Vital signs in last 24 hours:  Temp:  [97.9 F (36.6 C)-99.1 F (37.3 C)] 98.9 F (37.2 C) (08/14 0800) Pulse Rate:  [72-88] 88 (08/14 0800) Resp:  [18-32] 20 (08/14 0800) BP: (148-183)/(87-112) 150/90 mmHg (08/14 0800) SpO2:  [92 %-100 %] 98 % (08/14 0800) Weight:  [85.6 kg (188 lb 11.4 oz)-90.9 kg (200 lb 6.4 oz)] 85.6 kg (188 lb 11.4 oz) (08/13 2327)  Weight change:  Filed Weights   09/01/14 1940 09/01/14 2300 09/01/14 2327  Weight: 90.9 kg (200 lb 6.4 oz) 87.4 kg (192 lb 10.9 oz) 85.6 kg (188 lb 11.4 oz)    Intake/Output: I/O last 3 completed shifts: In: -  Out: 3000 [Other:3000]   Intake/Output this shift:     Physical Exam: General: NAD  Head: Normocephalic, atraumatic. Hard of hearing  Eyes: Anicteric, PERRL  Neck: Supple, trachea midline  Lungs:  Clear to auscultation  Heart: Regular rate and rhythm  Abdomen:  Soft, nontender,   Extremities: No peripheral edema.  Neurologic: Nonfocal, moving all four extremities  Skin: No lesions  Access: Right IJ permcath    Basic Metabolic Panel:  Recent Labs Lab 09/01/14 1346 09/02/14 0441  NA 138 139  K 6.0* 5.3*  CL 96* 96*  CO2 31 33*  GLUCOSE 99 89  BUN 22* 15  CREATININE 6.36* 4.98*  CALCIUM 8.2* 7.6*    Liver Function Tests: No results for input(s): AST, ALT, ALKPHOS, BILITOT, PROT, ALBUMIN in the last 168 hours. No results for input(s): LIPASE, AMYLASE in the last 168 hours. No results for input(s): AMMONIA in the last 168 hours.  CBC:  Recent Labs Lab 09/01/14 1425 09/02/14 0441  WBC 9.8 13.3*  HGB 9.1* 8.3*  HCT 27.5* 26.4*  MCV 82.1 82.0  PLT 313 240    Cardiac Enzymes: No results for input(s): CKTOTAL, CKMB, CKMBINDEX, TROPONINI in the last 168  hours.  BNP: Invalid input(s): POCBNP  CBG:  Recent Labs Lab 09/01/14 2329  GLUCAP 45    Microbiology: Results for orders placed or performed during the hospital encounter of 07/14/14  MRSA PCR Screening     Status: Abnormal   Collection Time: 07/14/14  6:00 AM  Result Value Ref Range Status   MRSA by PCR POSITIVE (A) NEGATIVE Final    Comment:        The GeneXpert MRSA Assay (FDA approved for NASAL specimens only), is one component of a comprehensive MRSA colonization surveillance program. It is not intended to diagnose MRSA infection nor to guide or monitor treatment for MRSA infections. CRITICAL RESULT CALLED TO, READ BACK BY AND VERIFIED WITH: TAYLOR BALEY ON 07/14/14 AT 0730 BY JEF   C difficile quick scan w PCR reflex ([facility] only)     Status: None   Collection Time: 07/14/14  9:00 AM  Result Value Ref Range Status   C Diff antigen NEGATIVE  Final   C Diff toxin NEGATIVE  Final   C Diff interpretation Negative for C. difficile  Final    Coagulation Studies: No results for input(s): LABPROT, INR in the last 72 hours.  Urinalysis: No results for input(s): COLORURINE, LABSPEC, PHURINE, GLUCOSEU, HGBUR, BILIRUBINUR, KETONESUR, PROTEINUR, UROBILINOGEN, NITRITE, LEUKOCYTESUR in the last 72 hours.  Invalid input(s): APPERANCEUR    Imaging: Dg  Chest 2 View  09/01/2014   CLINICAL DATA:  59 year old female with history of shortness of breath since yesterday.  EXAM: CHEST  2 VIEW  COMPARISON:  07/06/2014.  FINDINGS: Lung volumes are low. Opacity at the left base likely to reflect a chronic moderate left pleural effusion with associated atelectasis in the left lower lobe. There is cephalization of the pulmonary vasculature and slight indistinctness of the interstitial markings suggestive of mild pulmonary edema. Mild cardiomegaly. Upper mediastinal contours appear widened, favored to be related to vascular congestion.  IMPRESSION: 1. Findings suggestive of congestive heart  failure, as above. 2. Moderate chronic left pleural effusion with associated passive subsegmental atelectasis in the left lower lobe. 3. Widening of the mediastinal contours, similar to some prior examinations, favored in this case to be related to vascular congestion.   Electronically Signed   By: Vinnie Langton M.D.   On: 09/01/2014 14:43   Ct Chest Wo Contrast  09/02/2014   CLINICAL DATA:  59 year old female with increasing shortness of breath for 2 days. End-stage renal disease, hypertension, diabetes. Initial encounter.  EXAM: CT CHEST WITHOUT CONTRAST  TECHNIQUE: Multidetector CT imaging of the chest was performed following the standard protocol without IV contrast.  COMPARISON:  Chest radiographs 09/01/2014 and earlier. CT Abdomen and Pelvis 01/18/2014. Chest CTA 09/24/2013.  FINDINGS: Right IJ approach dual lumen dialysis type catheter in place. Major airways are patent. Small to moderate left and small right layering pleural effusions. Cardiomegaly. No pericardial effusion.  Superimposed bilateral perihilar crowding of lung markings, and in the inferior left upper lobe, lingula, and much of the left lower lobe there is consolidation with air bronchograms (coronal image 92). Occasional patchy peribronchovascular opacity in the right lung (series 3, image 29).  No mediastinal or axillary lymphadenopathy. Mildly increased subcutaneous fat stranding throughout.  Cholelithiasis, with gallstones up to 26 mm diameter. Negative visualized noncontrast liver, spleen, pancreas, and bowel in the upper abdomen. Native renal atrophy and adrenal hyperplasia.  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 Benign vertebral body hemangioma at L1. At T9-T10 there is new moderate to severe endplate irregularity and disc space loss. There is associated endplate osteophytosis. There is no paraspinal phlegmon identified. This appearance is new since December 2015. Otherwise stable osseous structures.   IMPRESSION: 1. Cardiomegaly with a degree of volume overload, including left greater than right layering pleural effusions. Multilobar consolidation on the left more suggestive of pneumonia than atelectasis. 2. Abnormal appearance of the T9-T10 disc and endplates is new since December 2015. No paraspinal inflammation to strongly suggest acute discitis osteomyelitis. But if there is thoracic back pain or bacteremia then recommend follow-up noncontrast Thoracic MRI. 3. Cholelithiasis.   Electronically Signed   By: Genevie Ann M.D.   On: 09/02/2014 10:27     Medications:     . amiodarone  200 mg Oral BID  . amLODipine  5 mg Oral Daily  . antiseptic oral rinse  7 mL Mouth Rinse BID  . calcium acetate  667 mg Oral TID WC  . calcium carbonate  1 tablet Oral Daily  . ferrous sulfate  325 mg Oral Daily  . furosemide  80 mg Oral Daily  . heparin  5,000 Units Subcutaneous 3 times per day  . insulin aspart  0-5 Units Subcutaneous QHS  . insulin aspart  0-9 Units Subcutaneous TID WC  . isosorbide mononitrate  30 mg Oral Daily  . metoprolol tartrate  12.5 mg Oral BID  . multivitamin  1 tablet  Oral QHS  . pantoprazole  40 mg Oral Daily  . sodium chloride  3 mL Intravenous Q12H   sodium chloride, acetaminophen **OR** acetaminophen, albuterol, hydrALAZINE, lidocaine-prilocaine, ondansetron **OR** ondansetron (ZOFRAN) IV, sodium chloride, traMADol  Assessment/ Plan:  [name] is a 59 y.o.black  female with hypertension, anemia of chronic kidney disease, ESRD on HD TTHS, secondary hyperparathyroidism, hx of meningitis as a child with resultant hearing loss, hx of lytic lesions in ileum followed by oncology, h/o MSSA bacteremia, colonoscopy 06/2014- polyps removed  TTS [place].   1. End Stage Renal Disease: emergent hemodialysis last night due to pulmonary edema and hyperkalemia. Tolerated treatment well. Ultrafiltration of 3 litres.  - will monitor for dialysis need.  - potassium  5.3 today.   2. Hypertension: hypertensive emergency last night. Seems to have not taken her home medications. Blood pressure is also volume driven. Blood pressure better controlled after dialysis.   - Restarted home regimen: furosemide, amlodipine, isorbide mononitrate and metoprolol.   3. Anemia of chronic kidney disease: hemoglobin 8.3 - no epo: concern for malignancy. CT pending.  - iron supplements.   4. Secondary Hyperparathyroidism: Most recent outpatient phosphorus of 3.1, PTH  Low at 67. Also with history of hypocalcemia.  - hold cinacalcet  - Currently phos binders: Tums and phoslo   LOS: 1 Panagiotis Oelkers 8/14/201610:31 AM

## 2014-09-03 LAB — HEPATITIS B SURFACE ANTIBODY,QUALITATIVE: HEP B S AB: REACTIVE

## 2014-09-03 LAB — HEPATITIS B SURFACE ANTIGEN: HEP B S AG: NEGATIVE

## 2014-09-03 LAB — MRSA PCR SCREENING: MRSA by PCR: POSITIVE — AB

## 2014-09-03 LAB — HEPATITIS B CORE ANTIBODY, TOTAL: HEP B C TOTAL AB: NEGATIVE

## 2014-09-03 MED ORDER — ZOLPIDEM TARTRATE 5 MG PO TABS
5.0000 mg | ORAL_TABLET | Freq: Every evening | ORAL | Status: DC | PRN
  Administered 2014-09-03: 5 mg via ORAL
  Filled 2014-09-03: qty 1

## 2014-09-03 MED ORDER — ALBUTEROL SULFATE (2.5 MG/3ML) 0.083% IN NEBU
2.5000 mg | INHALATION_SOLUTION | Freq: Four times a day (QID) | RESPIRATORY_TRACT | Status: DC
  Administered 2014-09-03 – 2014-09-05 (×8): 2.5 mg via RESPIRATORY_TRACT
  Filled 2014-09-03 (×9): qty 3

## 2014-09-03 NOTE — Progress Notes (Signed)
Patient lying in bed, no distress or pain

## 2014-09-03 NOTE — Progress Notes (Signed)
Per call from the Lab, patient is positive for MRSA

## 2014-09-03 NOTE — Progress Notes (Signed)
Telemetry pulled from patient and turned in.  Patient resting comfortably in bed.

## 2014-09-03 NOTE — Progress Notes (Addendum)
Patient lying in bed eating breakfast, no distress or pain.  Will give mouth rinse after she finishes eating.  Refused fall socks and also does not want bed alarm on--light is too bright.  Will call nurses station when she needs assistance

## 2014-09-03 NOTE — Progress Notes (Signed)
[facility] [place] NAME: Kelsey Peterson    MR#:  [id number]  DATE OF BIRTH:  [birth year]  SUBJECTIVE: seen at bedside. complains of wheezing. Denies any other complaints. Patient is very hard of hearing, has some difficulty understanding.   CHIEF COMPLAINT:   Chief Complaint  Patient presents with  . Respiratory Distress    REVIEW OF SYSTEMS:    Review of Systems  Constitutional: Negative for fever, chills, weight loss, malaise/fatigue and diaphoresis.  HENT: Negative for hearing loss.   Eyes: Negative for blurred vision, double vision and photophobia.  Respiratory: Positive for shortness of breath and wheezing. Negative for cough, hemoptysis and sputum production.   Cardiovascular: Negative for chest pain, palpitations, orthopnea and leg swelling.  Gastrointestinal: Negative for heartburn, nausea, vomiting, abdominal pain and diarrhea.  Genitourinary: Negative for dysuria and urgency.  Musculoskeletal: Negative for myalgias and neck pain.  Skin: Negative for rash.  Neurological: Negative for dizziness, focal weakness, seizures, weakness and headaches.  Endo/Heme/Allergies: Does not bruise/bleed easily.  Psychiatric/Behavioral: Negative for memory loss. The patient does not have insomnia.     Nutrition: Tolerating Diet: Tolerating PT:      DRUG ALLERGIES:   Allergies  Allergen Reactions  . Contrast Media [Iodinated Diagnostic Agents]   . Morphine And Related Hives  . Other Rash    Blood Pressure Pill (Pt doesn't remember name)     VITALS:  Blood pressure 129/77, pulse 76, temperature 98.3 F (36.8 C), temperature source Oral, resp. rate 18, height 5\' 2"  (1.575 m), weight 85.6 kg (188 lb 11.4 oz), SpO2 99 %.  PHYSICAL EXAMINATION:   Physical Exam  GENERAL:  59 y.o.-year-old patient lying in the bed with no acute distress.  EYES: Pupils equal, round, reactive to light and accommodation. No scleral icterus.  Extraocular muscles intact.  HEENT: Head atraumatic, normocephalic. Oropharynx and nasopharynx clear.  NECK:  Supple, no jugular venous distention. No thyroid enlargement, no tenderness.  LUNGS: Bilateral coarse breath sounds present. t CARDIOVASCULAR: S1, S2 normal. No murmurs, rubs, or gallops.  ABDOMEN: Soft, nontender, nondistended. Bowel sounds present. No organomegaly or mass.  EXTREMITIES: No pedal edema, cyanosis, or clubbing.  NEUROLOGIC: Cranial nerves II through XII are intact. Muscle strength 5/5 in all extremities. Sensation intact. Gait not checked.  PSYCHIATRIC: The patient is alert and oriented x 3.  SKIN: No obvious rash, lesion, or ulcer.    LABORATORY PANEL:   CBC  Recent Labs Lab 09/02/14 0441  WBC 13.3*  HGB 8.3*  HCT 26.4*  PLT 240   ------------------------------------------------------------------------------------------------------------------  Chemistries   Recent Labs Lab 09/02/14 0441  NA 139  K 5.3*  CL 96*  CO2 33*  GLUCOSE 89  BUN 15  CREATININE 4.98*  CALCIUM 7.6*   ------------------------------------------------------------------------------------------------------------------  Cardiac Enzymes No results for input(s): TROPONINI in the last 168 hours. ------------------------------------------------------------------------------------------------------------------  RADIOLOGY:  Dg Chest 2 View  09/01/2014   CLINICAL DATA:  59 year old female with history of shortness of breath since yesterday.  EXAM: CHEST  2 VIEW  COMPARISON:  07/06/2014.  FINDINGS: Lung volumes are low. Opacity at the left base likely to reflect a chronic moderate left pleural effusion with associated atelectasis in the left lower lobe. There is cephalization of the pulmonary vasculature and slight indistinctness of the interstitial markings suggestive of mild pulmonary edema. Mild cardiomegaly. Upper mediastinal contours appear widened, favored to be related to  vascular congestion.  IMPRESSION: 1. Findings suggestive of congestive heart failure, as  above. 2. Moderate chronic left pleural effusion with associated passive subsegmental atelectasis in the left lower lobe. 3. Widening of the mediastinal contours, similar to some prior examinations, favored in this case to be related to vascular congestion.   Electronically Signed   By: Vinnie Langton M.D.   On: 09/01/2014 14:43   Ct Chest Wo Contrast  09/02/2014   CLINICAL DATA:  59 year old female with increasing shortness of breath for 2 days. End-stage renal disease, hypertension, diabetes. Initial encounter.  EXAM: CT CHEST WITHOUT CONTRAST  TECHNIQUE: Multidetector CT imaging of the chest was performed following the standard protocol without IV contrast.  COMPARISON:  Chest radiographs 09/01/2014 and earlier. CT Abdomen and Pelvis 01/18/2014. Chest CTA 09/24/2013.  FINDINGS: Right IJ approach dual lumen dialysis type catheter in place. Major airways are patent. Small to moderate left and small right layering pleural effusions. Cardiomegaly. No pericardial effusion.  Superimposed bilateral perihilar crowding of lung markings, and in the inferior left upper lobe, lingula, and much of the left lower lobe there is consolidation with air bronchograms (coronal image 92). Occasional patchy peribronchovascular opacity in the right lung (series 3, image 29).  No mediastinal or axillary lymphadenopathy. Mildly increased subcutaneous fat stranding throughout.  Cholelithiasis, with gallstones up to 26 mm diameter. Negative visualized noncontrast liver, spleen, pancreas, and bowel in the upper abdomen. Native renal atrophy and adrenal hyperplasia.  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 Benign vertebral body hemangioma at L1. At T9-T10 there is new moderate to severe endplate irregularity and disc space loss. There is associated endplate osteophytosis. There is no paraspinal phlegmon identified. This appearance  is new since December 2015. Otherwise stable osseous structures.  IMPRESSION: 1. Cardiomegaly with a degree of volume overload, including left greater than right layering pleural effusions. Multilobar consolidation on the left more suggestive of pneumonia than atelectasis. 2. Abnormal appearance of the T9-T10 disc and endplates is new since December 2015. No paraspinal inflammation to strongly suggest acute discitis osteomyelitis. But if there is thoracic back pain or bacteremia then recommend follow-up noncontrast Thoracic MRI. 3. Cholelithiasis.   Electronically Signed   By: Genevie Ann M.D.   On: 09/02/2014 10:27     ASSESSMENT AND PLAN:   Principal Problem:   Hyperkalemia Active Problems:   Fluid overload   Malignant hypertension   #1 Acute respiratory failure secondary to hyperkalemia pulmonary edema and a contest of missed hemodialysis. Patient received emergency hemodialysis on admission. Potassium improved.  #2 ESRD on hemodialysis Tuesday, Thursday, Saturday. 3. Acute respiratory failure secondary to pneumonia CT chest confirmed the multilobar pneumonia.started  IV antibiotics.\,continue oxygen, has some wheezing so started on nebulizers.  4.htn;controlled. Hypertensive emergency on admission, BP is volume driven. BP is better after dialysis. Continue her medicines Lasix, amlodipine, Imdur, metoprolol. The hyperparathyroidism   anemia of chronic kidney disease  history of MSSA bacteremia All the records are reviewed and case discussed with Care Management/Social Workerr. Management plans discussed with the patient, family and they are in agreement.  CODE STATUS: full  TOTAL TIME TAKING CARE OF THIS PATIENT: 35 minutes.   POSSIBLE D/C IN 1-2 DAYS, DEPENDING ON CLINICAL CONDITION.   [name] M.D on 09/03/2014 at 10:54 AM  Between 7am to 6pm - Pager - [phone]  After 6pm go to [email or web address] - password [email or web address]  [facility]  Office   [phone]  CC: Primary care physician; No primary care provider on file.

## 2014-09-03 NOTE — Progress Notes (Signed)
[facility]  ROUNDING NOTE   Subjective:  Pt resting in bed comfortably. States overall shortness of breath improved.   Objective:  Vital signs in last 24 hours:  Temp:  [97.6 F (36.4 C)-98.5 F (36.9 C)] 98.3 F (36.8 C) (08/15 0910) Pulse Rate:  [72-82] 72 (08/15 1222) Resp:  [18-20] 20 (08/15 1222) BP: (121-137)/(77-82) 121/80 mmHg (08/15 1222) SpO2:  [99 %-100 %] 100 % (08/15 1542)  Weight change:  Filed Weights   09/01/14 1940 09/01/14 2300 09/01/14 2327  Weight: 90.9 kg (200 lb 6.4 oz) 87.4 kg (192 lb 10.9 oz) 85.6 kg (188 lb 11.4 oz)    Intake/Output: I/O last 3 completed shifts: In: 3 [I.V.:3] Out: 3000 [Other:3000]   Intake/Output this shift:  Total I/O In: 6 [I.V.:6] Out: -   Physical Exam: General: NAD  Head: Normocephalic, atraumatic. Hard of hearing  Eyes: Anicteric, PERRL  Neck: Supple, trachea midline  Lungs:  Clear to auscultation normal effort  Heart: Regular rate and rhythm  Abdomen:  Soft, nontender, BS present  Extremities: No peripheral edema.  Neurologic: Nonfocal, moving all four extremities  Skin: No lesions  Access: Right IJ permcath    Basic Metabolic Panel:  Recent Labs Lab 09/01/14 1346 09/02/14 0441  NA 138 139  K 6.0* 5.3*  CL 96* 96*  CO2 31 33*  GLUCOSE 99 89  BUN 22* 15  CREATININE 6.36* 4.98*  CALCIUM 8.2* 7.6*    Liver Function Tests: No results for input(s): AST, ALT, ALKPHOS, BILITOT, PROT, ALBUMIN in the last 168 hours. No results for input(s): LIPASE, AMYLASE in the last 168 hours. No results for input(s): AMMONIA in the last 168 hours.  CBC:  Recent Labs Lab 09/01/14 1425 09/02/14 0441  WBC 9.8 13.3*  HGB 9.1* 8.3*  HCT 27.5* 26.4*  MCV 82.1 82.0  PLT 313 240    Cardiac Enzymes: No results for input(s): CKTOTAL, CKMB, CKMBINDEX, TROPONINI in the last 168 hours.  BNP: Invalid input(s): POCBNP  CBG:  Recent Labs Lab 09/01/14 2329  GLUCAP 43    Microbiology: Results for  orders placed or performed during the hospital encounter of 09/01/14  MRSA PCR Screening     Status: Abnormal   Collection Time: 09/03/14  1:46 PM  Result Value Ref Range Status   MRSA by PCR POSITIVE (A) NEGATIVE Final    Comment:        The GeneXpert MRSA Assay (FDA approved for NASAL specimens only), is one component of a comprehensive MRSA colonization surveillance program. It is not intended to diagnose MRSA infection nor to guide or monitor treatment for MRSA infections. CALLED TO Kelsey Peterson 09/03/2014 1515 [facility]     Coagulation Studies: No results for input(s): LABPROT, INR in the last 72 hours.  Urinalysis: No results for input(s): COLORURINE, LABSPEC, PHURINE, GLUCOSEU, HGBUR, BILIRUBINUR, KETONESUR, PROTEINUR, UROBILINOGEN, NITRITE, LEUKOCYTESUR in the last 72 hours.  Invalid input(s): APPERANCEUR    Imaging: Ct Chest Wo Contrast  09/02/2014   CLINICAL DATA:  59 year old female with increasing shortness of breath for 2 days. End-stage renal disease, hypertension, diabetes. Initial encounter.  EXAM: CT CHEST WITHOUT CONTRAST  TECHNIQUE: Multidetector CT imaging of the chest was performed following the standard protocol without IV contrast.  COMPARISON:  Chest radiographs 09/01/2014 and earlier. CT Abdomen and Pelvis 01/18/2014. Chest CTA 09/24/2013.  FINDINGS: Right IJ approach dual lumen dialysis type catheter in place. Major airways are patent. Small to moderate left and small right layering pleural effusions. Cardiomegaly. No  pericardial effusion.  Superimposed bilateral perihilar crowding of lung markings, and in the inferior left upper lobe, lingula, and much of the left lower lobe there is consolidation with air bronchograms (coronal image 92). Occasional patchy peribronchovascular opacity in the right lung (series 3, image 29).  No mediastinal or axillary lymphadenopathy. Mildly increased subcutaneous fat stranding throughout.  Cholelithiasis, with gallstones up to 26  mm diameter. Negative visualized noncontrast liver, spleen, pancreas, and bowel in the upper abdomen. Native renal atrophy and adrenal hyperplasia.  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 Benign vertebral body hemangioma at L1. At T9-T10 there is new moderate to severe endplate irregularity and disc space loss. There is associated endplate osteophytosis. There is no paraspinal phlegmon identified. This appearance is new since December 2015. Otherwise stable osseous structures.  IMPRESSION: 1. Cardiomegaly with a degree of volume overload, including left greater than right layering pleural effusions. Multilobar consolidation on the left more suggestive of pneumonia than atelectasis. 2. Abnormal appearance of the T9-T10 disc and endplates is new since December 2015. No paraspinal inflammation to strongly suggest acute discitis osteomyelitis. But if there is thoracic back pain or bacteremia then recommend follow-up noncontrast Thoracic MRI. 3. Cholelithiasis.   Electronically Signed   By: Genevie Ann M.D.   On: 09/02/2014 10:27     Medications:     . albuterol  2.5 mg Nebulization QID  . amiodarone  200 mg Oral BID  . amLODipine  5 mg Oral Daily  . antiseptic oral rinse  7 mL Mouth Rinse BID  . azithromycin  250 mg Oral Daily  . calcium acetate  667 mg Oral TID WC  . calcium carbonate  1 tablet Oral Daily  . cefTRIAXone (ROCEPHIN)  IV  1 g Intravenous Q24H  . ferrous sulfate  325 mg Oral Daily  . furosemide  80 mg Oral Daily  . heparin  5,000 Units Subcutaneous 3 times per day  . insulin aspart  0-5 Units Subcutaneous QHS  . insulin aspart  0-9 Units Subcutaneous TID WC  . isosorbide mononitrate  30 mg Oral Daily  . metoprolol tartrate  12.5 mg Oral BID  . multivitamin  1 tablet Oral QHS  . pantoprazole  40 mg Oral Daily  . sodium chloride  3 mL Intravenous Q12H   sodium chloride, acetaminophen **OR** acetaminophen, hydrALAZINE, lidocaine-prilocaine, ondansetron **OR**  ondansetron (ZOFRAN) IV, sodium chloride, traMADol  Assessment/ Plan:  [name] is a 59 y.o.black  female with hypertension, anemia of chronic kidney disease, ESRD on HD TTHS, secondary hyperparathyroidism, hx of meningitis as a child with resultant hearing loss, hx of lytic lesions in ileum followed by oncology, h/o MSSA bacteremia, colonoscopy 06/2014- polyps removed  TTS [facility].   1. End Stage Renal Disease on HD TTHS:  No urgent indication for HD at present.  Will plan for HD again tomorrow.  2. Hypertension: BP under better control now.  Continue amlodipine and metoprolol.    3. Anemia of chronic kidney disease: epogen was temporarily on hold, will consider restarting.   4. Secondary Hyperparathyroidism: check phos with next HD. Continue phoslo 1 tab po tid/wm.   LOS: 2 Kelsey Peterson 8/15/20165:34 PM

## 2014-09-03 NOTE — Progress Notes (Signed)
Patient in bed, no pain or distress.

## 2014-09-03 NOTE — Progress Notes (Signed)
Patient in bed eating lunch.  Refused mouthwash and insulin check--states she is not a diabetic.

## 2014-09-03 NOTE — Progress Notes (Signed)
Initial Nutrition Assessment    INTERVENTION:   Meals and Snacks: Cater to patient preferences Education: follow-up and review/reinforce diet   NUTRITION DIAGNOSIS:   Altered nutrition lab value related to chronic illness as evidenced by  (hyperkalemia).  GOAL:   Patient will meet greater than or equal to 90% of their needs   MONITOR:    (Energy Intake, Electrolyte/Renal profile, Gluose Profile, Digestive System, Anthropometrics)  REASON FOR ASSESSMENT:   Diagnosis (Renal Diet)    ASSESSMENT:    Pt admitted with acute respiratory failure due to pulmonary edema, hyperkalemia, missed HD  Past Medical History  Diagnosis Date  . COPD (chronic obstructive pulmonary disease)   . ESRD on hemodialysis   . DM2 (diabetes mellitus, type 2)   . Hepatitis C   . Migraine with aura   . HOH (hard of hearing)   . Mitral regurgitation     a. echo 06/2014: EF 60%, no WMA, GR1DD, mild AI, calcified mitral annulus, mod MR, no atrial septal defect or patent foramen ovale   . Cognitive impairment   . CAD (coronary artery disease)   . HTN (hypertension)    Past Surgical History  Procedure Laterality Date  . Dialysis fistula creation    . Tubal ligation    . Colonoscopy with propofol Left 07/09/2014    Procedure: COLONOSCOPY WITH PROPOFOL;  Surgeon: Hulen Luster, MD;  Location: [facility] ENDOSCOPY;  Service: Endoscopy;  Laterality: Left;  . Colonoscopy N/A 07/15/2014    Procedure: COLONOSCOPY;  Surgeon: Manya Silvas, MD;  Location: [facility] ENDOSCOPY;  Service: Endoscopy;  Laterality: N/A;     Diet Order:  Diet renal with fluid restriction Fluid restriction:: 1200 mL Fluid; Room service appropriate?: Yes; Fluid consistency:: Thin  Energy Intake: appetite good, no recorded po intake, reports she ate breakfast and lunch today  Electrolyte and Renal Profile:  Recent Labs Lab 09/01/14 1346 09/02/14 0441  BUN 22* 15  CREATININE 6.36* 4.98*  NA 138 139  K 6.0* 5.3*   Glucose Profile:   Recent Labs  09/01/14 2329  GLUCAP 72   Protein Profile: No results for input(s): ALBUMIN in the last 168 hours. Nutritional Anemia Profile:  CBC Latest Ref Rng 09/02/2014 09/01/2014 07/16/2014  WBC 3.6 - 11.0 K/uL 13.3(H) 9.8 -  Hemoglobin 12.0 - 16.0 g/dL 8.3(L) 9.1(L) 8.6(L)  Hematocrit 35.0 - 47.0 % 26.4(L) 27.5(L) -  Platelets 150 - 440 K/uL 240 313 -   Meds: lasix, ferrous sulfate, ss novolog, phoslo, MVI  Skin:  Reviewed, no issues  Last BM:  8/13  Height:   Ht Readings from Last 1 Encounters:  09/01/14 5\' 2"  (1.575 m)    Weight:   Wt Readings from Last 1 Encounters:  09/01/14 188 lb 11.4 oz (85.6 kg)   Filed Weights   09/01/14 1940 09/01/14 2300 09/01/14 2327  Weight: 200 lb 6.4 oz (90.9 kg) 192 lb 10.9 oz (87.4 kg) 188 lb 11.4 oz (85.6 kg)    BMI:  Body mass index is 34.51 kg/(m^2).  LOW Care Level  Kerman Passey MS, [place], LDN [phone] Pager

## 2014-09-03 NOTE — Progress Notes (Signed)
Pt requested medication for insomnia, orders received.

## 2014-09-04 LAB — CBC
HCT: 22.8 % — ABNORMAL LOW (ref 35.0–47.0)
HEMOGLOBIN: 7.5 g/dL — AB (ref 12.0–16.0)
MCH: 26.6 pg (ref 26.0–34.0)
MCHC: 32.9 g/dL (ref 32.0–36.0)
MCV: 81 fL (ref 80.0–100.0)
PLATELETS: 185 10*3/uL (ref 150–440)
RBC: 2.82 MIL/uL — AB (ref 3.80–5.20)
RDW: 20.2 % — ABNORMAL HIGH (ref 11.5–14.5)
WBC: 6 10*3/uL (ref 3.6–11.0)

## 2014-09-04 MED ORDER — EPOETIN ALFA 10000 UNIT/ML IJ SOLN
10000.0000 [IU] | INTRAMUSCULAR | Status: DC
  Administered 2014-09-04: 10000 [IU] via INTRAVENOUS

## 2014-09-04 NOTE — Care Management Note (Signed)
Patient is active at [facility] on TTS schedule.  I have notified clinic of admission and will send updated medical records to them at discharge. Iran Sizer  Dialysis Liaison  [phone]

## 2014-09-04 NOTE — Progress Notes (Signed)
PRE HD   09/04/14 0930  Neurological  Level of Consciousness Alert  Orientation Level Oriented to person  Respiratory  Respiratory Pattern Dyspnea with exertion  Chest Assessment Chest expansion symmetrical  Bilateral Breath Sounds Expiratory wheezes  Cardiac  Pulse Regular  ECG Monitor Yes  Cardiac Rhythm NSR;Heart block  Antiarrhythmic device No  Vascular  Edema Left lower extremity;Right lower extremity  Generalized Edema Other (Comment) (NON PITTING)  RLE Edema +1  LLE Edema +1  Integumentary  Integumentary (WDL) X  Skin Color Appropriate for ethnicity  Skin Condition Flaky  Skin Integrity Intact  Musculoskeletal  Musculoskeletal (WDL) WDL  Gastrointestinal  Bowel Sounds Assessment Active  GU Assessment  Genitourinary (WDL) X  Genitourinary Symptoms Other (Comment) (HD)  Psychosocial  Psychosocial (WDL) WDL  Patient Behaviors Agitated;Withdrawn  Emotional support given Given to patient

## 2014-09-04 NOTE — Progress Notes (Signed)
PT Cancellation Note  Patient Details Name: Kelsey Peterson MRN: [id number] DOB: 08-Feb-1955   Cancelled Treatment:     Patient is currently off the floor for hemodialysis. Patient will continue to be followed by PT and will be seen for mobility evaluation as appropriate and available.    Kerman Passey, PT, DPT    09/04/2014, 10:26 AM

## 2014-09-04 NOTE — Progress Notes (Signed)
Pt refused bedtime CBG check. Educated. Will monitor.

## 2014-09-04 NOTE — Progress Notes (Signed)
[facility] [place] NAME: Kelsey Peterson    MR#:  [id number]  DATE OF BIRTH:  [birth year]  SUBJECTIVE: Seen today, patient resting comfortably, no chest pain, no shortness of breath. Audible wheeze is present but she denies any complaints.   CHIEF COMPLAINT:   Chief Complaint  Patient presents with  . Respiratory Distress    REVIEW OF SYSTEMS:    Review of Systems  Constitutional: Negative for fever, chills, weight loss, malaise/fatigue and diaphoresis.  HENT: Negative for hearing loss.   Eyes: Negative for blurred vision, double vision and photophobia.  Respiratory: Positive for shortness of breath and wheezing. Negative for cough, hemoptysis and sputum production.   Cardiovascular: Negative for chest pain, palpitations, orthopnea and leg swelling.  Gastrointestinal: Negative for heartburn, nausea, vomiting, abdominal pain and diarrhea.  Genitourinary: Negative for dysuria and urgency.  Musculoskeletal: Negative for myalgias and neck pain.  Skin: Negative for rash.  Neurological: Negative for dizziness, focal weakness, seizures, weakness and headaches.  Endo/Heme/Allergies: Does not bruise/bleed easily.  Psychiatric/Behavioral: Negative for memory loss. The patient does not have insomnia.     Nutrition: Tolerating Diet: Tolerating PT:      DRUG ALLERGIES:   Allergies  Allergen Reactions  . Contrast Media [Iodinated Diagnostic Agents]   . Morphine And Related Hives  . Other Rash    Blood Pressure Pill (Pt doesn't remember name)     VITALS:  Blood pressure 135/78, pulse 73, temperature 98.3 F (36.8 C), temperature source Oral, resp. rate 20, height 5\' 2"  (1.575 m), weight 87.635 kg (193 lb 3.2 oz), SpO2 97 %.  PHYSICAL EXAMINATION:   Physical Exam  GENERAL:  59 y.o.-year-old patient lying in the bed with no acute distress.  EYES: Pupils equal, round, reactive to light and accommodation. No scleral icterus.  Extraocular muscles intact.  HEENT: Head atraumatic, normocephalic. Oropharynx and nasopharynx clear.  NECK:  Supple, no jugular venous distention. No thyroid enlargement, no tenderness.  LUNGS: Bilateral coarse breath sounds present. t CARDIOVASCULAR: S1, S2 normal. No murmurs, rubs, or gallops.  ABDOMEN: Soft, nontender, nondistended. Bowel sounds present. No organomegaly or mass.  EXTREMITIES: No pedal edema, cyanosis, or clubbing.  NEUROLOGIC: Cranial nerves II through XII are intact. Muscle strength 5/5 in all extremities. Sensation intact. Gait not checked.  PSYCHIATRIC: The patient is alert and oriented x 3.  SKIN: No obvious rash, lesion, or ulcer.    LABORATORY PANEL:   CBC  Recent Labs Lab 09/04/14 0431  WBC 6.0  HGB 7.5*  HCT 22.8*  PLT 185   ------------------------------------------------------------------------------------------------------------------  Chemistries   Recent Labs Lab 09/02/14 0441  NA 139  K 5.3*  CL 96*  CO2 33*  GLUCOSE 89  BUN 15  CREATININE 4.98*  CALCIUM 7.6*   ------------------------------------------------------------------------------------------------------------------  Cardiac Enzymes No results for input(s): TROPONINI in the last 168 hours. ------------------------------------------------------------------------------------------------------------------  RADIOLOGY:  No results found.   ASSESSMENT AND PLAN:   Principal Problem:   Hyperkalemia Active Problems:   Fluid overload   Malignant hypertension   #1 Acute respiratory failure secondary to hyperkalemia pulmonary edema and a contest of missed hemodialysis. Patient received emergency hemodialysis on admission. Potassium improved.  #2 ESRD on hemodialysis Tuesday, Thursday, Saturday., Hemodialysis today. Has anemia of chronic kidney disease, hemoglobin is 7.5 today. Monitor the trend. Hold off on the transfusion.. 3. Acute respiratory failure secondary to  pneumonia CT chest confirmed the multilobar pneumonia.started  IV antibiotics.\,continue oxygen, has some wheezing so started on  nebulizers. WBC improved. Most likely discharged tomorrow.  4.htn;controlled. Hypertensive emergency on admission, BP is volume driven. BP is better after dialysis. Continue her medicines Lasix, amlodipine, Imdur, metoprolol. The hyperparathyroidism   anemia of chronic kidney disease  history of MSSA bacteremia All the records are reviewed and case discussed with Care Management/Social Workerr. Management plans discussed with the patient, family and they are in agreement.  CODE STATUS: full  TOTAL TIME TAKING CARE OF THIS PATIENT: 35 minutes.   POSSIBLE D/C IN 1-2 DAYS, DEPENDING ON CLINICAL CONDITION.   [name] M.D on 09/04/2014 at 9:03 AM  Between 7am to 6pm - Pager - [phone]  After 6pm go to [email or web address] - password [email or web address] [facility]  [facility]  Office  [phone]  CC: Primary care physician; No primary care provider on file.

## 2014-09-04 NOTE — Progress Notes (Signed)
[facility]  ROUNDING NOTE   Subjective:  Pt seen during HD. Tolerating well.   BFR 400.   Objective:  Vital signs in last 24 hours:  Temp:  [97.4 F (36.3 C)-98.3 F (36.8 C)] 97.6 F (36.4 C) (08/16 0945) Pulse Rate:  [72-80] 77 (08/16 1130) Resp:  [18-24] 24 (08/16 1130) BP: (121-148)/(78-95) 146/82 mmHg (08/16 1130) SpO2:  [97 %-100 %] 97 % (08/16 0743) Weight:  [87.635 kg (193 lb 3.2 oz)-91.8 kg (202 lb 6.1 oz)] 91.8 kg (202 lb 6.1 oz) (08/16 0945)  Weight change:  Filed Weights   09/01/14 2327 09/04/14 0517 09/04/14 0945  Weight: 85.6 kg (188 lb 11.4 oz) 87.635 kg (193 lb 3.2 oz) 91.8 kg (202 lb 6.1 oz)    Intake/Output: I/O last 3 completed shifts: In: 9 [I.V.:9] Out: 0    Intake/Output this shift:     Physical Exam: General: NAD  Head: Normocephalic, atraumatic. Hard of hearing  Eyes: Anicteric  Neck: Supple, trachea midline  Lungs:  Clear to auscultation normal effort  Heart: Regular rate and rhythm  Abdomen:  Soft, nontender, BS present  Extremities: No peripheral edema.  Neurologic: Nonfocal, moving all four extremities  Skin: No lesions  Access: Right IJ permcath    Basic Metabolic Panel:  Recent Labs Lab 09/01/14 1346 09/02/14 0441  NA 138 139  K 6.0* 5.3*  CL 96* 96*  CO2 31 33*  GLUCOSE 99 89  BUN 22* 15  CREATININE 6.36* 4.98*  CALCIUM 8.2* 7.6*    Liver Function Tests: No results for input(s): AST, ALT, ALKPHOS, BILITOT, PROT, ALBUMIN in the last 168 hours. No results for input(s): LIPASE, AMYLASE in the last 168 hours. No results for input(s): AMMONIA in the last 168 hours.  CBC:  Recent Labs Lab 09/01/14 1425 09/02/14 0441 09/04/14 0431  WBC 9.8 13.3* 6.0  HGB 9.1* 8.3* 7.5*  HCT 27.5* 26.4* 22.8*  MCV 82.1 82.0 81.0  PLT 313 240 185    Cardiac Enzymes: No results for input(s): CKTOTAL, CKMB, CKMBINDEX, TROPONINI in the last 168 hours.  BNP: Invalid input(s): POCBNP  CBG:  Recent Labs Lab  09/01/14 2329  GLUCAP 76    Microbiology: Results for orders placed or performed during the hospital encounter of 09/01/14  MRSA PCR Screening     Status: Abnormal   Collection Time: 09/03/14  1:46 PM  Result Value Ref Range Status   MRSA by PCR POSITIVE (A) NEGATIVE Final    Comment:        The GeneXpert MRSA Assay (FDA approved for NASAL specimens only), is one component of a comprehensive MRSA colonization surveillance program. It is not intended to diagnose MRSA infection nor to guide or monitor treatment for MRSA infections. CALLED TO TONYA TROUTMAN 09/03/2014 1515 [facility]     Coagulation Studies: No results for input(s): LABPROT, INR in the last 72 hours.  Urinalysis: No results for input(s): COLORURINE, LABSPEC, PHURINE, GLUCOSEU, HGBUR, BILIRUBINUR, KETONESUR, PROTEINUR, UROBILINOGEN, NITRITE, LEUKOCYTESUR in the last 72 hours.  Invalid input(s): APPERANCEUR    Imaging: No results found.   Medications:     . albuterol  2.5 mg Nebulization QID  . amiodarone  200 mg Oral BID  . amLODipine  5 mg Oral Daily  . antiseptic oral rinse  7 mL Mouth Rinse BID  . azithromycin  250 mg Oral Daily  . calcium acetate  667 mg Oral TID WC  . calcium carbonate  1 tablet Oral Daily  . cefTRIAXone (ROCEPHIN)  IV  1 g Intravenous Q24H  . ferrous sulfate  325 mg Oral Daily  . furosemide  80 mg Oral Daily  . heparin  5,000 Units Subcutaneous 3 times per day  . insulin aspart  0-5 Units Subcutaneous QHS  . insulin aspart  0-9 Units Subcutaneous TID WC  . isosorbide mononitrate  30 mg Oral Daily  . metoprolol tartrate  12.5 mg Oral BID  . multivitamin  1 tablet Oral QHS  . pantoprazole  40 mg Oral Daily  . sodium chloride  3 mL Intravenous Q12H   sodium chloride, acetaminophen **OR** acetaminophen, hydrALAZINE, lidocaine-prilocaine, ondansetron **OR** ondansetron (ZOFRAN) IV, sodium chloride, traMADol, zolpidem  Assessment/ Plan:  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   1. End Stage Renal Disease on HD TTHS:  Pt seen during HD, UF target 2-2.5kg today.  Conitnue HD on TTHS schedule.  2. Hypertension: BP 146/82, continue amlodipine and metoprolol.    3. Anemia of chronic kidney disease: hgb 7.5, will resume epogen.   4. Secondary Hyperparathyroidism: awaiting phos, continue phoslo.    LOS: 3 Kelsey Peterson 8/16/201611:39 AM

## 2014-09-04 NOTE — Progress Notes (Signed)
POST HD   09/04/14 1315  Neurological  Level of Consciousness Alert  Orientation Level Oriented X4  Respiratory  Respiratory Pattern Dyspnea with exertion  Chest Assessment Chest expansion symmetrical  Bilateral Breath Sounds Expiratory wheezes  Cardiac  Pulse Regular  ECG Monitor Yes  Cardiac Rhythm NSR;Heart block  Vascular  Edema Left lower extremity;Right lower extremity  RLE Edema Non-pitting  LLE Edema Non-Pitting  Integumentary  Integumentary (WDL) X  Skin Color Appropriate for ethnicity  Skin Condition Flaky  Skin Integrity Intact  Musculoskeletal  Musculoskeletal (WDL) WDL  Gastrointestinal  Bowel Sounds Assessment Active  GU Assessment  Genitourinary (WDL) X  Genitourinary Symptoms Other (Comment) (HD)  Psychosocial  Psychosocial (WDL) WDL  Patient Behaviors Cooperative;Agitated  Emotional support given Given to patient

## 2014-09-04 NOTE — Progress Notes (Signed)
PRE HD   

## 2014-09-04 NOTE — Progress Notes (Signed)
POST HD   09/04/14 1310  Report  Report Received From [facility], RN  Vital Signs  Temp 97.5 F (36.4 C)  Temp Source Oral  Pulse Rate Source Monitor  BP Location Left Arm  BP Method Automatic  Patient Position (if appropriate) Lying  Dialysis Weight  Weight 88.9 kg (195 lb 15.8 oz)  Type of Weight Post-Dialysis  During Hemodialysis Assessment  Intra-Hemodialysis Comments HD TREATMENT END.  GOAL MET. BLOOD RETURNED AND CVC ACCESSED PER PROTOCOL.  PT ALERT AND VS STABLE.   Post-Hemodialysis Assessment  Rinseback Volume (mL) 250 mL  Dialyzer Clearance Lightly streaked  Duration of HD Treatment -hour(s) 3 hour(s)  Hemodialysis Intake (mL) 500 mL  UF Total -Machine (mL) 3000 mL  Net UF (mL) 2500 mL  Tolerated HD Treatment Yes  Post-Hemodialysis Comments HD TREATMENT END.  GOAL MET. BLOOD RETURNED AND CVC ACCESSED PER PROTOCOL.  PT ALERT AND VS STABLE.   Education / Care Plan  Hemodialysis Education Provided Yes  Documented Education in Clinical Pathway Yes  Fistula / Graft Right Upper arm Arteriovenous fistula  No Placement Date or Time found.   Placed prior to admission: Yes  Orientation: Right  Access Location: Upper arm  Access Type: Arteriovenous fistula  Site Condition No complications  Fistula / Graft Assessment Present;Thrill;Bruit  Hemodialysis Catheter Right Subclavian Double-lumen  No Placement Date or Time found.   Placed prior to admission: Yes  Orientation: Right  Access Location: Subclavian  Hemodialysis Catheter Type: Double-lumen  Site Condition No complications  Blue Lumen Status Heparin locked  Red Lumen Status Heparin locked  Purple Lumen Status N/A  Catheter fill solution Heparin 1000 units/ml  Catheter fill volume (Arterial) 2 cc  Catheter fill volume (Venous) 2.2  Dressing Type Gauze/Drain sponge  Dressing Status Clean;Dry;Intact  Drainage Description None  Dressing Change Due 09/06/14  Post treatment catheter status Capped and Clamped

## 2014-09-04 NOTE — Progress Notes (Signed)
POST HD 

## 2014-09-05 NOTE — Clinical Documentation Improvement (Signed)
Internal Medicine  Can the diagnosed of Pulmonary edema/fluid overload be further specified?  CHF    Acuity - Acute, Chronic, Acute on Chronic   Type - Systolic, Diastolic, Systolic and Diastolic   Pulmonary edema-Acuity  Other  Clinically Undetermined   Document any associated diagnoses/conditions   Supporting Information:  Risk Factors Respiratory failure w/Shortness of Breath 2 days prior to admission History of DM 2, hypertension, COPD and CAD Per H&P: RESPIRATORY: Has cough, shortness of breath, no wheezing or hemoptys EXTREMITIES: Bilateral lower extremity edema 2+, no cyanosis, or clubbing.   BNP=>4500  8/13 CXR: IMPRESSION: 1. Findings suggestive of congestive heart failure, as above. 2. Moderate chronic left pleural effusion with associated passive subsegmental atelectasis in the left lower lobe. 3. Widening of the mediastinal contours, similar to some prior examinations, favored in this case to be related to vascular Congestion.  Treatment Lasix 80mg  daily O2 therapy Saline locked IV  Please exercise your independent, professional judgment when responding. A specific answer is not anticipated or expected.   Thank You,  [place]

## 2014-09-05 NOTE — Progress Notes (Signed)
Unable to collect blood sugar result. Patient refusing to get stuck, unable to administer insulin due to no glucose results [employer]

## 2014-09-05 NOTE — Care Management (Signed)
Patient admitted with hyperkalemia after a miss HD session.  Patient states that she lives at home in an apartment.  Her son lives near by and is a resource for her.  Patient has a chronic need for O2 at home, which is provided by [place].  Patient is open to [facility] for home nursing.  Sharmon Revere from [facility] notified of patients admission.  Patient states that she has a walker at home to assist with ambulation.  RNCM to follow for DC planning

## 2014-09-05 NOTE — Progress Notes (Signed)
[facility] [place] NAME: Kelsey Peterson    MR#:  [id number]  DATE OF BIRTH:  1955/03/20  SUBJECTIVE: Seen today, patient denies any complaints and wants to go home but did not get up since admission so we are going to request physical therapy consult she says she lives by herself and uses walker at home. Patient denies any chest pain, shortness of breath.   CHIEF COMPLAINT:   Chief Complaint  Patient presents with  . Respiratory Distress    REVIEW OF SYSTEMS:    Review of Systems  Constitutional: Negative for fever, chills, weight loss, malaise/fatigue and diaphoresis.  HENT: Negative for hearing loss.   Eyes: Negative for blurred vision, double vision and photophobia.  Respiratory: Negative for cough, hemoptysis, sputum production, shortness of breath and wheezing.   Cardiovascular: Negative for chest pain, palpitations, orthopnea and leg swelling.  Gastrointestinal: Negative for heartburn, nausea, vomiting, abdominal pain and diarrhea.  Genitourinary: Negative for dysuria and urgency.  Musculoskeletal: Negative for myalgias and neck pain.  Skin: Negative for rash.  Neurological: Negative for dizziness, focal weakness, seizures, weakness and headaches.  Endo/Heme/Allergies: Does not bruise/bleed easily.  Psychiatric/Behavioral: Negative for memory loss. The patient does not have insomnia.     Nutrition: Tolerating Diet: Tolerating PT:      DRUG ALLERGIES:   Allergies  Allergen Reactions  . Contrast Media [Iodinated Diagnostic Agents]   . Morphine And Related Hives  . Other Rash    Blood Pressure Pill (Pt doesn't remember name)     VITALS:  Blood pressure 130/71, pulse 72, temperature 98.2 F (36.8 C), temperature source Oral, resp. rate 18, height 5\' 2"  (1.575 m), weight 88.9 kg (195 lb 15.8 oz), SpO2 93 %.  PHYSICAL EXAMINATION:   Physical Exam  GENERAL:  59 y.o.-year-old patient lying in the bed with no  acute distress.  EYES: Pupils equal, round, reactive to light and accommodation. No scleral icterus. Extraocular muscles intact.  HEENT: Head atraumatic, normocephalic. Oropharynx and nasopharynx clear.  NECK:  Supple, no jugular venous distention. No thyroid enlargement, no tenderness.  LUNGS: Mild expiratory wheeze in all lung fields. t CARDIOVASCULAR: S1, S2 normal. No murmurs, rubs, or gallops.  ABDOMEN: Soft, nontender, nondistended. Bowel sounds present. No organomegaly or mass.  EXTREMITIES: No pedal edema, cyanosis, or clubbing.  NEUROLOGIC: Cranial nerves II through XII are intact. Muscle strength 5/5 in all extremities. Sensation intact. Gait not checked.  PSYCHIATRIC: The patient is alert and oriented x 3.  SKIN: No obvious rash, lesion, or ulcer.    LABORATORY PANEL:   CBC  Recent Labs Lab 09/04/14 0431  WBC 6.0  HGB 7.5*  HCT 22.8*  PLT 185   ------------------------------------------------------------------------------------------------------------------  Chemistries   Recent Labs Lab 09/02/14 0441  NA 139  K 5.3*  CL 96*  CO2 33*  GLUCOSE 89  BUN 15  CREATININE 4.98*  CALCIUM 7.6*   ------------------------------------------------------------------------------------------------------------------  Cardiac Enzymes No results for input(s): TROPONINI in the last 168 hours. ------------------------------------------------------------------------------------------------------------------  RADIOLOGY:  No results found.   ASSESSMENT AND PLAN:   Principal Problem:   Hyperkalemia Active Problems:   Fluid overload   Malignant hypertension    Acute respiratory failure secondary to hyperkalemia pulmonary edema and a contest of missed hemodialysis. Patient received emergency hemodialysis on admission. Potassium improved.  #2 ESRD on hemodialysis Tuesday, Thursday, Saturday., Has anemia of chronic kidney disease, hemoglobin is 7.5 today. Monitor the  trend. Hold off on the transfusion.Marland Kitchen 3.  Acute respiratory failure secondary to pneumonia CT chest confirmed the multilobar pneumonia.started  IV antibiotics.\,continue oxygen, has some wheezing so started on nebulizers. WBC improved. Patient has chronic respiratory failure on oxygen 2-3 L at home. She is advised to continue that. 4.htn;controlled. Hypertensive emergency on admission, BP is volume driven. BP is better after dialysis. Continue her medicines Lasix, amlodipine, Imdur, metoprolol. The hyperparathyroidism   anemia of chronic kidney disease  history of MSSA bacteremia  Deconditioning: Physical therapy evaluation today, possible discharge plan depending on physical therapy evaluation.  All the records are reviewed and case discussed with Care Management/Social Workerr. Management plans discussed with the patient, family and they are in agreement.  CODE STATUS: full  TOTAL TIME TAKING CARE OF THIS PATIENT: 35 minutes.   POSSIBLE D/C IN 1-2 DAYS, DEPENDING ON CLINICAL CONDITION.   [name] M.D on 09/05/2014 at 9:58 AM  Between 7am to 6pm - Pager - [phone]  After 6pm go to [email or web address] - password [email or web address] [facility]  [facility]  Office  [phone]  CC: Primary care physician; No primary care provider on file.

## 2014-09-05 NOTE — Progress Notes (Signed)
[facility]  ROUNDING NOTE   Subjective:  Pt seen at bedside.   Resting comfortably. Still on nasal canula.  Had HD yesterday.   Objective:  Vital signs in last 24 hours:  Temp:  [97.5 F (36.4 C)-98.2 F (36.8 C)] 98.2 F (36.8 C) (08/17 0348) Pulse Rate:  [72-77] 72 (08/17 0348) Resp:  [18-25] 18 (08/17 0348) BP: (130-148)/(71-87) 130/71 mmHg (08/17 0348) SpO2:  [93 %-100 %] 93 % (08/17 0348) Weight:  [88.9 kg (195 lb 15.8 oz)] 88.9 kg (195 lb 15.8 oz) (08/16 1310)  Weight change: 4.165 kg (9 lb 2.9 oz) Filed Weights   09/04/14 0517 09/04/14 0945 09/04/14 1310  Weight: 87.635 kg (193 lb 3.2 oz) 91.8 kg (202 lb 6.1 oz) 88.9 kg (195 lb 15.8 oz)    Intake/Output: I/O last 3 completed shifts: In: 65 [IV Piggyback:50] Out: 2500 [Other:2500]   Intake/Output this shift:     Physical Exam: General: NAD  Head: Normocephalic, atraumatic. Hard of hearing  Eyes: Anicteric  Neck: Supple, trachea midline  Lungs:  Clear to auscultation normal effort  Heart: Regular rate and rhythm  Abdomen:  Soft, nontender, BS present  Extremities: No peripheral edema.  Neurologic: Nonfocal, moving all four extremities  Skin: No lesions  Access: Right IJ permcath    Basic Metabolic Panel:  Recent Labs Lab 09/01/14 1346 09/02/14 0441  NA 138 139  K 6.0* 5.3*  CL 96* 96*  CO2 31 33*  GLUCOSE 99 89  BUN 22* 15  CREATININE 6.36* 4.98*  CALCIUM 8.2* 7.6*    Liver Function Tests: No results for input(s): AST, ALT, ALKPHOS, BILITOT, PROT, ALBUMIN in the last 168 hours. No results for input(s): LIPASE, AMYLASE in the last 168 hours. No results for input(s): AMMONIA in the last 168 hours.  CBC:  Recent Labs Lab 09/01/14 1425 09/02/14 0441 09/04/14 0431  WBC 9.8 13.3* 6.0  HGB 9.1* 8.3* 7.5*  HCT 27.5* 26.4* 22.8*  MCV 82.1 82.0 81.0  PLT 313 240 185    Cardiac Enzymes: No results for input(s): CKTOTAL, CKMB, CKMBINDEX, TROPONINI in the last 168  hours.  BNP: Invalid input(s): POCBNP  CBG:  Recent Labs Lab 09/01/14 2329  GLUCAP 69    Microbiology: Results for orders placed or performed during the hospital encounter of 09/01/14  MRSA PCR Screening     Status: Abnormal   Collection Time: 09/03/14  1:46 PM  Result Value Ref Range Status   MRSA by PCR POSITIVE (A) NEGATIVE Final    Comment:        The GeneXpert MRSA Assay (FDA approved for NASAL specimens only), is one component of a comprehensive MRSA colonization surveillance program. It is not intended to diagnose MRSA infection nor to guide or monitor treatment for MRSA infections. CALLED TO TONYA TROUTMAN 09/03/2014 1515 [facility]     Coagulation Studies: No results for input(s): LABPROT, INR in the last 72 hours.  Urinalysis: No results for input(s): COLORURINE, LABSPEC, PHURINE, GLUCOSEU, HGBUR, BILIRUBINUR, KETONESUR, PROTEINUR, UROBILINOGEN, NITRITE, LEUKOCYTESUR in the last 72 hours.  Invalid input(s): APPERANCEUR    Imaging: No results found.   Medications:     . albuterol  2.5 mg Nebulization QID  . amiodarone  200 mg Oral BID  . amLODipine  5 mg Oral Daily  . antiseptic oral rinse  7 mL Mouth Rinse BID  . azithromycin  250 mg Oral Daily  . calcium acetate  667 mg Oral TID WC  . calcium carbonate  1  tablet Oral Daily  . cefTRIAXone (ROCEPHIN)  IV  1 g Intravenous Q24H  . epoetin (EPOGEN/PROCRIT) injection  10,000 Units Intravenous Q T,Th,Sa-HD  . ferrous sulfate  325 mg Oral Daily  . furosemide  80 mg Oral Daily  . heparin  5,000 Units Subcutaneous 3 times per day  . insulin aspart  0-5 Units Subcutaneous QHS  . insulin aspart  0-9 Units Subcutaneous TID WC  . isosorbide mononitrate  30 mg Oral Daily  . metoprolol tartrate  12.5 mg Oral BID  . multivitamin  1 tablet Oral QHS  . pantoprazole  40 mg Oral Daily  . sodium chloride  3 mL Intravenous Q12H   sodium chloride, acetaminophen **OR** acetaminophen, hydrALAZINE, lidocaine-prilocaine,  ondansetron **OR** ondansetron (ZOFRAN) IV, sodium chloride, traMADol, zolpidem  Assessment/ Plan:  Ms. 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   1. End Stage Renal Disease on HD TTHS:  Pt had HD yesterday, no acute indication for HD today, will plan for HD again tomorrow if still here.   2. Hypertension: BP 130/71, continue amlodipine and metoprolol.    3. Anemia of chronic kidney disease: hgb 7.5 yesterday, epogen restarted at 10000 units iV with HD, will need this as outpt as well.  4. Secondary Hyperparathyroidism: no new phos drawn, will need to continue to monitor bone mineral metabolism as outpt, continue phoslo 1 tab po tid/wm.    LOS: 4 Anissia Wessells 8/17/201611:36 AM

## 2014-09-05 NOTE — Progress Notes (Signed)
Physical Therapy Evaluation Patient Details Name: Kelsey Peterson MRN: [id number] DOB: August 10, 1955 Today's Date: 09/05/2014   History of Present Illness  Pt is 59 yo female admitted to the hospital with SOB and increased edema in bilateral LEs.   Clinical Impression  Pt presents with hx of COPD, ESRD, CAD, and HTN. Examination reveals that pt transfers at modified independence and ambulation of 30 ft on room air at [facility]. Pt did not wish to walk outside of room, but her general mobility looks [facility] Pt states the distances we walked were about household distances that she walks at home. Reference O2 sats in ambulation section. Pt does have cardiopulmonary impairments that will benefit from skilled PT in order for her to safely return and navigate her home environment.     Follow Up Recommendations Outpatient PT    Equipment Recommendations  None recommended by PT    Recommendations for Other Services       Precautions / Restrictions Precautions Precautions: Fall Restrictions Weight Bearing Restrictions: No      Mobility  Bed Mobility Overal bed mobility:  (Pt in recliner. Not able to assess )                Transfers Overall transfer level: Modified independent Equipment used: Rolling walker (2 wheeled)             General transfer comment: Pt shows good functional strength with transfers. No LOB or unsteadiness   Ambulation/Gait Ambulation/Gait assistance: Min guard Ambulation Distance (Feet): 30 Feet Assistive device: Rolling walker (2 wheeled) Gait Pattern/deviations: WFL(Within Functional Limits) Gait velocity: slightly decreased    General Gait Details: Pt with slightly labored breathing with ambulation, but gait is WFL. O2 sats at rest on room air were 97% and immediately after ambulating on room air O2 sats were 95%  Stairs            Wheelchair Mobility    Modified Rankin (Stroke Patients Only)       Balance Overall balance assessment: No  apparent balance deficits (not formally assessed)                                           Pertinent Vitals/Pain Pain Assessment: No/denies pain    Home Living Family/patient expects to be discharged to:: Private residence Living Arrangements: Alone Available Help at Discharge: Friend(s) Type of Home: Apartment Home Access: Level entry       Home Equipment: Walker - 2 wheels;Cane - single point Additional Comments: Pt is independent with ADLs at home    Prior Function Level of Independence: Independent with assistive device(s)         Comments: Pt uses RW, ambulates mostly household distances. States that she uses O2 at night.     Hand Dominance        Extremity/Trunk Assessment   Upper Extremity Assessment: Overall WFL for tasks assessed           Lower Extremity Assessment: Overall WFL for tasks assessed         Communication   Communication:  (Possible hearing problems. Needed therapist to write comms.)  Cognition Arousal/Alertness: Awake/alert Behavior During Therapy: [facility] for tasks assessed/performed Overall Cognitive Status: Difficult to assess                      General Comments      Exercises  Assessment/Plan    PT Assessment Patient needs continued PT services  PT Diagnosis  (cardiopulmonary impairments )   PT Problem List Cardiopulmonary status limiting activity  PT Treatment Interventions DME instruction;Gait training;Stair training;Functional mobility training;Therapeutic activities;Therapeutic exercise;Balance training;Cognitive remediation;Neuromuscular re-education;Patient/family education;Manual techniques   PT Goals (Current goals can be found in the Care Plan section) Acute Rehab PT Goals Patient Stated Goal: to go home PT Goal Formulation: With patient Time For Goal Achievement: 09/19/14 Potential to Achieve Goals: Good    Frequency Min 2X/week   Barriers to discharge         Co-evaluation               End of Session Equipment Utilized During Treatment: Gait belt Activity Tolerance: Patient tolerated treatment well Patient left: in chair;with call bell/phone within reach (Pt refusing bed or chair alarm. Nurses aware. ) Nurse Communication: Mobility status         Time: 9811-9147 PT Time Calculation (min) (ACUTE ONLY): 24 min   Charges:         PT G CodesJanyth Contes 09-Sep-2014, 1:03 PM Janyth Contes, SPT. [phone]

## 2014-09-05 NOTE — Care Management (Signed)
Discharge planning.  Spoke with [facility] dialysis liaison.  If patient is medically cleared to be discharge on 09/06/14 then patient can attend her scheduled outpatient dialysis.  In order to accommodate this the patient would need to be discharged by 1100 am.  Dr Vianne Bulls notified.

## 2014-09-06 MED ORDER — ALBUTEROL SULFATE HFA 108 (90 BASE) MCG/ACT IN AERS: 2.0000 | INHALATION_SPRAY | Freq: Four times a day (QID) | RESPIRATORY_TRACT | Status: DC | PRN

## 2014-09-06 MED ORDER — PREDNISONE 10 MG PO TABS: ORAL_TABLET | ORAL | Status: DC

## 2014-09-06 MED ORDER — PREDNISONE 50 MG PO TABS
50.0000 mg | ORAL_TABLET | Freq: Once | ORAL | Status: AC
  Administered 2014-09-06: 50 mg via ORAL
  Filled 2014-09-06: qty 1

## 2014-09-06 MED ORDER — AZITHROMYCIN 250 MG PO TABS: ORAL_TABLET | ORAL | Status: DC

## 2014-09-06 NOTE — Care Management (Signed)
Spoke with patients son Mr Thall,  He has arranged for his sister Pieter Partridge to pick up the patient today at 70 for outpatient HD.  RN CM signing off

## 2014-09-06 NOTE — Progress Notes (Signed)
Pt to be discharged today. Iv removed. disch instructions given to pt's daughter as well as prescrips. Pt to have o/p dislysis this afternoon.

## 2014-09-06 NOTE — Care Management (Signed)
Spoke with patients son at [phone].  He will be here to pick up the patient between 1030-1100 to take patient to outpatient HD.  Nurse Manuela Schwartz is aware and will have discharge information prepared.

## 2014-09-07 NOTE — Discharge Summary (Signed)
Kelsey Peterson, is a 59 y.o. female  DOB 08/14/1955  MRN [id number].  Admission date:  09/01/2014  Admitting Physician  Demetrios Loll, MD  Discharge Date:  09/06/2014   Primary MD  No primary care provider on file.  Recommendations for primary care physician for things to follow:   Follow-up  today at the dialysis center for regular hemodialysis.  Admission Diagnosis  Hyperkalemia [E87.5] Congestive heart failure, unspecified congestive heart failure chronicity, unspecified congestive heart failure type [I50.9]   Discharge Diagnosis  Hyperkalemia [E87.5] Congestive heart failure, unspecified congestive heart failure chronicity, unspecified congestive heart failure type [I50.9]    Principal Problem:   Hyperkalemia Active Problems:   Fluid overload   Malignant hypertension      Past Medical History  Diagnosis Date  . COPD (chronic obstructive pulmonary disease)   . ESRD on hemodialysis   . DM2 (diabetes mellitus, type 2)   . Hepatitis C   . Migraine with aura   . HOH (hard of hearing)   . Mitral regurgitation     a. echo 06/2014: EF 60%, no WMA, GR1DD, mild AI, calcified mitral annulus, mod MR, no atrial septal defect or patent foramen ovale   . Cognitive impairment   . CAD (coronary artery disease)   . HTN (hypertension)     Past Surgical History  Procedure Laterality Date  . Dialysis fistula creation    . Tubal ligation    . Colonoscopy with propofol Left 07/09/2014    Procedure: COLONOSCOPY WITH PROPOFOL;  Surgeon: Hulen Luster, MD;  Location: [facility] ENDOSCOPY;  Service: Endoscopy;  Laterality: Left;  . Colonoscopy N/A 07/15/2014    Procedure: COLONOSCOPY;  Surgeon: Manya Silvas, MD;  Location: [facility] ENDOSCOPY;  Service: Endoscopy;  Laterality: N/A;       History of present illness and  Hospital Course:      Kindly see H&P for history of present illness and admission details, please review complete Labs, Consult reports and Test reports for all details in brief  HPI  from the history and physical done on the day of admission 59 YR OLD  female patient with hypertension, diabetes, ESRD admitted because of shortness of breath. Patient found to have pedal edema weight gain. Missed hemodialysis session on the day of admission secondary to feeling sick. found to have severe hyperkalemia potassium of 6   Hospital Course  #1 severe hyperkalemia patient admitted to telemetry, emergency hemodialysis the done. Seen by nephrology. And tolerated the dialysis well..[potassium decreased from 6-5.3.  #2 ESRD on hemodialysis on Tuesday Thursday and Saturday schedule. Patient discharged this morning and advised to follow-up with outpatient dialysis at 11:00. #3 acute respiratory failure secondary to pneumonia: CT chest confirmed pneumonia continued on IV antipyrotic and oxygen. She did have some wheezing so we gave her nebulizers patient tablet the knee improved, patient is on oxygen 2-3 L at home. Patient advised to continue oxygen. Head and gave prescriptions for inhalers, tapering course of steroids,azithromycin. #4 hypertension controlled Hyperparathyroidism Anemia of chronic kidney disease   physical therapy recommended home health physical therapy. Discharge Condition: stable.   Follow UP      Discharge Instructions  and  Discharge Medications       Medication List    TAKE these medications        ADVAIR DISKUS 250-50 MCG/DOSE Aepb  Generic drug:  Fluticasone-Salmeterol  Inhale 1 puff into the lungs 2 (two) times daily.     albuterol 108 (90 BASE) MCG/ACT  inhaler  Commonly known as:  PROVENTIL HFA;VENTOLIN HFA  Inhale 2 puffs into the lungs every 6 (six) hours as needed for wheezing or shortness of breath.     amiodarone 200 MG tablet  Commonly known as:  PACERONE  Take 1 tablet (200 mg  total) by mouth 2 (two) times daily.     amLODipine 5 MG tablet  Commonly known as:  NORVASC  Take 5 mg by mouth daily.     azithromycin 250 MG tablet  Commonly known as:  ZITHROMAX  Take daily     calcium acetate 667 MG capsule  Commonly known as:  PHOSLO  Take 1 capsule (667 mg total) by mouth 3 (three) times daily with meals.     calcium carbonate 500 MG chewable tablet  Commonly known as:  TUMS - dosed in mg elemental calcium  Chew 1 tablet by mouth daily.     cinacalcet 60 MG tablet  Commonly known as:  SENSIPAR  Take 60 mg by mouth daily. With largest meal     clopidogrel 75 MG tablet  Commonly known as:  PLAVIX  Take 75 mg by mouth daily.     esomeprazole 40 MG capsule  Commonly known as:  NEXIUM  Take 40 mg by mouth daily.     ferrous sulfate 325 (65 FE) MG tablet  Take 325 mg by mouth daily.     folic acid-vitamin b complex-vitamin c-selenium-zinc 3 MG Tabs tablet  Take 1 tablet by mouth daily.     furosemide 80 MG tablet  Commonly known as:  LASIX  Take 80 mg by mouth daily. Patient takes on non dialysis days.     isosorbide mononitrate 30 MG 24 hr tablet  Commonly known as:  IMDUR  Take 30 mg by mouth daily.     Lidocaine-Prilocaine (Bulk) 4.0-1.0 % Crea  1 application by Other route as needed (for tx). Apply to site 20-45 minutes before tx.     metoprolol tartrate 25 MG tablet  Commonly known as:  LOPRESSOR  Take 0.5 tablets (12.5 mg total) by mouth 2 (two) times daily.     ondansetron 4 MG tablet  Commonly known as:  ZOFRAN  Take 4 mg by mouth every 8 (eight) hours as needed. For nausea and vomiting     predniSONE 10 MG tablet  Commonly known as:  DELTASONE  3 tab daily for 2 days 2 tab daily for 2 days 1 tab daily for 2 days     traMADol 50 MG tablet  Commonly known as:  ULTRAM  Take 50 mg by mouth 2 (two) times daily as needed for moderate pain.          Diet and Activity recommendation: See Discharge Instructions above   Consults  obtained - nephrology   Major procedures and Radiology Reports - PLEASE review detailed and final reports for all details, in brief -      Dg Chest 2 View  09/01/2014   CLINICAL DATA:  58 year old female with history of shortness of breath since yesterday.  EXAM: CHEST  2 VIEW  COMPARISON:  07/06/2014.  FINDINGS: Lung volumes are low. Opacity at the left base likely to reflect a chronic moderate left pleural effusion with associated atelectasis in the left lower lobe. There is cephalization of the pulmonary vasculature and slight indistinctness of the interstitial markings suggestive of mild pulmonary edema. Mild cardiomegaly. Upper mediastinal contours appear widened, favored to be related to vascular congestion.  IMPRESSION: 1. Findings suggestive  of congestive heart failure, as above. 2. Moderate chronic left pleural effusion with associated passive subsegmental atelectasis in the left lower lobe. 3. Widening of the mediastinal contours, similar to some prior examinations, favored in this case to be related to vascular congestion.   Electronically Signed   By: Vinnie Langton M.D.   On: 09/01/2014 14:43   Ct Chest Wo Contrast  09/02/2014   CLINICAL DATA:  59 year old female with increasing shortness of breath for 2 days. End-stage renal disease, hypertension, diabetes. Initial encounter.  EXAM: CT CHEST WITHOUT CONTRAST  TECHNIQUE: Multidetector CT imaging of the chest was performed following the standard protocol without IV contrast.  COMPARISON:  Chest radiographs 09/01/2014 and earlier. CT Abdomen and Pelvis 01/18/2014. Chest CTA 09/24/2013.  FINDINGS: Right IJ approach dual lumen dialysis type catheter in place. Major airways are patent. Small to moderate left and small right layering pleural effusions. Cardiomegaly. No pericardial effusion.  Superimposed bilateral perihilar crowding of lung markings, and in the inferior left upper lobe, lingula, and much of the left lower lobe there is  consolidation with air bronchograms (coronal image 92). Occasional patchy peribronchovascular opacity in the right lung (series 3, image 29).  No mediastinal or axillary lymphadenopathy. Mildly increased subcutaneous fat stranding throughout.  Cholelithiasis, with gallstones up to 26 mm diameter. Negative visualized noncontrast liver, spleen, pancreas, and bowel in the upper abdomen. Native renal atrophy and adrenal hyperplasia.  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 Benign vertebral body hemangioma at L1. At T9-T10 there is new moderate to severe endplate irregularity and disc space loss. There is associated endplate osteophytosis. There is no paraspinal phlegmon identified. This appearance is new since December 2015. Otherwise stable osseous structures.  IMPRESSION: 1. Cardiomegaly with a degree of volume overload, including left greater than right layering pleural effusions. Multilobar consolidation on the left more suggestive of pneumonia than atelectasis. 2. Abnormal appearance of the T9-T10 disc and endplates is new since December 2015. No paraspinal inflammation to strongly suggest acute discitis osteomyelitis. But if there is thoracic back pain or bacteremia then recommend follow-up noncontrast Thoracic MRI. 3. Cholelithiasis.   Electronically Signed   By: Genevie Ann M.D.   On: 09/02/2014 10:27    Micro Results    Recent Results (from the past 240 hour(s))  MRSA PCR Screening     Status: Abnormal   Collection Time: 09/03/14  1:46 PM  Result Value Ref Range Status   MRSA by PCR POSITIVE (A) NEGATIVE Final    Comment:        The GeneXpert MRSA Assay (FDA approved for NASAL specimens only), is one component of a comprehensive MRSA colonization surveillance program. It is not intended to diagnose MRSA infection nor to guide or monitor treatment for MRSA infections. CALLED TO TONYA TROUTMAN 09/03/2014 1515 [facility]        Today   Subjective:   Kelsey Peterson today has  no headache,no chest abdominal pain,no new weakness tingling or numbness, feels much better wants to go home today.   Objective:   Blood pressure 144/74, pulse 76, temperature 98 F (36.7 C), temperature source Oral, resp. rate 20, height 5\' 2"  (1.575 m), weight 88.9 kg (195 lb 15.8 oz), SpO2 92 %.  No intake or output data in the 24 hours ending 09/07/14 1243  Exam Awake Alert, Oriented x 3, No new F.N deficits, Normal affect [place].AT,PERRAL Supple Neck,No JVD, No cervical lymphadenopathy appriciated.  Symmetrical Chest wall movement, Good air movement bilaterally, CTAB RRR,No Gallops,Rubs or  new Murmurs, No Parasternal Heave +ve B.Sounds, Abd Soft, Non tender, No organomegaly appriciated, No rebound -guarding or rigidity. No Cyanosis, Clubbing or edema, No new Rash or bruise  Data Review   CBC w Diff:  Lab Results  Component Value Date   WBC 6.0 09/04/2014   WBC 6.4 02/24/2014   HGB 7.5* 09/04/2014   HGB 7.3* 02/25/2014   HCT 22.8* 09/04/2014   HCT 20.4* 02/24/2014   PLT 185 09/04/2014   PLT 273 02/24/2014   LYMPHOPCT 7 07/03/2014   LYMPHOPCT 13.1 02/24/2014   MONOPCT 6 07/03/2014   MONOPCT 8.6 02/24/2014   EOSPCT 1 07/03/2014   EOSPCT 1.7 02/24/2014   BASOPCT 0 07/03/2014   BASOPCT 0.6 02/24/2014    CMP:  Lab Results  Component Value Date   NA 139 09/02/2014   NA 140 02/24/2014   K 5.3* 09/02/2014   K 5.2* 02/24/2014   CL 96* 09/02/2014   CL 102 02/24/2014   CO2 33* 09/02/2014   CO2 33* 02/24/2014   BUN 15 09/02/2014   BUN 23* 02/24/2014   CREATININE 4.98* 09/02/2014   CREATININE 7.92* 02/24/2014   PROT 6.3* 07/13/2014   PROT 6.2* 01/15/2014   ALBUMIN 2.5* 07/14/2014   ALBUMIN 2.4* 02/24/2014   BILITOT 0.9 07/13/2014   BILITOT 1.3* 01/15/2014   ALKPHOS 66 07/13/2014   ALKPHOS 127* 01/15/2014   AST 38 07/13/2014   AST 33 01/15/2014   ALT 33 07/13/2014   ALT 21 01/15/2014  .   Total Time in preparing paper work, data evaluation and todays exam - 55  minutes  Reed Dady M.D on 09/06/2014 at 12:43 PM

## 2014-09-08 NOTE — Care Management Note (Signed)
Case Management Note  Patient Details  Name: Kelsey Peterson MRN: [id number] Date of Birth: Apr 22, 1955  Subjective/Objective:         Received a call from Sharmon Revere at [facility] per no resumption of care orders sent to [facility]. Orders and all discharge information was faxed to [facility] on 09/08/14.           Action/Plan:   Expected Discharge Date:                  Expected Discharge Plan:     In-House Referral:     Discharge planning Services     Post Acute Care Choice:    Choice offered to:     DME Arranged:    DME Agency:     HH Arranged:    [place] Agency:     Status of Service:     Medicare Important Message Given:  Yes-third notification given Date Medicare IM Given:    Medicare IM give by:    Date Additional Medicare IM Given:    Additional Medicare Important Message give by:     If discussed at [place] of Stay Meetings, dates discussed:    Additional Comments:  Ceili Boshers A, RN 09/08/2014, 8:49 AM

## 2014-09-08 NOTE — Progress Notes (Signed)
Resume home health PT and RN with [facility]. V.O. Dr Quincy Simmonds, RN, BSN, CM.

## 2014-09-29 LAB — TYPE AND SCREEN
ABO/RH(D): O NEG
Antibody Screen: NEGATIVE
UNIT DIVISION: 0
Unit division: 0
Unit division: 0

## 2014-11-02 ENCOUNTER — Encounter: Payer: Self-pay | Admitting: *Deleted

## 2014-11-02 ENCOUNTER — Emergency Department
Admission: EM | Admit: 2014-11-02 | Discharge: 2014-11-02 | Disposition: A | Discharge status: Home / Self Care | Payer: Medicare Other | Attending: Emergency Medicine | Admitting: Emergency Medicine

## 2014-11-02 ENCOUNTER — Emergency Department: Payer: Medicare Other

## 2014-11-02 DIAGNOSIS — M62830 Muscle spasm of back: Secondary | ICD-10-CM | POA: Insufficient documentation

## 2014-11-02 DIAGNOSIS — R0682 Tachypnea, not elsewhere classified: Secondary | ICD-10-CM | POA: Insufficient documentation

## 2014-11-02 DIAGNOSIS — Z792 Long term (current) use of antibiotics: Secondary | ICD-10-CM | POA: Diagnosis not present

## 2014-11-02 DIAGNOSIS — M545 Low back pain, unspecified: Secondary | ICD-10-CM

## 2014-11-02 DIAGNOSIS — Z8709 Personal history of other diseases of the respiratory system: Secondary | ICD-10-CM | POA: Diagnosis not present

## 2014-11-02 DIAGNOSIS — Z992 Dependence on renal dialysis: Secondary | ICD-10-CM | POA: Diagnosis not present

## 2014-11-02 DIAGNOSIS — E119 Type 2 diabetes mellitus without complications: Secondary | ICD-10-CM | POA: Diagnosis not present

## 2014-11-02 DIAGNOSIS — I120 Hypertensive chronic kidney disease with stage 5 chronic kidney disease or end stage renal disease: Secondary | ICD-10-CM | POA: Diagnosis not present

## 2014-11-02 DIAGNOSIS — N186 End stage renal disease: Secondary | ICD-10-CM | POA: Insufficient documentation

## 2014-11-02 DIAGNOSIS — Z7952 Long term (current) use of systemic steroids: Secondary | ICD-10-CM | POA: Insufficient documentation

## 2014-11-02 LAB — CBC WITH DIFFERENTIAL/PLATELET
BASOS ABS: 0.2 10*3/uL — AB (ref 0–0.1)
BASOS PCT: 2 %
EOS PCT: 1 %
Eosinophils Absolute: 0.1 10*3/uL (ref 0–0.7)
HCT: 19.9 % — ABNORMAL LOW (ref 35.0–47.0)
Hemoglobin: 6.4 g/dL — ABNORMAL LOW (ref 12.0–16.0)
Lymphocytes Relative: 8 %
Lymphs Abs: 0.8 10*3/uL — ABNORMAL LOW (ref 1.0–3.6)
MCH: 24.3 pg — ABNORMAL LOW (ref 26.0–34.0)
MCHC: 32.2 g/dL (ref 32.0–36.0)
MCV: 75.6 fL — ABNORMAL LOW (ref 80.0–100.0)
MONO ABS: 0.6 10*3/uL (ref 0.2–0.9)
MONOS PCT: 5 %
Neutro Abs: 8.5 10*3/uL — ABNORMAL HIGH (ref 1.4–6.5)
Neutrophils Relative %: 84 %
PLATELETS: 356 10*3/uL (ref 150–440)
RBC: 2.63 MIL/uL — ABNORMAL LOW (ref 3.80–5.20)
RDW: 21.6 % — AB (ref 11.5–14.5)
WBC: 10.1 10*3/uL (ref 3.6–11.0)

## 2014-11-02 LAB — BASIC METABOLIC PANEL
ANION GAP: 10 (ref 5–15)
BUN: 12 mg/dL (ref 6–20)
CALCIUM: 8.8 mg/dL — AB (ref 8.9–10.3)
CO2: 33 mmol/L — AB (ref 22–32)
CREATININE: 4.86 mg/dL — AB (ref 0.44–1.00)
Chloride: 97 mmol/L — ABNORMAL LOW (ref 101–111)
GFR, EST AFRICAN AMERICAN: 10 mL/min — AB (ref >60)
GFR, EST NON AFRICAN AMERICAN: 9 mL/min — AB (ref >60)
GLUCOSE: 88 mg/dL (ref 65–99)
Potassium: 4.2 mmol/L (ref 3.5–5.1)
Sodium: 140 mmol/L (ref 135–145)

## 2014-11-02 MED ORDER — OXYCODONE HCL 5 MG PO TABS
5.0000 mg | ORAL_TABLET | ORAL | Status: DC | PRN
Start: 2014-11-02 — End: 2015-01-11

## 2014-11-02 MED ORDER — OXYCODONE HCL 5 MG PO TABS
5.0000 mg | ORAL_TABLET | Freq: Once | ORAL | Status: AC
  Administered 2014-11-02: 5 mg via ORAL
  Filled 2014-11-02: qty 1

## 2014-11-02 MED ORDER — DIAZEPAM 5 MG PO TABS
5.0000 mg | ORAL_TABLET | Freq: Once | ORAL | Status: AC
  Administered 2014-11-02: 5 mg via ORAL
  Filled 2014-11-02: qty 1

## 2014-11-02 MED ORDER — DIAZEPAM 2 MG PO TABS: 2.0000 mg | ORAL_TABLET | Freq: Three times a day (TID) | ORAL | Status: DC | PRN

## 2014-11-02 NOTE — ED Notes (Signed)
Pt reports mid back pain after coming off of dialysis yesterday, states whenever she moves around.

## 2014-11-02 NOTE — ED Notes (Signed)
Called pt family.  Family on the way to pick up pt.

## 2014-11-02 NOTE — Discharge Instructions (Signed)
You have notable tenderness and spasm in your paraspinal muscles in your lumbar active. Take Valium, 2 mg, 3-4 times a day as needed for muscle spasm. If pain is more severe, he may take oxycodone on every 4-6 hours. Follow-up with your regular doctor. Return to the emergency department if you have uncontrolled pain, fever, or other urgent concerns.  Back Pain, Adult Back pain is very common. The pain often gets better over time. The cause of back pain is usually not dangerous. Most people can learn to manage their back pain on their own.  HOME CARE  Watch your back pain for any changes. The following actions may help to lessen any pain you are feeling:  Stay active. Start with short walks on flat ground if you can. Try to walk farther each day.  Exercise regularly as told by your doctor. Exercise helps your back heal faster. It also helps avoid future injury by keeping your muscles strong and flexible.  Do not sit, drive, or stand in one place for more than 30 minutes.  Do not stay in bed. Resting more than 1-2 days can slow down your recovery.  Be careful when you bend or lift an object. Use good form when lifting:  Bend at your knees.  Keep the object close to your body.  Do not twist.  Sleep on a firm mattress. Lie on your side, and bend your knees. If you lie on your back, put a pillow under your knees.  Take medicines only as told by your doctor.  Put ice on the injured area.  Put ice in a plastic bag.  Place a towel between your skin and the bag.  Leave the ice on for 20 minutes, 2-3 times a day for the first 2-3 days. After that, you can switch between ice and heat packs.  Avoid feeling anxious or stressed. Find good ways to deal with stress, such as exercise.  Maintain a healthy weight. Extra weight puts stress on your back. GET HELP IF:   You have pain that does not go away with rest or medicine.  You have worsening pain that goes down into your legs or  buttocks.  You have pain that does not get better in one week.  You have pain at night.  You lose weight.  You have a fever or chills. GET HELP RIGHT AWAY IF:   You cannot control when you poop (bowel movement) or pee (urinate).  Your arms or legs feel weak.  Your arms or legs lose feeling (numbness).  You feel sick to your stomach (nauseous) or throw up (vomit).  You have belly (abdominal) pain.  You feel like you may pass out (faint).   This information is not intended to replace advice given to you by your health care provider. Make sure you discuss any questions you have with your health care provider.   Document Released: 06/24/2007 Document Revised: 01/26/2014 Document Reviewed: 05/09/2013 Elsevier Interactive Patient Education [employer].

## 2014-11-02 NOTE — ED Provider Notes (Signed)
[facility] Emergency Department Provider Note  ____________________________________________  Time seen: 1404   I have reviewed the triage vital signs and the nursing notes.   HISTORY  Chief Complaint Back Pain     HPI Kelsey Peterson is a 59 y.o. female who reports she began to have lower back pain yesterday when she finished her dialysis session. She reports it hurts a great deal whenever she bends over to pick anything up or whenever she tries to lift anything. She is uncomfortable in most positions. She reports she has had some lower back pain the past but this is worse than usual. She is not on any pain medications and has not taken any today.  The patient denies any weakness or loss of sensation in her legs. She usually walks with a cane. She does feel a little more short of breath and is currently using her regular supplemental oxygen. She reports the discomfort makes her breathe faster. She denies any nausea vomiting or chest pain.    Past Medical History  Diagnosis Date  . COPD (chronic obstructive pulmonary disease) ([place])   . ESRD on hemodialysis ([place])   . DM2 (diabetes mellitus, type 2) ([place])   . Hepatitis C   . Migraine with aura   . HOH (hard of hearing)   . Mitral regurgitation     a. echo 06/2014: EF 60%, no WMA, GR1DD, mild AI, calcified mitral annulus, mod MR, no atrial septal defect or patent foramen ovale   . Cognitive impairment   . CAD (coronary artery disease)   . HTN (hypertension)     Patient Active Problem List   Diagnosis Date Noted  . Hyperkalemia 09/01/2014  . Fluid overload 09/01/2014  . Malignant hypertension 09/01/2014  . GI bleed 07/14/2014  . GERD (gastroesophageal reflux disease) 07/14/2014  . COPD (chronic obstructive pulmonary disease) ([place]) 07/14/2014  . HTN (hypertension) 07/14/2014  . CAD (coronary artery disease) 07/14/2014  . End stage renal disease on dialysis ([place])   . Supraventricular tachycardia  ([place])   . NSVT (nonsustained ventricular tachycardia) ([place])   . Pulmonary edema 07/03/2014  . Anemia 07/03/2014  . Pain in the chest   . Diarrhea   . NSTEMI (non-ST elevated myocardial infarction) ([place]) 06/21/2014    Past Surgical History  Procedure Laterality Date  . Dialysis fistula creation    . Tubal ligation    . Colonoscopy with propofol Left 07/09/2014    Procedure: COLONOSCOPY WITH PROPOFOL;  Surgeon: Hulen Luster, MD;  Location: [facility] ENDOSCOPY;  Service: Endoscopy;  Laterality: Left;  . Colonoscopy N/A 07/15/2014    Procedure: COLONOSCOPY;  Surgeon: Manya Silvas, MD;  Location: [facility] ENDOSCOPY;  Service: Endoscopy;  Laterality: N/A;    Current Outpatient Rx  Name  Route  Sig  Dispense  Refill  . ADVAIR DISKUS 250-50 MCG/DOSE AEPB   Inhalation   Inhale 1 puff into the lungs 2 (two) times daily.           Dispense as written.   Marland Kitchen albuterol (PROVENTIL HFA;VENTOLIN HFA) 108 (90 BASE) MCG/ACT inhaler   Inhalation   Inhale 2 puffs into the lungs every 6 (six) hours as needed for wheezing or shortness of breath.   1 Inhaler   2   . amiodarone (PACERONE) 200 MG tablet   Oral   Take 1 tablet (200 mg total) by mouth 2 (two) times daily.   60 tablet   1   . amLODipine (NORVASC) 5 MG tablet  Oral   Take 5 mg by mouth daily.         Marland Kitchen azithromycin (ZITHROMAX) 250 MG tablet      Take daily   5 each   0   . calcium acetate (PHOSLO) 667 MG capsule   Oral   Take 1 capsule (667 mg total) by mouth 3 (three) times daily with meals.   90 capsule   1   . calcium carbonate (TUMS - DOSED IN MG ELEMENTAL CALCIUM) 500 MG chewable tablet   Oral   Chew 1 tablet by mouth daily.         . cinacalcet (SENSIPAR) 60 MG tablet   Oral   Take 60 mg by mouth daily. With largest meal         . clopidogrel (PLAVIX) 75 MG tablet   Oral   Take 75 mg by mouth daily.         . diazepam (VALIUM) 2 MG tablet   Oral   Take 1 tablet (2 mg total) by mouth every 8 (eight) hours  as needed for muscle spasms.   15 tablet   0   . esomeprazole (NEXIUM) 40 MG capsule   Oral   Take 40 mg by mouth daily.          . ferrous sulfate 325 (65 FE) MG tablet   Oral   Take 325 mg by mouth daily.         . folic acid-vitamin b complex-vitamin c-selenium-zinc (DIALYVITE) 3 MG TABS tablet   Oral   Take 1 tablet by mouth daily.         . furosemide (LASIX) 80 MG tablet   Oral   Take 80 mg by mouth daily. Patient takes on non dialysis days.         . isosorbide mononitrate (IMDUR) 30 MG 24 hr tablet   Oral   Take 30 mg by mouth daily.         . Lidocaine-Prilocaine, Bulk, 2.5-2.5 % CREA   Other   1 application by Other route as needed (for tx). Apply to site 20-45 minutes before tx.         . metoprolol tartrate (LOPRESSOR) 25 MG tablet   Oral   Take 0.5 tablets (12.5 mg total) by mouth 2 (two) times daily.   30 tablet   1   . ondansetron (ZOFRAN) 4 MG tablet   Oral   Take 4 mg by mouth every 8 (eight) hours as needed. For nausea and vomiting         . oxyCODONE (OXY IR/ROXICODONE) 5 MG immediate release tablet   Oral   Take 1 tablet (5 mg total) by mouth every 4 (four) hours as needed for severe pain.   10 tablet   0   . predniSONE (DELTASONE) 10 MG tablet      3 tab daily for 2 days 2 tab daily for 2 days 1 tab daily for 2 days   12 tablet   0   . traMADol (ULTRAM) 50 MG tablet   Oral   Take 50 mg by mouth 2 (two) times daily as needed for moderate pain.           Allergies Contrast media; Morphine and related; and Other  Family History  Problem Relation Age of Onset  . Heart disease Mother   . Hypertension Mother     Social History Social History  Substance Use Topics  . Smoking status: Never Smoker   .  Smokeless tobacco: Never Used  . Alcohol Use: No    Review of Systems  Constitutional: Negative for fever. ENT: Negative for sore throat. Cardiovascular: Negative for chest pain. Respiratory: History of pulmonary  disease. Patient reports she is breathing fast and she is using her supplemental oxygen. Gastrointestinal: Negative for abdominal pain, vomiting and diarrhea. Genitourinary: Negative for dysuria. Musculoskeletal: Notable for lower back pain since yesterday.. Skin: Negative for rash. Neurological: Negative for paresthesia or weakness   10-point ROS otherwise negative.  ____________________________________________   PHYSICAL EXAM:  VITAL SIGNS: ED Triage Vitals  Enc Vitals Group     BP 11/02/14 1151 166/92 mmHg     Pulse Rate 11/02/14 1151 83     Resp 11/02/14 1151 20     Temp 11/02/14 1151 97.5 F (36.4 C)     Temp Source 11/02/14 1151 Oral     SpO2 11/02/14 1151 98 %     Weight 11/02/14 1151 185 lb (83.915 kg)     Height 11/02/14 1151 5\' 3"  (1.6 m)     Head Cir --      Peak Flow --      Pain Score 11/02/14 1149 10     Pain Loc --      Pain Edu? --      Excl. in [place]? --     Constitutional: Alert and oriented. Mildly tachypnea can appears a little bit uncomfortable. She is lying on her left side with her knees bent. ENT   Head: Normocephalic and atraumatic.   Nose: No congestion/rhinnorhea.    Cardiovascular: Normal rate at 89, regular rhythm, no murmur noted Respiratory:  Normal respiratory effort, mild tachypnea.    Breath sounds are clear and equal bilaterally.  Chest wall: Nontender. She has a dialysis catheter from the right chest without any erythema or tenderness. Gastrointestinal: Soft and nontender. No distention.  Back: No muscle spasm, no tenderness, no CVA tenderness. Musculoskeletal: Significant point tenderness through her old one through L4 level, paraspinals, without significant midline tenderness. Neurologic:  Normal speech and language. Good function of all 4 distal extremities. Intact sensation in both legs and feet. No gross focal neurologic deficits are appreciated.  Skin:  Skin is warm, dry. No rash noted. Psychiatric: Mood and affect are  normal. Speech and behavior are normal.  ____________________________________________    LABS (pertinent positives/negatives)  Labs Reviewed  CBC WITH DIFFERENTIAL/PLATELET - Abnormal; Notable for the following:    RBC 2.63 (*)    Hemoglobin 6.4 (*)    HCT 19.9 (*)    MCV 75.6 (*)    MCH 24.3 (*)    RDW 21.6 (*)    Neutro Abs 8.5 (*)    Lymphs Abs 0.8 (*)    Basophils Absolute 0.2 (*)    All other components within normal limits  BASIC METABOLIC PANEL - Abnormal; Notable for the following:    Chloride 97 (*)    CO2 33 (*)    Creatinine, Ser 4.86 (*)    Calcium 8.8 (*)    GFR calc non Af Amer 9 (*)    GFR calc Af Amer 10 (*)    All other components within normal limits     ____________________________________________  RADIOLOGY  Lumbar spine  IMPRESSION: No evidence of acute abnormality.  Mild multilevel degenerative disc disease.   ____________________________________________  ____________________________________________   INITIAL IMPRESSION / ASSESSMENT AND PLAN / ED COURSE  Pertinent labs & imaging results that were available during my care of the patient were  reviewed by me and considered in my medical decision making (see chart for details).  Patient with notable tenderness in her paraspinal muscles in her lumbar area. She has spasm and discomfort on exam. We will treat her with Percocet and Valium to attempt pain control and spasm control. I will order an x-ray of her lumbar spine given her complex middle situation.  ----------------------------------------- 4:26 PM on 11/02/2014 -----------------------------------------  Patient appears to be more comfortable this time. She does continue to complain of some discomfort. The x-ray itself does not show any acute bony abnormality. She does have multilevel degenerative disc disease.  Blood tests appear reasonable. She has her elevation in creatinine at 4.86 consistent with her renal failure. We will discharge  her home with prescriptions for Valium 2 mg and oxycodone to be used when necessary.  ____________________________________________   FINAL CLINICAL IMPRESSION(S) / ED DIAGNOSES  Final diagnoses:  Back muscle spasm  Bilateral low back pain without sciatica   chronic renal failure    Ahmed Prima, MD 11/02/14 [phone]

## 2014-11-02 NOTE — ED Notes (Signed)
To call pt family when ready for discharge. [phone]

## 2014-11-02 NOTE — ED Notes (Signed)
Patient transported to X-ray 

## 2014-11-14 ENCOUNTER — Ambulatory Visit
Admission: RE | Admit: 2014-11-14 | Discharge: 2014-11-14 | Disposition: A | Discharge status: Home / Self Care | Payer: Medicare Other | Source: Ambulatory Visit | Attending: Nephrology | Admitting: Nephrology

## 2014-11-14 DIAGNOSIS — J189 Pneumonia, unspecified organism: Secondary | ICD-10-CM | POA: Diagnosis not present

## 2014-11-14 DIAGNOSIS — Z4901 Encounter for fitting and adjustment of extracorporeal dialysis catheter: Secondary | ICD-10-CM | POA: Diagnosis present

## 2014-11-14 DIAGNOSIS — N186 End stage renal disease: Secondary | ICD-10-CM | POA: Diagnosis not present

## 2014-11-14 DIAGNOSIS — R7881 Bacteremia: Secondary | ICD-10-CM | POA: Diagnosis not present

## 2014-11-14 LAB — PREPARE RBC (CROSSMATCH)

## 2014-11-14 LAB — HEMOGLOBIN AND HEMATOCRIT, BLOOD
HCT: 20.7 % — ABNORMAL LOW (ref 35.0–47.0)
Hemoglobin: 6.6 g/dL — ABNORMAL LOW (ref 12.0–16.0)

## 2014-11-14 LAB — POTASSIUM: Potassium: 3.1 mmol/L — ABNORMAL LOW (ref 3.5–5.1)

## 2014-11-14 MED ORDER — SODIUM CHLORIDE 0.9 % IV SOLN
INTRAVENOUS | Status: DC
  Administered 2014-11-14: 10:00:00 via INTRAVENOUS

## 2014-11-14 NOTE — Progress Notes (Signed)
Blood infusion complete , 2 units infused , pt. Tolerated well , d/cd via wheelchair daughter transorted home

## 2014-11-14 NOTE — Progress Notes (Signed)
Lab draw for type/screen , hgb, potassium obtained

## 2014-11-14 NOTE — Progress Notes (Signed)
Pt presents to SDS via Wheelchair for blood transfusion with daughter.

## 2014-11-15 LAB — TYPE AND SCREEN
ABO/RH(D): O NEG
Antibody Screen: NEGATIVE
UNIT DIVISION: 0
Unit division: 0

## 2014-11-29 NOTE — Patient Outreach (Signed)
[facility]) Care Management  11/29/2014  Kelsey Peterson 11/13/1955 [id number]   Referral from [employer] tier 4 list, assigned Mariann Laster, RN to outreach for [place] Management services.  Thanks, Ronnell Freshwater. [place], [place] Assistant Phone: [phone] Fax: [phone]

## 2014-12-01 ENCOUNTER — Emergency Department: Payer: Medicare Other

## 2014-12-01 ENCOUNTER — Inpatient Hospital Stay
Admission: EM | Admit: 2014-12-01 | Discharge: 2014-12-07 | DRG: 314 | Disposition: A | Discharge status: Home Health | Payer: Medicare Other | Attending: Internal Medicine | Admitting: Internal Medicine

## 2014-12-01 ENCOUNTER — Inpatient Hospital Stay: Payer: Medicare Other

## 2014-12-01 ENCOUNTER — Encounter: Payer: Self-pay | Admitting: Emergency Medicine

## 2014-12-01 DIAGNOSIS — B955 Unspecified streptococcus as the cause of diseases classified elsewhere: Secondary | ICD-10-CM | POA: Diagnosis present

## 2014-12-01 DIAGNOSIS — N390 Urinary tract infection, site not specified: Secondary | ICD-10-CM | POA: Diagnosis present

## 2014-12-01 DIAGNOSIS — E876 Hypokalemia: Secondary | ICD-10-CM | POA: Diagnosis present

## 2014-12-01 DIAGNOSIS — I252 Old myocardial infarction: Secondary | ICD-10-CM | POA: Diagnosis not present

## 2014-12-01 DIAGNOSIS — R197 Diarrhea, unspecified: Secondary | ICD-10-CM | POA: Diagnosis present

## 2014-12-01 DIAGNOSIS — T827XXA Infection and inflammatory reaction due to other cardiac and vascular devices, implants and grafts, initial encounter: Principal | ICD-10-CM | POA: Diagnosis present

## 2014-12-01 DIAGNOSIS — Z8661 Personal history of infections of the central nervous system: Secondary | ICD-10-CM

## 2014-12-01 DIAGNOSIS — I340 Nonrheumatic mitral (valve) insufficiency: Secondary | ICD-10-CM | POA: Diagnosis present

## 2014-12-01 DIAGNOSIS — D631 Anemia in chronic kidney disease: Secondary | ICD-10-CM | POA: Diagnosis present

## 2014-12-01 DIAGNOSIS — E872 Acidosis, unspecified: Secondary | ICD-10-CM | POA: Diagnosis present

## 2014-12-01 DIAGNOSIS — J189 Pneumonia, unspecified organism: Secondary | ICD-10-CM | POA: Diagnosis present

## 2014-12-01 DIAGNOSIS — A408 Other streptococcal sepsis: Secondary | ICD-10-CM | POA: Diagnosis present

## 2014-12-01 DIAGNOSIS — Y838 Other surgical procedures as the cause of abnormal reaction of the patient, or of later complication, without mention of misadventure at the time of the procedure: Secondary | ICD-10-CM | POA: Diagnosis present

## 2014-12-01 DIAGNOSIS — I2510 Atherosclerotic heart disease of native coronary artery without angina pectoris: Secondary | ICD-10-CM | POA: Diagnosis present

## 2014-12-01 DIAGNOSIS — Z7952 Long term (current) use of systemic steroids: Secondary | ICD-10-CM

## 2014-12-01 DIAGNOSIS — J449 Chronic obstructive pulmonary disease, unspecified: Secondary | ICD-10-CM | POA: Diagnosis present

## 2014-12-01 DIAGNOSIS — I248 Other forms of acute ischemic heart disease: Secondary | ICD-10-CM | POA: Diagnosis present

## 2014-12-01 DIAGNOSIS — K219 Gastro-esophageal reflux disease without esophagitis: Secondary | ICD-10-CM | POA: Diagnosis present

## 2014-12-01 DIAGNOSIS — Z794 Long term (current) use of insulin: Secondary | ICD-10-CM

## 2014-12-01 DIAGNOSIS — E1122 Type 2 diabetes mellitus with diabetic chronic kidney disease: Secondary | ICD-10-CM | POA: Diagnosis present

## 2014-12-01 DIAGNOSIS — K921 Melena: Secondary | ICD-10-CM | POA: Diagnosis present

## 2014-12-01 DIAGNOSIS — Z6831 Body mass index (BMI) 31.0-31.9, adult: Secondary | ICD-10-CM

## 2014-12-01 DIAGNOSIS — Z8249 Family history of ischemic heart disease and other diseases of the circulatory system: Secondary | ICD-10-CM

## 2014-12-01 DIAGNOSIS — K8020 Calculus of gallbladder without cholecystitis without obstruction: Secondary | ICD-10-CM | POA: Diagnosis present

## 2014-12-01 DIAGNOSIS — E669 Obesity, unspecified: Secondary | ICD-10-CM | POA: Diagnosis present

## 2014-12-01 DIAGNOSIS — Z8614 Personal history of Methicillin resistant Staphylococcus aureus infection: Secondary | ICD-10-CM | POA: Diagnosis not present

## 2014-12-01 DIAGNOSIS — B1920 Unspecified viral hepatitis C without hepatic coma: Secondary | ICD-10-CM | POA: Diagnosis present

## 2014-12-01 DIAGNOSIS — Z992 Dependence on renal dialysis: Secondary | ICD-10-CM

## 2014-12-01 DIAGNOSIS — J440 Chronic obstructive pulmonary disease with acute lower respiratory infection: Secondary | ICD-10-CM | POA: Diagnosis present

## 2014-12-01 DIAGNOSIS — H9190 Unspecified hearing loss, unspecified ear: Secondary | ICD-10-CM | POA: Diagnosis present

## 2014-12-01 DIAGNOSIS — D500 Iron deficiency anemia secondary to blood loss (chronic): Secondary | ICD-10-CM | POA: Diagnosis present

## 2014-12-01 DIAGNOSIS — J159 Unspecified bacterial pneumonia: Secondary | ICD-10-CM | POA: Diagnosis present

## 2014-12-01 DIAGNOSIS — K8070 Calculus of gallbladder and bile duct without cholecystitis without obstruction: Secondary | ICD-10-CM | POA: Diagnosis present

## 2014-12-01 DIAGNOSIS — J181 Lobar pneumonia, unspecified organism: Secondary | ICD-10-CM

## 2014-12-01 DIAGNOSIS — N2581 Secondary hyperparathyroidism of renal origin: Secondary | ICD-10-CM | POA: Diagnosis present

## 2014-12-01 DIAGNOSIS — N186 End stage renal disease: Secondary | ICD-10-CM | POA: Diagnosis present

## 2014-12-01 DIAGNOSIS — A419 Sepsis, unspecified organism: Secondary | ICD-10-CM

## 2014-12-01 DIAGNOSIS — Z7902 Long term (current) use of antithrombotics/antiplatelets: Secondary | ICD-10-CM | POA: Diagnosis not present

## 2014-12-01 DIAGNOSIS — R17 Unspecified jaundice: Secondary | ICD-10-CM

## 2014-12-01 DIAGNOSIS — I120 Hypertensive chronic kidney disease with stage 5 chronic kidney disease or end stage renal disease: Secondary | ICD-10-CM | POA: Diagnosis present

## 2014-12-01 LAB — CBC
HCT: 24.6 % — ABNORMAL LOW (ref 35.0–47.0)
HEMOGLOBIN: 7.8 g/dL — AB (ref 12.0–16.0)
MCH: 25.6 pg — AB (ref 26.0–34.0)
MCHC: 31.5 g/dL — AB (ref 32.0–36.0)
MCV: 81.4 fL (ref 80.0–100.0)
PLATELETS: 281 10*3/uL (ref 150–440)
RBC: 3.02 MIL/uL — AB (ref 3.80–5.20)
RDW: 25.5 % — ABNORMAL HIGH (ref 11.5–14.5)
WBC: 23.8 10*3/uL — ABNORMAL HIGH (ref 3.6–11.0)

## 2014-12-01 LAB — MRSA PCR SCREENING: MRSA BY PCR: POSITIVE — AB

## 2014-12-01 LAB — COMPREHENSIVE METABOLIC PANEL
ALBUMIN: 3 g/dL — AB (ref 3.5–5.0)
ALK PHOS: 76 U/L (ref 38–126)
ALT: 19 U/L (ref 14–54)
AST: 34 U/L (ref 15–41)
Anion gap: 10 (ref 5–15)
BILIRUBIN TOTAL: 2.7 mg/dL — AB (ref 0.3–1.2)
BUN: 7 mg/dL (ref 6–20)
CALCIUM: 8.3 mg/dL — AB (ref 8.9–10.3)
CO2: 27 mmol/L (ref 22–32)
CREATININE: 2.75 mg/dL — AB (ref 0.44–1.00)
Chloride: 99 mmol/L — ABNORMAL LOW (ref 101–111)
GFR calc Af Amer: 20 mL/min — ABNORMAL LOW (ref >60)
GFR, EST NON AFRICAN AMERICAN: 18 mL/min — AB (ref >60)
GLUCOSE: 131 mg/dL — AB (ref 65–99)
POTASSIUM: 3.2 mmol/L — AB (ref 3.5–5.1)
Sodium: 136 mmol/L (ref 135–145)
TOTAL PROTEIN: 7.6 g/dL (ref 6.5–8.1)

## 2014-12-01 LAB — URINALYSIS COMPLETE WITH MICROSCOPIC (ARMC ONLY)
BILIRUBIN URINE: NEGATIVE
GLUCOSE, UA: NEGATIVE mg/dL
Ketones, ur: NEGATIVE mg/dL
Nitrite: NEGATIVE
Protein, ur: >500 mg/dL — AB
Specific Gravity, Urine: 1.016 (ref 1.005–1.030)
pH: 7 (ref 5.0–8.0)

## 2014-12-01 LAB — LIPASE, BLOOD: LIPASE: 25 U/L (ref 11–51)

## 2014-12-01 LAB — LACTIC ACID, PLASMA
LACTIC ACID, VENOUS: 4.7 mmol/L — AB (ref 0.5–2.0)
LACTIC ACID, VENOUS: 1.2 mmol/L (ref 0.5–2.0)
LACTIC ACID, VENOUS: 1.2 mmol/L (ref 0.5–2.0)

## 2014-12-01 LAB — TROPONIN I: Troponin I: 0.62 ng/mL — ABNORMAL HIGH (ref <0.031)

## 2014-12-01 MED ORDER — CETYLPYRIDINIUM CHLORIDE 0.05 % MT LIQD
7.0000 mL | Freq: Two times a day (BID) | OROMUCOSAL | Status: DC
  Administered 2014-12-01 – 2014-12-03 (×3): 7 mL via OROMUCOSAL

## 2014-12-01 MED ORDER — HYDRALAZINE HCL 20 MG/ML IJ SOLN
10.0000 mg | Freq: Four times a day (QID) | INTRAMUSCULAR | Status: DC | PRN
Start: 2014-12-01 — End: 2014-12-07

## 2014-12-01 MED ORDER — INSULIN ASPART 100 UNIT/ML ~~LOC~~ SOLN: 0.0000 [IU] | Freq: Three times a day (TID) | SUBCUTANEOUS | Status: DC

## 2014-12-01 MED ORDER — FERROUS SULFATE 325 (65 FE) MG PO TABS
325.0000 mg | ORAL_TABLET | Freq: Every day | ORAL | Status: DC
  Administered 2014-12-01 – 2014-12-04 (×3): 325 mg via ORAL
  Filled 2014-12-01 (×3): qty 1

## 2014-12-01 MED ORDER — METOPROLOL TARTRATE 25 MG PO TABS
12.5000 mg | ORAL_TABLET | Freq: Two times a day (BID) | ORAL | Status: DC
  Administered 2014-12-01 – 2014-12-04 (×4): 12.5 mg via ORAL
  Filled 2014-12-01 (×5): qty 1

## 2014-12-01 MED ORDER — ALBUTEROL SULFATE (2.5 MG/3ML) 0.083% IN NEBU
2.5000 mg | INHALATION_SOLUTION | Freq: Four times a day (QID) | RESPIRATORY_TRACT | Status: DC
  Administered 2014-12-01 – 2014-12-03 (×8): 2.5 mg via RESPIRATORY_TRACT
  Filled 2014-12-01 (×8): qty 3

## 2014-12-01 MED ORDER — DIALYVITE 3000 3 MG PO TABS
1.0000 | ORAL_TABLET | Freq: Every day | ORAL | Status: DC
  Filled 2014-12-01 (×8): qty 1

## 2014-12-01 MED ORDER — PIPERACILLIN-TAZOBACTAM 3.375 G IVPB
3.3750 g | Freq: Once | INTRAVENOUS | Status: DC
  Filled 2014-12-01: qty 50

## 2014-12-01 MED ORDER — INSULIN ASPART 100 UNIT/ML ~~LOC~~ SOLN: 0.0000 [IU] | Freq: Every day | SUBCUTANEOUS | Status: DC

## 2014-12-01 MED ORDER — CINACALCET HCL 30 MG PO TABS
60.0000 mg | ORAL_TABLET | Freq: Every day | ORAL | Status: DC
  Administered 2014-12-03: 60 mg via ORAL
  Filled 2014-12-01 (×2): qty 2

## 2014-12-01 MED ORDER — CALCIUM CARBONATE ANTACID 500 MG PO CHEW
1.0000 | CHEWABLE_TABLET | Freq: Two times a day (BID) | ORAL | Status: DC
  Administered 2014-12-01 – 2014-12-03 (×3): 200 mg via ORAL
  Filled 2014-12-01 (×5): qty 1

## 2014-12-01 MED ORDER — MOMETASONE FURO-FORMOTEROL FUM 100-5 MCG/ACT IN AERO
2.0000 | INHALATION_SPRAY | Freq: Two times a day (BID) | RESPIRATORY_TRACT | Status: DC
  Administered 2014-12-01 – 2014-12-06 (×7): 2 via RESPIRATORY_TRACT
  Filled 2014-12-01 (×2): qty 8.8

## 2014-12-01 MED ORDER — SODIUM CHLORIDE 0.9 % IV SOLN: 250.0000 mL | INTRAVENOUS | Status: DC | PRN

## 2014-12-01 MED ORDER — DOCUSATE SODIUM 100 MG PO CAPS
100.0000 mg | ORAL_CAPSULE | Freq: Two times a day (BID) | ORAL | Status: DC
  Administered 2014-12-01 – 2014-12-04 (×5): 100 mg via ORAL
  Filled 2014-12-01 (×6): qty 1

## 2014-12-01 MED ORDER — PANTOPRAZOLE SODIUM 40 MG PO TBEC
40.0000 mg | DELAYED_RELEASE_TABLET | Freq: Every day | ORAL | Status: DC
  Administered 2014-12-01 – 2014-12-04 (×3): 40 mg via ORAL
  Filled 2014-12-01 (×3): qty 1

## 2014-12-01 MED ORDER — TRAMADOL HCL 50 MG PO TABS: 50.0000 mg | ORAL_TABLET | Freq: Two times a day (BID) | ORAL | Status: DC | PRN

## 2014-12-01 MED ORDER — SODIUM CHLORIDE 0.9 % IV BOLUS (SEPSIS)
500.0000 mL | Freq: Once | INTRAVENOUS | Status: AC
  Administered 2014-12-01: 500 mL via INTRAVENOUS

## 2014-12-01 MED ORDER — FUROSEMIDE 40 MG PO TABS
80.0000 mg | ORAL_TABLET | Freq: Every day | ORAL | Status: DC
  Administered 2014-12-03 – 2014-12-04 (×2): 80 mg via ORAL
  Filled 2014-12-01 (×2): qty 2

## 2014-12-01 MED ORDER — OXYCODONE HCL 5 MG PO TABS
5.0000 mg | ORAL_TABLET | ORAL | Status: DC | PRN
  Administered 2014-12-03 – 2014-12-04 (×3): 5 mg via ORAL
  Filled 2014-12-01 (×3): qty 1

## 2014-12-01 MED ORDER — PIPERACILLIN-TAZOBACTAM 3.375 G IVPB
3.3750 g | Freq: Two times a day (BID) | INTRAVENOUS | Status: DC
  Administered 2014-12-01 – 2014-12-04 (×7): 3.375 g via INTRAVENOUS
  Filled 2014-12-01 (×8): qty 50

## 2014-12-01 MED ORDER — ONDANSETRON HCL 4 MG/2ML IJ SOLN
4.0000 mg | Freq: Four times a day (QID) | INTRAMUSCULAR | Status: DC | PRN
  Administered 2014-12-03: 4 mg via INTRAVENOUS
  Filled 2014-12-01: qty 2

## 2014-12-01 MED ORDER — VANCOMYCIN HCL IN DEXTROSE 1-5 GM/200ML-% IV SOLN
1000.0000 mg | Freq: Once | INTRAVENOUS | Status: AC
  Administered 2014-12-01: 1000 mg via INTRAVENOUS
  Filled 2014-12-01: qty 200

## 2014-12-01 MED ORDER — ALBUTEROL SULFATE HFA 108 (90 BASE) MCG/ACT IN AERS: 2.0000 | INHALATION_SPRAY | Freq: Four times a day (QID) | RESPIRATORY_TRACT | Status: DC | PRN

## 2014-12-01 MED ORDER — LEVALBUTEROL HCL 1.25 MG/0.5ML IN NEBU: 1.2500 mg | INHALATION_SOLUTION | Freq: Four times a day (QID) | RESPIRATORY_TRACT | Status: DC

## 2014-12-01 MED ORDER — SODIUM CHLORIDE 0.9 % IJ SOLN
3.0000 mL | Freq: Two times a day (BID) | INTRAMUSCULAR | Status: DC
  Administered 2014-12-01 – 2014-12-04 (×6): 3 mL via INTRAVENOUS

## 2014-12-01 MED ORDER — DIAZEPAM 2 MG PO TABS: 2.0000 mg | ORAL_TABLET | Freq: Three times a day (TID) | ORAL | Status: DC | PRN

## 2014-12-01 MED ORDER — VANCOMYCIN HCL IN DEXTROSE 750-5 MG/150ML-% IV SOLN
750.0000 mg | INTRAVENOUS | Status: DC | PRN
  Filled 2014-12-01: qty 150

## 2014-12-01 MED ORDER — AMIODARONE HCL 200 MG PO TABS
200.0000 mg | ORAL_TABLET | Freq: Two times a day (BID) | ORAL | Status: DC
  Administered 2014-12-01 – 2014-12-04 (×4): 200 mg via ORAL
  Filled 2014-12-01 (×5): qty 1

## 2014-12-01 MED ORDER — VANCOMYCIN HCL 500 MG IV SOLR
500.0000 mg | Freq: Once | INTRAVENOUS | Status: AC
  Administered 2014-12-01: 500 mg via INTRAVENOUS
  Filled 2014-12-01: qty 500

## 2014-12-01 MED ORDER — ISOSORBIDE MONONITRATE ER 30 MG PO TB24
30.0000 mg | ORAL_TABLET | Freq: Every day | ORAL | Status: DC
  Administered 2014-12-01 – 2014-12-04 (×3): 30 mg via ORAL
  Filled 2014-12-01 (×3): qty 1

## 2014-12-01 MED ORDER — ACETAMINOPHEN 500 MG PO TABS
1000.0000 mg | ORAL_TABLET | Freq: Once | ORAL | Status: AC
  Administered 2014-12-01: 1000 mg via ORAL
  Filled 2014-12-01: qty 2

## 2014-12-01 MED ORDER — ACETAMINOPHEN 325 MG PO TABS: 650.0000 mg | ORAL_TABLET | Freq: Four times a day (QID) | ORAL | Status: DC | PRN

## 2014-12-01 MED ORDER — AMLODIPINE BESYLATE 5 MG PO TABS
5.0000 mg | ORAL_TABLET | Freq: Every day | ORAL | Status: DC
  Administered 2014-12-01 – 2014-12-04 (×3): 5 mg via ORAL
  Filled 2014-12-01 (×3): qty 1

## 2014-12-01 MED ORDER — LIDOCAINE-PRILOCAINE (BULK) 2.5-2.5 % CREA
1.0000 "application " | TOPICAL_CREAM | Status: DC | PRN
  Filled 2014-12-01: qty 1

## 2014-12-01 MED ORDER — SODIUM CHLORIDE 0.9 % IJ SOLN
3.0000 mL | INTRAMUSCULAR | Status: DC | PRN
  Administered 2014-12-01 – 2014-12-03 (×2): 3 mL via INTRAVENOUS
  Filled 2014-12-01 (×2): qty 10

## 2014-12-01 NOTE — ED Notes (Signed)
EMS called from dialysis to pick up pt with rectal bleeding. Upon arrival to ED, pt was sob, weak, c/o loose stool, no obvious bleeding noted in her stool from incontinent episode. Pt not answering questions, at this time, only concerned with getting to bsc. Dry cough noted.

## 2014-12-01 NOTE — ED Provider Notes (Signed)
[facility] Emergency Department Provider Note REMINDER - THIS NOTE IS NOT A FINAL MEDICAL RECORD UNTIL IT IS SIGNED. UNTIL THEN, THE CONTENT BELOW MAY REFLECT INFORMATION FROM A DOCUMENTATION TEMPLATE, NOT THE ACTUAL PATIENT VISIT. ____________________________________________  Time seen: Approximately 11:23 AM  I have reviewed the triage vital signs and the nursing notes.   HISTORY  Chief Complaint Shortness of Breath and Rectal Bleeding    HPI DEDRICK Peterson is a 59 y.o. female with a history of hemodialysis, mitral valve regurgitation, diabetes, COPD. Patient presents for rectal bleeding while at dialysis.  Patient reports that she is having back pain, which is not uncommon for her during dialysis recently. She is also feeling short of breath which she reports is chronic, and had an episode of blood in her stool at dialysis per report by EMS.  She denies nausea or vomiting, chest pain. She does report having shortness of breath though she states that she has been short of breath for entire life and this is not new. She does report a urge to urinate.  She denies cough, runny nose or wheezing. No known fever. She does have an implanted dialysis catheter in her right upper chest which was used today.   Past Medical History  Diagnosis Date  . COPD (chronic obstructive pulmonary disease) ([place])   . ESRD on hemodialysis ([place])   . DM2 (diabetes mellitus, type 2) ([place])   . Hepatitis C   . Migraine with aura   . HOH (hard of hearing)   . Mitral regurgitation     a. echo 06/2014: EF 60%, no WMA, GR1DD, mild AI, calcified mitral annulus, mod MR, no atrial septal defect or patent foramen ovale   . Cognitive impairment   . CAD (coronary artery disease)   . HTN (hypertension)     Patient Active Problem List   Diagnosis Date Noted  . Pneumonia 12/01/2014  . Sepsis ([place]) 12/01/2014  . UTI (lower urinary tract infection) 12/01/2014  . Hyperkalemia 09/01/2014   . Fluid overload 09/01/2014  . Malignant hypertension 09/01/2014  . GI bleed 07/14/2014  . GERD (gastroesophageal reflux disease) 07/14/2014  . COPD (chronic obstructive pulmonary disease) ([place]) 07/14/2014  . HTN (hypertension) 07/14/2014  . CAD (coronary artery disease) 07/14/2014  . End stage renal disease on dialysis ([place])   . Supraventricular tachycardia ([place])   . NSVT (nonsustained ventricular tachycardia) ([place])   . Pulmonary edema 07/03/2014  . Anemia 07/03/2014  . Pain in the chest   . Diarrhea   . NSTEMI (non-ST elevated myocardial infarction) ([place]) 06/21/2014    Past Surgical History  Procedure Laterality Date  . Dialysis fistula creation    . Tubal ligation    . Colonoscopy with propofol Left 07/09/2014    Procedure: COLONOSCOPY WITH PROPOFOL;  Surgeon: Kelsey Luster, MD;  Location: [facility] ENDOSCOPY;  Service: Endoscopy;  Laterality: Left;  . Colonoscopy N/A 07/15/2014    Procedure: COLONOSCOPY;  Surgeon: Kelsey Silvas, MD;  Location: [facility] ENDOSCOPY;  Service: Endoscopy;  Laterality: N/A;    Current Outpatient Rx  Name  Route  Sig  Dispense  Refill  . ADVAIR DISKUS 250-50 MCG/DOSE AEPB   Inhalation   Inhale 1 puff into the lungs 2 (two) times daily.           Dispense as written.   Marland Kitchen albuterol (PROVENTIL HFA;VENTOLIN HFA) 108 (90 BASE) MCG/ACT inhaler   Inhalation   Inhale 2 puffs into the lungs every 6 (six) hours as  needed for wheezing or shortness of breath.   1 Inhaler   2   . amiodarone (PACERONE) 200 MG tablet   Oral   Take 1 tablet (200 mg total) by mouth 2 (two) times daily.   60 tablet   1   . amLODipine (NORVASC) 5 MG tablet   Oral   Take 5 mg by mouth daily.         Marland Kitchen azithromycin (ZITHROMAX) 250 MG tablet      Take daily   5 each   0   . calcium acetate (PHOSLO) 667 MG capsule   Oral   Take 1 capsule (667 mg total) by mouth 3 (three) times daily with meals.   90 capsule   1   . calcium carbonate (TUMS - DOSED IN MG ELEMENTAL  CALCIUM) 500 MG chewable tablet   Oral   Chew 1 tablet by mouth daily.         . cinacalcet (SENSIPAR) 60 MG tablet   Oral   Take 60 mg by mouth daily. With largest meal         . clopidogrel (PLAVIX) 75 MG tablet   Oral   Take 75 mg by mouth daily.         . diazepam (VALIUM) 2 MG tablet   Oral   Take 1 tablet (2 mg total) by mouth every 8 (eight) hours as needed for muscle spasms.   15 tablet   0   . esomeprazole (NEXIUM) 40 MG capsule   Oral   Take 40 mg by mouth daily.          . ferrous sulfate 325 (65 FE) MG tablet   Oral   Take 325 mg by mouth daily.         . folic acid-vitamin b complex-vitamin c-selenium-zinc (DIALYVITE) 3 MG TABS tablet   Oral   Take 1 tablet by mouth daily.         . furosemide (LASIX) 80 MG tablet   Oral   Take 80 mg by mouth daily. Patient takes on non dialysis days.         . isosorbide mononitrate (IMDUR) 30 MG 24 hr tablet   Oral   Take 30 mg by mouth daily.         . Lidocaine-Prilocaine, Bulk, 2.5-2.5 % CREA   Other   1 application by Other route as needed (for tx). Apply to site 20-45 minutes before tx.         . metoprolol tartrate (LOPRESSOR) 25 MG tablet   Oral   Take 0.5 tablets (12.5 mg total) by mouth 2 (two) times daily.   30 tablet   1   . ondansetron (ZOFRAN) 4 MG tablet   Oral   Take 4 mg by mouth every 8 (eight) hours as needed. For nausea and vomiting         . oxyCODONE (OXY IR/ROXICODONE) 5 MG immediate release tablet   Oral   Take 1 tablet (5 mg total) by mouth every 4 (four) hours as needed for severe pain.   10 tablet   0   . predniSONE (DELTASONE) 10 MG tablet      3 tab daily for 2 days 2 tab daily for 2 days 1 tab daily for 2 days   12 tablet   0   . traMADol (ULTRAM) 50 MG tablet   Oral   Take 50 mg by mouth 2 (two) times daily as needed for moderate  pain.           Allergies Contrast media; Morphine and related; and Other  Family History  Problem Relation Age of  Onset  . Heart disease Mother   . Hypertension Mother     Social History Social History  Substance Use Topics  . Smoking status: Never Smoker   . Smokeless tobacco: Never Used  . Alcohol Use: No    Review of Systems Constitutional: She reports severe shaking chills today. Eyes: No visual changes. ENT: No sore throat. Cardiovascular: Denies chest pain. Respiratory: Reports chronic shortness of breath but also coughing more recently Gastrointestinal: No abdominal pain.  No nausea, no vomiting.  No diarrhea.  No constipation. Genitourinary: Negative for dysuria. Musculoskeletal: Having ongoing back pain for the last several months without significant change. Skin: Negative for rash. Neurological: Negative for headaches, focal weakness or numbness.  10-point ROS otherwise negative.  ____________________________________________   PHYSICAL EXAM:  VITAL SIGNS: ED Triage Vitals  Enc Vitals Group     BP 12/01/14 1100 171/103 mmHg     Pulse Rate 12/01/14 1105 123     Resp --      Temp 12/01/14 1105 99.2 F (37.3 C)     Temp Source 12/01/14 1105 Oral     SpO2 12/01/14 1105 100 %     Weight 12/01/14 1100 185 lb (83.915 kg)     Height 12/01/14 1100 5' (1.524 m)     Head Cir --      Peak Flow --      Pain Score --      Pain Loc --      Pain Edu? --      Excl. in [place]? --    Constitutional: Alert and oriented. Patient is slightly tachypnea, very hard of hearing but reads lips well. Eyes: Conjunctivae are normal. PERRL. EOMI. Head: Atraumatic. Nose: No congestion/rhinnorhea. Mouth/Throat: Mucous membranes are moist.  Oropharynx non-erythematous. Neck: No stridor.   Cardiovascular: Tachycardic rate, regular rhythm. Grossly normal heart sounds.  Good peripheral circulation. Respiratory: Mild increased work of breathing and some tachypnea respiratory effort.  No retractions. Lungs CTAB. Gastrointestinal: Soft and nontender. No distention. No abdominal bruits. No CVA  tenderness. Musculoskeletal: No lower extremity tenderness nor edema.  No joint effusions. Neurologic:  Normal speech and language. No gross focal neurologic deficits are appreciated. Skin:  Skin is warm, dry and intact. No rash noted. Psychiatric: Mood and affect are normal. Speech and behavior are normal.  ____________________________________________   LABS (all labs ordered are listed, but only abnormal results are displayed)  Labs Reviewed  COMPREHENSIVE METABOLIC PANEL - Abnormal; Notable for the following:    Potassium 3.2 (*)    Chloride 99 (*)    Glucose, Bld 131 (*)    Creatinine, Ser 2.75 (*)    Calcium 8.3 (*)    Albumin 3.0 (*)    Total Bilirubin 2.7 (*)    GFR calc non Af Amer 18 (*)    GFR calc Af Amer 20 (*)    All other components within normal limits  URINALYSIS COMPLETEWITH MICROSCOPIC ([facility] ONLY) - Abnormal; Notable for the following:    Color, Urine YELLOW (*)    APPearance TURBID (*)    Hgb urine dipstick 2+ (*)    Protein, ur >500 (*)    Leukocytes, UA 2+ (*)    Bacteria, UA RARE (*)    Squamous Epithelial / LPF TOO NUMEROUS TO COUNT (*)    All other components within normal  limits  TROPONIN I - Abnormal; Notable for the following:    Troponin I 0.62 (*)    All other components within normal limits  CBC - Abnormal; Notable for the following:    WBC 23.8 (*)    RBC 3.02 (*)    Hemoglobin 7.8 (*)    HCT 24.6 (*)    MCH 25.6 (*)    MCHC 31.5 (*)    RDW 25.5 (*)    All other components within normal limits  LACTIC ACID, PLASMA - Abnormal; Notable for the following:    Lactic Acid, Venous 4.7 (*)    All other components within normal limits  CULTURE, BLOOD (ROUTINE X 2)  CULTURE, BLOOD (ROUTINE X 2)  LIPASE, BLOOD  CBG MONITORING, ED  TYPE AND SCREEN   ____________________________________________  EKG  Reviewed and interpreted by me Heart rate 120 Sinus tachycardia, slight artifact is present in lateral leads but no obvious ischemic T-wave  changes are noted QTc 460 QRS 93 PR 176 Reviewed and interpreted as sinus tachycardia without obvious acute ischemic changes, some mild artifact present ____________________________________________  RADIOLOGY  DG Chest Port 1 View (Final result) Result time: 12/01/14 12:26:36   Final result by Rad Results In Interface (12/01/14 12:26:36)   Narrative:   CLINICAL DATA: Dialysis patient, now extreme shortness of breath and fever.  EXAM: PORTABLE CHEST 1 VIEW  COMPARISON: Chest CT dated 09/02/2014 and chest x-ray dated 09/01/2014.  FINDINGS: The cardiomegaly appears stable. Again noted is central pulmonary vascular congestion and bilateral interstitial edema consistent with chronic/recurrent congestive heart failure.  Dense opacity at the left lung base is slightly increased, consistent with a combination of consolidation and effusion seen on previous chest CT, presumably chronic. The right-sided dialysis catheter is stable in position. No pneumothorax. No acute osseous abnormality.  IMPRESSION: 1. Cardiomegaly with central pulmonary vascular congestion and bilateral interstitial edema, not significantly changed in appearance compared to the previous study dated 09/01/2014, consistent with chronic/recurrent congestive heart failure. 2. Dense opacity at the left lung base is stable, or slightly increased, compared to the previous chest x-ray of 09/01/2014. On interval chest CT, this was due to a combination of consolidation (atelectasis versus pneumonia) and pleural effusion.    ____________________________________________   PROCEDURES  Procedure(s) performed: None  Critical Care performed: No  ____________________________________________   INITIAL IMPRESSION / ASSESSMENT AND PLAN / ED COURSE  Pertinent labs & imaging results that were available during my care of the patient were reviewed by me and considered in my medical decision making (see chart for  details).  Patient presents with productive cough, shaking chills. She is notably a hemodialysis patient and has a dialysis catheter in the right upper chest. She has no obvious infectious symptoms aside from notable cough recently. I am concerned that she may have significant riders. Her EKG does not demonstrate any significant acute ischemic T-wave abnormality, but her first troponin resulted 0.60 which I suspect is likely demand ischemia. She denies any chest pain. In addition, labs are returning and indicate significant leukocytosis and she has down developed a fever to 102. In the setting of highly suspect patient is suffering from bacteremia or sepsis probably from pneumonia and possibly also urinary tract infection. Blood cultures of an ordered, we will admit the patient with broad-spectrum antibiotics initiated. I will gently provide fluid hydration, though I'm hesitant and will not wish to volume overload her given her need for hemodialysis.  Discussed with Dr. Bridgett Larsson, hospitalist service will admit the patient.  ____________________________________________   FINAL CLINICAL IMPRESSION(S) / ED DIAGNOSES  Final diagnoses:  UTI (lower urinary tract infection)  Sepsis, due to unspecified organism [facility])  Left lower lobe pneumonia      Delman Kitten, MD 12/01/14 1324

## 2014-12-01 NOTE — ED Notes (Signed)
Critical lab result given to [place], [place]

## 2014-12-01 NOTE — H&P (Signed)
[facility] [place] NAME: Kelsey Peterson    MR#:  [id number]  DATE OF BIRTH:  February 05, 1955  DATE OF ADMISSION:  12/01/2014  PRIMARY CARE PHYSICIAN: Lavera Guise, MD   REQUESTING/REFERRING PHYSICIAN: Dr. Jacqualine Code.  CHIEF COMPLAINT:   Chief Complaint  Patient presents with  . Shortness of Breath  . Rectal Bleeding   shortness of breath and fever during dialysis today.  HISTORY OF PRESENT ILLNESS:  Kelsey Peterson  is a 59 y.o. female with a known history of hypertension, diabetes, CAD, ESRD and COPD. The patient was sent from dialysis center due to shortness of breath and fever today. She is very hard of hearing and poor historian. Per ED physician, she completed hemodialysis today. The patient complains of cough and shortness of breath but denies any chest pain, palpitation, orthopnea, nocturnal dyspnea or leg edema. The patient denies any bloody stool but had melena last week. The patient has fever 103. Chest x-ray show left sided consolidation. WBC 24,000. She was treated with vancomycin and Zosyn in the ED.  PAST MEDICAL HISTORY:   Past Medical History  Diagnosis Date  . COPD (chronic obstructive pulmonary disease) ([place])   . ESRD on hemodialysis ([place])   . DM2 (diabetes mellitus, type 2) ([place])   . Hepatitis C   . Migraine with aura   . HOH (hard of hearing)   . Mitral regurgitation     a. echo 06/2014: EF 60%, no WMA, GR1DD, mild AI, calcified mitral annulus, mod MR, no atrial septal defect or patent foramen ovale   . Cognitive impairment   . CAD (coronary artery disease)   . HTN (hypertension)     PAST SURGICAL HISTORY:   Past Surgical History  Procedure Laterality Date  . Dialysis fistula creation    . Tubal ligation    . Colonoscopy with propofol Left 07/09/2014    Procedure: COLONOSCOPY WITH PROPOFOL;  Surgeon: Hulen Luster, MD;  Location: [facility] ENDOSCOPY;  Service: Endoscopy;  Laterality: Left;  . Colonoscopy N/A  07/15/2014    Procedure: COLONOSCOPY;  Surgeon: Manya Silvas, MD;  Location: [facility] ENDOSCOPY;  Service: Endoscopy;  Laterality: N/A;    SOCIAL HISTORY:   Social History  Substance Use Topics  . Smoking status: Never Smoker   . Smokeless tobacco: Never Used  . Alcohol Use: No    FAMILY HISTORY:   Family History  Problem Relation Age of Onset  . Heart disease Mother   . Hypertension Mother     DRUG ALLERGIES:   Allergies  Allergen Reactions  . Contrast Media [Iodinated Diagnostic Agents]   . Morphine And Related Hives  . Other Rash    Blood Pressure Pill (Pt doesn't remember name)     REVIEW OF SYSTEMS:  CONSTITUTIONAL: Has fever, chills and generalized weakness.  EYES: No blurred or double vision.  EARS, NOSE, AND THROAT: No tinnitus or ear pain.  RESPIRATORY: Has cough, shortness of breath, no wheezing or hemoptysis.  CARDIOVASCULAR: No chest pain, orthopnea, edema.  GASTROINTESTINAL: No nausea, vomiting, diarrhea or abdominal pain.  GENITOURINARY: No dysuria, hematuria.  ENDOCRINE: No polyuria, nocturia,  HEMATOLOGY: No anemia, easy bruising or bleeding SKIN: No rash or lesion. MUSCULOSKELETAL: No joint pain or arthritis.   NEUROLOGIC: No tingling, numbness, weakness.  PSYCHIATRY: No anxiety or depression.   MEDICATIONS AT HOME:   Prior to Admission medications   Medication Sig Start Date End Date Taking? Authorizing Provider  ADVAIR DISKUS 250-50  MCG/DOSE AEPB Inhale 1 puff into the lungs 2 (two) times daily. 08/16/14   Historical Provider, MD  albuterol (PROVENTIL HFA;VENTOLIN HFA) 108 (90 BASE) MCG/ACT inhaler Inhale 2 puffs into the lungs every 6 (six) hours as needed for wheezing or shortness of breath. 09/06/14   [name], MD  amiodarone (PACERONE) 200 MG tablet Take 1 tablet (200 mg total) by mouth 2 (two) times daily. 07/10/14   Gladstone Lighter, MD  amLODipine (NORVASC) 5 MG tablet Take 5 mg by mouth daily.    Historical Provider, MD   azithromycin (ZITHROMAX) 250 MG tablet Take daily 09/06/14   [name], MD  calcium acetate (PHOSLO) 667 MG capsule Take 1 capsule (667 mg total) by mouth 3 (three) times daily with meals. 07/10/14   Gladstone Lighter, MD  calcium carbonate (TUMS - DOSED IN MG ELEMENTAL CALCIUM) 500 MG chewable tablet Chew 1 tablet by mouth daily.    Historical Provider, MD  cinacalcet (SENSIPAR) 60 MG tablet Take 60 mg by mouth daily. With largest meal    Historical Provider, MD  clopidogrel (PLAVIX) 75 MG tablet Take 75 mg by mouth daily. 08/31/14   Historical Provider, MD  diazepam (VALIUM) 2 MG tablet Take 1 tablet (2 mg total) by mouth every 8 (eight) hours as needed for muscle spasms. 11/02/14 11/02/15  Ahmed Prima, MD  esomeprazole (NEXIUM) 40 MG capsule Take 40 mg by mouth daily.     Historical Provider, MD  ferrous sulfate 325 (65 FE) MG tablet Take 325 mg by mouth daily.    Historical Provider, MD  folic acid-vitamin b complex-vitamin c-selenium-zinc (DIALYVITE) 3 MG TABS tablet Take 1 tablet by mouth daily.    Historical Provider, MD  furosemide (LASIX) 80 MG tablet Take 80 mg by mouth daily. Patient takes on non dialysis days.    Historical Provider, MD  isosorbide mononitrate (IMDUR) 30 MG 24 hr tablet Take 30 mg by mouth daily.    Historical Provider, MD  Lidocaine-Prilocaine, Bulk, A999333 % CREA 1 application by Other route as needed (for tx). Apply to site 20-45 minutes before tx.    Historical Provider, MD  metoprolol tartrate (LOPRESSOR) 25 MG tablet Take 0.5 tablets (12.5 mg total) by mouth 2 (two) times daily. 07/10/14   Gladstone Lighter, MD  ondansetron (ZOFRAN) 4 MG tablet Take 4 mg by mouth every 8 (eight) hours as needed. For nausea and vomiting 08/28/14   Historical Provider, MD  oxyCODONE (OXY IR/ROXICODONE) 5 MG immediate release tablet Take 1 tablet (5 mg total) by mouth every 4 (four) hours as needed for severe pain. 11/02/14   Ahmed Prima, MD  predniSONE (DELTASONE) 10  MG tablet 3 tab daily for 2 days 2 tab daily for 2 days 1 tab daily for 2 days 09/06/14   [name], MD  traMADol (ULTRAM) 50 MG tablet Take 50 mg by mouth 2 (two) times daily as needed for moderate pain.    Historical Provider, MD      VITAL SIGNS:  Blood pressure 137/78, pulse 118, temperature 103.1 F (39.5 C), temperature source Oral, height 5' (1.524 m), weight 83.915 kg (185 lb), SpO2 95 %.  PHYSICAL EXAMINATION:  GENERAL:  59 y.o.-year-old patient lying in the bed with mild distress.  EYES: Pupils equal, round, reactive to light and accommodation. No scleral icterus. Extraocular muscles intact.  HEENT: Head atraumatic, normocephalic. Oropharynx and nasopharynx clear. Nasal mucosa. Heart hearing. NECK:  Supple, no jugular venous distention. No thyroid enlargement, no tenderness.  LUNGS:  Normal breath sounds bilaterally, no wheezing, has crackles on the left base. No use of accessory muscles of respiration but has shallow breathing.  CARDIOVASCULAR: S1, S2 normal. No murmurs, rubs, or gallops.  ABDOMEN: Soft, nontender, nondistended. Bowel sounds present. No organomegaly or mass.  EXTREMITIES: No pedal edema, cyanosis, or clubbing.  NEUROLOGIC: Cranial nerves II through XII are intact. Muscle strength 3/5 in all extremities. Sensation intact. Gait not checked.  PSYCHIATRIC: The patient is alert and oriented x 2.  SKIN: No obvious rash, lesion, or ulcer.   LABORATORY PANEL:   CBC  Recent Labs Lab 12/01/14 1201  WBC 23.8*  HGB 7.8*  HCT 24.6*  PLT 281   ------------------------------------------------------------------------------------------------------------------  Chemistries   Recent Labs Lab 12/01/14 1132  NA 136  K 3.2*  CL 99*  CO2 27  GLUCOSE 131*  BUN 7  CREATININE 2.75*  CALCIUM 8.3*  AST 34  ALT 19  ALKPHOS 76  BILITOT 2.7*    ------------------------------------------------------------------------------------------------------------------  Cardiac Enzymes  Recent Labs Lab 12/01/14 1132  TROPONINI 0.62*   ------------------------------------------------------------------------------------------------------------------  RADIOLOGY:  Dg Chest Port 1 View  12/01/2014  CLINICAL DATA:  Dialysis patient, now extreme shortness of breath and fever. EXAM: PORTABLE CHEST 1 VIEW COMPARISON:  Chest CT dated 09/02/2014 and chest x-ray dated 09/01/2014. FINDINGS: The cardiomegaly appears stable. Again noted is central pulmonary vascular congestion and bilateral interstitial edema consistent with chronic/recurrent congestive heart failure. Dense opacity at the left lung base is slightly increased, consistent with a combination of consolidation and effusion seen on previous chest CT, presumably chronic. The right-sided dialysis catheter is stable in position. No pneumothorax. No acute osseous abnormality. IMPRESSION: 1. Cardiomegaly with central pulmonary vascular congestion and bilateral interstitial edema, not significantly changed in appearance compared to the previous study dated 09/01/2014, consistent with chronic/recurrent congestive heart failure. 2. Dense opacity at the left lung base is stable, or slightly increased, compared to the previous chest x-ray of 09/01/2014. On interval chest CT, this was due to a combination of consolidation (atelectasis versus pneumonia) and pleural effusion. Electronically Signed   By: Franki Cabot M.D.   On: 12/01/2014 12:26    EKG:   Orders placed or performed during the hospital encounter of 12/01/14  . EKG 12-Lead  . EKG 12-Lead  . ED EKG  . ED EKG    IMPRESSION AND PLAN:   Sepsis Left sided pneumonia (HAP) UTI Lactic Acidosis Elevated troponin, due to demanding ischemia Hypokalemia Elevated bilirubin Melena ESRD on hemodialysis Hypertension DM2 CAD COPD Anemia of  chronic disease  The patient will be admitted to medical floor. She was treated with vancomycin and Zosyn in the ED. I will continue up above antibiotics, follow-up CBC, urine culture, sputum culture and blood culture. Continue oxygen by nasal cannula and Xopenex. For melena, get stool occult test and GI consult. Follow-up hemoglobin. Hold heparin subcutaneous for DVT prophylaxis and start SCD. Follow-up CMP and lactic acid. Review hypertension medication and start hydralazine IV when necessary. Nephrology consult to continue hemodialysis. For diabetes, start sliding scale.  All the records are reviewed and case discussed with ED provider. Management plans discussed with the patient, family and they are in agreement. I called patient's mother she is not patient's POA. I called patient's son who is POA, but nobody answered the phone [phone]).  CODE STATUS: Full code  TOTAL TIME TAKING CARE OF THIS PATIENT:  58 minutes.    Demetrios Loll M.D on 12/01/2014 at 1:23 PM  Between 7am to 6pm -  Pager - [phone]  After 6pm go to [email or web address] - password [email or web address] [place] [facility]  Office  [phone]  CC: Primary care physician; Lavera Guise, MD

## 2014-12-01 NOTE — Progress Notes (Signed)
Abdominal ultrasound shows cholelithiasis without cholecystitis. I will get surgical consult.

## 2014-12-01 NOTE — Progress Notes (Addendum)
Report received from ED RN DeniatP. admitted to 2A, rm 244. Oriented to room, call bell, As[employer] phones and staff. Bed in low position. Fall safety plan reviewed, contract signed and placed on wall, yellow non-skid socks in place, bed alarm on. Full assessment to Epic. Will continue to monitor. Skin assessed with Larence Penning RN. Pt very HOH and Network engineer & tele clerk made aware.  Pt has h/o MRSA (+) PCR over 1 month ago; will be re-swabbed and placed on contact until results.

## 2014-12-01 NOTE — Progress Notes (Signed)
Patient refusing blood sugar check at this time.

## 2014-12-01 NOTE — Progress Notes (Signed)
ANTIBIOTIC CONSULT NOTE - INITIAL  Pharmacy Consult for Vancomycin and Zosyn Indication: pneumonia  Allergies  Allergen Reactions  . Contrast Media [Iodinated Diagnostic Agents] Other (See Comments)    Unknown reaction  . Morphine And Related Hives  . Other Rash    Blood Pressure Pill (Pt doesn't remember name)     Patient Measurements: Height: 5' 2.5" (158.8 cm) (stated) Weight: 174 lb 14.4 oz (79.334 kg) (admission) IBW/kg (Calculated) : 51.25 Adjusted Body Weight: 63.2  Vital Signs: Temp: 98.3 F (36.8 C) (11/12 1500) Temp Source: Oral (11/12 1500) BP: 146/87 mmHg (11/12 1500) Pulse Rate: 98 (11/12 1500) Intake/Output from previous day:   Intake/Output from this shift:    Labs:  Recent Labs  12/01/14 1132 12/01/14 1201  WBC  --  23.8*  HGB  --  7.8*  PLT  --  281  CREATININE 2.75*  --    Estimated Creatinine Clearance: 21.5 mL/min (by C-G formula based on Cr of 2.75). No results for input(s): VANCOTROUGH, VANCOPEAK, VANCORANDOM, GENTTROUGH, GENTPEAK, GENTRANDOM, TOBRATROUGH, TOBRAPEAK, TOBRARND, AMIKACINPEAK, AMIKACINTROU, AMIKACIN in the last 72 hours.   Microbiology: No results found for this or any previous visit (from the past 720 hour(s)).  Medical History: Past Medical History  Diagnosis Date  . COPD (chronic obstructive pulmonary disease) ([place])   . ESRD on hemodialysis ([place])   . DM2 (diabetes mellitus, type 2) ([place])   . Hepatitis C   . Migraine with aura   . HOH (hard of hearing)   . Mitral regurgitation     a. echo 06/2014: EF 60%, no WMA, GR1DD, mild AI, calcified mitral annulus, mod MR, no atrial septal defect or patent foramen ovale   . Cognitive impairment   . CAD (coronary artery disease)   . HTN (hypertension)     Medications:  Prescriptions prior to admission  Medication Sig Dispense Refill Last Dose  . albuterol (PROVENTIL HFA;VENTOLIN HFA) 108 (90 BASE) MCG/ACT inhaler Inhale 2 puffs into the lungs every 6 (six) hours as needed for  wheezing or shortness of breath. (Patient taking differently: Inhale 2 puffs into the lungs every 4 (four) hours as needed for wheezing or shortness of breath. ) 1 Inhaler 2   . amLODipine (NORVASC) 5 MG tablet Take 5 mg by mouth daily.   unknown  . clopidogrel (PLAVIX) 75 MG tablet Take 75 mg by mouth daily.   unknown  . esomeprazole (NEXIUM) 40 MG capsule Take 40 mg by mouth daily.    unknown  . ferrous sulfate 325 (65 FE) MG tablet Take 325 mg by mouth daily.   unknown  . folic acid-vitamin b complex-vitamin c-selenium-zinc (DIALYVITE) 3 MG TABS tablet Take 1 tablet by mouth daily.   unknown  . furosemide (LASIX) 80 MG tablet Take 80 mg by mouth daily. Patient takes on non dialysis days.   unknown  . isosorbide mononitrate (IMDUR) 30 MG 24 hr tablet Take 30 mg by mouth daily.   unknown  . Lidocaine-Prilocaine, Bulk, A999333 % CREA 1 application by Other route as needed (for tx). Apply to site 20-45 minutes before tx.   unknown  . amiodarone (PACERONE) 200 MG tablet Take 1 tablet (200 mg total) by mouth 2 (two) times daily. (Patient not taking: Reported on 12/01/2014) 60 tablet 1 Not Taking at Unknown time  . azithromycin (ZITHROMAX) 250 MG tablet Take daily 5 each 0   . calcium acetate (PHOSLO) 667 MG capsule Take 1 capsule (667 mg total) by mouth 3 (three) times daily  with meals. 90 capsule 1 unknown  . calcium carbonate (TUMS - DOSED IN MG ELEMENTAL CALCIUM) 500 MG chewable tablet Chew 1 tablet by mouth 2 (two) times daily after a meal. Take 2 hours after meal.   unknown  . cinacalcet (SENSIPAR) 60 MG tablet Take 60 mg by mouth daily. With largest meal   unknown  . diazepam (VALIUM) 2 MG tablet Take 1 tablet (2 mg total) by mouth every 8 (eight) hours as needed for muscle spasms. 15 tablet 0   . metoprolol tartrate (LOPRESSOR) 25 MG tablet Take 0.5 tablets (12.5 mg total) by mouth 2 (two) times daily. 30 tablet 1 09/01/2014 at 1000  . oxyCODONE (OXY IR/ROXICODONE) 5 MG immediate release tablet  Take 1 tablet (5 mg total) by mouth every 4 (four) hours as needed for severe pain. 10 tablet 0   . predniSONE (DELTASONE) 10 MG tablet 3 tab daily for 2 days 2 tab daily for 2 days 1 tab daily for 2 days 12 tablet 0   . traMADol (ULTRAM) 50 MG tablet Take 50 mg by mouth 2 (two) times daily as needed for moderate pain.   unknown   Scheduled:  . amiodarone  200 mg Oral BID  . amLODipine  5 mg Oral Daily  . calcium carbonate  1 tablet Oral BID PC  . cinacalcet  60 mg Oral Daily  . docusate sodium  100 mg Oral BID  . ferrous sulfate  325 mg Oral Daily  . folic acid-vitamin b complex-vitamin c-selenium-zinc  1 tablet Oral Daily  . furosemide  80 mg Oral Daily  . insulin aspart  0-5 Units Subcutaneous QHS  . insulin aspart  0-9 Units Subcutaneous TID WC  . isosorbide mononitrate  30 mg Oral Daily  . metoprolol tartrate  12.5 mg Oral BID  . mometasone-formoterol  2 puff Inhalation BID  . pantoprazole  40 mg Oral Daily  . piperacillin-tazobactam (ZOSYN)  IV  3.375 g Intravenous Q12H  . sodium chloride  3 mL Intravenous Q12H  . vancomycin  500 mg Intravenous Once   Assessment: Patient with ESRD requiring hemodialysis per MD Bridgett Larsson H&P admitted for pneumonia.   Goal of Therapy:  Vancomycin trough 15-25 mcg/ml  Plan:  Follow up culture results   Will give an additional 500 mg to = 1500 mg loading dose per protocol. Will then order Vancomycin 750 mg with each HD session. Will follow up in am to find out patient's hemodialysis schedule.  Will start Zosyn 3.375 g IV q12 hours   Kelsey Peterson D 12/01/2014,3:42 PM

## 2014-12-01 NOTE — ED Notes (Signed)
Quale,MD informed of critical lab result. Nurse to use Left ac ultrasound IV painful to pt when flushed with swelling. Site discontinued.

## 2014-12-01 NOTE — Progress Notes (Signed)
Abd [place] results came back and show cholelithiasis and cholecystitis. Dr. Bridgett Larsson paged and made aware. No further orders, pt reports no abdominal pain; will continue to monitor.

## 2014-12-02 LAB — COMPREHENSIVE METABOLIC PANEL
ALBUMIN: 2.6 g/dL — AB (ref 3.5–5.0)
ALT: 16 U/L (ref 14–54)
ANION GAP: 7 (ref 5–15)
AST: 24 U/L (ref 15–41)
Alkaline Phosphatase: 56 U/L (ref 38–126)
BILIRUBIN TOTAL: 2.3 mg/dL — AB (ref 0.3–1.2)
BUN: 12 mg/dL (ref 6–20)
CHLORIDE: 101 mmol/L (ref 101–111)
CO2: 26 mmol/L (ref 22–32)
Calcium: 8.5 mg/dL — ABNORMAL LOW (ref 8.9–10.3)
Creatinine, Ser: 3.76 mg/dL — ABNORMAL HIGH (ref 0.44–1.00)
GFR calc Af Amer: 14 mL/min — ABNORMAL LOW (ref >60)
GFR, EST NON AFRICAN AMERICAN: 12 mL/min — AB (ref >60)
Glucose, Bld: 95 mg/dL (ref 65–99)
POTASSIUM: 3.1 mmol/L — AB (ref 3.5–5.1)
Sodium: 134 mmol/L — ABNORMAL LOW (ref 135–145)
TOTAL PROTEIN: 6.1 g/dL — AB (ref 6.5–8.1)

## 2014-12-02 LAB — CBC
HEMATOCRIT: 21.3 % — AB (ref 35.0–47.0)
Hemoglobin: 6.4 g/dL — ABNORMAL LOW (ref 12.0–16.0)
MCH: 25.5 pg — ABNORMAL LOW (ref 26.0–34.0)
MCHC: 30.3 g/dL — AB (ref 32.0–36.0)
MCV: 84.4 fL (ref 80.0–100.0)
PLATELETS: 168 10*3/uL (ref 150–440)
RBC: 2.52 MIL/uL — ABNORMAL LOW (ref 3.80–5.20)
RDW: 26 % — AB (ref 11.5–14.5)
WBC: 14.9 10*3/uL — ABNORMAL HIGH (ref 3.6–11.0)

## 2014-12-02 LAB — HEMOGLOBIN AND HEMATOCRIT, BLOOD
HEMATOCRIT: 26.8 % — AB (ref 35.0–47.0)
HEMOGLOBIN: 8.4 g/dL — AB (ref 12.0–16.0)

## 2014-12-02 LAB — OCCULT BLOOD X 1 CARD TO LAB, STOOL: FECAL OCCULT BLD: NEGATIVE

## 2014-12-02 LAB — PREPARE RBC (CROSSMATCH)

## 2014-12-02 MED ORDER — MUPIROCIN 2 % EX OINT
1.0000 "application " | TOPICAL_OINTMENT | Freq: Two times a day (BID) | CUTANEOUS | Status: AC
  Administered 2014-12-02 – 2014-12-06 (×6): 1 via NASAL
  Filled 2014-12-02 (×2): qty 22

## 2014-12-02 MED ORDER — VANCOMYCIN HCL IN DEXTROSE 750-5 MG/150ML-% IV SOLN
750.0000 mg | INTRAVENOUS | Status: DC
  Filled 2014-12-02 (×2): qty 150

## 2014-12-02 MED ORDER — SODIUM CHLORIDE 0.9 % IV SOLN: Freq: Once | INTRAVENOUS | Status: DC

## 2014-12-02 MED ORDER — CHLORHEXIDINE GLUCONATE CLOTH 2 % EX PADS
6.0000 | MEDICATED_PAD | Freq: Every day | CUTANEOUS | Status: AC
  Administered 2014-12-03 – 2014-12-04 (×2): 6 via TOPICAL

## 2014-12-02 NOTE — Progress Notes (Signed)
Spoke with Dr. Holley Raring regarding potassium level of 3.1 - no interventions at this time since patient just had dialysis yesterday.

## 2014-12-02 NOTE — Progress Notes (Addendum)
[facility] [place] NAME: Kelsey Peterson    MR#:  [id number]  DATE OF BIRTH:  30-Sep-1955  SUBJECTIVE:   Patient here due to sepsis. Patient presented with fever and shortness of breath and chest x-ray findings suggestive of pneumonia. Patient now also noted to be bacteremic. Afebrile this morning and hemodynamically stable. No abdominal pain, nausea, vomiting. Noted to be anemic this morning.  REVIEW OF SYSTEMS:    Review of Systems  Constitutional: Negative for fever and chills.  HENT: Negative for congestion and tinnitus.   Eyes: Negative for blurred vision and double vision.  Respiratory: Negative for cough, shortness of breath and wheezing.   Cardiovascular: Negative for chest pain, orthopnea and PND.  Gastrointestinal: Negative for nausea, vomiting, abdominal pain and diarrhea.  Genitourinary: Negative for dysuria and hematuria.  Neurological: Negative for dizziness, sensory change and focal weakness.  All other systems reviewed and are negative.   Nutrition: Renal Tolerating Diet: Yes Tolerating PT: Await evaluation   DRUG ALLERGIES:   Allergies  Allergen Reactions  . Contrast Media [Iodinated Diagnostic Agents] Other (See Comments)    Unknown reaction  . Morphine And Related Hives  . Other Rash    Blood Pressure Pill (Pt doesn't remember name)     VITALS:  Blood pressure 128/79, pulse 116, temperature 97.7 F (36.5 C), temperature source Oral, resp. rate 20, height 5' 2.5" (1.588 m), weight 80.241 kg (176 lb 14.4 oz), SpO2 100 %.  PHYSICAL EXAMINATION:   Physical Exam  GENERAL:  59 y.o.-year-old obese patient lying in the bed with no acute distress.  EYES: Pupils equal, round, reactive to light and accommodation. No scleral icterus. Extraocular muscles intact.  HEENT: Head atraumatic, normocephalic. Oropharynx and nasopharynx clear.  NECK:  Supple, no jugular venous distention. No thyroid enlargement, no  tenderness.  LUNGS: Normal breath sounds bilaterally, no wheezing, rales, rhonchi. No use of accessory muscles of respiration.  CARDIOVASCULAR: S1, S2 normal. No murmurs, rubs, or gallops.  ABDOMEN: Soft, nontender, nondistended. Bowel sounds present. No organomegaly or mass.  EXTREMITIES: No cyanosis, clubbing or edema b/l.    NEUROLOGIC: Cranial nerves II through XII are intact. No focal Motor or sensory deficits b/l. Globally weak  PSYCHIATRIC: The patient is alert and oriented x 2. Agitated at times.  SKIN: No obvious rash, lesion, or ulcer.   Right upper extremity AV fistula with no bruit or thrill. Right chest dialysis catheter in place.   LABORATORY PANEL:   CBC  Recent Labs Lab 12/02/14 0523  WBC 14.9*  HGB 6.4*  HCT 21.3*  PLT 168   ------------------------------------------------------------------------------------------------------------------  Chemistries   Recent Labs Lab 12/02/14 0523  NA 134*  K 3.1*  CL 101  CO2 26  GLUCOSE 95  BUN 12  CREATININE 3.76*  CALCIUM 8.5*  AST 24  ALT 16  ALKPHOS 56  BILITOT 2.3*   ------------------------------------------------------------------------------------------------------------------  Cardiac Enzymes  Recent Labs Lab 12/01/14 1132  TROPONINI 0.62*   ------------------------------------------------------------------------------------------------------------------  RADIOLOGY:  Dg Chest Port 1 View  12/01/2014  CLINICAL DATA:  Dialysis patient, now extreme shortness of breath and fever. EXAM: PORTABLE CHEST 1 VIEW COMPARISON:  Chest CT dated 09/02/2014 and chest x-ray dated 09/01/2014. FINDINGS: The cardiomegaly appears stable. Again noted is central pulmonary vascular congestion and bilateral interstitial edema consistent with chronic/recurrent congestive heart failure. Dense opacity at the left lung base is slightly increased, consistent with a combination of consolidation and effusion seen on previous  chest CT,  presumably chronic. The right-sided dialysis catheter is stable in position. No pneumothorax. No acute osseous abnormality. IMPRESSION: 1. Cardiomegaly with central pulmonary vascular congestion and bilateral interstitial edema, not significantly changed in appearance compared to the previous study dated 09/01/2014, consistent with chronic/recurrent congestive heart failure. 2. Dense opacity at the left lung base is stable, or slightly increased, compared to the previous chest x-ray of 09/01/2014. On interval chest CT, this was due to a combination of consolidation (atelectasis versus pneumonia) and pleural effusion. Electronically Signed   By: Franki Cabot M.D.   On: 12/01/2014 12:26   US Abdomen Limited Ruq  12/01/2014  CLINICAL DATA:  Elevated bilirubin.  Known gallstones. EXAM: US ABDOMEN LIMITED - RIGHT UPPER QUADRANT COMPARISON:  CT 01/18/2014 FINDINGS: Gallbladder: Moderate cholelithiasis with larger stone measuring 2.3 cm. No significant wall thickening as the wall measures 2.4 mm. Negative sonographic Murphy sign. No adjacent free fluid. Common bile duct: Diameter: 5 mm. Liver: No focal lesion identified. Within normal limits in parenchymal echogenicity. Incidental finding of significant right renal cortical thinning and increased echogenicity compatible with medical renal disease. IMPRESSION: Moderate cholelithiasis with the larger stone measuring 2.3 cm. No additional findings to suggest cholecystitis. Right renal findings suggesting medical renal disease. Electronically Signed   By: Marin Olp M.D.   On: 12/01/2014 18:03     ASSESSMENT AND PLAN:   59 year old female with past medical history of end-stage renal disease on hemodialysis, COPD, type 2 diabetes, hepatitis C, coronary artery disease, cognitive impairment, who presented to the hospital due to fever and shortness of breath from dialysis.  #1 sepsis-patient meets criteria given her leukocytosis, fever, chest x-ray  findings suggestive of pneumonia. -Continue IV vancomycin, Zosyn. Blood cultures are positive for gram-positive cocci but identification is pending. -Continue supportive care, follow hemodynamics.  #2 pneumonia-likely source of patient's sepsis. Continue IV vancomycin, Zosyn. Await identification of the blood cultures.  #3 cholelithiasis-patient had abdominal ultrasound which showed gallstones but no evidence of acute cholecystitis. -Appreciate surgical input and they recommended getting a gastroenterology consult. I do not suspect that the patient has cholangitis presently as patient's LFTs are normal and her bilirubins only marginally elevated. We'll follow LFTs and follow her clinically for now.  #4 end-stage renal disease on hemodialysis-nephrology has been consulted. -Continue dialysis on Tuesday Thursday Saturday schedule as per nephrology.  #5 secondary hyperparathyroidism-continue Sensipar  #6 hypertension-continue metoprolol, Imdur, Norvasc.  #7 COPD-no acute exacerbation. Continue Dulera, when necessary DuoNeb's.  #8 GERD-continue Protonix.  #9 type 2 diabetes-continue sliding scale insulin.  #10 anemia of chronic disease-likely secondary to end-stage renal disease. -Hemoglobin down to 6.4 this a.m. and we'll transfuse one unit and follow hemoglobin.   All the records are reviewed and case discussed with Care Management/Social Workerr. Management plans discussed with the patient, family and they are in agreement.  CODE STATUS: Full  DVT Prophylaxis: Teds and SCDs  TOTAL TIME TAKING CARE OF THIS PATIENT: 30 minutes.   POSSIBLE D/C IN 2-3 DAYS, DEPENDING ON CLINICAL CONDITION.   Henreitta Leber M.D on 12/02/2014 at 12:41 PM  Between 7am to 6pm - Pager - [phone]  After 6pm go to [email or web address] - password [email or web address] [facility]  Office  [phone]  CC: Primary care physician; Lavera Guise, MD

## 2014-12-02 NOTE — Progress Notes (Signed)
Per Dr. Verdell Carmine, can pause zosyn for blood transfusion then resume when blood completed.

## 2014-12-02 NOTE — Consult Note (Signed)
CC: Hyperbilirubinemia, fevers, leukocytosis  HPI: Kelsey Peterson is a pleasant 59 yo F with multiple medical issues including DM, ESRD, CAD and COPD who was admitted yesterday for SOB and fever.  Was noted to have hyperbilirubinemia to 2.7, currently 2.3 and gallstones on U/S.  CBD 5 mm.  Currently without pain and says that she has not had pain.  Temp to 103 yesterday.  + GPC on blood cultures.  CXR shows probable left sided pneumonia.  No current chills, chest pain, shortness of breath, cough, nausea/vomiting, diarrhea/constipation.  Active Ambulatory Problems    Diagnosis Date Noted  . NSTEMI (non-ST elevated myocardial infarction) ([place]) 06/21/2014  . Pain in the chest   . Diarrhea   . Pulmonary edema 07/03/2014  . Anemia 07/03/2014  . End stage renal disease on dialysis ([place])   . Supraventricular tachycardia ([place])   . NSVT (nonsustained ventricular tachycardia) ([place])   . GI bleed 07/14/2014  . GERD (gastroesophageal reflux disease) 07/14/2014  . COPD (chronic obstructive pulmonary disease) ([place]) 07/14/2014  . HTN (hypertension) 07/14/2014  . CAD (coronary artery disease) 07/14/2014  . Hyperkalemia 09/01/2014  . Fluid overload 09/01/2014  . Malignant hypertension 09/01/2014   Resolved Ambulatory Problems    Diagnosis Date Noted  . No Resolved Ambulatory Problems   Past Medical History  Diagnosis Date  . ESRD on hemodialysis ([place])   . DM2 (diabetes mellitus, type 2) ([place])   . Hepatitis C   . Migraine with aura   . HOH (hard of hearing)   . Mitral regurgitation   . Cognitive impairment    Past Surgical History  Procedure Laterality Date  . Dialysis fistula creation    . Tubal ligation    . Colonoscopy with propofol Left 07/09/2014    Procedure: COLONOSCOPY WITH PROPOFOL;  Surgeon: Hulen Luster, MD;  Location: [facility] ENDOSCOPY;  Service: Endoscopy;  Laterality: Left;  . Colonoscopy N/A 07/15/2014    Procedure: COLONOSCOPY;  Surgeon: Manya Silvas, MD;  Location: [facility] ENDOSCOPY;   Service: Endoscopy;  Laterality: N/A;     Medication List    ASK your doctor about these medications        albuterol (2.5 MG/3ML) 0.083% nebulizer solution  Commonly known as:  PROVENTIL  Take 2.5 mg by nebulization every 4 (four) hours.     albuterol 108 (90 BASE) MCG/ACT inhaler  Commonly known as:  PROVENTIL HFA;VENTOLIN HFA  Inhale 2 puffs into the lungs every 6 (six) hours as needed for wheezing or shortness of breath.     amLODipine 5 MG tablet  Commonly known as:  NORVASC  Take 5 mg by mouth daily.     calcium carbonate 500 MG chewable tablet  Commonly known as:  TUMS - dosed in mg elemental calcium  Chew 1 tablet by mouth 2 (two) times daily after a meal. Take 2 hours after meal.     clopidogrel 75 MG tablet  Commonly known as:  PLAVIX  Take 75 mg by mouth daily.     diazepam 2 MG tablet  Commonly known as:  VALIUM  Take 1 tablet (2 mg total) by mouth every 8 (eight) hours as needed for muscle spasms.     esomeprazole 40 MG capsule  Commonly known as:  NEXIUM  Take 40 mg by mouth daily.     ferrous sulfate 325 (65 FE) MG tablet  Take 325 mg by mouth daily.     folic acid-vitamin b complex-vitamin c-selenium-zinc 3 MG Tabs tablet  Take 1  tablet by mouth daily.     furosemide 80 MG tablet  Commonly known as:  LASIX  Take 80 mg by mouth daily. Patient takes on non dialysis days.     isosorbide mononitrate 30 MG 24 hr tablet  Commonly known as:  IMDUR  Take 30 mg by mouth daily.     Lidocaine-Prilocaine (Bulk) [id number] % Crea  1 application by Other route as needed (for tx). Apply to site 20-45 minutes before tx.     oxyCODONE 5 MG immediate release tablet  Commonly known as:  Oxy IR/ROXICODONE  Take 1 tablet (5 mg total) by mouth every 4 (four) hours as needed for severe pain.     predniSONE 10 MG tablet  Commonly known as:  DELTASONE  3 tab daily for 2 days 2 tab daily for 2 days 1 tab daily for 2 days     sevelamer carbonate 800 MG tablet  Commonly  known as:  RENVELA  Take 800 mg by mouth 3 (three) times daily with meals.     traMADol 50 MG tablet  Commonly known as:  ULTRAM  Take 50 mg by mouth 2 (two) times daily as needed for moderate pain.       Allergies  Allergen Reactions  . Contrast Media [Iodinated Diagnostic Agents] Other (See Comments)    Unknown reaction  . Morphine And Related Hives  . Other Rash    Blood Pressure Pill (Pt doesn't remember name)    Family History  Problem Relation Age of Onset  . Heart disease Mother   . Hypertension Mother    Social History   Social History  . Marital Status: Single    Spouse Name: N/A  . Number of Children: 2  . Years of Education: N/A   Occupational History  . retired [employer]  . Smoking status: Never Smoker   . Smokeless tobacco: Never Used  . Alcohol Use: No  . Drug Use: No  . Sexual Activity: Not on file   Other Topics Concern  . Not on file   Social History Narrative   Lives alone   ROS: Full ROS obtained, pertinent positives and negatives as above  Blood pressure 136/84, pulse 76, temperature 97.9 F (36.6 C), temperature source Oral, resp. rate 18, height 5' 2.5" (1.588 m), weight 176 lb 14.4 oz (80.241 kg), SpO2 99 %. GEN: NAD/A&Ox3 FACE: no obvious facial trauma, normal external nose, normal external ears EYES: no scleral icterus, no conjunctivitis HEAD: normocephalic atraumatic CV: RRR, no MRG RESP: moving air well, lungs clear ABD: soft, nontender, nondistended EXT: moving all ext well, strength 5/5 NEURO: cnII-XII grossly intact, sensation intact all 4 ext  Labs: Personally reviewed, significant for WBC 14.9 (23.8 at admission), hgb 6.4  Bili 2.3 (2.7 yesterday, normal at baseline)  Imaging: Reviewed, gallbladder with gallstones, CBD 5 mm  A/P 59 yo F admit with SOB, fevers, leukocytosis.  + hyperbilirubinemia, + GBC on blood cultures.  No active abdominal pain.  Etiology of  hyperbilirubinemia unknown, may be due to choledocholithiasis.  + BC and leukocytosis I still suspect is due to pna as I feel that cholangitis would likely be associated with abd pain (Charcot's triad = RUQ pain, fever, jaundice).  Agree with obtaining GI consult.

## 2014-12-02 NOTE — Progress Notes (Signed)
[facility]  ROUNDING NOTE   Subjective:  Pt admitted probable pneumonia on the left. This was noted in August as well. Also has low hemoglobin of 6.4.   Had melena last week apparently.  Completed dialysis yesterday.   Objective:  Vital signs in last 24 hours:  Temp:  [97.4 F (36.3 C)-100 F (37.8 C)] 97.7 F (36.5 C) (11/13 1235) Pulse Rate:  [66-116] 116 (11/13 1235) Resp:  [2-49] 20 (11/13 1235) BP: (128-157)/(79-97) 128/79 mmHg (11/13 1235) SpO2:  [92 %-100 %] 100 % (11/13 1235) Weight:  [79.334 kg (174 lb 14.4 oz)-80.241 kg (176 lb 14.4 oz)] 80.241 kg (176 lb 14.4 oz) (11/13 0352)  Weight change:  Filed Weights   12/01/14 1100 12/01/14 1500 12/02/14 0352  Weight: 83.915 kg (185 lb) 79.334 kg (174 lb 14.4 oz) 80.241 kg (176 lb 14.4 oz)    Intake/Output: I/O last 3 completed shifts: In: 800 [IV Piggyback:800] Out: 0    Intake/Output this shift:     Physical Exam: General: NAD, sitting up in bed  Head: Normocephalic, atraumatic. Moist oral mucosal membranes  Eyes: Anicteric  Neck: Supple, trachea midline  Lungs:  Clear to auscultation  Heart: Regular rate and rhythm  Abdomen:  Soft, nontender, BS present  Extremities:  2+ peripheral edema.  Neurologic: Nonfocal, moving all four extremities  Skin: No lesions  Access: R IJ permcath    Basic Metabolic Panel:  Recent Labs Lab 12/01/14 1132 12/02/14 0523  NA 136 134*  K 3.2* 3.1*  CL 99* 101  CO2 27 26  GLUCOSE 131* 95  BUN 7 12  CREATININE 2.75* 3.76*  CALCIUM 8.3* 8.5*    Liver Function Tests:  Recent Labs Lab 12/01/14 1132 12/02/14 0523  AST 34 24  ALT 19 16  ALKPHOS 76 56  BILITOT 2.7* 2.3*  PROT 7.6 6.1*  ALBUMIN 3.0* 2.6*    Recent Labs Lab 12/01/14 1132  LIPASE 25   No results for input(s): AMMONIA in the last 168 hours.  CBC:  Recent Labs Lab 12/01/14 1201 12/02/14 0523  WBC 23.8* 14.9*  HGB 7.8* 6.4*  HCT 24.6* 21.3*  MCV 81.4 84.4  PLT 281 168     Cardiac Enzymes:  Recent Labs Lab 12/01/14 1132  TROPONINI 0.62*    BNP: Invalid input(s): POCBNP  CBG: No results for input(s): GLUCAP in the last 168 hours.  Microbiology: Results for orders placed or performed during the hospital encounter of 12/01/14  Culture, blood (routine x 2)     Status: None (Preliminary result)   Collection Time: 12/01/14 11:34 AM  Result Value Ref Range Status   Specimen Description BLOOD LEFT ASSIST CONTROL  Final   Special Requests BAA,8ML,ANA,AER  Final   Culture  Setup Time   Final    GRAM POSITIVE COCCI IN BOTH AEROBIC AND ANAEROBIC BOTTLES CRITICAL RESULT CALLED TO, READ BACK BY AND VERIFIED WITH: JANE PHLUFMM AT [place] 12/02/14.PMH CONFIRMED BY [facility]    Culture   Final    GRAM POSITIVE COCCI IN BOTH AEROBIC AND ANAEROBIC BOTTLES IDENTIFICATION TO FOLLOW    Report Status PENDING  Incomplete  Culture, blood (routine x 2)     Status: None (Preliminary result)   Collection Time: 12/01/14 12:01 PM  Result Value Ref Range Status   Specimen Description BLOOD LEFT ARM  Final   Special Requests BOTTLES DRAWN AEROBIC AND ANAEROBIC 5CC  Final   Culture  Setup Time   Final    GRAM POSITIVE COCCI IN  BOTH AEROBIC AND ANAEROBIC BOTTLES CRITICAL RESULT CALLED TO, READ BACK BY AND VERIFIED WITH: JANE PHLUFMM AT [id number] 12/02/14.PMH CONFIRMED BY RWW    Culture   Final    GRAM POSITIVE COCCI IN BOTH AEROBIC AND ANAEROBIC BOTTLES IDENTIFICATION TO FOLLOW    Report Status PENDING  Incomplete  MRSA PCR Screening     Status: Abnormal   Collection Time: 12/01/14  3:17 PM  Result Value Ref Range Status   MRSA by PCR POSITIVE (A) NEGATIVE Final    Comment:        The GeneXpert MRSA Assay (FDA approved for NASAL specimens only), is one component of a comprehensive MRSA colonization surveillance program. It is not intended to diagnose MRSA infection nor to guide or monitor treatment for MRSA infections. CRITICAL RESULT CALLED TO, READ BACK BY AND  VERIFIED WITH: MADDIE HIMES AT [id number] ON 12/01/14 BY [facility]     Coagulation Studies: No results for input(s): LABPROT, INR in the last 72 hours.  Urinalysis:  Recent Labs  12/01/14 1209  COLORURINE YELLOW*  LABSPEC 1.016  PHURINE 7.0  GLUCOSEU NEGATIVE  HGBUR 2+*  BILIRUBINUR NEGATIVE  KETONESUR NEGATIVE  PROTEINUR >500*  NITRITE NEGATIVE  LEUKOCYTESUR 2+*      Imaging: Dg Chest Port 1 View  12/01/2014  CLINICAL DATA:  Dialysis patient, now extreme shortness of breath and fever. EXAM: PORTABLE CHEST 1 VIEW COMPARISON:  Chest CT dated 09/02/2014 and chest x-ray dated 09/01/2014. FINDINGS: The cardiomegaly appears stable. Again noted is central pulmonary vascular congestion and bilateral interstitial edema consistent with chronic/recurrent congestive heart failure. Dense opacity at the left lung base is slightly increased, consistent with a combination of consolidation and effusion seen on previous chest CT, presumably chronic. The right-sided dialysis catheter is stable in position. No pneumothorax. No acute osseous abnormality. IMPRESSION: 1. Cardiomegaly with central pulmonary vascular congestion and bilateral interstitial edema, not significantly changed in appearance compared to the previous study dated 09/01/2014, consistent with chronic/recurrent congestive heart failure. 2. Dense opacity at the left lung base is stable, or slightly increased, compared to the previous chest x-ray of 09/01/2014. On interval chest CT, this was due to a combination of consolidation (atelectasis versus pneumonia) and pleural effusion. Electronically Signed   By: Franki Cabot M.D.   On: 12/01/2014 12:26   US Abdomen Limited Ruq  12/01/2014  CLINICAL DATA:  Elevated bilirubin.  Known gallstones. EXAM: US ABDOMEN LIMITED - RIGHT UPPER QUADRANT COMPARISON:  CT 01/18/2014 FINDINGS: Gallbladder: Moderate cholelithiasis with larger stone measuring 2.3 cm. No significant wall thickening as the wall measures 2.4  mm. Negative sonographic Murphy sign. No adjacent free fluid. Common bile duct: Diameter: 5 mm. Liver: No focal lesion identified. Within normal limits in parenchymal echogenicity. Incidental finding of significant right renal cortical thinning and increased echogenicity compatible with medical renal disease. IMPRESSION: Moderate cholelithiasis with the larger stone measuring 2.3 cm. No additional findings to suggest cholecystitis. Right renal findings suggesting medical renal disease. Electronically Signed   By: Marin Olp M.D.   On: 12/01/2014 18:03     Medications:     . sodium chloride   Intravenous Once  . albuterol  2.5 mg Nebulization Q6H  . amiodarone  200 mg Oral BID  . amLODipine  5 mg Oral Daily  . antiseptic oral rinse  7 mL Mouth Rinse BID  . antiseptic oral rinse  7 mL Mouth Rinse BID  . calcium carbonate  1 tablet Oral BID PC  . Chlorhexidine Gluconate Cloth  6 each Topical Q0600  . cinacalcet  60 mg Oral Q breakfast  . docusate sodium  100 mg Oral BID  . ferrous sulfate  325 mg Oral Daily  . folic acid-vitamin b complex-vitamin c-selenium-zinc  1 tablet Oral Daily  . furosemide  80 mg Oral Daily  . insulin aspart  0-5 Units Subcutaneous QHS  . insulin aspart  0-9 Units Subcutaneous TID WC  . isosorbide mononitrate  30 mg Oral Daily  . metoprolol tartrate  12.5 mg Oral BID  . mometasone-formoterol  2 puff Inhalation BID  . mupirocin ointment  1 application Nasal BID  . pantoprazole  40 mg Oral Daily  . piperacillin-tazobactam (ZOSYN)  IV  3.375 g Intravenous Q12H  . sodium chloride  3 mL Intravenous Q12H  . [START ON 12/04/2014] vancomycin  750 mg Intravenous Once per day on Tue Thu Sat   sodium chloride, acetaminophen, diazepam, hydrALAZINE, Lidocaine-Prilocaine (Bulk), ondansetron (ZOFRAN) IV, oxyCODONE, sodium chloride, traMADol  Assessment/ Plan:  59 y.o. 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  ESRD on HD TTHS:  Pt had HD yesterday, no acute indication for HD today, will plan for HD again on Tuesday.   2.  Anemia of CKD/anemia blood loss:  Pt had blood transfusion in late October.  Still having melena.  Await further input from GI.   3.  SHPTH:  Continue sensipar and calcium carbonate.    4.  Sepsis:  Blood cultures x 2 sets positive, likely due to infected permcath.  Cotninue vancomycin, will likely need to consult with vascular to remove permcath and place a new one tomorrow.  Await further culture data.  5.  Left sided bacterial pneumonia:  This was noted on prior CT scan and current CXR. -Agree with vancomycin and zosyn.  Could consider repeat CT scan as well as pulmonary consultation given persistent nature of infiltrate.       LOS: 1 Dustine Stickler 11/13/201612:55 PM

## 2014-12-02 NOTE — Consult Note (Signed)
I have came and evaluated Kelsey Peterson.  Patient adamantly refused interaction because she was tired.  I have told her that I will return in 1-2 hours but that it is important that she speak with me.  Admitted for SOB, fever, found to have hyperbilirubinemia and gallstones.  Agree with GI input.  Will return.

## 2014-12-02 NOTE — Progress Notes (Signed)
Nurse notified for + blood c/s GPC - already on appropriate abx (Vanco & Zosyn)

## 2014-12-02 NOTE — Progress Notes (Signed)
Patient rested quietly most of the day. Sat in chair for awhile. Continued to refuse bed alarm and socks throughout the day. Received one unit of blood, no evidence of reaction.

## 2014-12-02 NOTE — Progress Notes (Signed)
Patient refusing socks, bed alarm and all morning medications. Educated on safety. Will continue to monitor.   Dr. Verdell Carmine aware of low potassium. Instructed to give Dr. Holley Raring some time to start rounds on the floor and let him know.

## 2014-12-02 NOTE — Progress Notes (Signed)
ANTIBIOTIC CONSULT NOTE - FOLLOW UP   Pharmacy Consult for Vancomycin and Zosyn Indication: pneumonia  Allergies  Allergen Reactions  . Contrast Media [Iodinated Diagnostic Agents] Other (See Comments)    Unknown reaction  . Morphine And Related Hives  . Other Rash    Blood Pressure Pill (Pt doesn't remember name)     Patient Measurements: Height: 5' 2.5" (158.8 cm) (stated) Weight: 176 lb 14.4 oz (80.241 kg) IBW/kg (Calculated) : 51.25 Adjusted Body Weight: 63.2  Vital Signs: Temp: 97.4 F (36.3 C) (11/13 0352) Temp Source: Oral (11/13 0352) BP: 133/89 mmHg (11/13 0352) Pulse Rate: 66 (11/13 0352) Intake/Output from previous day: 11/12 0701 - 11/13 0700 In: 800 [IV Piggyback:800] Out: 0  Intake/Output from this shift:    Labs:  Recent Labs  12/01/14 1132 12/01/14 1201 12/02/14 0523  WBC  --  23.8* 14.9*  HGB  --  7.8* 6.4*  PLT  --  281 168  CREATININE 2.75*  --  3.76*   Estimated Creatinine Clearance: 15.8 mL/min (by C-G formula based on Cr of 3.76). No results for input(s): VANCOTROUGH, VANCOPEAK, VANCORANDOM, GENTTROUGH, GENTPEAK, GENTRANDOM, TOBRATROUGH, TOBRAPEAK, TOBRARND, AMIKACINPEAK, AMIKACINTROU, AMIKACIN in the last 72 hours.   Microbiology: Recent Results (from the past 720 hour(s))  Culture, blood (routine x 2)     Status: None (Preliminary result)   Collection Time: 12/01/14 11:34 AM  Result Value Ref Range Status   Specimen Description BLOOD LEFT ASSIST CONTROL  Final   Special Requests BAA,8ML,ANA,AER  Final   Culture  Setup Time   Final    GRAM POSITIVE COCCI IN BOTH AEROBIC AND ANAEROBIC BOTTLES CRITICAL RESULT CALLED TO, READ BACK BY AND VERIFIED WITH: JANE PHLUFMM AT [place] 12/02/14.PMH CONFIRMED BY RWW    Culture   Final    GRAM POSITIVE COCCI IN BOTH AEROBIC AND ANAEROBIC BOTTLES IDENTIFICATION TO FOLLOW    Report Status PENDING  Incomplete  Culture, blood (routine x 2)     Status: None (Preliminary result)   Collection Time:  12/01/14 12:01 PM  Result Value Ref Range Status   Specimen Description BLOOD LEFT ARM  Final   Special Requests BOTTLES DRAWN AEROBIC AND ANAEROBIC 5CC  Final   Culture  Setup Time   Final    GRAM POSITIVE COCCI IN BOTH AEROBIC AND ANAEROBIC BOTTLES CRITICAL RESULT CALLED TO, READ BACK BY AND VERIFIED WITH: JANE PHLUFMM AT [id number] 12/02/14.PMH CONFIRMED BY RWW    Culture   Final    GRAM POSITIVE COCCI IN BOTH AEROBIC AND ANAEROBIC BOTTLES IDENTIFICATION TO FOLLOW    Report Status PENDING  Incomplete  MRSA PCR Screening     Status: Abnormal   Collection Time: 12/01/14  3:17 PM  Result Value Ref Range Status   MRSA by PCR POSITIVE (A) NEGATIVE Final    Comment:        The GeneXpert MRSA Assay (FDA approved for NASAL specimens only), is one component of a comprehensive MRSA colonization surveillance program. It is not intended to diagnose MRSA infection nor to guide or monitor treatment for MRSA infections. CRITICAL RESULT CALLED TO, READ BACK BY AND VERIFIED WITH: MADDIE HIMES AT [id number] ON 12/01/14 BY [facility]     Medical History: Past Medical History  Diagnosis Date  . COPD (chronic obstructive pulmonary disease) ([place])   . ESRD on hemodialysis ([place])   . DM2 (diabetes mellitus, type 2) ([place])   . Hepatitis C   . Migraine with aura   . HOH (hard of hearing)   .  Mitral regurgitation     a. echo 06/2014: EF 60%, no WMA, GR1DD, mild AI, calcified mitral annulus, mod MR, no atrial septal defect or patent foramen ovale   . Cognitive impairment   . CAD (coronary artery disease)   . HTN (hypertension)     Medications:  Prescriptions prior to admission  Medication Sig Dispense Refill Last Dose  . albuterol (PROVENTIL HFA;VENTOLIN HFA) 108 (90 BASE) MCG/ACT inhaler Inhale 2 puffs into the lungs every 6 (six) hours as needed for wheezing or shortness of breath. (Patient taking differently: Inhale 2 puffs into the lungs every 4 (four) hours as needed for wheezing or shortness of breath.  ) 1 Inhaler 2   . albuterol (PROVENTIL) (2.5 MG/3ML) 0.083% nebulizer solution Take 2.5 mg by nebulization every 4 (four) hours.     Marland Kitchen amLODipine (NORVASC) 5 MG tablet Take 5 mg by mouth daily.   unknown  . calcium carbonate (TUMS - DOSED IN MG ELEMENTAL CALCIUM) 500 MG chewable tablet Chew 1 tablet by mouth 2 (two) times daily after a meal. Take 2 hours after meal.   unknown  . clopidogrel (PLAVIX) 75 MG tablet Take 75 mg by mouth daily.   unknown  . esomeprazole (NEXIUM) 40 MG capsule Take 40 mg by mouth daily.    unknown  . ferrous sulfate 325 (65 FE) MG tablet Take 325 mg by mouth daily.   unknown  . folic acid-vitamin b complex-vitamin c-selenium-zinc (DIALYVITE) 3 MG TABS tablet Take 1 tablet by mouth daily.   unknown  . furosemide (LASIX) 80 MG tablet Take 80 mg by mouth daily. Patient takes on non dialysis days.   unknown  . isosorbide mononitrate (IMDUR) 30 MG 24 hr tablet Take 30 mg by mouth daily.   unknown  . Lidocaine-Prilocaine, Bulk, [id number] % CREA 1 application by Other route as needed (for tx). Apply to site 20-45 minutes before tx.   unknown  . predniSONE (DELTASONE) 10 MG tablet 3 tab daily for 2 days 2 tab daily for 2 days 1 tab daily for 2 days 12 tablet 0   . sevelamer carbonate (RENVELA) 800 MG tablet Take 800 mg by mouth 3 (three) times daily with meals.     . diazepam (VALIUM) 2 MG tablet Take 1 tablet (2 mg total) by mouth every 8 (eight) hours as needed for muscle spasms. 15 tablet 0   . oxyCODONE (OXY IR/ROXICODONE) 5 MG immediate release tablet Take 1 tablet (5 mg total) by mouth every 4 (four) hours as needed for severe pain. 10 tablet 0   . traMADol (ULTRAM) 50 MG tablet Take 50 mg by mouth 2 (two) times daily as needed for moderate pain.   unknown   Scheduled:  . sodium chloride   Intravenous Once  . albuterol  2.5 mg Nebulization Q6H  . amiodarone  200 mg Oral BID  . amLODipine  5 mg Oral Daily  . antiseptic oral rinse  7 mL Mouth Rinse BID  . antiseptic oral  rinse  7 mL Mouth Rinse BID  . calcium carbonate  1 tablet Oral BID PC  . Chlorhexidine Gluconate Cloth  6 each Topical Q0600  . cinacalcet  60 mg Oral Q breakfast  . docusate sodium  100 mg Oral BID  . ferrous sulfate  325 mg Oral Daily  . folic acid-vitamin b complex-vitamin c-selenium-zinc  1 tablet Oral Daily  . furosemide  80 mg Oral Daily  . insulin aspart  0-5 Units Subcutaneous QHS  .  insulin aspart  0-9 Units Subcutaneous TID WC  . isosorbide mononitrate  30 mg Oral Daily  . metoprolol tartrate  12.5 mg Oral BID  . mometasone-formoterol  2 puff Inhalation BID  . mupirocin ointment  1 application Nasal BID  . pantoprazole  40 mg Oral Daily  . piperacillin-tazobactam (ZOSYN)  IV  3.375 g Intravenous Q12H  . sodium chloride  3 mL Intravenous Q12H   Assessment: Patient with ESRD requiring hemodialysis per MD Bridgett Larsson H&P admitted for pneumonia.   Per RN Lovena Le, patient received hemodialysis as outpatient prior to being admitted on 11/12. Patient's normal hemodialysis schedule is [facility], Sat. Patient may receive dialysis on Monday 11/14 depending on labs and overall condition. Will need to follow closely for now. Will continue with plan as outlined below.   Goal of Therapy:  Vancomycin trough 15-25 mcg/ml  Plan:  Follow up culture results  Patient received Vancomycin 1500 mg IV x 1 loading dose. Patient will need Vancomycin 750  Mg IV qHD (Tues, Thurs, and Sat). Will order Vancomycin trough level prior to the likely Saturday hemodialysis session.    Zosyn: Will continue Zosyn 3.375 g IV q12 hours   Mukhtar Shams D 12/02/2014,10:59 AM

## 2014-12-02 NOTE — Progress Notes (Signed)
Pt a&o x4. Pt refused blood sugar checks. Pt very HOH. Remains on isolation. Lab report + blood culture x2. Dr. Manuella Ghazi notified, pt already on abx. IV in l hand intact/patent. O2/[place] @3l  continuous. Pt sitting on side of bed this morning with o2 off. Pt replaced o2 with repeated requests from RN. No c/o pain voiced during the night

## 2014-12-02 NOTE — Progress Notes (Signed)
Hb 6.4 this am - will order 1 PRBC

## 2014-12-02 NOTE — Progress Notes (Signed)
Pt is refusing CBG, states "I am not a diabetic", continues to refuse bed alarm and socks on shift.

## 2014-12-02 NOTE — Progress Notes (Signed)
Pt refusing to have her blood sugars taken.

## 2014-12-03 ENCOUNTER — Encounter: Payer: Self-pay | Admitting: Gastroenterology

## 2014-12-03 LAB — COMPREHENSIVE METABOLIC PANEL
ALBUMIN: 2.9 g/dL — AB (ref 3.5–5.0)
ALK PHOS: 58 U/L (ref 38–126)
ALT: 20 U/L (ref 14–54)
ANION GAP: 10 (ref 5–15)
AST: 28 U/L (ref 15–41)
BILIRUBIN TOTAL: 1.9 mg/dL — AB (ref 0.3–1.2)
BUN: 17 mg/dL (ref 6–20)
CALCIUM: 8.6 mg/dL — AB (ref 8.9–10.3)
CO2: 27 mmol/L (ref 22–32)
Chloride: 95 mmol/L — ABNORMAL LOW (ref 101–111)
Creatinine, Ser: 4.52 mg/dL — ABNORMAL HIGH (ref 0.44–1.00)
GFR, EST AFRICAN AMERICAN: 11 mL/min — AB (ref >60)
GFR, EST NON AFRICAN AMERICAN: 10 mL/min — AB (ref >60)
GLUCOSE: 131 mg/dL — AB (ref 65–99)
Potassium: 3.6 mmol/L (ref 3.5–5.1)
SODIUM: 132 mmol/L — AB (ref 135–145)
TOTAL PROTEIN: 7 g/dL (ref 6.5–8.1)

## 2014-12-03 LAB — CBC
HCT: 25.1 % — ABNORMAL LOW (ref 35.0–47.0)
HEMOGLOBIN: 8.4 g/dL — AB (ref 12.0–16.0)
MCH: 27.9 pg (ref 26.0–34.0)
MCHC: 33.6 g/dL (ref 32.0–36.0)
MCV: 83.1 fL (ref 80.0–100.0)
Platelets: 208 10*3/uL (ref 150–440)
RBC: 3.02 MIL/uL — ABNORMAL LOW (ref 3.80–5.20)
RDW: 23.5 % — ABNORMAL HIGH (ref 11.5–14.5)
WBC: 10.6 10*3/uL (ref 3.6–11.0)

## 2014-12-03 LAB — TYPE AND SCREEN
ABO/RH(D): O NEG
ANTIBODY SCREEN: NEGATIVE
Unit division: 0

## 2014-12-03 MED ORDER — ALUM & MAG HYDROXIDE-SIMETH 200-200-20 MG/5ML PO SUSP
30.0000 mL | Freq: Four times a day (QID) | ORAL | Status: DC | PRN
  Administered 2014-12-03: 30 mL via ORAL
  Filled 2014-12-03: qty 30

## 2014-12-03 NOTE — Progress Notes (Signed)
[facility]  ROUNDING NOTE   Subjective:  Patient has gram-positive cocci in noted in the blood. She is currently on broad spectrum antibiotic therapy. Due for dialysis again tomorrow.   Objective:  Vital signs in last 24 hours:  Temp:  [97.6 F (36.4 C)-97.8 F (36.6 C)] 97.6 F (36.4 C) (11/14 0507) Pulse Rate:  [65-77] 66 (11/14 0507) Resp:  [19-20] 20 (11/14 0507) BP: (141-158)/(90-97) 158/91 mmHg (11/14 0507) SpO2:  [98 %-100 %] 99 % (11/14 0507)  Weight change:  Filed Weights   12/01/14 1100 12/01/14 1500 12/02/14 0352  Weight: 83.915 kg (185 lb) 79.334 kg (174 lb 14.4 oz) 80.241 kg (176 lb 14.4 oz)    Intake/Output: I/O last 3 completed shifts: In: 530 [Blood:380; IV Piggyback:150] Out: 0    Intake/Output this shift:     Physical Exam: General: NAD, sitting up in bed  Head: Normocephalic, atraumatic. Moist oral mucosal membranes  Eyes: Anicteric  Neck: Supple, trachea midline  Lungs:  Clear to auscultation  Heart: Regular rate and rhythm  Abdomen:  Soft, nontender, BS present  Extremities:  2+ peripheral edema.  Neurologic: Nonfocal, moving all four extremities  Skin: No lesions  Access: R IJ permcath    Basic Metabolic Panel:  Recent Labs Lab 12/01/14 1132 12/02/14 0523 12/03/14 0419  NA 136 134* 132*  K 3.2* 3.1* 3.6  CL 99* 101 95*  CO2 27 26 27   GLUCOSE 131* 95 131*  BUN 7 12 17   CREATININE 2.75* 3.76* 4.52*  CALCIUM 8.3* 8.5* 8.6*    Liver Function Tests:  Recent Labs Lab 12/01/14 1132 12/02/14 0523 12/03/14 0419  AST 34 24 28  ALT 19 16 20   ALKPHOS 76 56 58  BILITOT 2.7* 2.3* 1.9*  PROT 7.6 6.1* 7.0  ALBUMIN 3.0* 2.6* 2.9*    Recent Labs Lab 12/01/14 1132  LIPASE 25   No results for input(s): AMMONIA in the last 168 hours.  CBC:  Recent Labs Lab 12/01/14 1201 12/02/14 0523 12/02/14 1746 12/03/14 0419  WBC 23.8* 14.9*  --  10.6  HGB 7.8* 6.4* 8.4* 8.4*  HCT 24.6* 21.3* 26.8* 25.1*  MCV 81.4 84.4   --  83.1  PLT 281 168  --  208    Cardiac Enzymes:  Recent Labs Lab 12/01/14 1132  TROPONINI 0.62*    BNP: Invalid input(s): POCBNP  CBG: No results for input(s): GLUCAP in the last 168 hours.  Microbiology: Results for orders placed or performed during the hospital encounter of 12/01/14  Culture, blood (routine x 2)     Status: None (Preliminary result)   Collection Time: 12/01/14 11:34 AM  Result Value Ref Range Status   Specimen Description BLOOD LEFT ASSIST CONTROL  Final   Special Requests BAA,8ML,ANA,AER  Final   Culture  Setup Time   Final    GRAM POSITIVE COCCI IN BOTH AEROBIC AND ANAEROBIC BOTTLES CRITICAL RESULT CALLED TO, READ BACK BY AND VERIFIED WITH: JANE PHLUFMM AT [place] 12/02/14.PMH CONFIRMED BY RWW    Culture   Final    GRAM POSITIVE COCCI IN BOTH AEROBIC AND ANAEROBIC BOTTLES IDENTIFICATION AND SUSCEPTIBILITIES TO FOLLOW    Report Status PENDING  Incomplete  Culture, blood (routine x 2)     Status: None (Preliminary result)   Collection Time: 12/01/14 12:01 PM  Result Value Ref Range Status   Specimen Description BLOOD LEFT ARM  Final   Special Requests BOTTLES DRAWN AEROBIC AND ANAEROBIC 5CC  Final   Culture  Setup  Time   Final    GRAM POSITIVE COCCI IN BOTH AEROBIC AND ANAEROBIC BOTTLES CRITICAL RESULT CALLED TO, READ BACK BY AND VERIFIED WITH: JANE PHLUFMM AT [id number] 12/02/14.PMH CONFIRMED BY RWW    Culture   Final    GRAM POSITIVE COCCI IN BOTH AEROBIC AND ANAEROBIC BOTTLES IDENTIFICATION AND SUSCEPTIBILITIES TO FOLLOW    Report Status PENDING  Incomplete  MRSA PCR Screening     Status: Abnormal   Collection Time: 12/01/14  3:17 PM  Result Value Ref Range Status   MRSA by PCR POSITIVE (A) NEGATIVE Final    Comment:        The GeneXpert MRSA Assay (FDA approved for NASAL specimens only), is one component of a comprehensive MRSA colonization surveillance program. It is not intended to diagnose MRSA infection nor to guide or monitor  treatment for MRSA infections. CRITICAL RESULT CALLED TO, READ BACK BY AND VERIFIED WITH: MADDIE HIMES AT [id number] ON 12/01/14 BY [facility]     Coagulation Studies: No results for input(s): LABPROT, INR in the last 72 hours.  Urinalysis:  Recent Labs  12/01/14 1209  COLORURINE YELLOW*  LABSPEC 1.016  PHURINE 7.0  GLUCOSEU NEGATIVE  HGBUR 2+*  BILIRUBINUR NEGATIVE  KETONESUR NEGATIVE  PROTEINUR >500*  NITRITE NEGATIVE  LEUKOCYTESUR 2+*      Imaging: US Abdomen Limited Ruq  12/01/2014  CLINICAL DATA:  Elevated bilirubin.  Known gallstones. EXAM: US ABDOMEN LIMITED - RIGHT UPPER QUADRANT COMPARISON:  CT 01/18/2014 FINDINGS: Gallbladder: Moderate cholelithiasis with larger stone measuring 2.3 cm. No significant wall thickening as the wall measures 2.4 mm. Negative sonographic Murphy sign. No adjacent free fluid. Common bile duct: Diameter: 5 mm. Liver: No focal lesion identified. Within normal limits in parenchymal echogenicity. Incidental finding of significant right renal cortical thinning and increased echogenicity compatible with medical renal disease. IMPRESSION: Moderate cholelithiasis with the larger stone measuring 2.3 cm. No additional findings to suggest cholecystitis. Right renal findings suggesting medical renal disease. Electronically Signed   By: Marin Olp M.D.   On: 12/01/2014 18:03     Medications:     . sodium chloride   Intravenous Once  . albuterol  2.5 mg Nebulization Q6H  . amiodarone  200 mg Oral BID  . amLODipine  5 mg Oral Daily  . antiseptic oral rinse  7 mL Mouth Rinse BID  . antiseptic oral rinse  7 mL Mouth Rinse BID  . calcium carbonate  1 tablet Oral BID PC  . Chlorhexidine Gluconate Cloth  6 each Topical Q0600  . cinacalcet  60 mg Oral Q breakfast  . docusate sodium  100 mg Oral BID  . ferrous sulfate  325 mg Oral Daily  . folic acid-vitamin b complex-vitamin c-selenium-zinc  1 tablet Oral Daily  . furosemide  80 mg Oral Daily  . insulin aspart   0-5 Units Subcutaneous QHS  . insulin aspart  0-9 Units Subcutaneous TID WC  . isosorbide mononitrate  30 mg Oral Daily  . metoprolol tartrate  12.5 mg Oral BID  . mometasone-formoterol  2 puff Inhalation BID  . mupirocin ointment  1 application Nasal BID  . pantoprazole  40 mg Oral Daily  . piperacillin-tazobactam (ZOSYN)  IV  3.375 g Intravenous Q12H  . sodium chloride  3 mL Intravenous Q12H  . [START ON 12/04/2014] vancomycin  750 mg Intravenous Once per day on Tue Thu Sat   sodium chloride, acetaminophen, diazepam, hydrALAZINE, Lidocaine-Prilocaine (Bulk), ondansetron (ZOFRAN) IV, oxyCODONE, sodium chloride, traMADol  Assessment/ Plan:  59 y.o. 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  ESRD on HD TTHS:  Pt had HD yesterday, no acute indication for HD today, will plan for HD again on Tuesday.   2.  Anemia of CKD/anemia blood loss: Appreciate gastroneurology consultation. Continue Epogen with dialysis for now.  3.  SHPTH:  Continue sensipar and calcium carbonate.    4.  Sepsis:  Consult with vascular surgery regarding PermCath removal given positive blood cultures 2 sets.  5.  Left sided bacterial pneumonia:  This was noted on prior CT scan and current CXR. -continue zosyn/vancomycin.     LOS: 2 Mike Berntsen 11/14/20162:59 PM

## 2014-12-03 NOTE — Consult Note (Signed)
GI Inpatient Consult Note  Reason for Consult: Elevated LFT   Attending Requesting Consult:  History of Present Illness: Kelsey Peterson is a 59 y.o. female who was admitted with increasing SOB. Found to have pneumonia. Also, found to have LFT. U/S showed gallstones. CBD appear normal. I had seen pt in 6/16 for Fe def anemia and heme positive stool. EGD was normal. Colonoscopy showed multiple polyps, which were removed. However, due to plavix use, pt was readmitted 1 week later with polypectomy bleeding. Hemoclips were placed at polypectomy sites by Dr. Vira Agar. Pt again anemic on this admission. However, stool was heme negative. I was asked to see pt for LFT. Pt poor historian. Not able to get much hx. Pt is still SOB on exertion on O2.  No complaints of abdominal pain now.  Past Medical History:  Past Medical History  Diagnosis Date  . COPD (chronic obstructive pulmonary disease) ([place])   . ESRD on hemodialysis ([place])   . DM2 (diabetes mellitus, type 2) ([place])   . Hepatitis C   . Migraine with aura   . HOH (hard of hearing)   . Mitral regurgitation     a. echo 06/2014: EF 60%, no WMA, GR1DD, mild AI, calcified mitral annulus, mod MR, no atrial septal defect or patent foramen ovale   . Cognitive impairment   . CAD (coronary artery disease)   . HTN (hypertension)     Problem List: Patient Active Problem List   Diagnosis Date Noted  . Pneumonia 12/01/2014  . Sepsis ([place]) 12/01/2014  . UTI (lower urinary tract infection) 12/01/2014  . Lactic acid acidosis 12/01/2014  . Cholelithiasis 12/01/2014  . Hyperkalemia 09/01/2014  . Fluid overload 09/01/2014  . Malignant hypertension 09/01/2014  . GI bleed 07/14/2014  . GERD (gastroesophageal reflux disease) 07/14/2014  . COPD (chronic obstructive pulmonary disease) ([place]) 07/14/2014  . HTN (hypertension) 07/14/2014  . CAD (coronary artery disease) 07/14/2014  . End stage renal disease on dialysis ([place])   . Supraventricular tachycardia  ([place])   . NSVT (nonsustained ventricular tachycardia) ([place])   . Pulmonary edema 07/03/2014  . Anemia 07/03/2014  . Pain in the chest   . Diarrhea   . NSTEMI (non-ST elevated myocardial infarction) ([place]) 06/21/2014    Past Surgical History: Past Surgical History  Procedure Laterality Date  . Dialysis fistula creation    . Tubal ligation    . Colonoscopy with propofol Left 07/09/2014    Procedure: COLONOSCOPY WITH PROPOFOL;  Surgeon: Hulen Luster, MD;  Location: [facility] ENDOSCOPY;  Service: Endoscopy;  Laterality: Left;  . Colonoscopy N/A 07/15/2014    Procedure: COLONOSCOPY;  Surgeon: Manya Silvas, MD;  Location: [facility] ENDOSCOPY;  Service: Endoscopy;  Laterality: N/A;    Allergies: Allergies  Allergen Reactions  . Contrast Media [Iodinated Diagnostic Agents] Other (See Comments)    Unknown reaction  . Morphine And Related Hives  . Other Rash    Blood Pressure Pill (Pt doesn't remember name)     Home Medications: Prescriptions prior to admission  Medication Sig Dispense Refill Last Dose  . albuterol (PROVENTIL HFA;VENTOLIN HFA) 108 (90 BASE) MCG/ACT inhaler Inhale 2 puffs into the lungs every 6 (six) hours as needed for wheezing or shortness of breath. (Patient taking differently: Inhale 2 puffs into the lungs every 4 (four) hours as needed for wheezing or shortness of breath. ) 1 Inhaler 2   . albuterol (PROVENTIL) (2.5 MG/3ML) 0.083% nebulizer solution Take 2.5 mg by nebulization every 4 (four) hours.     Marland Kitchen  amLODipine (NORVASC) 5 MG tablet Take 5 mg by mouth daily.   unknown  . calcium carbonate (TUMS - DOSED IN MG ELEMENTAL CALCIUM) 500 MG chewable tablet Chew 1 tablet by mouth 2 (two) times daily after a meal. Take 2 hours after meal.   unknown  . clopidogrel (PLAVIX) 75 MG tablet Take 75 mg by mouth daily.   unknown  . esomeprazole (NEXIUM) 40 MG capsule Take 40 mg by mouth daily.    unknown  . ferrous sulfate 325 (65 FE) MG tablet Take 325 mg by mouth daily.   unknown  . folic  acid-vitamin b complex-vitamin c-selenium-zinc (DIALYVITE) 3 MG TABS tablet Take 1 tablet by mouth daily.   unknown  . furosemide (LASIX) 80 MG tablet Take 80 mg by mouth daily. Patient takes on non dialysis days.   unknown  . isosorbide mononitrate (IMDUR) 30 MG 24 hr tablet Take 30 mg by mouth daily.   unknown  . Lidocaine-Prilocaine, Bulk, A999333 % CREA 1 application by Other route as needed (for tx). Apply to site 20-45 minutes before tx.   unknown  . predniSONE (DELTASONE) 10 MG tablet 3 tab daily for 2 days 2 tab daily for 2 days 1 tab daily for 2 days 12 tablet 0   . sevelamer carbonate (RENVELA) 800 MG tablet Take 800 mg by mouth 3 (three) times daily with meals.     . diazepam (VALIUM) 2 MG tablet Take 1 tablet (2 mg total) by mouth every 8 (eight) hours as needed for muscle spasms. 15 tablet 0   . oxyCODONE (OXY IR/ROXICODONE) 5 MG immediate release tablet Take 1 tablet (5 mg total) by mouth every 4 (four) hours as needed for severe pain. 10 tablet 0   . traMADol (ULTRAM) 50 MG tablet Take 50 mg by mouth 2 (two) times daily as needed for moderate pain.   unknown   Home medication reconciliation was completed with the patient.   Scheduled Inpatient Medications:   . sodium chloride   Intravenous Once  . albuterol  2.5 mg Nebulization Q6H  . amiodarone  200 mg Oral BID  . amLODipine  5 mg Oral Daily  . antiseptic oral rinse  7 mL Mouth Rinse BID  . antiseptic oral rinse  7 mL Mouth Rinse BID  . calcium carbonate  1 tablet Oral BID PC  . Chlorhexidine Gluconate Cloth  6 each Topical Q0600  . cinacalcet  60 mg Oral Q breakfast  . docusate sodium  100 mg Oral BID  . ferrous sulfate  325 mg Oral Daily  . folic acid-vitamin b complex-vitamin c-selenium-zinc  1 tablet Oral Daily  . furosemide  80 mg Oral Daily  . insulin aspart  0-5 Units Subcutaneous QHS  . insulin aspart  0-9 Units Subcutaneous TID WC  . isosorbide mononitrate  30 mg Oral Daily  . metoprolol tartrate  12.5 mg Oral  BID  . mometasone-formoterol  2 puff Inhalation BID  . mupirocin ointment  1 application Nasal BID  . pantoprazole  40 mg Oral Daily  . piperacillin-tazobactam (ZOSYN)  IV  3.375 g Intravenous Q12H  . sodium chloride  3 mL Intravenous Q12H  . [START ON 12/04/2014] vancomycin  750 mg Intravenous Once per day on Tue Thu Sat    Continuous Inpatient Infusions:     PRN Inpatient Medications:  sodium chloride, acetaminophen, diazepam, hydrALAZINE, Lidocaine-Prilocaine (Bulk), ondansetron (ZOFRAN) IV, oxyCODONE, sodium chloride, traMADol  Family History: family history includes Heart disease in her mother; Hypertension  in her mother.  The patient's family history is negative for inflammatory bowel disorders, GI malignancy, or solid organ transplantation.  Social History:   reports that she has never smoked. She has never used smokeless tobacco. She reports that she does not drink alcohol or use illicit drugs. The patient denies ETOH, tobacco, or drug use.   Review of Systems: Constitutional: Weight is stable.  Eyes: No changes in vision. ENT: No oral lesions, sore throat.  GI: see HPI.  Heme/Lymph: No easy bruising.  CV: No chest pain.  GU: No hematuria.  Integumentary: No rashes.  Neuro: No headaches.  Psych: No depression/anxiety.  Endocrine: No heat/cold intolerance.  Allergic/Immunologic: No urticaria.  Resp: Increasing SOB.  Musculoskeletal: No joint swelling.    Physical Examination: BP 158/91 mmHg  Pulse 66  Temp(Src) 97.6 F (36.4 C) (Oral)  Resp 20  Ht 5' 2.5" (1.588 m)  Wt 80.241 kg (176 lb 14.4 oz)  BMI 31.82 kg/m2  SpO2 99% Gen: NAD, alert and oriented x 4 HEENT: PEERLA, EOMI, Neck: supple, no JVD or thyromegaly Chest: Crackles on left lung base. CV: RRR, no m/g/c/r Abd: soft, NT, ND, +BS in all four quadrants; no HSM, guarding, ridigity, or rebound tenderness Ext: no edema, well perfused with 2+ pulses, Skin: no rash or lesions noted Lymph: no  LAD  Data: Lab Results  Component Value Date   WBC 10.6 12/03/2014   HGB 8.4* 12/03/2014   HCT 25.1* 12/03/2014   MCV 83.1 12/03/2014   PLT 208 12/03/2014    Recent Labs Lab 12/02/14 0523 12/02/14 1746 12/03/14 0419  HGB 6.4* 8.4* 8.4*   Lab Results  Component Value Date   NA 132* 12/03/2014   K 3.6 12/03/2014   CL 95* 12/03/2014   CO2 27 12/03/2014   BUN 17 12/03/2014   CREATININE 4.52* 12/03/2014   Lab Results  Component Value Date   ALT 20 12/03/2014   AST 28 12/03/2014   ALKPHOS 58 12/03/2014   BILITOT 1.9* 12/03/2014   No results for input(s): APTT, INR, PTT in the last 168 hours. Assessment/Plan: Kelsey Peterson is a 59 y.o. female with pneumonia and LFT elevation.  Recommendations: If LFT remains elevated, then order MRCP to evaluate CBD. If MRCP abnormal, will then consider ERCP later once pt is off plavix for 5 days and Pt's respiratory status more stable. I will be out tomorrow. Will check back on Wed. Thank you for the consult. Please call with questions or concerns.  Malone Vanblarcom, Lupita Dawn, MD

## 2014-12-03 NOTE — Care Management Important Message (Signed)
Important Message  Patient Details  Name: Kelsey Peterson MRN: [id number] Date of Birth: 05-10-1955   Medicare Important Message Given:  Yes    Beverly Sessions, RN 12/03/2014, 9:22 AM

## 2014-12-03 NOTE — Progress Notes (Signed)
Initial Nutrition Assessment       INTERVENTION:  Meals and snacks: Cater to pt preferences. Recommend removing carb modified diet restriction to current renal diet.  Called kitchen for all 3 meals today.  Pt requesting kitchen to not call her for meal order.  Host/hostess staff will not go in pt room as on isolation. Discussed withRN, Tammy need for nursing to call pt meal order to kitchen. Preferences taken  Coordination of care: Pt may benefit from SLP evaluation  NUTRITION DIAGNOSIS:   Inadequate oral intake related to  (not receiving items pt can eat) as evidenced by meal completion < 25%.    GOAL:   Patient will meet greater than or equal to 90% of their needs    MONITOR:    (Energy intake, Electrolyte and renal profile, glucose profile)  REASON FOR ASSESSMENT:    (dialysis pt)    ASSESSMENT:      Pt admitted with bacteremia, pneumonia  Past Medical History  Diagnosis Date  . COPD (chronic obstructive pulmonary disease) ([place])   . ESRD on hemodialysis ([place])   . DM2 (diabetes mellitus, type 2) ([place])   . Hepatitis C   . Migraine with aura   . HOH (hard of hearing)   . Mitral regurgitation     a. echo 06/2014: EF 60%, no WMA, GR1DD, mild AI, calcified mitral annulus, mod MR, no atrial septal defect or patent foramen ovale   . Cognitive impairment   . CAD (coronary artery disease)   . HTN (hypertension)     Current Nutrition: nothing for breakfast this am as did not like food offered  Food/Nutrition-Related History: normal intake prior to admission   Scheduled Medications:  . sodium chloride   Intravenous Once  . albuterol  2.5 mg Nebulization Q6H  . amiodarone  200 mg Oral BID  . amLODipine  5 mg Oral Daily  . antiseptic oral rinse  7 mL Mouth Rinse BID  . antiseptic oral rinse  7 mL Mouth Rinse BID  . calcium carbonate  1 tablet Oral BID PC  . Chlorhexidine Gluconate Cloth  6 each Topical Q0600  . cinacalcet  60 mg Oral Q breakfast  . docusate  sodium  100 mg Oral BID  . ferrous sulfate  325 mg Oral Daily  . folic acid-vitamin b complex-vitamin c-selenium-zinc  1 tablet Oral Daily  . furosemide  80 mg Oral Daily  . insulin aspart  0-5 Units Subcutaneous QHS  . insulin aspart  0-9 Units Subcutaneous TID WC  . isosorbide mononitrate  30 mg Oral Daily  . metoprolol tartrate  12.5 mg Oral BID  . mometasone-formoterol  2 puff Inhalation BID  . mupirocin ointment  1 application Nasal BID  . pantoprazole  40 mg Oral Daily  . piperacillin-tazobactam (ZOSYN)  IV  3.375 g Intravenous Q12H  . sodium chloride  3 mL Intravenous Q12H  . [START ON 12/04/2014] vancomycin  750 mg Intravenous Once per day on Tue Thu Sat      Electrolyte/Renal Profile and Glucose Profile:   Recent Labs Lab 12/01/14 1132 12/02/14 0523 12/03/14 0419  NA 136 134* 132*  K 3.2* 3.1* 3.6  CL 99* 101 95*  CO2 27 26 27   BUN 7 12 17   CREATININE 2.75* 3.76* 4.52*  CALCIUM 8.3* 8.5* 8.6*  GLUCOSE 131* 95 131*   Protein Profile:  Recent Labs Lab 12/01/14 1132 12/02/14 0523 12/03/14 0419  ALBUMIN 3.0* 2.6* 2.9*    Gastrointestinal Profile: Last BM:11/13  Weight Change: 5% weight loss in the last month (unsure if fluid wt)    Diet Order:  Diet renal with fluid restriction Fluid restriction:: 1200 mL Fluid; Room service appropriate?: Yes; Fluid consistency:: Thin  Skin:   reviewed   Height:   Ht Readings from Last 1 Encounters:  12/01/14 5' 2.5" (1.588 m)    Weight:   Wt Readings from Last 1 Encounters:  12/02/14 176 lb 14.4 oz (80.241 kg)     BMI:  Body mass index is 31.82 kg/(m^2).   Kcals: [id number] kcals/d Using IBW of 50kg  Protein: 60-75 g/kg (1.2-1.5 g/kg)  Fluid: 1030ml + UOP   EDUCATION NEEDS:   No education needs identified at this time  [place]. Zenia Resides, [place], [place] (pager)

## 2014-12-03 NOTE — Op Note (Signed)
Operative Note     Preoperative diagnosis:   1. ESRD with bacteremia, possible permcath infection, pneumonia  Postoperative diagnosis:  1. ESRD with bacteremia, possible permcath infection, pneumonia  Procedure:  Removal of right jugular Permcath  Surgeon:  Leotis Pain, MD  Anesthesia:  Local  EBL:  Minimal  Indication for the Procedure:  The patient has bacteremia and has had her PermCath in for many months. Although she has an AV fistula could be used, she refuses to let this be accessed and she has been catheter dependent. She also has pneumonia, and it is unclear if the PermCath is the source of the bacteremia or the pneumonia. Nonetheless, with bacteremia the PermCath must be removed. Risks and benefits are discussed and informed consent is obtained.  Description of the Procedure:  The patient's right neck, chest and existing catheter were sterilely prepped and draped. The area around the catheter was anesthetized copiously with 1% lidocaine. The catheter was dissected out with curved hemostats until the cuff was freed from the surrounding fibrous sheath. The fiber sheath was transected, and the catheter was then removed in its entirety using gentle traction. Pressure was held and sterile dressings were placed. The patient tolerated the procedure well and was taken to the recovery room in stable condition.     DEW,JASON  12/03/2014, 4:57 PM

## 2014-12-03 NOTE — Progress Notes (Signed)
ANTIBIOTIC CONSULT NOTE - FOLLOW UP   Pharmacy Consult for Vancomycin and Zosyn Indication: pneumonia  Allergies  Allergen Reactions  . Contrast Media [Iodinated Diagnostic Agents] Other (See Comments)    Unknown reaction  . Morphine And Related Hives  . Other Rash    Blood Pressure Pill (Pt doesn't remember name)     Patient Measurements: Height: 5' 2.5" (158.8 cm) (stated) Weight: 176 lb 14.4 oz (80.241 kg) IBW/kg (Calculated) : 51.25 Adjusted Body Weight: 63.2  Vital Signs: Temp: 97.6 F (36.4 C) (11/14 0507) Temp Source: Oral (11/14 0507) BP: 158/91 mmHg (11/14 0507) Pulse Rate: 66 (11/14 0507) Intake/Output from previous day: 11/13 0701 - 11/14 0700 In: 430 [Blood:380; IV Piggyback:50] Out: 0  Intake/Output from this shift:    Labs:  Recent Labs  12/01/14 1132  12/01/14 1201 12/02/14 0523 12/02/14 1746 12/03/14 0419  WBC  --   --  23.8* 14.9*  --  10.6  HGB  --   < > 7.8* 6.4* 8.4* 8.4*  PLT  --   --  281 168  --  208  CREATININE 2.75*  --   --  3.76*  --  4.52*  < > = values in this interval not displayed.   Estimated Creatinine Clearance: 13.1 mL/min (by C-G formula based on Cr of 4.52).   Microbiology: Recent Results (from the past 720 hour(s))  Culture, blood (routine x 2)     Status: None (Preliminary result)   Collection Time: 12/01/14 11:34 AM  Result Value Ref Range Status   Specimen Description BLOOD LEFT ASSIST CONTROL  Final   Special Requests BAA,8ML,ANA,AER  Final   Culture  Setup Time   Final    GRAM POSITIVE COCCI IN BOTH AEROBIC AND ANAEROBIC BOTTLES CRITICAL RESULT CALLED TO, READ BACK BY AND VERIFIED WITH: JANE PHLUFMM AT [place] 12/02/14.PMH CONFIRMED BY [facility]    Culture   Final    GRAM POSITIVE COCCI IN BOTH AEROBIC AND ANAEROBIC BOTTLES IDENTIFICATION AND SUSCEPTIBILITIES TO FOLLOW    Report Status PENDING  Incomplete  Culture, blood (routine x 2)     Status: None (Preliminary result)   Collection Time: 12/01/14 12:01 PM   Result Value Ref Range Status   Specimen Description BLOOD LEFT ARM  Final   Special Requests BOTTLES DRAWN AEROBIC AND ANAEROBIC 5CC  Final   Culture  Setup Time   Final    GRAM POSITIVE COCCI IN BOTH AEROBIC AND ANAEROBIC BOTTLES CRITICAL RESULT CALLED TO, READ BACK BY AND VERIFIED WITH: JANE PHLUFMM AT [id number] 12/02/14.PMH CONFIRMED BY [facility]WW    Culture   Final    GRAM POSITIVE COCCI IN BOTH AEROBIC AND ANAEROBIC BOTTLES IDENTIFICATION AND SUSCEPTIBILITIES TO FOLLOW    Report Status PENDING  Incomplete  MRSA PCR Screening     Status: Abnormal   Collection Time: 12/01/14  3:17 PM  Result Value Ref Range Status   MRSA by PCR POSITIVE (A) NEGATIVE Final    Comment:        The GeneXpert MRSA Assay (FDA approved for NASAL specimens only), is one component of a comprehensive MRSA colonization surveillance program. It is not intended to diagnose MRSA infection nor to guide or monitor treatment for MRSA infections. CRITICAL RESULT CALLED TO, READ BACK BY AND VERIFIED WITH: MADDIE HIMES AT [id number] ON 12/01/14 BY [facility]     Medical History: Past Medical History  Diagnosis Date  . COPD (chronic obstructive pulmonary disease) ([place])   . ESRD on hemodialysis ([place])   .  DM2 (diabetes mellitus, type 2) ([place])   . Hepatitis C   . Migraine with aura   . HOH (hard of hearing)   . Mitral regurgitation     a. echo 06/2014: EF 60%, no WMA, GR1DD, mild AI, calcified mitral annulus, mod MR, no atrial septal defect or patent foramen ovale   . Cognitive impairment   . CAD (coronary artery disease)   . HTN (hypertension)     Medications:  Prescriptions prior to admission  Medication Sig Dispense Refill Last Dose  . albuterol (PROVENTIL HFA;VENTOLIN HFA) 108 (90 BASE) MCG/ACT inhaler Inhale 2 puffs into the lungs every 6 (six) hours as needed for wheezing or shortness of breath. (Patient taking differently: Inhale 2 puffs into the lungs every 4 (four) hours as needed for wheezing or shortness of  breath. ) 1 Inhaler 2   . albuterol (PROVENTIL) (2.5 MG/3ML) 0.083% nebulizer solution Take 2.5 mg by nebulization every 4 (four) hours.     Marland Kitchen amLODipine (NORVASC) 5 MG tablet Take 5 mg by mouth daily.   unknown  . calcium carbonate (TUMS - DOSED IN MG ELEMENTAL CALCIUM) 500 MG chewable tablet Chew 1 tablet by mouth 2 (two) times daily after a meal. Take 2 hours after meal.   unknown  . clopidogrel (PLAVIX) 75 MG tablet Take 75 mg by mouth daily.   unknown  . esomeprazole (NEXIUM) 40 MG capsule Take 40 mg by mouth daily.    unknown  . ferrous sulfate 325 (65 FE) MG tablet Take 325 mg by mouth daily.   unknown  . folic acid-vitamin b complex-vitamin c-selenium-zinc (DIALYVITE) 3 MG TABS tablet Take 1 tablet by mouth daily.   unknown  . furosemide (LASIX) 80 MG tablet Take 80 mg by mouth daily. Patient takes on non dialysis days.   unknown  . isosorbide mononitrate (IMDUR) 30 MG 24 hr tablet Take 30 mg by mouth daily.   unknown  . Lidocaine-Prilocaine, Bulk, A999333 % CREA 1 application by Other route as needed (for tx). Apply to site 20-45 minutes before tx.   unknown  . predniSONE (DELTASONE) 10 MG tablet 3 tab daily for 2 days 2 tab daily for 2 days 1 tab daily for 2 days 12 tablet 0   . sevelamer carbonate (RENVELA) 800 MG tablet Take 800 mg by mouth 3 (three) times daily with meals.     . diazepam (VALIUM) 2 MG tablet Take 1 tablet (2 mg total) by mouth every 8 (eight) hours as needed for muscle spasms. 15 tablet 0   . oxyCODONE (OXY IR/ROXICODONE) 5 MG immediate release tablet Take 1 tablet (5 mg total) by mouth every 4 (four) hours as needed for severe pain. 10 tablet 0   . traMADol (ULTRAM) 50 MG tablet Take 50 mg by mouth 2 (two) times daily as needed for moderate pain.   unknown   Scheduled:  . sodium chloride   Intravenous Once  . albuterol  2.5 mg Nebulization Q6H  . amiodarone  200 mg Oral BID  . amLODipine  5 mg Oral Daily  . antiseptic oral rinse  7 mL Mouth Rinse BID  .  antiseptic oral rinse  7 mL Mouth Rinse BID  . calcium carbonate  1 tablet Oral BID PC  . Chlorhexidine Gluconate Cloth  6 each Topical Q0600  . cinacalcet  60 mg Oral Q breakfast  . docusate sodium  100 mg Oral BID  . ferrous sulfate  325 mg Oral Daily  . folic acid-vitamin  b complex-vitamin c-selenium-zinc  1 tablet Oral Daily  . furosemide  80 mg Oral Daily  . insulin aspart  0-5 Units Subcutaneous QHS  . insulin aspart  0-9 Units Subcutaneous TID WC  . isosorbide mononitrate  30 mg Oral Daily  . metoprolol tartrate  12.5 mg Oral BID  . mometasone-formoterol  2 puff Inhalation BID  . mupirocin ointment  1 application Nasal BID  . pantoprazole  40 mg Oral Daily  . piperacillin-tazobactam (ZOSYN)  IV  3.375 g Intravenous Q12H  . sodium chloride  3 mL Intravenous Q12H  . [START ON 12/04/2014] vancomycin  750 mg Intravenous Once per day on Tue Thu Sat   Assessment: Patient with ESRD requiring hemodialysis per MD Bridgett Larsson H&P admitted for pneumonia.   Per RN Lovena Le, patient received hemodialysis as outpatient prior to being admitted on 11/12. Patient's normal hemodialysis schedule is [facility], Sat.   Goal of Therapy:  Vancomycin trough 15-25 mcg/ml  Plan:  Follow up culture results  Patient received Vancomycin 1500 mg IV x 1 loading dose on 11/12. Patient will need Vancomycin 750  Mg IV qHD (Tues, Thurs, and Sat).  Bcx x2: GPC + Patient has not had a HD session yet since receiving bolus.Per Nephrology note 11/13-next HD probably 11/15.  Anticipate ordering Vancomycin trough level prior to the likely Saturday 11/19 hemodialysis session-F/u in case of extra HD sessions.    Zosyn: Will continue Zosyn 3.375 g IV q12 hours   Chinita [place] PharmD Clinical Pharmacist 12/03/2014 2:26 PM

## 2014-12-03 NOTE — Progress Notes (Signed)
[facility] [place] NAME: Kelsey Peterson    MR#:  [id number]  DATE OF BIRTH:  August 28, 1955  SUBJECTIVE:   Patient here due to sepsis. Patient presented with fever and shortness of breath and chest x-ray findings suggestive of pneumonia. Patient now also noted to be bacteremic. Afebrile this morning and hemodynamically stable. having abdominal pain, nausea & vomiting.  Feels something stuck in her throat after vomiting and was requesting Kaopectate  REVIEW OF SYSTEMS:    Review of Systems  Constitutional: Negative for fever and chills.  HENT: Negative for congestion and tinnitus.   Eyes: Negative for blurred vision and double vision.  Respiratory: Negative for cough, shortness of breath and wheezing.   Cardiovascular: Negative for chest pain, orthopnea and PND.  Gastrointestinal: Positive for nausea, vomiting and abdominal pain. Negative for diarrhea.  Genitourinary: Negative for dysuria and hematuria.  Neurological: Negative for dizziness, sensory change and focal weakness.  All other systems reviewed and are negative.   Nutrition: Renal Tolerating Diet: Yes Tolerating PT: Await evaluation DRUG ALLERGIES:   Allergies  Allergen Reactions  . Contrast Media [Iodinated Diagnostic Agents] Other (See Comments)    Unknown reaction  . Morphine And Related Hives  . Other Rash    Blood Pressure Pill (Pt doesn't remember name)     VITALS:  Blood pressure 158/91, pulse 66, temperature 97.6 F (36.4 C), temperature source Oral, resp. rate 20, height 5' 2.5" (1.588 m), weight 80.241 kg (176 lb 14.4 oz), SpO2 95 %.  PHYSICAL EXAMINATION:  Physical Exam  GENERAL:  59 y.o.-year-old obese patient lying in the bed with no acute distress.  EYES: Pupils equal, round, reactive to light and accommodation. No scleral icterus. Extraocular muscles intact.  HEENT: Head atraumatic, normocephalic. Oropharynx and nasopharynx clear.  NECK:  Supple,  no jugular venous distention. No thyroid enlargement, no tenderness.  LUNGS: Normal breath sounds bilaterally, no wheezing, rales, rhonchi. No use of accessory muscles of respiration.  CARDIOVASCULAR: S1, S2 normal. No murmurs, rubs, or gallops.  ABDOMEN: Soft, nontender, nondistended. Bowel sounds present. No organomegaly or mass.  EXTREMITIES: No cyanosis, clubbing or edema b/l.    NEUROLOGIC: Cranial nerves II through XII are intact. No focal Motor or sensory deficits b/l. Globally weak  PSYCHIATRIC: The patient is alert and oriented x 2. Agitated at times.  SKIN: No obvious rash, lesion, or ulcer.   Right chest dialysis catheter in place. Removal of right jugular Permcath (11/14 by Dr Lucky Cowboy) and dressing in place & intact.   LABORATORY PANEL:   CBC  Recent Labs Lab 12/03/14 0419  WBC 10.6  HGB 8.4*  HCT 25.1*  PLT 208   ------------------------------------------------------------------------------------------------------------------  Chemistries   Recent Labs Lab 12/03/14 0419  NA 132*  K 3.6  CL 95*  CO2 27  GLUCOSE 131*  BUN 17  CREATININE 4.52*  CALCIUM 8.6*  AST 28  ALT 20  ALKPHOS 57  BILITOT 1.9*   ------------------------------------------------------------------------------------------------------------------  Cardiac Enzymes  Recent Labs Lab 12/01/14 1132  TROPONINI 0.62*   ASSESSMENT AND PLAN:   59 year old female with past medical history of end-stage renal disease on hemodialysis, COPD, type 2 diabetes, hepatitis C, coronary artery disease, cognitive impairment, who presented to the hospital due to fever and shortness of breath from dialysis.  #1 sepsis-patient meets criteria given her leukocytosis, fever, chest x-ray findings suggestive of pneumonia. -Continue IV vancomycin, Zosyn. Blood cultures are positive for gram-positive cocci but identification is pending. -Continue supportive  care, follow hemodynamics. - We will consult infectious  disease - Removal of right jugular Permcath (11/14) - Dr Lucky Cowboy  #2 pneumonia-likely source of patient's sepsis. Continue IV vancomycin, Zosyn. Await identification of the blood cultures ([facility]).  #3 cholelithiasis-patient had abdominal ultrasound which showed gallstones but no evidence of acute cholecystitis. -Appreciate surgical input and they recommended getting a gastroenterology consult. I do not suspect that the patient has cholangitis presently as patient's LFTs are normal and her bilirubins only marginally elevated. We'll follow LFTs and follow her clinically for now. - GI -> If LFT remains elevated, then order MRCP to evaluate CBD. If MRCP abnormal, will then consider ERCP later once pt is off plavix for 5 days and Pt's respiratory status more stable.  #4 end-stage renal disease on hemodialysis-nephrology has been consulted. -Continue dialysis on Tuesday Thursday Saturday schedule as per nephrology. Permcath(RT JUGULAR) removed by Dr Lucky Cowboy on 11/14  #5 secondary hyperparathyroidism-continue Sensipar  #6 hypertension-continue metoprolol, Imdur, Norvasc.  #7 COPD-no acute exacerbation. Continue Dulera, when necessary DuoNeb's.  #8 GERD-continue Protonix.  #9 type 2 diabetes-continue sliding scale insulin.  #10 anemia of chronic disease-likely secondary to end-stage renal disease. -Hemoglobin down to 6.4 on 11/13 - s/p 1 unit of PRBC transfusion and follow hemoglobin.  # 11 Throat pain: s/p vomit - will try maalox  All the records are reviewed and case discussed with Care Management/Social Workerr. Management plans discussed with the patient, family and they are in agreement.  CODE STATUS: Full  Discussed at length with Dr. Lucky Cowboy, patient's son - very poor prognosis.  We will consult palliative care  DVT Prophylaxis: Teds and SCDs  TOTAL TIME TAKING CARE OF THIS PATIENT: 30 minutes.   POSSIBLE D/C IN 2-3 DAYS, DEPENDING ON CLINICAL CONDITION.   [facility], Jairen Goldfarb M.D on 12/03/2014 at  5:08 PM  Between 7am to 6pm - Pager - [phone]  After 6pm go to [email or web address] - password [email or web address] [place] [facility]  Office  [phone]  CC: Primary care physician; Lavera Guise, MD

## 2014-12-03 NOTE — Consult Note (Signed)
[facility] Consult Note  MRN : [id number]  Kelsey Peterson is a 59 y.o. (Aug 13, 1955) female who presents with chief complaint of  Chief Complaint  Patient presents with  . Shortness of Breath  . Rectal Bleeding  .  History of Present Illness: Patient admitted 2 days ago with fever, pneumonia, shortness of breath. She had a MAXIMUM TEMPERATURE of 103. She has been found to be bacteremic on 2 sets of blood cultures. She has an indwelling PermCath that she will not let anyone use her functional right arm AV fistula. She actually reports that she was scheduled to have the fistula removed in [facility] and the next week or 2. She has been strongly advised to use the fistula to avoid these infectious complications and central venous occlusion from long-term catheter use, but she has been adamantly refusing this for months to years now. She still refuses to let anyone use her fistula at this time. We are consult with her bacteremia and fever as her PermCath will need to be removed. It is unclear if this is the primary source or if pneumonia was the primary source, but with bacteremia the nephrology service has contacted [place] for removal.  Current Facility-Administered Medications  Medication Dose Route Frequency Provider Last Rate Last Dose  . 0.9 %  sodium chloride infusion  250 mL Intravenous PRN Demetrios Loll, MD      . 0.9 %  sodium chloride infusion   Intravenous Once Max Sane, MD      . acetaminophen (TYLENOL) tablet 650 mg  650 mg Oral Q6H PRN Demetrios Loll, MD      . albuterol (PROVENTIL) (2.5 MG/3ML) 0.083% nebulizer solution 2.5 mg  2.5 mg Nebulization Q6H Vena Rua, RPH   2.5 mg at 12/03/14 1355  . amiodarone (PACERONE) tablet 200 mg  200 mg Oral BID Demetrios Loll, MD   200 mg at 12/03/14 1109  . amLODipine (NORVASC) tablet 5 mg  5 mg Oral Daily Demetrios Loll, MD   5 mg at 12/03/14 1109  . antiseptic oral rinse (CPC / CETYLPYRIDINIUM CHLORIDE 0.05%) solution 7  mL  7 mL Mouth Rinse BID Caryn Bee Himes, RN   7 mL at 12/03/14 1546  . antiseptic oral rinse (CPC / CETYLPYRIDINIUM CHLORIDE 0.05%) solution 7 mL  7 mL Mouth Rinse BID Caryn Bee Himes, RN   7 mL at 12/03/14 1546  . calcium carbonate (TUMS - dosed in mg elemental calcium) chewable tablet 200 mg of elemental calcium  1 tablet Oral BID PC Demetrios Loll, MD   200 mg of elemental calcium at 12/03/14 1123  . Chlorhexidine Gluconate Cloth 2 % PADS 6 each  6 each Topical Q0600 Henreitta Leber, MD   6 each at 12/03/14 0600  . cinacalcet (SENSIPAR) tablet 60 mg  60 mg Oral Q breakfast Demetrios Loll, MD   60 mg at 12/03/14 0829  . diazepam (VALIUM) tablet 2 mg  2 mg Oral Q8H PRN Demetrios Loll, MD      . docusate sodium (COLACE) capsule 100 mg  100 mg Oral BID Demetrios Loll, MD   100 mg at 12/03/14 1108  . ferrous sulfate tablet 325 mg  325 mg Oral Daily Demetrios Loll, MD   325 mg at 12/03/14 1107  . folic acid-vitamin b complex-vitamin c-selenium-zinc (DIALYVITE) tablet 1 tablet  1 tablet Oral Daily Demetrios Loll, MD   1 tablet at 12/01/14 1650  . furosemide (LASIX) tablet 80 mg  80 mg Oral Daily Demetrios Loll, MD   80 mg at 12/03/14 1109  . hydrALAZINE (APRESOLINE) injection 10 mg  10 mg Intravenous Q6H PRN Demetrios Loll, MD      . insulin aspart (novoLOG) injection 0-5 Units  0-5 Units Subcutaneous QHS Demetrios Loll, MD   0 Units at 12/01/14 2200  . insulin aspart (novoLOG) injection 0-9 Units  0-9 Units Subcutaneous TID WC Demetrios Loll, MD   0 Units at 12/01/14 1700  . isosorbide mononitrate (IMDUR) 24 hr tablet 30 mg  30 mg Oral Daily Demetrios Loll, MD   30 mg at 12/03/14 1107  . Lidocaine-Prilocaine (Bulk) A999333 % CREA 1 application  1 application Other PRN Demetrios Loll, MD      . metoprolol tartrate (LOPRESSOR) tablet 12.5 mg  12.5 mg Oral BID Demetrios Loll, MD   12.5 mg at 12/03/14 1108  . mometasone-formoterol (DULERA) 100-5 MCG/ACT inhaler 2 puff  2 puff Inhalation BID Demetrios Loll, MD   2 puff at 12/03/14 1303  . mupirocin ointment (BACTROBAN) 2 %  1 application  1 application Nasal BID Henreitta Leber, MD   1 application at 0000000 1110  . ondansetron (ZOFRAN) injection 4 mg  4 mg Intravenous Q6H PRN Demetrios Loll, MD   4 mg at 12/03/14 1301  . oxyCODONE (Oxy IR/ROXICODONE) immediate release tablet 5 mg  5 mg Oral Q4H PRN Demetrios Loll, MD   5 mg at 12/03/14 1544  . pantoprazole (PROTONIX) EC tablet 40 mg  40 mg Oral Daily Demetrios Loll, MD   40 mg at 12/03/14 1107  . piperacillin-tazobactam (ZOSYN) IVPB 3.375 g  3.375 g Intravenous Q12H Demetrios Loll, MD   3.375 g at 12/03/14 1113  . sodium chloride 0.9 % injection 3 mL  3 mL Intravenous Q12H Demetrios Loll, MD   3 mL at 12/03/14 1110  . sodium chloride 0.9 % injection 3 mL  3 mL Intravenous PRN Demetrios Loll, MD   3 mL at 12/03/14 1544  . traMADol (ULTRAM) tablet 50 mg  50 mg Oral BID PRN Demetrios Loll, MD      . Derrill Memo ON 12/04/2014] vancomycin (VANCOCIN) IVPB 750 mg/150 ml premix  750 mg Intravenous Once per day on Tue Thu Sat Henreitta Leber, MD        Past Medical History  Diagnosis Date  . COPD (chronic obstructive pulmonary disease) ([place])   . ESRD on hemodialysis ([place])   . DM2 (diabetes mellitus, type 2) ([place])   . Hepatitis C   . Migraine with aura   . HOH (hard of hearing)   . Mitral regurgitation     a. echo 06/2014: EF 60%, no WMA, GR1DD, mild AI, calcified mitral annulus, mod MR, no atrial septal defect or patent foramen ovale   . Cognitive impairment   . CAD (coronary artery disease)   . HTN (hypertension)     Past Surgical History  Procedure Laterality Date  . Dialysis fistula creation    . Tubal ligation    . Colonoscopy with propofol Left 07/09/2014    Procedure: COLONOSCOPY WITH PROPOFOL;  Surgeon: Hulen Luster, MD;  Location: [facility] ENDOSCOPY;  Service: Endoscopy;  Laterality: Left;  . Colonoscopy N/A 07/15/2014    Procedure: COLONOSCOPY;  Surgeon: Manya Silvas, MD;  Location: [facility] ENDOSCOPY;  Service: Endoscopy;  Laterality: N/A;    Social History Social History  Substance Use Topics   . Smoking status: Never Smoker   . Smokeless tobacco: Never Used  .  Alcohol Use: No  No IVDU  Family History Family History  Problem Relation Age of Onset  . Heart disease Mother   . Hypertension Mother   no bleeding or clotting disorders  Allergies  Allergen Reactions  . Contrast Media [Iodinated Diagnostic Agents] Other (See Comments)    Unknown reaction  . Morphine And Related Hives  . Other Rash    Blood Pressure Pill (Pt doesn't remember name)      REVIEW OF SYSTEMS (Negative unless checked)  Constitutional: [] Weight loss  [] Fever  [] Chills Cardiac: [] Chest pain   [] Chest pressure   [] Palpitations   [] Shortness of breath when laying flat   [] Shortness of breath at rest   [x] Shortness of breath with exertion. Vascular:  [] Pain in legs with walking   [] Pain in legs at rest   [] Pain in legs when laying flat   [] Claudication   [] Pain in feet when walking  [] Pain in feet at rest  [] Pain in feet when laying flat   [] History of DVT   [] Phlebitis   [] Swelling in legs   [] Varicose veins   [] Non-healing ulcers Pulmonary:   [] Uses home oxygen   [] Productive cough   [] Hemoptysis   [] Wheeze  [] COPD   [] Asthma Neurologic:  [] Dizziness  [] Blackouts   [] Seizures   [] History of stroke   [] History of TIA  [] Aphasia   [] Temporary blindness   [] Dysphagia   [] Weakness or numbness in arms   [] Weakness or numbness in legs Musculoskeletal:  [] Arthritis   [] Joint swelling   [] Joint pain   [] Low back pain Hematologic:  [] Easy bruising  [] Easy bleeding   [] Hypercoagulable state   [] Anemic  [] Hepatitis Gastrointestinal:  [] Blood in stool   [] Vomiting blood  [] Gastroesophageal reflux/heartburn   [] Difficulty swallowing. Genitourinary:  [x] Chronic kidney disease   [] Difficult urination  [] Frequent urination  [] Burning with urination   [] Blood in urine Skin:  [] Rashes   [] Ulcers   [] Wounds Psychological:  [] History of anxiety   []  History of major depression.  Physical Examination  Filed Vitals:    12/02/14 2109 12/03/14 0007 12/03/14 0250 12/03/14 0507  BP:  145/94  158/91  Pulse:  74  66  Temp:    97.6 F (36.4 C)  TempSrc:    Oral  Resp:    20  Height:      Weight:      SpO2: 100%  98% 99%   Body mass index is 31.82 kg/(m^2). Gen:  WD/WN, NAD Head: [place]/AT, No temporalis wasting. Prominent temp pulse not noted. Ear/Nose/Throat: Deaf, nares w/o erythema or drainage, oropharynx w/o Erythema/Exudate Eyes: PERRLA, EOMI.  Neck: Supple, no nuchal rigidity.  No JVD.  Pulmonary:  Good air movement, no use of accessory muscles.  Cardiac: RRR, normal S1, S2,  Vascular: right upper arm AVF with thrill and bruit. Catheter in right chest Vessel Right Left  Radial Palpable Palpable                                   Gastrointestinal: soft, non-tender/non-distended. No guarding/reflex. No masses, surgical incisions, or scars. Musculoskeletal: M/S 5/5 throughout.  Extremities without ischemic changes.  No deformity or atrophy. No edema. Neurologic: CN 2-12 intact. Pain and light touch intact in extremities.  Symmetrical.  Speech is fluent. Motor exam as listed above. Psychiatric: Judgment intact, Mood & affect appropriate for pt's clinical situation. Dermatologic: No rashes or ulcers noted.  No cellulitis or open wounds. Lymph :  No Cervical, Axillary, or Inguinal lymphadenopathy.      CBC Lab Results  Component Value Date   WBC 10.6 12/03/2014   HGB 8.4* 12/03/2014   HCT 25.1* 12/03/2014   MCV 83.1 12/03/2014   PLT 208 12/03/2014    BMET    Component Value Date/Time   NA 132* 12/03/2014 0419   NA 140 02/24/2014 0933   K 3.6 12/03/2014 0419   K 5.2* 02/24/2014 0933   CL 95* 12/03/2014 0419   CL 102 02/24/2014 0933   CO2 27 12/03/2014 0419   CO2 33* 02/24/2014 0933   GLUCOSE 131* 12/03/2014 0419   GLUCOSE 95 02/24/2014 0933   BUN 17 12/03/2014 0419   BUN 23* 02/24/2014 0933   CREATININE 4.52* 12/03/2014 0419   CREATININE 7.92* 02/24/2014 0933   CALCIUM 8.6*  12/03/2014 0419   CALCIUM 7.6* 02/24/2014 0933   GFRNONAA 10* 12/03/2014 0419   GFRNONAA 6* 02/24/2014 0933   GFRNONAA 14* 10/05/2013 1155   GFRAA 11* 12/03/2014 0419   GFRAA 7* 02/24/2014 0933   GFRAA 16* 10/05/2013 1155   Estimated Creatinine Clearance: 13.1 mL/min (by C-G formula based on Cr of 4.52).  COAG Lab Results  Component Value Date   INR 1.01 07/13/2014   INR 1.1 08/19/2013    Radiology Dg Chest Port 1 View  12/01/2014  CLINICAL DATA:  Dialysis patient, now extreme shortness of breath and fever. EXAM: PORTABLE CHEST 1 VIEW COMPARISON:  Chest CT dated 09/02/2014 and chest x-ray dated 09/01/2014. FINDINGS: The cardiomegaly appears stable. Again noted is central pulmonary vascular congestion and bilateral interstitial edema consistent with chronic/recurrent congestive heart failure. Dense opacity at the left lung base is slightly increased, consistent with a combination of consolidation and effusion seen on previous chest CT, presumably chronic. The right-sided dialysis catheter is stable in position. No pneumothorax. No acute osseous abnormality. IMPRESSION: 1. Cardiomegaly with central pulmonary vascular congestion and bilateral interstitial edema, not significantly changed in appearance compared to the previous study dated 09/01/2014, consistent with chronic/recurrent congestive heart failure. 2. Dense opacity at the left lung base is stable, or slightly increased, compared to the previous chest x-ray of 09/01/2014. On interval chest CT, this was due to a combination of consolidation (atelectasis versus pneumonia) and pleural effusion. Electronically Signed   By: Franki Cabot M.D.   On: 12/01/2014 12:26   US Abdomen Limited Ruq  12/01/2014  CLINICAL DATA:  Elevated bilirubin.  Known gallstones. EXAM: US ABDOMEN LIMITED - RIGHT UPPER QUADRANT COMPARISON:  CT 01/18/2014 FINDINGS: Gallbladder: Moderate cholelithiasis with larger stone measuring 2.3 cm. No significant wall  thickening as the wall measures 2.4 mm. Negative sonographic Murphy sign. No adjacent free fluid. Common bile duct: Diameter: 5 mm. Liver: No focal lesion identified. Within normal limits in parenchymal echogenicity. Incidental finding of significant right renal cortical thinning and increased echogenicity compatible with medical renal disease. IMPRESSION: Moderate cholelithiasis with the larger stone measuring 2.3 cm. No additional findings to suggest cholecystitis. Right renal findings suggesting medical renal disease. Electronically Signed   By: Marin Olp M.D.   On: 12/01/2014 18:03      Assessment/Plan 1. Bacteremia/PermCath infection/pneumonia. PermCath has to be removed in the face of bacteremia. Patient has a completely functional AV fistula which she refuses to let be used so I sent him she will need a temporary catheter in a day or 2 for a dialysis access. Infection will need to be cleared for 48-72 hours for consideration of placing a new PermCath, and  she may be running out of sites the PermCath can be placed. 2. Hypertension. Stable. 3. End-stage renal disease. See above. Very difficult since the patient will not let anyone use her functional fistula. It was discussed with her family today that long-term catheter dependence essentially halves her long-term survivalLeotis Pain, MD  12/03/2014 4:51 PM

## 2014-12-03 NOTE — Care Management Note (Signed)
Case Management Note  Patient Details  Name: Kelsey Peterson MRN: [id number] Date of Birth: July 31, 1955  Subjective/Objective:                 Patient presents from home with sepsis.  Patient lives at home alone.  Patient states that she lives in a single apartment.  Patient is an HD patient with her normal days being Tuesday, Thursday, Saturday.  Patient states that she uses a walker at home for ambulation. Patient's son is local and is a support system for her.  Patient uses [employer] transportation, and when it is not available her son transports her.  Patient uses chronic O2.  Patient states that she wears it at all times, and is requesting a small portable tank.  Her O2 is supplied through [place].  I spoke with Will the [employer] rep.  He states that the patient's previous order is for nocturnal O2 only.  Patient will need to qualify for continuous O2.  RN made aware   Action/Plan: Will need qualifying O2 sats for continuous O2  Expected Discharge Date:                  Expected Discharge Plan:     In-House Referral:     Discharge planning Services     Post Acute Care Choice:    Choice offered to:     DME Arranged:    DME Agency:     HH Arranged:    HH Agency:     Status of Service:     Medicare Important Message Given:  Yes Date Medicare IM Given:    Medicare IM give by:    Date Additional Medicare IM Given:    Additional Medicare Important Message give by:     If discussed at [place] of Stay Meetings, dates discussed:    Additional Comments:  Beverly Sessions, RN 12/03/2014, 1:13 PM

## 2014-12-03 NOTE — Care Management (Signed)
[facility] referral made

## 2014-12-04 ENCOUNTER — Encounter: Payer: Self-pay | Admitting: *Deleted

## 2014-12-04 LAB — CBC
HEMATOCRIT: 25 % — AB (ref 35.0–47.0)
HEMOGLOBIN: 8.1 g/dL — AB (ref 12.0–16.0)
MCH: 27 pg (ref 26.0–34.0)
MCHC: 32.6 g/dL (ref 32.0–36.0)
MCV: 82.6 fL (ref 80.0–100.0)
Platelets: 195 10*3/uL (ref 150–440)
RBC: 3.02 MIL/uL — AB (ref 3.80–5.20)
RDW: 23.9 % — AB (ref 11.5–14.5)
WBC: 8.8 10*3/uL (ref 3.6–11.0)

## 2014-12-04 LAB — CULTURE, BLOOD (ROUTINE X 2)

## 2014-12-04 LAB — HEPATIC FUNCTION PANEL
ALBUMIN: 2.6 g/dL — AB (ref 3.5–5.0)
ALT: 18 U/L (ref 14–54)
AST: 23 U/L (ref 15–41)
Alkaline Phosphatase: 55 U/L (ref 38–126)
BILIRUBIN INDIRECT: 0.8 mg/dL (ref 0.3–0.9)
Bilirubin, Direct: 0.6 mg/dL — ABNORMAL HIGH (ref 0.1–0.5)
TOTAL PROTEIN: 6.7 g/dL (ref 6.5–8.1)
Total Bilirubin: 1.4 mg/dL — ABNORMAL HIGH (ref 0.3–1.2)

## 2014-12-04 LAB — PHOSPHORUS: Phosphorus: 4.6 mg/dL (ref 2.5–4.6)

## 2014-12-04 LAB — BASIC METABOLIC PANEL
ANION GAP: 12 (ref 5–15)
BUN: 20 mg/dL (ref 6–20)
CALCIUM: 8.2 mg/dL — AB (ref 8.9–10.3)
CHLORIDE: 91 mmol/L — AB (ref 101–111)
CO2: 25 mmol/L (ref 22–32)
Creatinine, Ser: 5.31 mg/dL — ABNORMAL HIGH (ref 0.44–1.00)
GFR calc non Af Amer: 8 mL/min — ABNORMAL LOW (ref >60)
GFR, EST AFRICAN AMERICAN: 9 mL/min — AB (ref >60)
Glucose, Bld: 90 mg/dL (ref 65–99)
POTASSIUM: 3.2 mmol/L — AB (ref 3.5–5.1)
Sodium: 128 mmol/L — ABNORMAL LOW (ref 135–145)

## 2014-12-04 MED ORDER — LIDOCAINE-PRILOCAINE 2.5-2.5 % EX CREA
1.0000 "application " | TOPICAL_CREAM | CUTANEOUS | Status: DC | PRN
  Filled 2014-12-04 (×3): qty 5

## 2014-12-04 MED ORDER — ALTEPLASE 2 MG IJ SOLR
2.0000 mg | Freq: Once | INTRAMUSCULAR | Status: DC | PRN
  Filled 2014-12-04: qty 2

## 2014-12-04 MED ORDER — LIDOCAINE HCL (PF) 1 % IJ SOLN
5.0000 mL | INTRAMUSCULAR | Status: DC | PRN
  Administered 2014-12-06: 0.5 mL via INTRADERMAL
  Filled 2014-12-04 (×2): qty 5

## 2014-12-04 MED ORDER — SODIUM CHLORIDE 0.9 % IV SOLN
2.0000 g | INTRAVENOUS | Status: DC
  Filled 2014-12-04: qty 2000

## 2014-12-04 MED ORDER — ALBUTEROL SULFATE (2.5 MG/3ML) 0.083% IN NEBU
2.5000 mg | INHALATION_SOLUTION | Freq: Four times a day (QID) | RESPIRATORY_TRACT | Status: DC
  Administered 2014-12-04: 2.5 mg via RESPIRATORY_TRACT
  Filled 2014-12-04 (×2): qty 3

## 2014-12-04 MED ORDER — ALBUTEROL SULFATE (2.5 MG/3ML) 0.083% IN NEBU
2.5000 mg | INHALATION_SOLUTION | Freq: Four times a day (QID) | RESPIRATORY_TRACT | Status: DC | PRN
  Administered 2014-12-05: 2.5 mg via RESPIRATORY_TRACT
  Filled 2014-12-04: qty 3

## 2014-12-04 MED ORDER — HEPARIN SODIUM (PORCINE) 1000 UNIT/ML DIALYSIS
1000.0000 [IU] | INTRAMUSCULAR | Status: DC | PRN
  Filled 2014-12-04: qty 1

## 2014-12-04 MED ORDER — SODIUM CHLORIDE 0.9 % IV SOLN: 100.0000 mL | INTRAVENOUS | Status: DC | PRN

## 2014-12-04 MED ORDER — VANCOMYCIN HCL IN DEXTROSE 750-5 MG/150ML-% IV SOLN
750.0000 mg | INTRAVENOUS | Status: DC
  Administered 2014-12-06: 750 mg via INTRAVENOUS
  Filled 2014-12-04 (×2): qty 150

## 2014-12-04 MED ORDER — PENTAFLUOROPROP-TETRAFLUOROETH EX AERO
1.0000 "application " | INHALATION_SPRAY | CUTANEOUS | Status: DC | PRN
  Filled 2014-12-04: qty 30

## 2014-12-04 NOTE — Progress Notes (Signed)
Pt refuses CBG and insulin, states she is not diabetic, also refuses TUMS d/t no upper dentures

## 2014-12-04 NOTE — Progress Notes (Signed)
ANTIBIOTIC CONSULT NOTE - FOLLOW UP   Pharmacy Consult for Vancomycin and Zosyn Indication: pneumonia  Allergies  Allergen Reactions  . Contrast Media [Iodinated Diagnostic Agents] Other (See Comments)    Unknown reaction  . Morphine And Related Hives  . Other Rash    Blood Pressure Pill (Pt doesn't remember name)     Patient Measurements: Height: 5' 2.5" (158.8 cm) (stated) Weight: 176 lb 14.4 oz (80.241 kg) IBW/kg (Calculated) : 51.25 Adjusted Body Weight: 63.2  Vital Signs: Temp: 98.6 F (37 C) (11/15 1109) BP: 146/90 mmHg (11/15 1109) Pulse Rate: 60 (11/15 1109) Intake/Output from previous day: 11/14 0701 - 11/15 0700 In: 200 [IV Piggyback:200] Out: 0  Intake/Output from this shift:    Labs:  Recent Labs  12/02/14 0523 12/02/14 1746 12/03/14 0419 12/04/14 0410  WBC 14.9*  --  10.6 8.8  HGB 6.4* 8.4* 8.4* 8.1*  PLT 168  --  208 195  CREATININE 3.76*  --  4.52* 5.31*     Estimated Creatinine Clearance: 11.2 mL/min (by C-G formula based on Cr of 5.31).   Microbiology: Recent Results (from the past 720 hour(s))  Culture, blood (routine x 2)     Status: None   Collection Time: 12/01/14 11:34 AM  Result Value Ref Range Status   Specimen Description BLOOD LEFT ASSIST CONTROL  Final   Special Requests BAA,8ML,ANA,AER  Final   Culture  Setup Time   Final    GRAM POSITIVE COCCI IN BOTH AEROBIC AND ANAEROBIC BOTTLES CRITICAL RESULT CALLED TO, READ BACK BY AND VERIFIED WITH: JANE PHLUFMM AT [place] 12/02/14.PMH CONFIRMED BY RWW    Culture   Final    VIRIDANS STREPTOCOCCUS IN BOTH AEROBIC AND ANAEROBIC BOTTLES    Report Status 12/04/2014 FINAL  Final   Organism ID, Bacteria VIRIDANS STREPTOCOCCUS  Final      Susceptibility   Viridans streptococcus - MIC*    ERYTHROMYCIN Value in next row Resistant      RESISTANT>=8    TETRACYCLINE Value in next row Resistant      RESISTANT>=16    VANCOMYCIN Value in next row Sensitive      SENSITIVE0.5    CEFTRIAXONE  Value in next row Sensitive      SENSITIVE<=0.25    PENICILLIN Value in next row Sensitive      SENSITIVE0.12    LEVOFLOXACIN Value in next row Sensitive      SENSITIVE2    * VIRIDANS STREPTOCOCCUS  Culture, blood (routine x 2)     Status: None   Collection Time: 12/01/14 12:01 PM  Result Value Ref Range Status   Specimen Description BLOOD LEFT ARM  Final   Special Requests BOTTLES DRAWN AEROBIC AND ANAEROBIC 5CC  Final   Culture  Setup Time   Final    GRAM POSITIVE COCCI IN BOTH AEROBIC AND ANAEROBIC BOTTLES CRITICAL RESULT CALLED TO, READ BACK BY AND VERIFIED WITH: JANE PHLUFMM AT [place] 12/02/14.PMH CONFIRMED BY RWW    Culture   Final    VIRIDANS STREPTOCOCCUS IN BOTH AEROBIC AND ANAEROBIC BOTTLES    Report Status 12/04/2014 FINAL  Final   Organism ID, Bacteria VIRIDANS STREPTOCOCCUS  Final      Susceptibility   Viridans streptococcus - MIC*    ERYTHROMYCIN Value in next row Resistant      RESISTANT>=8    TETRACYCLINE Value in next row Resistant      RESISTANT>=16    VANCOMYCIN Value in next row Sensitive      SENSITIVE0.5  PENICILLIN Value in next row Sensitive      SENSITIVE0.12    CEFTRIAXONE Value in next row Sensitive      SENSITIVE0.25    LEVOFLOXACIN Value in next row Sensitive      SENSITIVE2    * VIRIDANS STREPTOCOCCUS  MRSA PCR Screening     Status: Abnormal   Collection Time: 12/01/14  3:17 PM  Result Value Ref Range Status   MRSA by PCR POSITIVE (A) NEGATIVE Final    Comment:        The GeneXpert MRSA Assay (FDA approved for NASAL specimens only), is one component of a comprehensive MRSA colonization surveillance program. It is not intended to diagnose MRSA infection nor to guide or monitor treatment for MRSA infections. CRITICAL RESULT CALLED TO, READ BACK BY AND VERIFIED WITH: MADDIE HIMES AT [id number] ON 12/01/14 BY [facility]   Cath Tip Culture     Status: None (Preliminary result)   Collection Time: 12/03/14  4:45 PM  Result Value Ref Range Status    Specimen Description CATH TIP  Final   Special Requests NONE  Final   Culture NO GROWTH < 24 HOURS  Final   Report Status PENDING  Incomplete    Medical History: Past Medical History  Diagnosis Date  . COPD (chronic obstructive pulmonary disease) ([place])   . ESRD on hemodialysis ([place])   . DM2 (diabetes mellitus, type 2) ([place])   . Hepatitis C   . Migraine with aura   . HOH (hard of hearing)   . Mitral regurgitation     a. echo 06/2014: EF 60%, no WMA, GR1DD, mild AI, calcified mitral annulus, mod MR, no atrial septal defect or patent foramen ovale   . Cognitive impairment   . CAD (coronary artery disease)   . HTN (hypertension)     Medications:  Prescriptions prior to admission  Medication Sig Dispense Refill Last Dose  . albuterol (PROVENTIL HFA;VENTOLIN HFA) 108 (90 BASE) MCG/ACT inhaler Inhale 2 puffs into the lungs every 6 (six) hours as needed for wheezing or shortness of breath. (Patient taking differently: Inhale 2 puffs into the lungs every 4 (four) hours as needed for wheezing or shortness of breath. ) 1 Inhaler 2   . albuterol (PROVENTIL) (2.5 MG/3ML) 0.083% nebulizer solution Take 2.5 mg by nebulization every 4 (four) hours.     Marland Kitchen amLODipine (NORVASC) 5 MG tablet Take 5 mg by mouth daily.   unknown  . calcium carbonate (TUMS - DOSED IN MG ELEMENTAL CALCIUM) 500 MG chewable tablet Chew 1 tablet by mouth 2 (two) times daily after a meal. Take 2 hours after meal.   unknown  . clopidogrel (PLAVIX) 75 MG tablet Take 75 mg by mouth daily.   unknown  . esomeprazole (NEXIUM) 40 MG capsule Take 40 mg by mouth daily.    unknown  . ferrous sulfate 325 (65 FE) MG tablet Take 325 mg by mouth daily.   unknown  . folic acid-vitamin b complex-vitamin c-selenium-zinc (DIALYVITE) 3 MG TABS tablet Take 1 tablet by mouth daily.   unknown  . furosemide (LASIX) 80 MG tablet Take 80 mg by mouth daily. Patient takes on non dialysis days.   unknown  . isosorbide mononitrate (IMDUR) 30 MG 24 hr tablet  Take 30 mg by mouth daily.   unknown  . Lidocaine-Prilocaine, Bulk, A999333 % CREA 1 application by Other route as needed (for tx). Apply to site 20-45 minutes before tx.   unknown  . predniSONE (DELTASONE) 10 MG tablet  3 tab daily for 2 days 2 tab daily for 2 days 1 tab daily for 2 days 12 tablet 0   . sevelamer carbonate (RENVELA) 800 MG tablet Take 800 mg by mouth 3 (three) times daily with meals.     . diazepam (VALIUM) 2 MG tablet Take 1 tablet (2 mg total) by mouth every 8 (eight) hours as needed for muscle spasms. 15 tablet 0   . oxyCODONE (OXY IR/ROXICODONE) 5 MG immediate release tablet Take 1 tablet (5 mg total) by mouth every 4 (four) hours as needed for severe pain. 10 tablet 0   . traMADol (ULTRAM) 50 MG tablet Take 50 mg by mouth 2 (two) times daily as needed for moderate pain.   unknown   Scheduled:  . sodium chloride   Intravenous Once  . albuterol  2.5 mg Nebulization Q6H WA  . amiodarone  200 mg Oral BID  . amLODipine  5 mg Oral Daily  . antiseptic oral rinse  7 mL Mouth Rinse BID  . antiseptic oral rinse  7 mL Mouth Rinse BID  . calcium carbonate  1 tablet Oral BID PC  . Chlorhexidine Gluconate Cloth  6 each Topical Q0600  . cinacalcet  60 mg Oral Q breakfast  . docusate sodium  100 mg Oral BID  . ferrous sulfate  325 mg Oral Daily  . folic acid-vitamin b complex-vitamin c-selenium-zinc  1 tablet Oral Daily  . furosemide  80 mg Oral Daily  . insulin aspart  0-5 Units Subcutaneous QHS  . insulin aspart  0-9 Units Subcutaneous TID WC  . isosorbide mononitrate  30 mg Oral Daily  . metoprolol tartrate  12.5 mg Oral BID  . mometasone-formoterol  2 puff Inhalation BID  . mupirocin ointment  1 application Nasal BID  . pantoprazole  40 mg Oral Daily  . piperacillin-tazobactam (ZOSYN)  IV  3.375 g Intravenous Q12H  . sodium chloride  3 mL Intravenous Q12H  . vancomycin  750 mg Intravenous Once per day on Tue Thu Sat   Assessment: Patient with ESRD requiring hemodialysis  per MD Bridgett Larsson H&P admitted for pneumonia, also with bacteremia  Per RN Lovena Le, patient received hemodialysis as outpatient prior to being admitted on 11/12. Patient's normal hemodialysis schedule is [facility], Sat.   Bcx+ ID consult  Goal of Therapy:  Vancomycin trough 15-25 mcg/ml  Plan:  Follow up culture results  Patient received Vancomycin 1500 mg IV x 1 loading dose on 11/12. Patient will need Vancomycin 750  Mg IV qHD (Tues, Thurs, and Sat).  Bcx x2: GPC +: Strep Viridans.  Patient has not had a HD session yet since receiving bolus.Per Nephrology note 11/13-next HD probably 11/15. No Nephrology note yet 11/15  Anticipate ordering Vancomycin trough level prior to the likely Saturday 11/19 hemodialysis session-F/u in case of extra HD sessions.   Zosyn: Will continue Zosyn 3.375 g IV q12 hours   Chinita Greenland PharmD Clinical Pharmacist 12/04/2014 12:20 PM

## 2014-12-04 NOTE — Consult Note (Signed)
[facility] Infectious [facility]     Reason for Consult:Viridans strep bacteremia    Referring Physician: Manuella Ghazi Date of Admission:  12/01/2014   Principal Problem:   Pneumonia Active Problems:   Sepsis ([place])   UTI (lower urinary tract infection)   Lactic acid acidosis   Cholelithiasis   HPI: BINA VEENSTRA is a 59 y.o. female with hypertension, diabetes, CAD, ESRD and COPD. Admitted from dialysis center 11/12 due to shortness of breath and fever She also had cough and shortness of breath  The patient has fever 103. Chest x-ray show left sided consolidation. WBC 24,000. She was treated with vancomycin and Zosyn in the ED. [place] + for viridans strep. Underwent removal of Permacath 11/14.  FU [facility] 11/15 pending. She has defervesced, wbc down from 23 -> 8.  TTE negative for vegetation. Denies recent dental work or infections.   Past Medical History  Diagnosis Date  . COPD (chronic obstructive pulmonary disease) ([place])   . ESRD on hemodialysis ([place])   . DM2 (diabetes mellitus, type 2) ([place])   . Hepatitis C   . Migraine with aura   . HOH (hard of hearing)   . Mitral regurgitation     a. echo 06/2014: EF 60%, no WMA, GR1DD, mild AI, calcified mitral annulus, mod MR, no atrial septal defect or patent foramen ovale   . Cognitive impairment   . CAD (coronary artery disease)   . HTN (hypertension)    Past Surgical History  Procedure Laterality Date  . Dialysis fistula creation    . Tubal ligation    . Colonoscopy with propofol Left 07/09/2014    Procedure: COLONOSCOPY WITH PROPOFOL;  Surgeon: Hulen Luster, MD;  Location: [facility] ENDOSCOPY;  Service: Endoscopy;  Laterality: Left;  . Colonoscopy N/A 07/15/2014    Procedure: COLONOSCOPY;  Surgeon: Manya Silvas, MD;  Location: [facility] ENDOSCOPY;  Service: Endoscopy;  Laterality: N/A;   Social History  Substance Use Topics  . Smoking status: Never Smoker   . Smokeless tobacco: Never Used  . Alcohol Use: No   Family History  Problem Relation  Age of Onset  . Heart disease Mother   . Hypertension Mother     Allergies:  Allergies  Allergen Reactions  . Contrast Media [Iodinated Diagnostic Agents] Other (See Comments)    Unknown reaction  . Morphine And Related Hives  . Other Rash    Blood Pressure Pill (Pt doesn't remember name)     Current antibiotics: Antibiotics Given (last 72 hours)    Date/Time Action Medication Dose Rate   12/01/14 1645 Given   vancomycin (VANCOCIN) 500 mg in sodium chloride 0.9 % 100 mL IVPB 500 mg 100 mL/hr   12/01/14 1751 Given   piperacillin-tazobactam (ZOSYN) IVPB 3.375 g 3.375 g 12.5 mL/hr   12/01/14 2340 Given   piperacillin-tazobactam (ZOSYN) IVPB 3.375 g 3.375 g 12.5 mL/hr   12/02/14 1048 Given   piperacillin-tazobactam (ZOSYN) IVPB 3.375 g 3.375 g 12.5 mL/hr   12/02/14 2200 Given   piperacillin-tazobactam (ZOSYN) IVPB 3.375 g 3.375 g 12.5 mL/hr   12/03/14 1113 Given   piperacillin-tazobactam (ZOSYN) IVPB 3.375 g 3.375 g 12.5 mL/hr   12/03/14 2235 Given   piperacillin-tazobactam (ZOSYN) IVPB 3.375 g 3.375 g 12.5 mL/hr   12/04/14 1128 Given   piperacillin-tazobactam (ZOSYN) IVPB 3.375 g 3.375 g 12.5 mL/hr      MEDICATIONS: . sodium chloride   Intravenous Once  . albuterol  2.5 mg Nebulization Q6H WA  . amiodarone  200  mg Oral BID  . amLODipine  5 mg Oral Daily  . ampicillin (OMNIPEN) IV  2 g Intravenous Q24H  . antiseptic oral rinse  7 mL Mouth Rinse BID  . antiseptic oral rinse  7 mL Mouth Rinse BID  . calcium carbonate  1 tablet Oral BID PC  . Chlorhexidine Gluconate Cloth  6 each Topical Q0600  . cinacalcet  60 mg Oral Q breakfast  . docusate sodium  100 mg Oral BID  . ferrous sulfate  325 mg Oral Daily  . folic acid-vitamin b complex-vitamin c-selenium-zinc  1 tablet Oral Daily  . furosemide  80 mg Oral Daily  . insulin aspart  0-5 Units Subcutaneous QHS  . insulin aspart  0-9 Units Subcutaneous TID WC  . isosorbide mononitrate  30 mg Oral Daily  . metoprolol tartrate   12.5 mg Oral BID  . mometasone-formoterol  2 puff Inhalation BID  . mupirocin ointment  1 application Nasal BID  . pantoprazole  40 mg Oral Daily  . sodium chloride  3 mL Intravenous Q12H    Review of Systems - 11 systems reviewed and negative per HPI   OBJECTIVE: Temp:  [97.5 F (36.4 C)-98.6 F (37 C)] 98.6 F (37 C) (11/15 1109) Pulse Rate:  [60-70] 60 (11/15 1109) Resp:  [18-20] 18 (11/15 1109) BP: (146-152)/(89-90) 146/90 mmHg (11/15 1109) SpO2:  [88 %-99 %] 99 % (11/15 1109) Physical Exam  Constitutional:  Obese, very HOH, difficult to understand speech HENT: [place]/AT, PERRLA, no scleral icterus Mouth/Throat: Oropharynx is clear and dry, has dentures. No oropharyngeal exudate.  Cardiovascular: Normal rate, 2/6 SM Pulmonary/Chest: mild rhonchi Neck  supple, no nuchal rigidity R chest wall with prior cath site Abdominal: Soft. obesity.  exhibits no distension. There is no tenderness.  Lymphadenopathy: no cervical adenopathy. No axillary adenopathy Neurological: diff to understand Skin: Skin is warm and dry. No rash noted. No erythema. No stigmata of endocarditis Psychiatric: a normal mood and affect.  behavior is normal.  R AVF site wnl  LABS: Results for orders placed or performed during the hospital encounter of 12/01/14 (from the past 48 hour(s))  Hemoglobin and hematocrit, blood     Status: Abnormal   Collection Time: 12/02/14  5:46 PM  Result Value Ref Range   Hemoglobin 8.4 (L) 12.0 - 16.0 g/dL   HCT 26.8 (L) 35.0 - 47.0 %  Comprehensive metabolic panel     Status: Abnormal   Collection Time: 12/03/14  4:19 AM  Result Value Ref Range   Sodium 132 (L) 135 - 145 mmol/L   Potassium 3.6 3.5 - 5.1 mmol/L   Chloride 95 (L) 101 - 111 mmol/L   CO2 27 22 - 32 mmol/L   Glucose, Bld 131 (H) 65 - 99 mg/dL   BUN 17 6 - 20 mg/dL   Creatinine, Ser 4.52 (H) 0.44 - 1.00 mg/dL   Calcium 8.6 (L) 8.9 - 10.3 mg/dL   Total Protein 7.0 6.5 - 8.1 g/dL   Albumin 2.9 (L) 3.5 - 5.0  g/dL   AST 28 15 - 41 U/L   ALT 20 14 - 54 U/L   Alkaline Phosphatase 58 38 - 126 U/L   Total Bilirubin 1.9 (H) 0.3 - 1.2 mg/dL   GFR calc non Af Amer 10 (L) >60 mL/min   GFR calc Af Amer 11 (L) >60 mL/min    Comment: (NOTE) The eGFR has been calculated using the CKD EPI equation. This calculation has not been validated in all clinical  situations. eGFR's persistently <60 mL/min signify possible Chronic Kidney Disease.    Anion gap 10 5 - 15  CBC     Status: Abnormal   Collection Time: 12/03/14  4:19 AM  Result Value Ref Range   WBC 10.6 3.6 - 11.0 K/uL   RBC 3.02 (L) 3.80 - 5.20 MIL/uL   Hemoglobin 8.4 (L) 12.0 - 16.0 g/dL   HCT 25.1 (L) 35.0 - 47.0 %   MCV 83.1 80.0 - 100.0 fL   MCH 27.9 26.0 - 34.0 pg   MCHC 33.6 32.0 - 36.0 g/dL   RDW 23.5 (H) 11.5 - 14.5 %   Platelets 208 150 - 440 K/uL  Cath Tip Culture     Status: None (Preliminary result)   Collection Time: 12/03/14  4:45 PM  Result Value Ref Range   Specimen Description CATH TIP    Special Requests NONE    Culture NO GROWTH < 24 HOURS    Report Status PENDING   Hepatic function panel     Status: Abnormal   Collection Time: 12/04/14  4:10 AM  Result Value Ref Range   Total Protein 6.7 6.5 - 8.1 g/dL   Albumin 2.6 (L) 3.5 - 5.0 g/dL   AST 23 15 - 41 U/L   ALT 18 14 - 54 U/L   Alkaline Phosphatase 55 38 - 126 U/L   Total Bilirubin 1.4 (H) 0.3 - 1.2 mg/dL   Bilirubin, Direct 0.6 (H) 0.1 - 0.5 mg/dL   Indirect Bilirubin 0.8 0.3 - 0.9 mg/dL  CBC     Status: Abnormal   Collection Time: 12/04/14  4:10 AM  Result Value Ref Range   WBC 8.8 3.6 - 11.0 K/uL   RBC 3.02 (L) 3.80 - 5.20 MIL/uL   Hemoglobin 8.1 (L) 12.0 - 16.0 g/dL   HCT 25.0 (L) 35.0 - 47.0 %   MCV 82.6 80.0 - 100.0 fL   MCH 27.0 26.0 - 34.0 pg   MCHC 32.6 32.0 - 36.0 g/dL   RDW 23.9 (H) 11.5 - 14.5 %   Platelets 195 150 - 440 K/uL  Basic metabolic panel     Status: Abnormal   Collection Time: 12/04/14  4:10 AM  Result Value Ref Range   Sodium 128  (L) 135 - 145 mmol/L   Potassium 3.2 (L) 3.5 - 5.1 mmol/L   Chloride 91 (L) 101 - 111 mmol/L   CO2 25 22 - 32 mmol/L   Glucose, Bld 90 65 - 99 mg/dL   BUN 20 6 - 20 mg/dL   Creatinine, Ser 5.31 (H) 0.44 - 1.00 mg/dL   Calcium 8.2 (L) 8.9 - 10.3 mg/dL   GFR calc non Af Amer 8 (L) >60 mL/min   GFR calc Af Amer 9 (L) >60 mL/min    Comment: (NOTE) The eGFR has been calculated using the CKD EPI equation. This calculation has not been validated in all clinical situations. eGFR's persistently <60 mL/min signify possible Chronic Kidney Disease.    Anion gap 12 5 - 15  Phosphorus     Status: None   Collection Time: 12/04/14  4:10 AM  Result Value Ref Range   Phosphorus 4.6 2.5 - 4.6 mg/dL   No components found for: ESR, C REACTIVE PROTEIN MICRO: Recent Results (from the past 720 hour(s))  Culture, blood (routine x 2)     Status: None   Collection Time: 12/01/14 11:34 AM  Result Value Ref Range Status   Specimen Description BLOOD LEFT ASSIST CONTROL  Final   Special Requests BAA,8ML,ANA,AER  Final   Culture  Setup Time   Final    GRAM POSITIVE COCCI IN BOTH AEROBIC AND ANAEROBIC BOTTLES CRITICAL RESULT CALLED TO, READ BACK BY AND VERIFIED WITH: JANE PHLUFMM AT [facility] 12/02/14.PMH CONFIRMED BY RWW    Culture   Final    VIRIDANS STREPTOCOCCUS IN BOTH AEROBIC AND ANAEROBIC BOTTLES    Report Status 12/04/2014 FINAL  Final   Organism ID, Bacteria VIRIDANS STREPTOCOCCUS  Final      Susceptibility   Viridans streptococcus - MIC*    ERYTHROMYCIN Value in next row Resistant      RESISTANT>=8    TETRACYCLINE Value in next row Resistant      RESISTANT>=16    VANCOMYCIN Value in next row Sensitive      SENSITIVE0.5    CEFTRIAXONE Value in next row Sensitive      SENSITIVE<=0.25    PENICILLIN Value in next row Sensitive      SENSITIVE0.12    LEVOFLOXACIN Value in next row Sensitive      SENSITIVE2    * VIRIDANS STREPTOCOCCUS  Culture, blood (routine x 2)     Status: None   Collection  Time: 12/01/14 12:01 PM  Result Value Ref Range Status   Specimen Description BLOOD LEFT ARM  Final   Special Requests BOTTLES DRAWN AEROBIC AND ANAEROBIC 5CC  Final   Culture  Setup Time   Final    GRAM POSITIVE COCCI IN BOTH AEROBIC AND ANAEROBIC BOTTLES CRITICAL RESULT CALLED TO, READ BACK BY AND VERIFIED WITH: JANE PHLUFMM AT [place] 12/02/14.PMH CONFIRMED BY RWW    Culture   Final    VIRIDANS STREPTOCOCCUS IN BOTH AEROBIC AND ANAEROBIC BOTTLES    Report Status 12/04/2014 FINAL  Final   Organism ID, Bacteria VIRIDANS STREPTOCOCCUS  Final      Susceptibility   Viridans streptococcus - MIC*    ERYTHROMYCIN Value in next row Resistant      RESISTANT>=8    TETRACYCLINE Value in next row Resistant      RESISTANT>=16    VANCOMYCIN Value in next row Sensitive      SENSITIVE0.5    PENICILLIN Value in next row Sensitive      SENSITIVE0.12    CEFTRIAXONE Value in next row Sensitive      SENSITIVE0.25    LEVOFLOXACIN Value in next row Sensitive      SENSITIVE2    * VIRIDANS STREPTOCOCCUS  MRSA PCR Screening     Status: Abnormal   Collection Time: 12/01/14  3:17 PM  Result Value Ref Range Status   MRSA by PCR POSITIVE (A) NEGATIVE Final    Comment:        The GeneXpert MRSA Assay (FDA approved for NASAL specimens only), is one component of a comprehensive MRSA colonization surveillance program. It is not intended to diagnose MRSA infection nor to guide or monitor treatment for MRSA infections. CRITICAL RESULT CALLED TO, READ BACK BY AND VERIFIED WITH: MADDIE HIMES AT 9326 ON 12/01/14 BY [facility]   Cath Tip Culture     Status: None (Preliminary result)   Collection Time: 12/03/14  4:45 PM  Result Value Ref Range Status   Specimen Description CATH TIP  Final   Special Requests NONE  Final   Culture NO GROWTH < 24 HOURS  Final   Report Status PENDING  Incomplete    IMAGING: Dg Chest Port 1 View  12/01/2014  CLINICAL DATA:  Dialysis patient, now extreme shortness of  breath and  fever. EXAM: PORTABLE CHEST 1 VIEW COMPARISON:  Chest CT dated 09/02/2014 and chest x-ray dated 09/01/2014. FINDINGS: The cardiomegaly appears stable. Again noted is central pulmonary vascular congestion and bilateral interstitial edema consistent with chronic/recurrent congestive heart failure. Dense opacity at the left lung base is slightly increased, consistent with a combination of consolidation and effusion seen on previous chest CT, presumably chronic. The right-sided dialysis catheter is stable in position. No pneumothorax. No acute osseous abnormality. IMPRESSION: 1. Cardiomegaly with central pulmonary vascular congestion and bilateral interstitial edema, not significantly changed in appearance compared to the previous study dated 09/01/2014, consistent with chronic/recurrent congestive heart failure. 2. Dense opacity at the left lung base is stable, or slightly increased, compared to the previous chest x-ray of 09/01/2014. On interval chest CT, this was due to a combination of consolidation (atelectasis versus pneumonia) and pleural effusion. Electronically Signed   By: Franki Cabot M.D.   On: 12/01/2014 12:26   US Abdomen Limited Ruq  12/01/2014  CLINICAL DATA:  Elevated bilirubin.  Known gallstones. EXAM: US ABDOMEN LIMITED - RIGHT UPPER QUADRANT COMPARISON:  CT 01/18/2014 FINDINGS: Gallbladder: Moderate cholelithiasis with larger stone measuring 2.3 cm. No significant wall thickening as the wall measures 2.4 mm. Negative sonographic Murphy sign. No adjacent free fluid. Common bile duct: Diameter: 5 mm. Liver: No focal lesion identified. Within normal limits in parenchymal echogenicity. Incidental finding of significant right renal cortical thinning and increased echogenicity compatible with medical renal disease. IMPRESSION: Moderate cholelithiasis with the larger stone measuring 2.3 cm. No additional findings to suggest cholecystitis. Right renal findings suggesting medical renal disease.  Electronically Signed   By: Marin Olp M.D.   On: 12/01/2014 18:03   Echo Study Conclusions  - Left ventricle: Systolic function was mildly reduced. The estimated ejection fraction was 45%. Regional wall motion abnormalities cannot be excluded. Doppler parameters are consistent with abnormal left ventricular relaxation (grade 1 diastolic dysfunction). - Ventricular septum: Septal motion showed abnormal function and dyssynergy. - Aortic valve: There was trivial regurgitation. Valve area (Vmax): 4.29 cm^2. - Mitral valve: There was moderate regurgitation. - Left atrium: The atrium was mildly dilated. - Right ventricle: The cavity size was mildly dilated. - Right atrium: The atrium was mildly dilated. - Atrial septum: No defect or patent foramen ovale was identified. - Tricuspid valve: There was severe regurgitation. - Pericardium, extracardiac: A trivial pericardial effusion was identified. There was a left pleural effusion.  Impressions:  - There was no evidence of a vegetation. There is no vegetations seen on mitral,tricuspid, aortic or pulmonic valves. But if blood cultures are positive consider TEE. There is left pleural effusion and septal hypokinesis suggestive of CAD.  Assessment:   MAKHAYLA MCMURRY is a 59 y.o. female with ESRD with HD through [place] (although also has a functioning AVF) admitted with fevers, leukocytosis and found to have viridans strep bacteremia. S/p removal of cath 11/14, bcx neg 11/15. TTE negative  Recommendations FU repeat bcx to document clearance Would suggest 4 weeks of IV therapy for the bacteremia given AVF in place and potential to have seeded it. This can be given with vanco so can be given at HD for ease of dosing. Following completion of 4 weeks treatment - from date of first neg bcx - would repeat bcx as subsequent HD sessions x 2 to ensure clearance No need for TEE since will treat 4 weeks either way. Thank you  very much for allowing me to participate in the care of  this patient. Please call with questions.   Cheral Marker. Ola Spurr, MD

## 2014-12-04 NOTE — Care Management Note (Signed)
Patient is active at outpatient dialysis. [place]  On TTS.  I will update clinic with additional medical records at discharge.  Iran Sizer  Dialysis Liaison  [phone]

## 2014-12-04 NOTE — Progress Notes (Signed)
   12/03/14 1658  Oxygen Therapy  SpO2 (!) 88 %  O2 Device Room Air (at rest)

## 2014-12-04 NOTE — Progress Notes (Signed)
Kelsey Peterson at [place] NAME: Kelsey Peterson    MR#:  [id number]  DATE OF BIRTH:  February 01, 1955  SUBJECTIVE:  Patient here due to sepsis. Patient presented with fever and shortness of breath and chest x-ray findings suggestive of pneumonia. Patient now also noted to be bacteremic. Afebrile this morning and hemodynamically stable. Still feels something stuck in her throat   REVIEW OF SYSTEMS:    Review of Systems  Constitutional: Negative for fever and chills.  HENT: Negative for congestion and tinnitus.   Eyes: Negative for blurred vision and double vision.  Respiratory: Negative for cough, shortness of breath and wheezing.   Cardiovascular: Negative for chest pain, orthopnea and PND.  Gastrointestinal: Positive for nausea. Negative for diarrhea.  Genitourinary: Negative for dysuria and hematuria.  Neurological: Negative for dizziness, sensory change and focal weakness.  All other systems reviewed and are negative.   Nutrition: Renal Tolerating Diet: Yes Tolerating PT: Await evaluation DRUG ALLERGIES:   Allergies  Allergen Reactions  . Contrast Media [Iodinated Diagnostic Agents] Other (See Comments)    Unknown reaction  . Morphine And Related Hives  . Other Rash    Blood Pressure Pill (Pt doesn't remember name)     VITALS:  Blood pressure 146/90, pulse 60, temperature 98.6 F (37 C), temperature source Oral, resp. rate 18, height 5' 2.5" (1.588 m), weight 80.241 kg (176 lb 14.4 oz), SpO2 99 %. PHYSICAL EXAMINATION:  Physical Exam  GENERAL:  59 y.o.-year-old obese patient lying in the bed with no acute distress.  EYES: Pupils equal, round, reactive to light and accommodation. No scleral icterus. Extraocular muscles intact.  HEENT: Head atraumatic, normocephalic. Oropharynx and nasopharynx clear.  NECK:  Supple, no jugular venous distention. No thyroid enlargement, no tenderness.  LUNGS: Normal breath sounds bilaterally, no  wheezing, rales, rhonchi. No use of accessory muscles of respiration.  CARDIOVASCULAR: S1, S2 normal. No murmurs, rubs, or gallops.  ABDOMEN: Soft, nontender, nondistended. Bowel sounds present. No organomegaly or mass.  EXTREMITIES: No cyanosis, clubbing or edema b/l.    NEUROLOGIC: Cranial nerves II through XII are intact. No focal Motor or sensory deficits b/l. Globally weak  PSYCHIATRIC: The patient is alert and oriented x 2. Agitated at times.  SKIN: No obvious rash, lesion, or ulcer.  Right chest dialysis catheter in place. Removal of right jugular Permcath (11/14 by Dr Kelsey Peterson) and dressing in place & intact.   LABORATORY PANEL:   CBC  Recent Labs Lab 12/04/14 0410  WBC 8.8  HGB 8.1*  HCT 25.0*  PLT 195   ------------------------------------------------------------------------------------------------------------------  Chemistries   Recent Labs Lab 12/04/14 0410  NA 128*  K 3.2*  CL 91*  CO2 25  GLUCOSE 90  BUN 20  CREATININE 5.31*  CALCIUM 8.2*  AST 23  ALT 18  ALKPHOS 55  BILITOT 1.4*   ------------------------------------------------------------------------------------------------------------------  Cardiac Enzymes  Recent Labs Lab 12/01/14 1132  TROPONINI 0.62*   ASSESSMENT AND PLAN:   59 year old female with past medical history of end-stage renal disease on hemodialysis, COPD, type 2 diabetes, hepatitis C, coronary artery disease, cognitive impairment, who presented to the hospital due to fever and shortness of breath from dialysis.  #1 sepsis-patient meets criteria given her leukocytosis, fever, chest x-ray findings suggestive of pneumonia. -Continue IV vancomycin, Zosyn. Blood cultures are growing Strep Viridans - we will narrow antibiotic based on sensitivities -Continue supportive care, follow hemodynamics. - Pending consult to infectious disease - Removal of right jugular Permcath (  11/14) - Dr Kelsey Peterson We will get repeat blood culture to see if they  are negative, in which case may be further PermCath can be placed for long-term dialysis if family wishes  #2 pneumonia-likely source of patient's sepsis. Continue IV vancomycin, Zosyn.   #3 cholelithiasis-patient had abdominal ultrasound which showed gallstones but no evidence of acute cholecystitis. -Appreciate surgical input and they recommended getting a gastroenterology consult. I do not suspect that the patient has cholangitis presently as patient's LFTs are normal and her bilirubins only marginally elevated. We'll follow LFTs and follow her clinically for now. - GI -> If LFT remains elevated, then order MRCP to evaluate CBD. If MRCP abnormal, will then consider ERCP later once pt is off plavix for 5 days and Pt's respiratory status more stable.  #4 end-stage renal disease on hemodialysis-nephrology has been consulted. -Continue dialysis on Tuesday Thursday Saturday schedule as per nephrology. Permcath(RT JUGULAR) removed by Dr Kelsey Peterson on 11/14  #5 secondary hyperparathyroidism-continue Sensipar  #6 hypertension-continue metoprolol, Imdur, Norvasc.  #7 COPD-no acute exacerbation. Continue Dulera, when necessary DuoNeb's.  #8 GERD-continue Protonix.  #9 type 2 diabetes-continue sliding scale insulin.  #10 anemia of chronic disease-likely secondary to end-stage renal disease. -Hemoglobin down to 6.4 on 11/13 - s/p 1 unit of PRBC transfusion and follow hemoglobin. It's 8.1 today  # 11 Throat pain: still feels same, will ask ST to eval her  All the records are reviewed and case discussed with Care Management/Social Workerr. Management plans discussed with the patient, family and they are in agreement.  CODE STATUS: Full  consult palliative care  DVT Prophylaxis: Teds and SCDs  TOTAL TIME TAKING CARE OF THIS PATIENT: 30 minutes.   POSSIBLE D/C IN 2-3 DAYS, DEPENDING ON CLINICAL CONDITION.   [facility], Kelsey Peterson M.D on 12/04/2014 at 2:55 PM  Between 7am to 6pm - Pager -  [phone]  After 6pm go to [email or web address] - password [email or web address]  Office  [phone]  CC: Primary care physician; Kelsey Guise, MD

## 2014-12-04 NOTE — Progress Notes (Signed)
[facility]  ROUNDING NOTE   Subjective:  PermCath has been removed. Patient has been resting comfortably. Streptococcus viridans noted in the blood.   Objective:  Vital signs in last 24 hours:  Temp:  [97.5 F (36.4 C)-98.6 F (37 C)] 98.6 F (37 C) (11/15 1109) Pulse Rate:  [60-70] 60 (11/15 1109) Resp:  [18-20] 18 (11/15 1109) BP: (146-152)/(89-90) 146/90 mmHg (11/15 1109) SpO2:  [88 %-99 %] 99 % (11/15 1109)  Weight change:  Filed Weights   12/01/14 1100 12/01/14 1500 12/02/14 0352  Weight: 83.915 kg (185 lb) 79.334 kg (174 lb 14.4 oz) 80.241 kg (176 lb 14.4 oz)    Intake/Output: I/O last 3 completed shifts: In: 200 [IV Piggyback:200] Out: 0    Intake/Output this shift:     Physical Exam: General: NAD, sitting up in bed  Head: Normocephalic, atraumatic. Moist oral mucosal membranes  Eyes: Anicteric  Neck: Supple, trachea midline  Lungs:  Clear to auscultation  Heart: Regular rate and rhythm  Abdomen:  Soft, nontender, BS present  Extremities:  2+ peripheral edema.  Neurologic: Nonfocal, moving all four extremities  Skin: No lesions  Access: R IJ permcath    Basic Metabolic Panel:  Recent Labs Lab 12/01/14 1132 12/02/14 0523 12/03/14 0419 12/04/14 0410  NA 136 134* 132* 128*  K 3.2* 3.1* 3.6 3.2*  CL 99* 101 95* 91*  CO2 27 26 27 25   GLUCOSE 131* 95 131* 90  BUN 7 12 17 20   CREATININE 2.75* 3.76* 4.52* 5.31*  CALCIUM 8.3* 8.5* 8.6* 8.2*  PHOS  --   --   --  4.6    Liver Function Tests:  Recent Labs Lab 12/01/14 1132 12/02/14 0523 12/03/14 0419 12/04/14 0410  AST 34 24 28 23   ALT 19 16 20 18   ALKPHOS 76 56 58 55  BILITOT 2.7* 2.3* 1.9* 1.4*  PROT 7.6 6.1* 7.0 6.7  ALBUMIN 3.0* 2.6* 2.9* 2.6*    Recent Labs Lab 12/01/14 1132  LIPASE 25   No results for input(s): AMMONIA in the last 168 hours.  CBC:  Recent Labs Lab 12/01/14 1201 12/02/14 0523 12/02/14 1746 12/03/14 0419 12/04/14 0410  WBC 23.8* 14.9*  --   10.6 8.8  HGB 7.8* 6.4* 8.4* 8.4* 8.1*  HCT 24.6* 21.3* 26.8* 25.1* 25.0*  MCV 81.4 84.4  --  83.1 82.6  PLT 281 168  --  208 195    Cardiac Enzymes:  Recent Labs Lab 12/01/14 1132  TROPONINI 0.62*    BNP: Invalid input(s): POCBNP  CBG: No results for input(s): GLUCAP in the last 168 hours.  Microbiology: Results for orders placed or performed during the hospital encounter of 12/01/14  Culture, blood (routine x 2)     Status: None   Collection Time: 12/01/14 11:34 AM  Result Value Ref Range Status   Specimen Description BLOOD LEFT ASSIST CONTROL  Final   Special Requests BAA,8ML,ANA,AER  Final   Culture  Setup Time   Final    GRAM POSITIVE COCCI IN BOTH AEROBIC AND ANAEROBIC BOTTLES CRITICAL RESULT CALLED TO, READ BACK BY AND VERIFIED WITH: JANE PHLUFMM AT [place] 12/02/14.PMH CONFIRMED BY RWW    Culture   Final    VIRIDANS STREPTOCOCCUS IN BOTH AEROBIC AND ANAEROBIC BOTTLES    Report Status 12/04/2014 FINAL  Final   Organism ID, Bacteria VIRIDANS STREPTOCOCCUS  Final      Susceptibility   Viridans streptococcus - MIC*    ERYTHROMYCIN Value in next row Resistant  RESISTANT>=8    TETRACYCLINE Value in next row Resistant      RESISTANT>=16    VANCOMYCIN Value in next row Sensitive      SENSITIVE0.5    CEFTRIAXONE Value in next row Sensitive      SENSITIVE<=0.25    PENICILLIN Value in next row Sensitive      SENSITIVE0.12    LEVOFLOXACIN Value in next row Sensitive      SENSITIVE2    * VIRIDANS STREPTOCOCCUS  Culture, blood (routine x 2)     Status: None   Collection Time: 12/01/14 12:01 PM  Result Value Ref Range Status   Specimen Description BLOOD LEFT ARM  Final   Special Requests BOTTLES DRAWN AEROBIC AND ANAEROBIC 5CC  Final   Culture  Setup Time   Final    GRAM POSITIVE COCCI IN BOTH AEROBIC AND ANAEROBIC BOTTLES CRITICAL RESULT CALLED TO, READ BACK BY AND VERIFIED WITH: JANE PHLUFMM AT [place] 12/02/14.PMH CONFIRMED BY RWW    Culture   Final     VIRIDANS STREPTOCOCCUS IN BOTH AEROBIC AND ANAEROBIC BOTTLES    Report Status 12/04/2014 FINAL  Final   Organism ID, Bacteria VIRIDANS STREPTOCOCCUS  Final      Susceptibility   Viridans streptococcus - MIC*    ERYTHROMYCIN Value in next row Resistant      RESISTANT>=8    TETRACYCLINE Value in next row Resistant      RESISTANT>=16    VANCOMYCIN Value in next row Sensitive      SENSITIVE0.5    PENICILLIN Value in next row Sensitive      SENSITIVE0.12    CEFTRIAXONE Value in next row Sensitive      SENSITIVE0.25    LEVOFLOXACIN Value in next row Sensitive      SENSITIVE2    * VIRIDANS STREPTOCOCCUS  MRSA PCR Screening     Status: Abnormal   Collection Time: 12/01/14  3:17 PM  Result Value Ref Range Status   MRSA by PCR POSITIVE (A) NEGATIVE Final    Comment:        The GeneXpert MRSA Assay (FDA approved for NASAL specimens only), is one component of a comprehensive MRSA colonization surveillance program. It is not intended to diagnose MRSA infection nor to guide or monitor treatment for MRSA infections. CRITICAL RESULT CALLED TO, READ BACK BY AND VERIFIED WITH: MADDIE HIMES AT [id number] ON 12/01/14 BY [facility]   Cath Tip Culture     Status: None (Preliminary result)   Collection Time: 12/03/14  4:45 PM  Result Value Ref Range Status   Specimen Description CATH TIP  Final   Special Requests NONE  Final   Culture NO GROWTH < 24 HOURS  Final   Report Status PENDING  Incomplete    Coagulation Studies: No results for input(s): LABPROT, INR in the last 72 hours.  Urinalysis: No results for input(s): COLORURINE, LABSPEC, PHURINE, GLUCOSEU, HGBUR, BILIRUBINUR, KETONESUR, PROTEINUR, UROBILINOGEN, NITRITE, LEUKOCYTESUR in the last 72 hours.  Invalid input(s): APPERANCEUR    Imaging: No results found.   Medications:     . sodium chloride   Intravenous Once  . albuterol  2.5 mg Nebulization Q6H WA  . amiodarone  200 mg Oral BID  . amLODipine  5 mg Oral Daily  . antiseptic  oral rinse  7 mL Mouth Rinse BID  . antiseptic oral rinse  7 mL Mouth Rinse BID  . calcium carbonate  1 tablet Oral BID PC  . Chlorhexidine Gluconate Cloth  6 each Topical  [id number]  . cinacalcet  60 mg Oral Q breakfast  . docusate sodium  100 mg Oral BID  . ferrous sulfate  325 mg Oral Daily  . folic acid-vitamin b complex-vitamin c-selenium-zinc  1 tablet Oral Daily  . furosemide  80 mg Oral Daily  . insulin aspart  0-5 Units Subcutaneous QHS  . insulin aspart  0-9 Units Subcutaneous TID WC  . isosorbide mononitrate  30 mg Oral Daily  . metoprolol tartrate  12.5 mg Oral BID  . mometasone-formoterol  2 puff Inhalation BID  . mupirocin ointment  1 application Nasal BID  . pantoprazole  40 mg Oral Daily  . piperacillin-tazobactam (ZOSYN)  IV  3.375 g Intravenous Q12H  . sodium chloride  3 mL Intravenous Q12H  . vancomycin  750 mg Intravenous Once per day on Tue Thu Sat   sodium chloride, sodium chloride, sodium chloride, acetaminophen, alteplase, alum & mag hydroxide-simeth, diazepam, heparin, hydrALAZINE, lidocaine (PF), Lidocaine-Prilocaine (Bulk), lidocaine-prilocaine, ondansetron (ZOFRAN) IV, oxyCODONE, pentafluoroprop-tetrafluoroeth, sodium chloride, traMADol  Assessment/ Plan:  59 y.o. 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  ESRD on HD TTHS:  PermCath was removed yesterday. We will monitor the patient's electrolytes closely. We will consider placing a temporary access tomorrow depending upon her volume status.  2.  Anemia of CKD/anemia blood loss: Hemoglobin currently 8.1. We will continue Epogen with dialysis.  3.  SHPTH:  Continue sensipar and calcium carbonate, check phosphorus with next dialysis.  4.  Sepsis: Streptococcus viridans noted in the  blood. Patient currently on Zosyn and vancomycin. Would taper antibiotic therapy based on sensitivities however defer this to hospitalist.  5.  Left sided bacterial pneumonia:  This was noted on prior CT scan and current CXR. -Streptococcus viridans most likely etiology of this. As above defer anabolic therapy to hospitalist.     LOS: 3 Jimmey Hengel 11/15/20163:06 PM

## 2014-12-04 NOTE — Care Management (Signed)
Patient RA O2 sats at rest 88%.  MD has placed order for home continuous O2.  If patient does not discharge within 48 hours of qualifying saturations, sats will have to be obtained again.  Will from [facility] notified that patient may need continuous at time of discharge.  Patient currently has nocturnal O2 provided by [facility].

## 2014-12-04 NOTE — Consult Note (Signed)
   [facility] Inpatient Consult   12/04/2014  Kelsey Peterson 28-Aug-1955 [id number]  Spoke with patient at bedside regarding [facility] services. Patient agreeable to services, consent signed and packet of [facility] information provided. [name] has a hearing impairment, she reads lips well when you are facing her, she states she sometimes has to write notes to communicate, she no longer has the [place] phone system, she uses her cell phone to text as her form of communication, she unable to communicate by talking on the telephone. Patient will receive post hospital follow up text and to arrange home visits.  I have confirmed her best contact phone number [phone]. I have placed the [facility] RN care manager Kathie Rhodes to be assigned to her in patients cell phone contact list and explained that RN will first send her a text after discharge. Of note, [facility] Care Management services does not replace or interfere with any services that are arranged by inpatient case management or social work. For questions, please contact:   Joylene Draft, RN, [place] Management/Hospital Liaison [phone]- Mobile [phone]- [place]

## 2014-12-05 LAB — CBC
HCT: 25.1 % — ABNORMAL LOW (ref 35.0–47.0)
HEMOGLOBIN: 8.2 g/dL — AB (ref 12.0–16.0)
MCH: 26.9 pg (ref 26.0–34.0)
MCHC: 32.6 g/dL (ref 32.0–36.0)
MCV: 82.3 fL (ref 80.0–100.0)
Platelets: 194 10*3/uL (ref 150–440)
RBC: 3.05 MIL/uL — AB (ref 3.80–5.20)
RDW: 23.9 % — ABNORMAL HIGH (ref 11.5–14.5)
WBC: 8.2 10*3/uL (ref 3.6–11.0)

## 2014-12-05 LAB — CLOSTRIDIUM DIFFICILE BY PCR: CDIFFPCR: NEGATIVE

## 2014-12-05 LAB — BASIC METABOLIC PANEL
Anion gap: 10 (ref 5–15)
BUN: 26 mg/dL — ABNORMAL HIGH (ref 6–20)
CHLORIDE: 91 mmol/L — AB (ref 101–111)
CO2: 26 mmol/L (ref 22–32)
CREATININE: 6.52 mg/dL — AB (ref 0.44–1.00)
Calcium: 7.6 mg/dL — ABNORMAL LOW (ref 8.9–10.3)
GFR calc non Af Amer: 6 mL/min — ABNORMAL LOW (ref >60)
GFR, EST AFRICAN AMERICAN: 7 mL/min — AB (ref >60)
Glucose, Bld: 84 mg/dL (ref 65–99)
POTASSIUM: 3.4 mmol/L — AB (ref 3.5–5.1)
SODIUM: 127 mmol/L — AB (ref 135–145)

## 2014-12-05 LAB — C DIFFICILE QUICK SCREEN W PCR REFLEX
C DIFFICILE (CDIFF) TOXIN: NEGATIVE
C DIFFICLE (CDIFF) ANTIGEN: POSITIVE — AB

## 2014-12-05 LAB — PARATHYROID HORMONE, INTACT (NO CA): PTH: 25 pg/mL (ref 15–65)

## 2014-12-05 MED ORDER — LIDOCAINE HCL (PF) 1 % IJ SOLN
INTRAMUSCULAR | Status: AC
  Filled 2014-12-05: qty 10

## 2014-12-05 NOTE — Progress Notes (Signed)
[place] INFECTIOUS DISEASE PROGRESS NOTE Date of Admission:  12/01/2014     ID: Kelsey Peterson is a 59 y.o. female with viridans strep bacteremia Principal Problem:   Pneumonia Active Problems:   Sepsis ([place])   UTI (lower urinary tract infection)   Lactic acid acidosis   Cholelithiasis   Subjective: Report stomach upset, no fevers. Asking for suppository  ROS  Eleven systems are reviewed and negative except per hpi  Medications:  Antibiotics Given (last 72 hours)    Date/Time Action Medication Dose Rate   12/02/14 2200 Given   piperacillin-tazobactam (ZOSYN) IVPB 3.375 g 3.375 g 12.5 mL/hr   12/03/14 1113 Given   piperacillin-tazobactam (ZOSYN) IVPB 3.375 g 3.375 g 12.5 mL/hr   12/03/14 2235 Given   piperacillin-tazobactam (ZOSYN) IVPB 3.375 g 3.375 g 12.5 mL/hr   12/04/14 1128 Given   piperacillin-tazobactam (ZOSYN) IVPB 3.375 g 3.375 g 12.5 mL/hr     . amiodarone  200 mg Oral BID  . amLODipine  5 mg Oral Daily  . calcium carbonate  1 tablet Oral BID PC  . Chlorhexidine Gluconate Cloth  6 each Topical Q0600  . cinacalcet  60 mg Oral Q breakfast  . docusate sodium  100 mg Oral BID  . ferrous sulfate  325 mg Oral Daily  . folic acid-vitamin b complex-vitamin c-selenium-zinc  1 tablet Oral Daily  . furosemide  80 mg Oral Daily  . insulin aspart  0-5 Units Subcutaneous QHS  . insulin aspart  0-9 Units Subcutaneous TID WC  . isosorbide mononitrate  30 mg Oral Daily  . metoprolol tartrate  12.5 mg Oral BID  . mometasone-formoterol  2 puff Inhalation BID  . mupirocin ointment  1 application Nasal BID  . pantoprazole  40 mg Oral Daily  . [START ON 12/06/2014] vancomycin  750 mg Intravenous Once per day on Tue Thu Sat    Objective: Vital signs in last 24 hours: Temp:  [97.6 F (36.4 C)-97.9 F (36.6 C)] 97.9 F (36.6 C) (11/16 0618) Pulse Rate:  [57-69] 67 (11/16 1230) Resp:  [20] 20 (11/16 0618) BP: (121-136)/(74-83) 122/75 mmHg (11/16 1230) SpO2:  [97  %-98 %] 98 % (11/16 1230) Constitutional: Obese, very HOH, difficult to understand speech HENT: [place]/AT, PERRLA, no scleral icterus Mouth/Throat: Oropharynx is clear and dry, has dentures. No oropharyngeal exudate.  Cardiovascular: Normal rate, 2/6 SM Pulmonary/Chest: mild rhonchi Neck supple, no nuchal rigidity R chest wall with prior cath site Abdominal: Soft. obesity. exhibits no distension. There is no tenderness.  Lymphadenopathy: no cervical adenopathy. No axillary adenopathy Neurological: diff to understand Skin: Skin is warm and dry. No rash noted. No erythema. No stigmata of endocarditis Psychiatric: a normal mood and affect. behavior is normal.  R AVF site wnl  Lab Results  Recent Labs  12/04/14 0410 12/05/14 0448  WBC 8.8 8.2  HGB 8.1* 8.2*  HCT 25.0* 25.1*  NA 128* 127*  K 3.2* 3.4*  CL 91* 91*  CO2 25 26  BUN 20 26*  CREATININE 5.31* 6.52*    Microbiology: Results for orders placed or performed during the hospital encounter of 12/01/14  Culture, blood (routine x 2)     Status: None   Collection Time: 12/01/14 11:34 AM  Result Value Ref Range Status   Specimen Description BLOOD LEFT ASSIST CONTROL  Final   Special Requests BAA,8ML,ANA,AER  Final   Culture  Setup Time   Final    GRAM POSITIVE COCCI IN BOTH AEROBIC AND ANAEROBIC BOTTLES CRITICAL  RESULT CALLED TO, READ BACK BY AND VERIFIED WITH: JANE PHLUFMM AT [place] 12/02/14.PMH CONFIRMED BY RWW    Culture   Final    VIRIDANS STREPTOCOCCUS IN BOTH AEROBIC AND ANAEROBIC BOTTLES    Report Status 12/04/2014 FINAL  Final   Organism ID, Bacteria VIRIDANS STREPTOCOCCUS  Final      Susceptibility   Viridans streptococcus - MIC*    ERYTHROMYCIN Value in next row Resistant      RESISTANT>=8    TETRACYCLINE Value in next row Resistant      RESISTANT>=16    VANCOMYCIN Value in next row Sensitive      SENSITIVE0.5    CEFTRIAXONE Value in next row Sensitive      SENSITIVE<=0.25    PENICILLIN Value in next  row Sensitive      SENSITIVE0.12    LEVOFLOXACIN Value in next row Sensitive      SENSITIVE2    * VIRIDANS STREPTOCOCCUS  Culture, blood (routine x 2)     Status: None   Collection Time: 12/01/14 12:01 PM  Result Value Ref Range Status   Specimen Description BLOOD LEFT ARM  Final   Special Requests BOTTLES DRAWN AEROBIC AND ANAEROBIC 5CC  Final   Culture  Setup Time   Final    GRAM POSITIVE COCCI IN BOTH AEROBIC AND ANAEROBIC BOTTLES CRITICAL RESULT CALLED TO, READ BACK BY AND VERIFIED WITH: JANE PHLUFMM AT [place] 12/02/14.PMH CONFIRMED BY RWW    Culture   Final    VIRIDANS STREPTOCOCCUS IN BOTH AEROBIC AND ANAEROBIC BOTTLES    Report Status 12/04/2014 FINAL  Final   Organism ID, Bacteria VIRIDANS STREPTOCOCCUS  Final      Susceptibility   Viridans streptococcus - MIC*    ERYTHROMYCIN Value in next row Resistant      RESISTANT>=8    TETRACYCLINE Value in next row Resistant      RESISTANT>=16    VANCOMYCIN Value in next row Sensitive      SENSITIVE0.5    PENICILLIN Value in next row Sensitive      SENSITIVE0.12    CEFTRIAXONE Value in next row Sensitive      SENSITIVE0.25    LEVOFLOXACIN Value in next row Sensitive      SENSITIVE2    * VIRIDANS STREPTOCOCCUS  MRSA PCR Screening     Status: Abnormal   Collection Time: 12/01/14  3:17 PM  Result Value Ref Range Status   MRSA by PCR POSITIVE (A) NEGATIVE Final    Comment:        The GeneXpert MRSA Assay (FDA approved for NASAL specimens only), is one component of a comprehensive MRSA colonization surveillance program. It is not intended to diagnose MRSA infection nor to guide or monitor treatment for MRSA infections. CRITICAL RESULT CALLED TO, READ BACK BY AND VERIFIED WITH: MADDIE HIMES AT [id number] ON 12/01/14 BY [facility]   Cath Tip Culture     Status: None (Preliminary result)   Collection Time: 12/03/14  4:45 PM  Result Value Ref Range Status   Specimen Description CATH TIP  Final   Special Requests NONE  Final   Culture  NO GROWTH 2 DAYS  Final   Report Status PENDING  Incomplete  Culture, blood (routine x 2)     Status: None (Preliminary result)   Collection Time: 12/04/14  8:20 PM  Result Value Ref Range Status   Specimen Description BLOOD LEFT ARM  Final   Special Requests BOTTLES DRAWN AEROBIC AND ANAEROBIC 4CC  Final  Culture NO GROWTH < 24 HOURS  Final   Report Status PENDING  Incomplete    Studies/Results: No results found.  Assessment/Plan: LYLY CHIRIBOGA is a 59 y.o. female with ESRD with HD through [place] (although also has a functioning AVF) admitted with fevers, leukocytosis and found to have viridans strep bacteremia. S/p removal of cath 11/14, bcx neg 11/15. TTE negative  Recommendations FU repeat bcx to document clearance Would suggest 4 weeks of IV therapy for the bacteremia given AVF in place and potential to have seeded it. This can be given with vanco so can be given at HD for ease of dosing. Following completion of 4 weeks treatment - from date of first neg bcx - would repeat bcx as subsequent HD sessions x 2 to ensure clearance No need for TEE since will treat 4 weeks either way. Thank you very much for the consult. Will follow with you.  [place], [place]   12/05/2014, 3:35 PM

## 2014-12-05 NOTE — Progress Notes (Signed)
Patient has order to obtain consent for temporary dialysis catheter.  Patient refuses to sign consent and would like them to try her right AVF prior to getting temporary dialysis catheter.  Dr. Lucky Cowboy notified and stated this was ok.

## 2014-12-05 NOTE — Care Management (Addendum)
Patient is refusing to have a temporary dialysis cath inserted.  Her permcath was removed yesterday due to infection.  She has positive blood culture.  Nephrology has documented that her AV fistula  can be used for her dialysis.  ID has consulted and is recommending 4 weeks of IV Vancomycin with her dialysis.  Patient does present from home and as of 11/15, she has qualified for continuous home 02.  If she does not discharge 11/17 (anticipating 11/17 or 11/18) she will have to be re qualified.  Obtained order from attending for physical therapy consult to determine if she is functionally capable of returning home to live alone.  If she will allow, home health services can be arranged: SN PT Aide

## 2014-12-05 NOTE — Evaluation (Signed)
Clinical/Bedside Swallow Evaluation Patient Details  Name: Kelsey Peterson MRN: [id number] Date of Birth: [birth year]  Today's Date: 12/05/2014 Time: SLP Start Time (ACUTE ONLY): [id number] SLP Stop Time (ACUTE ONLY): [id number] SLP Time Calculation (min) (ACUTE ONLY): 20 min  Past Medical History:  Past Medical History  Diagnosis Date  . COPD (chronic obstructive pulmonary disease) ([place])   . ESRD on hemodialysis ([place])   . DM2 (diabetes mellitus, type 2) ([place])   . Hepatitis C   . Migraine with aura   . HOH (hard of hearing)   . Mitral regurgitation     a. echo 06/2014: EF 60%, no WMA, GR1DD, mild AI, calcified mitral annulus, mod MR, no atrial septal defect or patent foramen ovale   . Cognitive impairment   . CAD (coronary artery disease)   . HTN (hypertension)    Past Surgical History:  Past Surgical History  Procedure Laterality Date  . Dialysis fistula creation    . Tubal ligation    . Colonoscopy with propofol Left 07/09/2014    Procedure: COLONOSCOPY WITH PROPOFOL;  Surgeon: Hulen Luster, MD;  Location[facility] ENDOSCOPY;  Service: Endoscopy;  Laterality: Left;  . Colonoscopy N/A 07/15/2014    Procedure: COLONOSCOPY;  Surgeon: Manya Silvas, MD;  [facility] ENDOSCOPY;  Service: Endoscopy;  Laterality: N/A;   HPI:  Kelsey Peterson is a 59 y.o. female with a known history of hypertension, diabetes, CAD, ESRD and COPD. The patient was sent from dialysis center due to shortness of breath and fever today. She is very hard of hearing and poor historian. Per ED physician, she completed hemodialysis today. The patient complains of cough and shortness of breath but denies any chest pain, palpitation, orthopnea, nocturnal dyspnea or leg edema. The patient denies any bloody stool but had melena last week. The patient has fever 103. Chest x-ray show left sided consolidation. WBC 24,000. She was treated with vancomycin and Zosyn in the ED.   Assessment / Plan / Recommendation Clinical Impression   Pt presented with no overt oral pharyngeal phase dysphagia and no s/s of immediate or overt aspiration. Pt did present with esophageal symptoms indicative of esophageal phase dysphagia c/b belching and burping after each swallow of thin liquid (not noted on solid trials). Due to the moderate+ nature of the belching and burping, ST recommends f/u with GI.  Due to esophageal s/s, Pt is at an increased risk for reflux which in turn puts her at an increased risk for aspiration of reflux and associated respiratory issues. Pt would benefit from a mechanical soft diet (dysphagia 3) with thin liquids and should use strict aspiration precautions. Pt educated. Meds to be administered in puree (however Pt reports the applesauce is causing GI symptoms; consider meds in alternative puree). NSG updated.    Aspiration Risk  Moderate aspiration risk (based on GI symptoms noted)    Diet Recommendation   Dysphagia 3 (mechanical soft) with thin liquids (no straw, cup only -- strict aspiration precautions)  Medication Administration: Whole meds with puree    Other  Recommendations Recommended Consults: Consider GI evaluation;Consider esophageal assessment Oral Care Recommendations: Oral care BID;Staff/trained caregiver to provide oral care Other Recommendations: Remove water pitcher   Follow up Recommendations   (TBD)    Frequency and Duration min 3x week  1 week       Swallow Study   General Date of Onset: 12/01/14 HPI: Kelsey Peterson is a 59 y.o. female with a known history of hypertension, diabetes, CAD, ESRD  and COPD. The patient was sent from dialysis center due to shortness of breath and fever today. She is very hard of hearing and poor historian. Per ED physician, she completed hemodialysis today. The patient complains of cough and shortness of breath but denies any chest pain, palpitation, orthopnea, nocturnal dyspnea or leg edema. The patient denies any bloody stool but had melena last week. The patient  has fever 103. Chest x-ray show left sided consolidation. WBC 24,000. She was treated with vancomycin and Zosyn in the ED. Type of Study: Bedside Swallow Evaluation Diet Prior to this Study:  (unknown) Temperature Spikes Noted: No Respiratory Status: Nasal cannula (2 liters) History of Recent Intubation: No Behavior/Cognition: Alert;Confused;Cooperative Oral Care Completed by SLP: No Oral Cavity - Dentition: Dentures, not available (dentures top, unavailable; natural dentition lower (adequate) Vision: Functional for self-feeding Self-Feeding Abilities: Able to feed self;Needs set up Patient Positioning: Upright in bed Baseline Vocal Quality: Normal Volitional Cough: Strong Volitional Swallow: Able to elicit    Oral/Motor/Sensory Function Overall Oral Motor/Sensory Function: Within functional limits   Ice Chips Ice chips: Within functional limits Presentation: Spoon Other Comments: Pt took 3 ice chips by mouth in single trials with no immediate or overt s/s of aspiration   Thin Liquid Thin Liquid: Within functional limits Presentation: Cup Other Comments: Pt drank ~5 ounces of water by cup with no immediate or overt s/s of aspiration (belching however was noted across these trials).    Nectar Thick Nectar Thick Liquid: Not tested   Honey Thick Honey Thick Liquid: Not tested   Puree Puree: Not tested   Solid Solid: Within functional limits Presentation: Spoon;Self Fed Other Comments: Pt consumed 15+ bites of mechanical soft lunch tray items with no immediate or overt s/s of aspiration/       Kelsey Peterson 12/05/2014,1:31 PM

## 2014-12-05 NOTE — Progress Notes (Signed)
[facility] NAME: Kelsey Peterson    MR#:  [id number]  DATE OF BIRTH:  Jun 11, 1955  SUBJECTIVE:  Feels better, having loose stools per nursing but pt denies.  REVIEW OF SYSTEMS:    Review of Systems  Constitutional: Negative for fever and chills.  HENT: Negative for congestion and tinnitus.   Eyes: Negative for blurred vision and double vision.  Respiratory: Negative for cough, shortness of breath and wheezing.   Cardiovascular: Negative for chest pain, orthopnea and PND.  Gastrointestinal: Positive for nausea. Negative for diarrhea.  Genitourinary: Negative for dysuria and hematuria.  Neurological: Negative for dizziness, sensory change and focal weakness.  All other systems reviewed and are negative.   Nutrition: Renal Tolerating Diet: Yes Tolerating PT: Await evaluation DRUG ALLERGIES:   Allergies  Allergen Reactions  . Contrast Media [Iodinated Diagnostic Agents] Other (See Comments)    Unknown reaction  . Morphine And Related Hives  . Other Rash    Blood Pressure Pill (Pt doesn't remember name)     VITALS:  Blood pressure 137/84, pulse 71, temperature 97.9 F (36.6 C), temperature source Oral, resp. rate 20, height 5' 2.5" (1.588 m), weight 80.241 kg (176 lb 14.4 oz), SpO2 100 %. PHYSICAL EXAMINATION:  Physical Exam  GENERAL:  59 y.o.-year-old obese patient lying in the bed with no acute distress.  EYES: Pupils equal, round, reactive to light and accommodation. No scleral icterus. Extraocular muscles intact.  HEENT: Head atraumatic, normocephalic. Oropharynx and nasopharynx clear.  NECK:  Supple, no jugular venous distention. No thyroid enlargement, no tenderness.  LUNGS: Normal breath sounds bilaterally, no wheezing, rales, rhonchi. No use of accessory muscles of respiration.  CARDIOVASCULAR: S1, S2 normal. No murmurs, rubs, or gallops.  ABDOMEN: Soft, nontender, nondistended. Bowel sounds present. No  organomegaly or mass.  EXTREMITIES: No cyanosis, clubbing or edema b/l.    NEUROLOGIC: Cranial nerves II through XII are intact. No focal Motor or sensory deficits b/l. Globally weak  PSYCHIATRIC: The patient is alert and oriented x 2. Agitated at times.  SKIN: No obvious rash, lesion, or ulcer.  Right chest dialysis catheter in place. Removal of right jugular Permcath (11/14 by Dr Lucky Cowboy) and dressing in place & intact.   LABORATORY PANEL:   CBC  Recent Labs Lab 12/05/14 0448  WBC 8.2  HGB 8.2*  HCT 25.1*  PLT 194   ------------------------------------------------------------------------------------------------------------------  Chemistries   Recent Labs Lab 12/04/14 0410 12/05/14 0448  NA 128* 127*  K 3.2* 3.4*  CL 91* 91*  CO2 25 26  GLUCOSE 90 84  BUN 20 26*  CREATININE 5.31* 6.52*  CALCIUM 8.2* 7.6*  AST 23  --   ALT 18  --   ALKPHOS 55  --   BILITOT 1.4*  --    ------------------------------------------------------------------------------------------------------------------  Cardiac Enzymes  Recent Labs Lab 12/01/14 1132  TROPONINI 0.62*   ASSESSMENT AND PLAN:   59 year old female with past medical history of end-stage renal disease on hemodialysis, COPD, type 2 diabetes, hepatitis C, coronary artery disease, cognitive impairment, who presented to the hospital due to fever and shortness of breath from dialysis.  #1 sepsis-present on admission-Continue IV vancomycin, stopped Zosyn. Blood cultures are growing Strep Viridans - Appreciate infectious disease input - will need 4 weeks of IV therapy for the bacteremia given AVF in place and potential to have seeded it. This can be given with vanco so can be given at HD for ease of dosing. Following completion  of 4 weeks treatment - from date of first neg bcx - would repeat bcx as subsequent HD sessions x 2 to ensure clearance. - Removal of right jugular Permcath (11/14) - Dr Lucky Cowboy - repeat blood c/s from 11/15 neg  thus far.  #2 pneumonia-likely source of patient's sepsis. Continue IV vancomycin  #3 cholelithiasis-patient had abdominal ultrasound which showed gallstones but no evidence of acute cholecystitis. -Appreciate surgical input and they recommended getting a gastroenterology consult. I do not suspect that the patient has cholangitis presently as patient's LFTs are normal and her bilirubins only marginally elevated. We'll follow LFTs and follow her clinically for now.  #4 end-stage renal disease on hemodialysis-nephrology has been consulted. -Continue dialysis on Tuesday Thursday Saturday schedule as per nephrology. Permcath(RT JUGULAR) removed by Dr Lucky Cowboy on 11/14  #5 secondary hyperparathyroidism-continue Sensipar  #6 hypertension-continue metoprolol, Imdur, Norvasc.  #7 COPD-no acute exacerbation. Continue Dulera, when necessary DuoNeb's.  #8 GERD-continue Protonix.  #9 type 2 diabetes-continue sliding scale insulin.  #10 anemia of chronic disease-likely secondary to end-stage renal disease. -Hemoglobin down to 6.4 on 11/13 - s/p 1 unit of PRBC transfusion and follow hemoglobin. It's 8.1 today  # 11 Throat pain: still feels same, will ask ST to eval her  All the records are reviewed and case discussed with Care Management/Social Workerr. Management plans discussed with the patient, family and they are in agreement.  CODE STATUS: Full  consult palliative care. Await PT eval - as she may need placement  DVT Prophylaxis: Teds and SCDs  TOTAL TIME TAKING CARE OF THIS PATIENT: 30 minutes.   POSSIBLE D/C IN 1-2 DAYS, DEPENDING ON CLINICAL CONDITION.   [facility], Mahati Vajda M.D on 12/05/2014 at 5:06 PM  Between 7am to 6pm - Pager - [phone]  After 6pm go to [email or web address] - password [email or web address] [facility]  Office  [phone]  CC: Primary care physician; Lavera Guise, MD

## 2014-12-05 NOTE — Progress Notes (Signed)
Contacted by Nephrology today about patient and need to restart dialysis. She has previously refused using her AVF.  Put in an order to consent for temporary catheter to start dialysis which patient is refusing and asks why she can't use her fistula.  She can not have a Permcath back yet due to infection.  She can resume dialysis with her AVF which is still running and no catheter will be place in this patient.  If she refuses using her AVF again, I would consider palliative care consult and potentially Hospice for the patient.

## 2014-12-05 NOTE — Progress Notes (Signed)
[facility]  ROUNDING NOTE   Subjective:  Pt seen at bedside. Explained that she had strep in the blood. Permcath was removed.  Has RUE AV access in place.  Access has abnormal pulsatility to it.   Objective:  Vital signs in last 24 hours:  Temp:  [97.6 F (36.4 C)-97.9 F (36.6 C)] 97.9 F (36.6 C) (11/16 0618) Pulse Rate:  [57-71] 71 (11/16 1605) Resp:  [20] 20 (11/16 0618) BP: (121-137)/(74-84) 137/84 mmHg (11/16 1605) SpO2:  [97 %-100 %] 100 % (11/16 1605)  Weight change:  Filed Weights   12/01/14 1100 12/01/14 1500 12/02/14 0352  Weight: 83.915 kg (185 lb) 79.334 kg (174 lb 14.4 oz) 80.241 kg (176 lb 14.4 oz)    Intake/Output:     Intake/Output this shift:  Total I/O In: -  Out: 2 [Stool:2]  Physical Exam: General: NAD, sitting up in bed  Head: Normocephalic, atraumatic. Moist oral mucosal membranes  Eyes: Anicteric  Neck: Supple, trachea midline  Lungs:  Clear to auscultation  Heart: Regular rate and rhythm  Abdomen:  Soft, nontender, BS present  Extremities:  2+ peripheral edema.  Neurologic: Nonfocal, moving all four extremities  Skin: No lesions  Access: R Clare Gandy, RUE AV access    Basic Metabolic Panel:  Recent Labs Lab 12/01/14 1132 12/02/14 0523 12/03/14 0419 12/04/14 0410 12/05/14 0448  NA 136 134* 132* 128* 127*  K 3.2* 3.1* 3.6 3.2* 3.4*  CL 99* 101 95* 91* 91*  CO2 27 26 27 25 26   GLUCOSE 131* 95 131* 90 84  BUN 7 12 17 20  26*  CREATININE 2.75* 3.76* 4.52* 5.31* 6.52*  CALCIUM 8.3* 8.5* 8.6* 8.2* 7.6*  PHOS  --   --   --  4.6  --     Liver Function Tests:  Recent Labs Lab 12/01/14 1132 12/02/14 0523 12/03/14 0419 12/04/14 0410  AST 34 24 28 23   ALT 19 16 20 18   ALKPHOS 76 56 58 55  BILITOT 2.7* 2.3* 1.9* 1.4*  PROT 7.6 6.1* 7.0 6.7  ALBUMIN 3.0* 2.6* 2.9* 2.6*    Recent Labs Lab 12/01/14 1132  LIPASE 25   No results for input(s): AMMONIA in the last 168 hours.  CBC:  Recent Labs Lab  12/01/14 1201 12/02/14 0523 12/02/14 1746 12/03/14 0419 12/04/14 0410 12/05/14 0448  WBC 23.8* 14.9*  --  10.6 8.8 8.2  HGB 7.8* 6.4* 8.4* 8.4* 8.1* 8.2*  HCT 24.6* 21.3* 26.8* 25.1* 25.0* 25.1*  MCV 81.4 84.4  --  83.1 82.6 82.3  PLT 281 168  --  208 195 194    Cardiac Enzymes:  Recent Labs Lab 12/01/14 1132  TROPONINI 0.62*    BNP: Invalid input(s): POCBNP  CBG: No results for input(s): GLUCAP in the last 168 hours.  Microbiology: Results for orders placed or performed during the hospital encounter of 12/01/14  Culture, blood (routine x 2)     Status: None   Collection Time: 12/01/14 11:34 AM  Result Value Ref Range Status   Specimen Description BLOOD LEFT ASSIST CONTROL  Final   Special Requests BAA,8ML,ANA,AER  Final   Culture  Setup Time   Final    GRAM POSITIVE COCCI IN BOTH AEROBIC AND ANAEROBIC BOTTLES CRITICAL RESULT CALLED TO, READ BACK BY AND VERIFIED WITH: JANE PHLUFMM AT [place] 12/02/14.PMH CONFIRMED BY RWW    Culture   Final    VIRIDANS STREPTOCOCCUS IN BOTH AEROBIC AND ANAEROBIC BOTTLES    Report Status 12/04/2014 FINAL  Final   Organism ID, Bacteria VIRIDANS STREPTOCOCCUS  Final      Susceptibility   Viridans streptococcus - MIC*    ERYTHROMYCIN Value in next row Resistant      RESISTANT>=8    TETRACYCLINE Value in next row Resistant      RESISTANT>=16    VANCOMYCIN Value in next row Sensitive      SENSITIVE0.5    CEFTRIAXONE Value in next row Sensitive      SENSITIVE<=0.25    PENICILLIN Value in next row Sensitive      SENSITIVE0.12    LEVOFLOXACIN Value in next row Sensitive      SENSITIVE2    * VIRIDANS STREPTOCOCCUS  Culture, blood (routine x 2)     Status: None   Collection Time: 12/01/14 12:01 PM  Result Value Ref Range Status   Specimen Description BLOOD LEFT ARM  Final   Special Requests BOTTLES DRAWN AEROBIC AND ANAEROBIC 5CC  Final   Culture  Setup Time   Final    GRAM POSITIVE COCCI IN BOTH AEROBIC AND ANAEROBIC  BOTTLES CRITICAL RESULT CALLED TO, READ BACK BY AND VERIFIED WITH: JANE PHLUFMM AT [place] 12/02/14.PMH CONFIRMED BY RWW    Culture   Final    VIRIDANS STREPTOCOCCUS IN BOTH AEROBIC AND ANAEROBIC BOTTLES    Report Status 12/04/2014 FINAL  Final   Organism ID, Bacteria VIRIDANS STREPTOCOCCUS  Final      Susceptibility   Viridans streptococcus - MIC*    ERYTHROMYCIN Value in next row Resistant      RESISTANT>=8    TETRACYCLINE Value in next row Resistant      RESISTANT>=16    VANCOMYCIN Value in next row Sensitive      SENSITIVE0.5    PENICILLIN Value in next row Sensitive      SENSITIVE0.12    CEFTRIAXONE Value in next row Sensitive      SENSITIVE0.25    LEVOFLOXACIN Value in next row Sensitive      SENSITIVE2    * VIRIDANS STREPTOCOCCUS  MRSA PCR Screening     Status: Abnormal   Collection Time: 12/01/14  3:17 PM  Result Value Ref Range Status   MRSA by PCR POSITIVE (A) NEGATIVE Final    Comment:        The GeneXpert MRSA Assay (FDA approved for NASAL specimens only), is one component of a comprehensive MRSA colonization surveillance program. It is not intended to diagnose MRSA infection nor to guide or monitor treatment for MRSA infections. CRITICAL RESULT CALLED TO, READ BACK BY AND VERIFIED WITH: MADDIE HIMES AT [id number] ON 12/01/14 BY [facility]   Cath Tip Culture     Status: None (Preliminary result)   Collection Time: 12/03/14  4:45 PM  Result Value Ref Range Status   Specimen Description CATH TIP  Final   Special Requests NONE  Final   Culture NO GROWTH 2 DAYS  Final   Report Status PENDING  Incomplete  Culture, blood (routine x 2)     Status: None (Preliminary result)   Collection Time: 12/04/14  8:20 PM  Result Value Ref Range Status   Specimen Description BLOOD LEFT ARM  Final   Special Requests BOTTLES DRAWN AEROBIC AND ANAEROBIC 4CC  Final   Culture NO GROWTH < 24 HOURS  Final   Report Status PENDING  Incomplete    Coagulation Studies: No results for input(s):  LABPROT, INR in the last 72 hours.  Urinalysis: No results for input(s): COLORURINE, LABSPEC, [place], [place], [place], [place],  KETONESUR, PROTEINUR, UROBILINOGEN, NITRITE, LEUKOCYTESUR in the last 72 hours.  Invalid input(s): APPERANCEUR    Imaging: No results found.   Medications:     . amiodarone  200 mg Oral BID  . amLODipine  5 mg Oral Daily  . calcium carbonate  1 tablet Oral BID PC  . Chlorhexidine Gluconate Cloth  6 each Topical Q0600  . cinacalcet  60 mg Oral Q breakfast  . docusate sodium  100 mg Oral BID  . ferrous sulfate  325 mg Oral Daily  . folic acid-vitamin b complex-vitamin c-selenium-zinc  1 tablet Oral Daily  . furosemide  80 mg Oral Daily  . insulin aspart  0-5 Units Subcutaneous QHS  . insulin aspart  0-9 Units Subcutaneous TID WC  . isosorbide mononitrate  30 mg Oral Daily  . metoprolol tartrate  12.5 mg Oral BID  . mometasone-formoterol  2 puff Inhalation BID  . mupirocin ointment  1 application Nasal BID  . pantoprazole  40 mg Oral Daily  . [START ON 12/06/2014] vancomycin  750 mg Intravenous Once per day on Tue Thu Sat   sodium chloride, sodium chloride, acetaminophen, albuterol, alteplase, alum & mag hydroxide-simeth, heparin, hydrALAZINE, lidocaine (PF), Lidocaine-Prilocaine (Bulk), lidocaine-prilocaine, ondansetron (ZOFRAN) IV, oxyCODONE, pentafluoroprop-tetrafluoroeth, sodium chloride, traMADol  Assessment/ Plan:  59 y.o. 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  ESRD on HD TTHS:  PermCath was removed.  Has access in place that pt wouldn't allow cannulation in the past.  Seems more willing now.  Access with abnormal bruit.  Consult vascular for fistulogram.  Reevaluate for HD in AM.  2.  Anemia of CKD/anemia  blood loss: restart epogen when she restarts dialysis.  3.  SHPTH:  Continue sensipar and calcium carbonate, check phosphorus with next dialysis.  4.  Sepsis: Streptococcus viridans noted in the blood. Continue vancomycin.    5.  Left sided bacterial pneumonia:  This was noted on prior CT scan and current CXR. -Streptococcus viridans most likely etiology of this. As above defer anabolic therapy to hospitalist.     LOS: 4 Kelsey Peterson 11/16/20164:11 PM

## 2014-12-05 NOTE — Progress Notes (Signed)
Patient alert and oriented but very slow to respond.  Patient refused to take all of her PO medications, even crushed in applesauce.  On 2LNC, which is her baseline.  No complaints of pain.   Several loose stools today on dayshift, sample sent to lab and cdiff results are pending.

## 2014-12-06 ENCOUNTER — Encounter
Admission: EM | Disposition: A | Discharge status: Home Health | Payer: Self-pay | Source: Home / Self Care | Attending: Internal Medicine

## 2014-12-06 LAB — BASIC METABOLIC PANEL
Anion gap: 14 (ref 5–15)
BUN: 31 mg/dL — ABNORMAL HIGH (ref 6–20)
CALCIUM: 7.9 mg/dL — AB (ref 8.9–10.3)
CHLORIDE: 88 mmol/L — AB (ref 101–111)
CO2: 23 mmol/L (ref 22–32)
CREATININE: 7.11 mg/dL — AB (ref 0.44–1.00)
GFR calc Af Amer: 6 mL/min — ABNORMAL LOW (ref >60)
GFR calc non Af Amer: 6 mL/min — ABNORMAL LOW (ref >60)
GLUCOSE: 85 mg/dL (ref 65–99)
Potassium: 3.5 mmol/L (ref 3.5–5.1)
Sodium: 125 mmol/L — ABNORMAL LOW (ref 135–145)

## 2014-12-06 LAB — CBC
HEMATOCRIT: 25.9 % — AB (ref 35.0–47.0)
Hemoglobin: 8.6 g/dL — ABNORMAL LOW (ref 12.0–16.0)
MCH: 27 pg (ref 26.0–34.0)
MCHC: 33.1 g/dL (ref 32.0–36.0)
MCV: 81.6 fL (ref 80.0–100.0)
Platelets: 203 10*3/uL (ref 150–440)
RBC: 3.18 MIL/uL — ABNORMAL LOW (ref 3.80–5.20)
RDW: 23.4 % — AB (ref 11.5–14.5)
WBC: 7.9 10*3/uL (ref 3.6–11.0)

## 2014-12-06 LAB — CATH TIP CULTURE: Culture: NO GROWTH

## 2014-12-06 SURGERY — A/V SHUNTOGRAM/FISTULAGRAM
Anesthesia: Moderate Sedation | Laterality: Right

## 2014-12-06 MED ORDER — CHLORHEXIDINE GLUCONATE CLOTH 2 % EX PADS: 6.0000 | MEDICATED_PAD | Freq: Once | CUTANEOUS | Status: DC

## 2014-12-06 MED ORDER — EPOETIN ALFA 10000 UNIT/ML IJ SOLN
10000.0000 [IU] | INTRAMUSCULAR | Status: DC
  Administered 2014-12-06: 10000 [IU] via INTRAVENOUS

## 2014-12-06 MED ORDER — SODIUM CHLORIDE 0.9 % IV SOLN: INTRAVENOUS | Status: DC

## 2014-12-06 NOTE — Progress Notes (Signed)
Spoke with Charlie RN in dialysis - patient tolerated dialysis well and current fistula worked fine. Updated Dr. Holley Raring, per MD, patient doesn't need to have new fistula placed at this time since current one works. Updated Vascular lab as instructed by Dr. Holley Raring.

## 2014-12-06 NOTE — Progress Notes (Signed)
PT Cancellation Note  Patient Details Name: Kelsey Peterson MRN: [id number] DOB: Jun 02, 1955   Cancelled Treatment:    Reason Eval/Treat Not Completed: Patient at procedure or test/unavailable (HD this morning and ck back as time allows)   Ramond Dial 12/06/2014, 10:41 AM   Mee Hives, PT MS Acute Rehab Dept. Number: [facility] [id number] and [place] [phone]

## 2014-12-06 NOTE — Progress Notes (Signed)
Pt at dialysis. Dr. Holley Raring is aware of sodium level.

## 2014-12-06 NOTE — Progress Notes (Addendum)
Patient refusing bed/chair alarms - educated on safety. Also refusing CBG checks.

## 2014-12-06 NOTE — Care Management (Signed)
Patient continues to decline any facility placement because "I will lose my food stamps, my apartment and my check."  She is agreeable to have home health nurse and physical therapy.  Will need to be requalified for continuous home 02 as the sats obtained have now expired.  Anticipate discharge  11/18.

## 2014-12-06 NOTE — Care Management Important Message (Signed)
Important Message  Patient Details  Name: Kelsey Peterson MRN: [id number] Date of Birth: 09-19-1955   Medicare Important Message Given:  Yes    Katrina Stack, RN 12/06/2014, 6:09 PM

## 2014-12-06 NOTE — Care Management Note (Signed)
Patient is active at [place] on TTS.  I will send updated records to clinic at discharge.  Iran Sizer  Dialysis Liaison  [phone]

## 2014-12-06 NOTE — Progress Notes (Addendum)
ANTIBIOTIC CONSULT NOTE - FOLLOW UP   Pharmacy Consult for Vancomycin  Indication: pneumonia  Allergies  Allergen Reactions  . Contrast Media [Iodinated Diagnostic Agents] Other (See Comments)    Unknown reaction  . Morphine And Related Hives  . Other Rash    Blood Pressure Pill (Pt doesn't remember name)     Patient Measurements: Height: 5' 2.5" (158.8 cm) (stated) Weight: 189 lb 13.1 oz (86.1 kg) IBW/kg (Calculated) : 51.25 Adjusted Body Weight: 63.2  Vital Signs: Temp: 97.4 F (36.3 C) (11/17 1345) Temp Source: Oral (11/17 1345) BP: 166/89 mmHg (11/17 1345) Pulse Rate: 72 (11/17 1345) Intake/Output from previous day: 11/16 0701 - 11/17 0700 In: -  Out: 55 [Urine:50; Stool:5] Intake/Output from this shift: Total I/O In: -  Out: 1424 [Other:1424]  Labs:  Recent Labs  12/04/14 0410 12/05/14 0448 12/06/14 0409  WBC 8.8 8.2 7.9  HGB 8.1* 8.2* 8.6*  PLT 195 194 203  CREATININE 5.31* 6.52* 7.11*     Estimated Creatinine Clearance: 8.7 mL/min (by C-G formula based on Cr of 7.11).   Microbiology: Recent Results (from the past 720 hour(s))  Culture, blood (routine x 2)     Status: None   Collection Time: 12/01/14 11:34 AM  Result Value Ref Range Status   Specimen Description BLOOD LEFT ASSIST CONTROL  Final   Special Requests BAA,8ML,ANA,AER  Final   Culture  Setup Time   Final    GRAM POSITIVE COCCI IN BOTH AEROBIC AND ANAEROBIC BOTTLES CRITICAL RESULT CALLED TO, READ BACK BY AND VERIFIED WITH: JANE PHLUFMM AT [place] 12/02/14.PMH CONFIRMED BY RWW    Culture   Final    VIRIDANS STREPTOCOCCUS IN BOTH AEROBIC AND ANAEROBIC BOTTLES    Report Status 12/04/2014 FINAL  Final   Organism ID, Bacteria VIRIDANS STREPTOCOCCUS  Final      Susceptibility   Viridans streptococcus - MIC*    ERYTHROMYCIN Value in next row Resistant      RESISTANT>=8    TETRACYCLINE Value in next row Resistant      RESISTANT>=16    VANCOMYCIN Value in next row Sensitive    SENSITIVE0.5    CEFTRIAXONE Value in next row Sensitive      SENSITIVE<=0.25    PENICILLIN Value in next row Sensitive      SENSITIVE0.12    LEVOFLOXACIN Value in next row Sensitive      SENSITIVE2    * VIRIDANS STREPTOCOCCUS  Culture, blood (routine x 2)     Status: None   Collection Time: 12/01/14 12:01 PM  Result Value Ref Range Status   Specimen Description BLOOD LEFT ARM  Final   Special Requests BOTTLES DRAWN AEROBIC AND ANAEROBIC 5CC  Final   Culture  Setup Time   Final    GRAM POSITIVE COCCI IN BOTH AEROBIC AND ANAEROBIC BOTTLES CRITICAL RESULT CALLED TO, READ BACK BY AND VERIFIED WITH: JANE PHLUFMM AT [place] 12/02/14.PMH CONFIRMED BY RWW    Culture   Final    VIRIDANS STREPTOCOCCUS IN BOTH AEROBIC AND ANAEROBIC BOTTLES    Report Status 12/04/2014 FINAL  Final   Organism ID, Bacteria VIRIDANS STREPTOCOCCUS  Final      Susceptibility   Viridans streptococcus - MIC*    ERYTHROMYCIN Value in next row Resistant      RESISTANT>=8    TETRACYCLINE Value in next row Resistant      RESISTANT>=16    VANCOMYCIN Value in next row Sensitive      SENSITIVE0.5    PENICILLIN Value  in next row Sensitive      SENSITIVE0.12    CEFTRIAXONE Value in next row Sensitive      SENSITIVE0.25    LEVOFLOXACIN Value in next row Sensitive      SENSITIVE2    * VIRIDANS STREPTOCOCCUS  MRSA PCR Screening     Status: Abnormal   Collection Time: 12/01/14  3:17 PM  Result Value Ref Range Status   MRSA by PCR POSITIVE (A) NEGATIVE Final    Comment:        The GeneXpert MRSA Assay (FDA approved for NASAL specimens only), is one component of a comprehensive MRSA colonization surveillance program. It is not intended to diagnose MRSA infection nor to guide or monitor treatment for MRSA infections. CRITICAL RESULT CALLED TO, READ BACK BY AND VERIFIED WITH: MADDIE HIMES AT [id number] ON 12/01/14 BY [facility]   Cath Tip Culture     Status: None   Collection Time: 12/03/14  4:45 PM  Result Value Ref Range  Status   Specimen Description CATH TIP  Final   Special Requests NONE  Final   Culture NO GROWTH 3 DAYS  Final   Report Status 12/06/2014 FINAL  Final  C difficile quick scan w PCR reflex     Status: Abnormal   Collection Time: 12/04/14  2:00 PM  Result Value Ref Range Status   C Diff antigen POSITIVE (A) NEGATIVE Final   C Diff toxin NEGATIVE NEGATIVE Final   C Diff interpretation   Final    Negative for toxigenic C. difficile. Toxin gene and active toxin production not detected. May be a nontoxigenic strain of C. difficile bacteria present, lacking the ability to produce toxin.  Clostridium Difficile by PCR     Status: None   Collection Time: 12/04/14  2:00 PM  Result Value Ref Range Status   Toxigenic C Difficile by pcr NEGATIVE NEGATIVE Final  Culture, blood (routine x 2)     Status: None (Preliminary result)   Collection Time: 12/04/14  8:20 PM  Result Value Ref Range Status   Specimen Description BLOOD LEFT ARM  Final   Special Requests BOTTLES DRAWN AEROBIC AND ANAEROBIC 4CC  Final   Culture NO GROWTH 2 DAYS  Final   Report Status PENDING  Incomplete    Medical History: Past Medical History  Diagnosis Date  . COPD (chronic obstructive pulmonary disease) ([place])   . ESRD on hemodialysis ([place])   . DM2 (diabetes mellitus, type 2) ([place])   . Hepatitis C   . Migraine with aura   . HOH (hard of hearing)   . Mitral regurgitation     a. echo 06/2014: EF 60%, no WMA, GR1DD, mild AI, calcified mitral annulus, mod MR, no atrial septal defect or patent foramen ovale   . Cognitive impairment   . CAD (coronary artery disease)   . HTN (hypertension)     Assessment: Patient with ESRD requiring hemodialysis TTS, admitted for pneumonia, also with viridans strep bacteremia. Planning vanc for 4 weeks per ID MD.    Goal of Therapy:  Vancomycin trough pre-HD 15-25 mcg/ml  Plan:  Follow up culture results  Patient received Vancomycin 1500 mg IV x 1 loading dose on 11/12. Patient will  need Vancomycin 750  Mg IV qHD (Tues, Thurs, and Sat).  Bcx x2: GPC +: Strep Viridans.  HD today 11/17. Vanc not given in HD per HD RN. Will send vanc 750 mg IV x1 dose to floor to give on floor. Spoke with pt's RN.  Will order vanc trough pre-HD on 11/22. Will need to continue to follow HD schedule and charting of doses.   Pharmacy will continue to follow.    Rayna Sexton, PharmD, BCPS Clinical Pharmacist 12/06/2014 2:25 PM

## 2014-12-06 NOTE — Progress Notes (Signed)
[facility]  ROUNDING NOTE   Subjective:  Pt seen during dialysis. Access working well. BFR 400.     Objective:  Vital signs in last 24 hours:  Temp:  [97.3 F (36.3 C)-97.6 F (36.4 C)] 97.4 F (36.3 C) (11/17 1345) Pulse Rate:  [58-78] 72 (11/17 1345) Resp:  [16-22] 18 (11/17 1345) BP: (137-179)/(80-107) 166/89 mmHg (11/17 1345) SpO2:  [97 %-100 %] 97 % (11/17 1010) Weight:  [86.1 kg (189 lb 13.1 oz)-88.1 kg (194 lb 3.6 oz)] 86.1 kg (189 lb 13.1 oz) (11/17 1345)  Weight change:  Filed Weights   12/05/14 2015 12/06/14 1010 12/06/14 1345  Weight: 87.635 kg (193 lb 3.2 oz) 88.1 kg (194 lb 3.6 oz) 86.1 kg (189 lb 13.1 oz)    Intake/Output: I/O last 3 completed shifts: In: -  Out: 55 [Urine:50; Stool:5]   Intake/Output this shift:  Total I/O In: -  Out: [id number] [Other:1424]  Physical Exam: General: NAD, sitting up in bed  Head: Normocephalic, atraumatic. Moist oral mucosal membranes  Eyes: Anicteric  Neck: Supple, trachea midline  Lungs:  Clear to auscultation  Heart: Regular rate and rhythm  Abdomen:  Soft, nontender, BS present  Extremities:  1+ peripheral edema.  Neurologic: Nonfocal, moving all four extremities  Skin: No lesions  Access: RUE AV access    Basic Metabolic Panel:  Recent Labs Lab 12/02/14 0523 12/03/14 0419 12/04/14 0410 12/05/14 0448 12/06/14 0409  NA 134* 132* 128* 127* 125*  K 3.1* 3.6 3.2* 3.4* 3.5  CL 101 95* 91* 91* 88*  CO2 26 27 25 26 23   GLUCOSE 95 131* 90 84 85  BUN 12 17 20  26* 31*  CREATININE 3.76* 4.52* 5.31* 6.52* 7.11*  CALCIUM 8.5* 8.6* 8.2* 7.6* 7.9*  PHOS  --   --  4.6  --   --     Liver Function Tests:  Recent Labs Lab 12/01/14 1132 12/02/14 0523 12/03/14 0419 12/04/14 0410  AST 34 24 28 23   ALT 19 16 20 18   ALKPHOS 76 56 58 55  BILITOT 2.7* 2.3* 1.9* 1.4*  PROT 7.6 6.1* 7.0 6.7  ALBUMIN 3.0* 2.6* 2.9* 2.6*    Recent Labs Lab 12/01/14 1132  LIPASE 25   No results for input(s):  AMMONIA in the last 168 hours.  CBC:  Recent Labs Lab 12/02/14 0523 12/02/14 1746 12/03/14 0419 12/04/14 0410 12/05/14 0448 12/06/14 0409  WBC 14.9*  --  10.6 8.8 8.2 7.9  HGB 6.4* 8.4* 8.4* 8.1* 8.2* 8.6*  HCT 21.3* 26.8* 25.1* 25.0* 25.1* 25.9*  MCV 84.4  --  83.1 82.6 82.3 81.6  PLT 168  --  208 195 194 203    Cardiac Enzymes:  Recent Labs Lab 12/01/14 1132  TROPONINI 0.62*    BNP: Invalid input(s): POCBNP  CBG: No results for input(s): GLUCAP in the last 168 hours.  Microbiology: Results for orders placed or performed during the hospital encounter of 12/01/14  Culture, blood (routine x 2)     Status: None   Collection Time: 12/01/14 11:34 AM  Result Value Ref Range Status   Specimen Description BLOOD LEFT ASSIST CONTROL  Final   Special Requests BAA,8ML,ANA,AER  Final   Culture  Setup Time   Final    GRAM POSITIVE COCCI IN BOTH AEROBIC AND ANAEROBIC BOTTLES CRITICAL RESULT CALLED TO, READ BACK BY AND VERIFIED WITH: JANE PHLUFMM AT [place] 12/02/14.PMH CONFIRMED BY RWW    Culture   Final    VIRIDANS STREPTOCOCCUS IN  BOTH AEROBIC AND ANAEROBIC BOTTLES    Report Status 12/04/2014 FINAL  Final   Organism ID, Bacteria VIRIDANS STREPTOCOCCUS  Final      Susceptibility   Viridans streptococcus - MIC*    ERYTHROMYCIN Value in next row Resistant      RESISTANT>=8    TETRACYCLINE Value in next row Resistant      RESISTANT>=16    VANCOMYCIN Value in next row Sensitive      SENSITIVE0.5    CEFTRIAXONE Value in next row Sensitive      SENSITIVE<=0.25    PENICILLIN Value in next row Sensitive      SENSITIVE0.12    LEVOFLOXACIN Value in next row Sensitive      SENSITIVE2    * VIRIDANS STREPTOCOCCUS  Culture, blood (routine x 2)     Status: None   Collection Time: 12/01/14 12:01 PM  Result Value Ref Range Status   Specimen Description BLOOD LEFT ARM  Final   Special Requests BOTTLES DRAWN AEROBIC AND ANAEROBIC 5CC  Final   Culture  Setup Time   Final    GRAM  POSITIVE COCCI IN BOTH AEROBIC AND ANAEROBIC BOTTLES CRITICAL RESULT CALLED TO, READ BACK BY AND VERIFIED WITH: JANE PHLUFMM AT [place] 12/02/14.PMH CONFIRMED BY RWW    Culture   Final    VIRIDANS STREPTOCOCCUS IN BOTH AEROBIC AND ANAEROBIC BOTTLES    Report Status 12/04/2014 FINAL  Final   Organism ID, Bacteria VIRIDANS STREPTOCOCCUS  Final      Susceptibility   Viridans streptococcus - MIC*    ERYTHROMYCIN Value in next row Resistant      RESISTANT>=8    TETRACYCLINE Value in next row Resistant      RESISTANT>=16    VANCOMYCIN Value in next row Sensitive      SENSITIVE0.5    PENICILLIN Value in next row Sensitive      SENSITIVE0.12    CEFTRIAXONE Value in next row Sensitive      SENSITIVE0.25    LEVOFLOXACIN Value in next row Sensitive      SENSITIVE2    * VIRIDANS STREPTOCOCCUS  MRSA PCR Screening     Status: Abnormal   Collection Time: 12/01/14  3:17 PM  Result Value Ref Range Status   MRSA by PCR POSITIVE (A) NEGATIVE Final    Comment:        The GeneXpert MRSA Assay (FDA approved for NASAL specimens only), is one component of a comprehensive MRSA colonization surveillance program. It is not intended to diagnose MRSA infection nor to guide or monitor treatment for MRSA infections. CRITICAL RESULT CALLED TO, READ BACK BY AND VERIFIED WITH: MADDIE HIMES AT [id number] ON 12/01/14 BY [facility]   Cath Tip Culture     Status: None   Collection Time: 12/03/14  4:45 PM  Result Value Ref Range Status   Specimen Description CATH TIP  Final   Special Requests NONE  Final   Culture NO GROWTH 3 DAYS  Final   Report Status 12/06/2014 FINAL  Final  C difficile quick scan w PCR reflex     Status: Abnormal   Collection Time: 12/04/14  2:00 PM  Result Value Ref Range Status   C Diff antigen POSITIVE (A) NEGATIVE Final   C Diff toxin NEGATIVE NEGATIVE Final   C Diff interpretation   Final    Negative for toxigenic C. difficile. Toxin gene and active toxin production not detected. May be a  nontoxigenic strain of C. difficile bacteria present, lacking the ability  to produce toxin.  Clostridium Difficile by PCR     Status: None   Collection Time: 12/04/14  2:00 PM  Result Value Ref Range Status   Toxigenic C Difficile by pcr NEGATIVE NEGATIVE Final  Culture, blood (routine x 2)     Status: None (Preliminary result)   Collection Time: 12/04/14  8:20 PM  Result Value Ref Range Status   Specimen Description BLOOD LEFT ARM  Final   Special Requests BOTTLES DRAWN AEROBIC AND ANAEROBIC 4CC  Final   Culture NO GROWTH 2 DAYS  Final   Report Status PENDING  Incomplete    Coagulation Studies: No results for input(s): LABPROT, INR in the last 72 hours.  Urinalysis: No results for input(s): COLORURINE, LABSPEC, PHURINE, GLUCOSEU, HGBUR, BILIRUBINUR, KETONESUR, PROTEINUR, UROBILINOGEN, NITRITE, LEUKOCYTESUR in the last 72 hours.  Invalid input(s): APPERANCEUR    Imaging: No results found.   Medications:   . sodium chloride     . amiodarone  200 mg Oral BID  . amLODipine  5 mg Oral Daily  . calcium carbonate  1 tablet Oral BID PC  . Chlorhexidine Gluconate Cloth  6 each Topical Q0600  . Chlorhexidine Gluconate Cloth  6 each Topical Once  . cinacalcet  60 mg Oral Q breakfast  . docusate sodium  100 mg Oral BID  . epoetin (EPOGEN/PROCRIT) injection  10,000 Units Intravenous Q T,Th,Sa-HD  . ferrous sulfate  325 mg Oral Daily  . folic acid-vitamin b complex-vitamin c-selenium-zinc  1 tablet Oral Daily  . furosemide  80 mg Oral Daily  . insulin aspart  0-5 Units Subcutaneous QHS  . insulin aspart  0-9 Units Subcutaneous TID WC  . isosorbide mononitrate  30 mg Oral Daily  . metoprolol tartrate  12.5 mg Oral BID  . mometasone-formoterol  2 puff Inhalation BID  . mupirocin ointment  1 application Nasal BID  . pantoprazole  40 mg Oral Daily  . vancomycin  750 mg Intravenous Once per day on Tue Thu Sat   sodium chloride, sodium chloride, acetaminophen, albuterol, alteplase,  alum & mag hydroxide-simeth, heparin, hydrALAZINE, lidocaine (PF), Lidocaine-Prilocaine (Bulk), lidocaine-prilocaine, ondansetron (ZOFRAN) IV, oxyCODONE, pentafluoroprop-tetrafluoroeth, sodium chloride, traMADol  Assessment/ Plan:  59 y.o. 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  ESRD on HD TTHS s/p permcath removal. We have used her existing dialysis access today and appears to be working well, BFR 400 at the moment.  Will notify of vascular surgery of this.   2.  Anemia of CKD/anemia blood loss: hgb currently 8.6.  Restart epogen 10000 units IV with HD.   3.  SHPTH:  Continue sensipar and calcium carbonate, phos well controlled at 4.6.   4.  Sepsis: Streptococcus viridans noted in the blood. Continue vancomycin.    5.  Left sided bacterial pneumonia:  This was noted on prior CT scan and current CXR. -Streptococcus viridans most likely etiology of this. As above defer anabolic therapy to hospitalist.     LOS: 5 Torrian Canion 11/17/20162:06 PM

## 2014-12-06 NOTE — Progress Notes (Signed)
Pt rested quietly today, was able to complete dialysis. No complaints of pain. PT attempted therapy - awaiting note. Pt continued to refuse bed/chair alarms, medications and CBG checks today.

## 2014-12-06 NOTE — Progress Notes (Signed)
Patient charger returned to patient.

## 2014-12-06 NOTE — Progress Notes (Signed)
Per Pharmacist, give vanc order IV since it was not given during dialysis

## 2014-12-06 NOTE — Progress Notes (Signed)
Patient transferring to 1A Rm 135. Report called to [employer].

## 2014-12-06 NOTE — Evaluation (Signed)
Physical Therapy Evaluation Patient Details Name: Kelsey Peterson MRN: [id number] DOB: [birth year] Today's Date: 12/06/2014   History of Present Illness  presented to ER from dialysis secondary to SOB and fever; admitted with sepsis secondary to L PNA and bacteremia.  Hospital course significant for R IJ permcath removal (11/14), now using R UE AVF.    Clinical Impression  Upon evaluation, patient alert and oriented; somewhat agitated at times, but agreeable to OOB to chair with mod encouragement from therapist.  Demonstrates strength and ROM grossly WFL throughout all extremities; limited cooperation with formal assessment.  Able to complete bed mobility with min assist; sit/stand, bed/chair transfer with 4WRW, cga. Appears fairly steady without balance loss of noted safety concern.  Patient refused further distance/activity.] Would benefit from skilled PT to address above deficits and promote optimal return to PLOF; anticipate patient okay for home with HHPT (will continue to assess as patient allows additional mobility), feel patient likely to refuse additional options if presented.    Follow Up Recommendations Home health PT (will continue to assess as patient allows additional mobility; anticipate patient likely to refuse any additional options)    Equipment Recommendations       Recommendations for Other Services       Precautions / Restrictions Precautions Precautions: Fall Precaution Comments: enteric isolation Restrictions Weight Bearing Restrictions: No      Mobility  Bed Mobility   Bed Mobility: Supine to Sit     Supine to sit: Min assist        Transfers Overall transfer level: Needs assistance Equipment used: 4-wheeled walker Transfers: Sit to/from Stand Sit to Stand: Min guard            Ambulation/Gait Ambulation/Gait assistance: Min guard Ambulation Distance (Feet): 5 Feet Assistive device: 4-wheeled walker       General Gait Details: forward  flexed posture, short/shuffling steps; fairly steady without obvious balance loss or safety concern.  Refused further distance.  Stairs            Wheelchair Mobility    Modified Rankin (Stroke Patients Only)       Balance Overall balance assessment: Needs assistance Sitting-balance support: No upper extremity supported;Feet supported Sitting balance-Leahy Scale: Good     Standing balance support: Bilateral upper extremity supported Standing balance-Leahy Scale: Fair                               Pertinent Vitals/Pain Pain Assessment: No/denies pain    Home Living Family/patient expects to be discharged to:: Private residence Living Arrangements: Alone Available Help at Discharge: Friend(s) Type of Home: Apartment Home Access: Stairs to enter Entrance Stairs-Rails: None Entrance Stairs-Number of Steps: 1+1 Home Layout: One level Home Equipment: Environmental consultant - 4 wheels;Cane - single point      Prior Function Level of Independence: Independent with assistive device(s)         Comments: Pt uses SPC within home environment, 4WRW within community, ambulates mostly household distances; does endorse 2 falls in recent months.     Hand Dominance        Extremity/Trunk Assessment   Upper Extremity Assessment: Overall WFL for tasks assessed           Lower Extremity Assessment: Overall WFL for tasks assessed         Communication   Communication: HOH (prefers communication through lip reading from therapist)  Cognition Arousal/Alertness: Awake/alert Behavior During Therapy: [facility] for tasks assessed/performed (  very self-directive of care) Overall Cognitive Status: Within Functional Limits for tasks assessed                      General Comments      Exercises        Assessment/Plan    PT Assessment Patient needs continued PT services  PT Diagnosis Difficulty walking;Generalized weakness   PT Problem List Decreased  strength;Decreased activity tolerance;Decreased balance;Decreased mobility;Decreased knowledge of use of DME;Decreased safety awareness;Decreased knowledge of precautions;Obesity;Cardiopulmonary status limiting activity  PT Treatment Interventions DME instruction;Gait training;Stair training;Functional mobility training;Therapeutic activities;Patient/family education;Therapeutic exercise;Balance training   PT Goals (Current goals can be found in the Care Plan section) Acute Rehab PT Goals Patient Stated Goal: "to return home" PT Goal Formulation: With patient Time For Goal Achievement: 12/20/14 Potential to Achieve Goals: Good    Frequency Min 2X/week   Barriers to discharge Decreased caregiver support      Co-evaluation               End of Session   Activity Tolerance: Patient tolerated treatment well Patient left: in chair;with call bell/phone within reach (refusing chair alarm) Nurse Communication: Mobility status         Time: [id number] PT Time Calculation (min) (ACUTE ONLY): 13 min   Charges:   PT Evaluation $Initial PT Evaluation Tier I: 1 Procedure     PT G Codes:        Theophile Harvie H. Owens Shark, PT, DPT, NCS 12/06/2014, 5:02 PM [phone]

## 2014-12-06 NOTE — Progress Notes (Signed)
[place] [facility] [place] NAME: Kelsey Peterson    MR#:  [id number]  DATE OF BIRTH:  [birth year]  SUBJECTIVE:  Feels ok, no new c/o. Getting HD today  REVIEW OF SYSTEMS:    Review of Systems  Constitutional: Negative for fever and chills.  HENT: Negative for congestion and tinnitus.   Eyes: Negative for blurred vision and double vision.  Respiratory: Negative for cough, shortness of breath and wheezing.   Cardiovascular: Negative for chest pain, orthopnea and PND.  Gastrointestinal: Positive for nausea. Negative for diarrhea.  Genitourinary: Negative for dysuria and hematuria.  Neurological: Negative for dizziness, sensory change and focal weakness.  All other systems reviewed and are negative.   Nutrition: Renal Tolerating Diet: Yes Tolerating PT: Await evaluation DRUG ALLERGIES:   Allergies  Allergen Reactions  . Contrast Media [Iodinated Diagnostic Agents] Other (See Comments)    Unknown reaction  . Morphine And Related Hives  . Other Rash    Blood Pressure Pill (Pt doesn't remember name)     VITALS:  Blood pressure 156/72, pulse 65, temperature 98.7 F (37.1 C), temperature source Oral, resp. rate 18, height 5' 2.5" (1.588 m), weight 86.2 kg (190 lb 0.6 oz), SpO2 96 %. PHYSICAL EXAMINATION:  Physical Exam  GENERAL:  59 y.o.-year-old obese patient lying in the bed with no acute distress.  EYES: Pupils equal, round, reactive to light and accommodation. No scleral icterus. Extraocular muscles intact.  HEENT: Head atraumatic, normocephalic. Oropharynx and nasopharynx clear.  NECK:  Supple, no jugular venous distention. No thyroid enlargement, no tenderness.  LUNGS: Normal breath sounds bilaterally, no wheezing, rales, rhonchi. No use of accessory muscles of respiration.  CARDIOVASCULAR: S1, S2 normal. No murmurs, rubs, or gallops.  ABDOMEN: Soft, nontender, nondistended. Bowel sounds present. No organomegaly or mass.   EXTREMITIES: No cyanosis, clubbing or edema b/l.    NEUROLOGIC: Cranial nerves II through XII are intact. No focal Motor or sensory deficits b/l. Globally weak  PSYCHIATRIC: The patient is alert and oriented x 2. Agitated at times.  SKIN: No obvious rash, lesion, or ulcer.  Right chest dialysis catheter in place. Removal of right jugular Permcath (11/14 by Dr Lucky Cowboy) and dressing in place & intact.   LABORATORY PANEL:   CBC  Recent Labs Lab 12/06/14 0409  WBC 7.9  HGB 8.6*  HCT 25.9*  PLT 203   ------------------------------------------------------------------------------------------------------------------  Chemistries   Recent Labs Lab 12/04/14 0410  12/06/14 0409  NA 128*  < > 125*  K 3.2*  < > 3.5  CL 91*  < > 88*  CO2 25  < > 23  GLUCOSE 90  < > 85  BUN 20  < > 31*  CREATININE 5.31*  < > 7.11*  CALCIUM 8.2*  < > 7.9*  AST 23  --   --   ALT 18  --   --   ALKPHOS 55  --   --   BILITOT 1.4*  --   --   < > = values in this interval not displayed. ------------------------------------------------------------------------------------------------------------------  Cardiac Enzymes  Recent Labs Lab 12/01/14 1132  TROPONINI 0.62*   ASSESSMENT AND PLAN:   59 year old female with past medical history of end-stage renal disease on hemodialysis, COPD, type 2 diabetes, hepatitis C, coronary artery disease, cognitive impairment, who presented to the hospital due to fever and shortness of breath from dialysis.  #1 Strep Viridans sepsis-present on admission-Continue IV vancomycin. Blood cultures are growing  - Appreciate  infectious disease input - will need 4 weeks of IV vanco with HD - Removal of right jugular Permcath (11/14) - Dr Lucky Cowboy - repeat blood c/s from 11/15 neg thus far.  #2 pneumonia-likely source of patient's sepsis. Continue IV vancomycin  #3 cholelithiasis-patient had abdominal ultrasound which showed gallstones but no evidence of acute cholecystitis.  #4  end-stage renal disease on hemodialysis-nephrology has been consulted. -Continue dialysis on Tuesday Thursday Saturday schedule as per nephrology. Permcath(RT JUGULAR) removed by Dr Lucky Cowboy on 11/14  #5 secondary hyperparathyroidism-continue Sensipar  #6 hypertension-continue metoprolol, Imdur, Norvasc.  #7 COPD-no acute exacerbation. Continue Dulera, when necessary DuoNeb's.  #8 GERD-continue Protonix.  #9 type 2 diabetes-continue sliding scale insulin.  #10 anemia of chronic disease-likely secondary to end-stage renal disease. -Hemoglobin down to 6.4 on 11/13 - s/p 1 unit of PRBC transfusion and follow hemoglobin. It's 8.1 today  # 11 Throat pain: still feels same, will ask ST to eval her  All the records are reviewed and case discussed with Care Management/Social Workerr. Management plans discussed with the patient, family and they are in agreement.  CODE STATUS: Full  consult palliative care. Await PT eval - as she may need placement - doubt she will go though  DVT Prophylaxis: Teds and SCDs  TOTAL TIME TAKING CARE OF THIS PATIENT: 30 minutes.   POSSIBLE D/C IN AM, DEPENDING ON CLINICAL CONDITION.   [facility], Narda Fundora M.D on 12/06/2014 at 2:50 PM  Between 7am to 6pm - Pager - [phone]  After 6pm go to [email or web address] - password [email or web address] [facility]  Office  [phone]  CC: Primary care physician; Lavera Guise, MD

## 2014-12-06 NOTE — Progress Notes (Signed)
Received patient at 63. Oriented her to room vitals done belongings put away. Called 2A regarding patients cell phone charger which the patient states she left in her room 244. Staff on 2A  Are looking and will tube to [place] when they find it.

## 2014-12-06 NOTE — Progress Notes (Signed)
ID E note  NO further fevers.  [place] 11/15 negative to date  C diff PCR neg (Antigen +) so unlikely C diff as cause of diarrhea  Rec Will need 4 weeks vanco at HD from 11/15 - stop date 12/13. Following completion of 4 weeks treatment - from date of first neg bcx - would repeat bcx as subsequent HD sessions x 2 to ensure clearance No need for TEE since will treat 4 weeks either way.

## 2014-12-07 LAB — CBC
HEMATOCRIT: 30.2 % — AB (ref 35.0–47.0)
Hemoglobin: 9.8 g/dL — ABNORMAL LOW (ref 12.0–16.0)
MCH: 27 pg (ref 26.0–34.0)
MCHC: 32.6 g/dL (ref 32.0–36.0)
MCV: 83 fL (ref 80.0–100.0)
Platelets: 221 10*3/uL (ref 150–440)
RBC: 3.64 MIL/uL — ABNORMAL LOW (ref 3.80–5.20)
RDW: 23.8 % — AB (ref 11.5–14.5)
WBC: 9.1 10*3/uL (ref 3.6–11.0)

## 2014-12-07 LAB — BASIC METABOLIC PANEL
Anion gap: 11 (ref 5–15)
BUN: 14 mg/dL (ref 6–20)
CALCIUM: 8.4 mg/dL — AB (ref 8.9–10.3)
CO2: 29 mmol/L (ref 22–32)
CREATININE: 4.36 mg/dL — AB (ref 0.44–1.00)
Chloride: 91 mmol/L — ABNORMAL LOW (ref 101–111)
GFR calc Af Amer: 12 mL/min — ABNORMAL LOW (ref >60)
GFR calc non Af Amer: 10 mL/min — ABNORMAL LOW (ref >60)
GLUCOSE: 72 mg/dL (ref 65–99)
Potassium: 3.3 mmol/L — ABNORMAL LOW (ref 3.5–5.1)
Sodium: 131 mmol/L — ABNORMAL LOW (ref 135–145)

## 2014-12-07 MED ORDER — VANCOMYCIN HCL IN DEXTROSE 750-5 MG/150ML-% IV SOLN: 750.0000 mg | INTRAVENOUS | Status: DC | PRN

## 2014-12-07 NOTE — Discharge Instructions (Signed)
Bacteremia °Bacteremia is the presence of bacteria in the blood. A small amount of bacteria may not cause any symptoms. °Sometimes, the bacteria spread and cause infection in other parts of the body, such as the heart, joints, bones, or brain. Having a great amount of bacteria can cause a serious, sometimes life-threatening infection called sepsis. °CAUSES °This condition is caused by bacteria that get into the blood. Bacteria can enter the blood: °· During a dental or medical procedure. °· After you brush your teeth so hard that the gums bleed. °· Through a scrape or cut on your skin. °More severe types of bacteremia can be caused by: °· A bacterial infection, such as pneumonia, that spreads to the blood. °· Using a dirty needle. °RISK FACTORS °This condition is more likely to develop in: °· Children and elderly adults. °· People who have a long-lasting (chronic) disease or medical condition. °· People who have an artificial joint or heart valve. °· People who have heart valve disease. °· People who have a tube, such as a catheter or IV tube, that has been inserted for a medical treatment. °· People who have a weak body defense system (immune system). °· People who use IV drugs. °SYMPTOMS °Usually, this condition does not cause symptoms when it is mild. When it is more serious, it may cause: °· Fever. °· Chills. °· Racing heart. °· Shortness of breath. °· Dizziness. °· Weakness. °· Confusion. °· Nausea or vomiting. °· Diarrhea. °Bacteremia that has spread to other parts of the body may cause symptoms in those areas. °DIAGNOSIS °This condition may be diagnosed with a physical exam and tests, such as: °· A complete blood count (CBC). This test looks for signs of infection. °· Blood cultures. These look for bacteria in your blood. °· Tests of any IV tubes. These look for a source of infection. °· Urine tests. °· Imaging tests, such as an X-ray, CT scan, MRI, or heart ultrasound. °TREATMENT °If the condition is mild,  treatment is usually not needed. Usually, the body's immune system will remove the bacteria. If the condition is more serious, it may be treated with: °· Antibiotic medicines through an IV tube. These may be given for about 2 weeks. At first, the antibiotic that is given may kill most types of blood bacteria. If your test results show that a certain kind of bacteria is causing problems, the antibiotic may be changed to kill only the bacteria that are causing problems. °· Antibiotics taken by mouth. °· Removing any catheter or IV tube that is a source of infection. °· Blood pressure and breathing support, if needed. °· Surgery to control the source or spread of infection, if needed. °HOME CARE INSTRUCTIONS °· Take over-the-counter and prescription medicines only as told by your health care provider. °· If you were prescribed an antibiotic, take it as told by your health care provider. Do not stop taking the antibiotic even if you start to feel better. °· Rest at home until your condition is under control. °· Drink enough fluid to keep your urine clear or pale yellow. °· Keep all follow-up visits as told by your health care provider. This is important. °PREVENTION °Take these actions to help prevent future episodes of bacteremia: °· Get all vaccinations as recommended by your health care provider. °· Clean and cover scrapes or cuts. °· Bathe regularly. °· Wash your hands often. °· Before any dental or surgical procedure, ask your health care provider if you should take an antibiotic. °SEEK MEDICAL   CARE IF: °· Your symptoms get worse. °· You continue to have symptoms after treatment. °· You develop new symptoms after treatment. °SEEK IMMEDIATE MEDICAL CARE IF: °· You have chest pain or trouble breathing. °· You develop confusion, dizziness, or weakness. °· You develop pale skin. °  °This information is not intended to replace advice given to you by your health care provider. Make sure you discuss any questions you have  with your health care provider. °  °Document Released: 10/19/2005 Document Revised: 09/26/2014 Document Reviewed: 03/10/2014 °Elsevier Interactive Patient Education ©2016 [employer] Inc[employer] ° °

## 2014-12-07 NOTE — Progress Notes (Signed)
Patient took bandage off of her right upper arm. RN tried to redress it and the patient refused. Stating that if  it wasn't bleeding she didn't want the bandage replaced.

## 2014-12-07 NOTE — Progress Notes (Signed)
ANTIBIOTIC CONSULT NOTE - FOLLOW UP   Pharmacy Consult for Vancomycin  Indication: pneumonia  Allergies  Allergen Reactions  . Contrast Media [Iodinated Diagnostic Agents] Other (See Comments)    Unknown reaction  . Morphine And Related Hives  . Other Rash    Blood Pressure Pill (Pt doesn't remember name)     Patient Measurements: Height: 5' 2.5" (158.8 cm) (stated) Weight: 190 lb 0.6 oz (86.2 kg) IBW/kg (Calculated) : 51.25 Adjusted Body Weight: 63.2  Vital Signs: Temp: 98.3 F (36.8 C) (11/18 0820) Temp Source: Oral (11/18 0820) BP: 162/78 mmHg (11/18 0820) Pulse Rate: 80 (11/18 0820) Intake/Output from previous day: 11/17 0701 - 11/18 0700 In: 150 [IV Piggyback:150] Out: 1426 [Stool:2] Intake/Output from this shift:    Labs:  Recent Labs  12/05/14 0448 12/06/14 0409 12/07/14 0402  WBC 8.2 7.9 9.1  HGB 8.2* 8.6* 9.8*  PLT 194 203 221  CREATININE 6.52* 7.11* 4.36*     Estimated Creatinine Clearance: 14.1 mL/min (by C-G formula based on Cr of 4.36).   Microbiology: Recent Results (from the past 720 hour(s))  Culture, blood (routine x 2)     Status: None   Collection Time: 12/01/14 11:34 AM  Result Value Ref Range Status   Specimen Description BLOOD LEFT ASSIST CONTROL  Final   Special Requests BAA,8ML,ANA,AER  Final   Culture  Setup Time   Final    GRAM POSITIVE COCCI IN BOTH AEROBIC AND ANAEROBIC BOTTLES CRITICAL RESULT CALLED TO, READ BACK BY AND VERIFIED WITH: JANE PHLUFMM AT [place] 12/02/14.PMH CONFIRMED BY RWW    Culture   Final    VIRIDANS STREPTOCOCCUS IN BOTH AEROBIC AND ANAEROBIC BOTTLES    Report Status 12/04/2014 FINAL  Final   Organism ID, Bacteria VIRIDANS STREPTOCOCCUS  Final      Susceptibility   Viridans streptococcus - MIC*    ERYTHROMYCIN Value in next row Resistant      RESISTANT>=8    TETRACYCLINE Value in next row Resistant      RESISTANT>=16    VANCOMYCIN Value in next row Sensitive      SENSITIVE0.5    CEFTRIAXONE Value  in next row Sensitive      SENSITIVE<=0.25    PENICILLIN Value in next row Sensitive      SENSITIVE0.12    LEVOFLOXACIN Value in next row Sensitive      SENSITIVE2    * VIRIDANS STREPTOCOCCUS  Culture, blood (routine x 2)     Status: None   Collection Time: 12/01/14 12:01 PM  Result Value Ref Range Status   Specimen Description BLOOD LEFT ARM  Final   Special Requests BOTTLES DRAWN AEROBIC AND ANAEROBIC 5CC  Final   Culture  Setup Time   Final    GRAM POSITIVE COCCI IN BOTH AEROBIC AND ANAEROBIC BOTTLES CRITICAL RESULT CALLED TO, READ BACK BY AND VERIFIED WITH: JANE PHLUFMM AT [facility] 12/02/14.PMH CONFIRMED BY RWW    Culture   Final    VIRIDANS STREPTOCOCCUS IN BOTH AEROBIC AND ANAEROBIC BOTTLES    Report Status 12/04/2014 FINAL  Final   Organism ID, Bacteria VIRIDANS STREPTOCOCCUS  Final      Susceptibility   Viridans streptococcus - MIC*    ERYTHROMYCIN Value in next row Resistant      RESISTANT>=8    TETRACYCLINE Value in next row Resistant      RESISTANT>=16    VANCOMYCIN Value in next row Sensitive      SENSITIVE0.5    PENICILLIN Value in next row  Sensitive      SENSITIVE0.12    CEFTRIAXONE Value in next row Sensitive      SENSITIVE0.25    LEVOFLOXACIN Value in next row Sensitive      SENSITIVE2    * VIRIDANS STREPTOCOCCUS  MRSA PCR Screening     Status: Abnormal   Collection Time: 12/01/14  3:17 PM  Result Value Ref Range Status   MRSA by PCR POSITIVE (A) NEGATIVE Final    Comment:        The GeneXpert MRSA Assay (FDA approved for NASAL specimens only), is one component of a comprehensive MRSA colonization surveillance program. It is not intended to diagnose MRSA infection nor to guide or monitor treatment for MRSA infections. CRITICAL RESULT CALLED TO, READ BACK BY AND VERIFIED WITH: MADDIE HIMES AT [id number] ON 12/01/14 BY [facility]   Cath Tip Culture     Status: None   Collection Time: 12/03/14  4:45 PM  Result Value Ref Range Status   Specimen Description CATH  TIP  Final   Special Requests NONE  Final   Culture NO GROWTH 3 DAYS  Final   Report Status 12/06/2014 FINAL  Final  C difficile quick scan w PCR reflex     Status: Abnormal   Collection Time: 12/04/14  2:00 PM  Result Value Ref Range Status   C Diff antigen POSITIVE (A) NEGATIVE Final   C Diff toxin NEGATIVE NEGATIVE Final   C Diff interpretation   Final    Negative for toxigenic C. difficile. Toxin gene and active toxin production not detected. May be a nontoxigenic strain of C. difficile bacteria present, lacking the ability to produce toxin.  Clostridium Difficile by PCR     Status: None   Collection Time: 12/04/14  2:00 PM  Result Value Ref Range Status   Toxigenic C Difficile by pcr NEGATIVE NEGATIVE Final  Culture, blood (routine x 2)     Status: None (Preliminary result)   Collection Time: 12/04/14  8:20 PM  Result Value Ref Range Status   Specimen Description BLOOD LEFT ARM  Final   Special Requests BOTTLES DRAWN AEROBIC AND ANAEROBIC 4CC  Final   Culture NO GROWTH 3 DAYS  Final   Report Status PENDING  Incomplete    Medical History: Past Medical History  Diagnosis Date  . COPD (chronic obstructive pulmonary disease) ([place])   . ESRD on hemodialysis ([place])   . DM2 (diabetes mellitus, type 2) ([place])   . Hepatitis C   . Migraine with aura   . HOH (hard of hearing)   . Mitral regurgitation     a. echo 06/2014: EF 60%, no WMA, GR1DD, mild AI, calcified mitral annulus, mod MR, no atrial septal defect or patent foramen ovale   . Cognitive impairment   . CAD (coronary artery disease)   . HTN (hypertension)     Assessment: Patient with ESRD requiring hemodialysis TTS, admitted for pneumonia, also with viridans strep bacteremia. Planning vanc for 4 weeks per ID MD.    Goal of Therapy:  Vancomycin trough pre-HD 15-25 mcg/ml  Plan:  Follow up culture results  Patient received Vancomycin 1500 mg IV x 1 loading dose on 11/12. Patient will need Vancomycin 750  Mg IV qHD (Tues,  Thurs, and Sat).  Bcx x2: GPC +: Strep Viridans.  ID MD on board.   11/17: Vanc not given in HD per HD RN. Will send vanc 750 mg IV x1 dose to floor to give on floor. Spoke with pt's  RN. Will order vanc trough pre-HD on 11/22. Will need to continue to follow HD schedule and charting of doses.   11/18: Continue current plan of Vancomycin 750 mg QHD (tues, thurs, and sat)  Pharmacy will continue to follow.    Larene Beach, PharmD  Clinical Pharmacist 12/07/2014 11:32 AM

## 2014-12-07 NOTE — Progress Notes (Signed)
Pt and son given dc instructions, follow up appt. Medication instructions. Pt's son aware that pt did not take daily meds today and that she needs to take them at home tonight. Pt and son verbalize understanding of all instructions. Dc home via wc with portable O2

## 2014-12-07 NOTE — Care Management (Signed)
O2 requested from Will with [facility]. Home health orders faxed to [facility] [phone]- they have agreed to see this patient on Monday afterr 10am per patient request. No further RNCM needs. [facility] dialysis liaison notified of Vanco need at dialysis.

## 2014-12-07 NOTE — Progress Notes (Signed)
[facility]  ROUNDING NOTE   Subjective:  patient had dialysis yesterday. In good spirits today. Her access worked well yesterday.    Objective:  Vital signs in last 24 hours:  Temp:  [97 F (36.1 C)-98.7 F (37.1 C)] 98.3 F (36.8 C) (11/18 0820) Pulse Rate:  [58-82] 80 (11/18 0820) Resp:  [17-20] 17 (11/18 0820) BP: (140-179)/(72-96) 162/78 mmHg (11/18 0820) SpO2:  [81 %-98 %] 92 % (11/18 1040) Weight:  [86.1 kg (189 lb 13.1 oz)-86.2 kg (190 lb 0.6 oz)] 86.2 kg (190 lb 0.6 oz) (11/17 1439)  Weight change: 0.465 kg (1 lb 0.4 oz) Filed Weights   12/06/14 1010 12/06/14 1345 12/06/14 1439  Weight: 88.1 kg (194 lb 3.6 oz) 86.1 kg (189 lb 13.1 oz) 86.2 kg (190 lb 0.6 oz)    Intake/Output: I/O last 3 completed shifts: In: 150 [IV Piggyback:150] Out: [id number] [Urine:50; ZA:[id number]; Stool:2]   Intake/Output this shift:     Physical Exam: General: NAD, sitting up in bed  Head: Normocephalic, atraumatic. Moist oral mucosal membranes  Eyes: Anicteric  Neck: Supple, trachea midline  Lungs:  Clear to auscultation  Heart: Regular rate and rhythm  Abdomen:  Soft, nontender, BS present  Extremities:  1+ peripheral edema.  Neurologic: Nonfocal, moving all four extremities  Skin: No lesions  Access: RUE AV access    Basic Metabolic Panel:  Recent Labs Lab 12/03/14 0419 12/04/14 0410 12/05/14 0448 12/06/14 0409 12/07/14 0402  NA 132* 128* 127* 125* 131*  K 3.6 3.2* 3.4* 3.5 3.3*  CL 95* 91* 91* 88* 91*  CO2 27 25 26 23 29   GLUCOSE 131* 90 84 85 72  BUN 17 20 26* 31* 14  CREATININE 4.52* 5.31* 6.52* 7.11* 4.36*  CALCIUM 8.6* 8.2* 7.6* 7.9* 8.4*  PHOS  --  4.6  --   --   --     Liver Function Tests:  Recent Labs Lab 12/01/14 1132 12/02/14 0523 12/03/14 0419 12/04/14 0410  AST 34 24 28 23   ALT 19 16 20 18   ALKPHOS 76 56 58 55  BILITOT 2.7* 2.3* 1.9* 1.4*  PROT 7.6 6.1* 7.0 6.7  ALBUMIN 3.0* 2.6* 2.9* 2.6*    Recent Labs Lab 12/01/14 1132   LIPASE 25   No results for input(s): AMMONIA in the last 168 hours.  CBC:  Recent Labs Lab 12/03/14 0419 12/04/14 0410 12/05/14 0448 12/06/14 0409 12/07/14 0402  WBC 10.6 8.8 8.2 7.9 9.1  HGB 8.4* 8.1* 8.2* 8.6* 9.8*  HCT 25.1* 25.0* 25.1* 25.9* 30.2*  MCV 83.1 82.6 82.3 81.6 83.0  PLT 208 195 194 203 221    Cardiac Enzymes:  Recent Labs Lab 12/01/14 1132  TROPONINI 0.62*    BNP: Invalid input(s): POCBNP  CBG: No results for input(s): GLUCAP in the last 168 hours.  Microbiology: Results for orders placed or performed during the hospital encounter of 12/01/14  Culture, blood (routine x 2)     Status: None   Collection Time: 12/01/14 11:34 AM  Result Value Ref Range Status   Specimen Description BLOOD LEFT ASSIST CONTROL  Final   Special Requests BAA,8ML,ANA,AER  Final   Culture  Setup Time   Final    GRAM POSITIVE COCCI IN BOTH AEROBIC AND ANAEROBIC BOTTLES CRITICAL RESULT CALLED TO, READ BACK BY AND VERIFIED WITH: JANE PHLUFMM AT [facility] 12/02/14.PMH CONFIRMED BY RWW    Culture   Final    VIRIDANS STREPTOCOCCUS IN BOTH AEROBIC AND ANAEROBIC BOTTLES    Report  Status 12/04/2014 FINAL  Final   Organism ID, Bacteria VIRIDANS STREPTOCOCCUS  Final      Susceptibility   Viridans streptococcus - MIC*    ERYTHROMYCIN Value in next row Resistant      RESISTANT>=8    TETRACYCLINE Value in next row Resistant      RESISTANT>=16    VANCOMYCIN Value in next row Sensitive      SENSITIVE0.5    CEFTRIAXONE Value in next row Sensitive      SENSITIVE<=0.25    PENICILLIN Value in next row Sensitive      SENSITIVE0.12    LEVOFLOXACIN Value in next row Sensitive      SENSITIVE2    * VIRIDANS STREPTOCOCCUS  Culture, blood (routine x 2)     Status: None   Collection Time: 12/01/14 12:01 PM  Result Value Ref Range Status   Specimen Description BLOOD LEFT ARM  Final   Special Requests BOTTLES DRAWN AEROBIC AND ANAEROBIC 5CC  Final   Culture  Setup Time   Final    GRAM  POSITIVE COCCI IN BOTH AEROBIC AND ANAEROBIC BOTTLES CRITICAL RESULT CALLED TO, READ BACK BY AND VERIFIED WITH: JANE PHLUFMM AT [place] 12/02/14.PMH CONFIRMED BY RWW    Culture   Final    VIRIDANS STREPTOCOCCUS IN BOTH AEROBIC AND ANAEROBIC BOTTLES    Report Status 12/04/2014 FINAL  Final   Organism ID, Bacteria VIRIDANS STREPTOCOCCUS  Final      Susceptibility   Viridans streptococcus - MIC*    ERYTHROMYCIN Value in next row Resistant      RESISTANT>=8    TETRACYCLINE Value in next row Resistant      RESISTANT>=16    VANCOMYCIN Value in next row Sensitive      SENSITIVE0.5    PENICILLIN Value in next row Sensitive      SENSITIVE0.12    CEFTRIAXONE Value in next row Sensitive      SENSITIVE0.25    LEVOFLOXACIN Value in next row Sensitive      SENSITIVE2    * VIRIDANS STREPTOCOCCUS  MRSA PCR Screening     Status: Abnormal   Collection Time: 12/01/14  3:17 PM  Result Value Ref Range Status   MRSA by PCR POSITIVE (A) NEGATIVE Final    Comment:        The GeneXpert MRSA Assay (FDA approved for NASAL specimens only), is one component of a comprehensive MRSA colonization surveillance program. It is not intended to diagnose MRSA infection nor to guide or monitor treatment for MRSA infections. CRITICAL RESULT CALLED TO, READ BACK BY AND VERIFIED WITH: MADDIE HIMES AT [id number] ON 12/01/14 BY [facility]   Cath Tip Culture     Status: None   Collection Time: 12/03/14  4:45 PM  Result Value Ref Range Status   Specimen Description CATH TIP  Final   Special Requests NONE  Final   Culture NO GROWTH 3 DAYS  Final   Report Status 12/06/2014 FINAL  Final  C difficile quick scan w PCR reflex     Status: Abnormal   Collection Time: 12/04/14  2:00 PM  Result Value Ref Range Status   C Diff antigen POSITIVE (A) NEGATIVE Final   C Diff toxin NEGATIVE NEGATIVE Final   C Diff interpretation   Final    Negative for toxigenic C. difficile. Toxin gene and active toxin production not detected. May be a  nontoxigenic strain of C. difficile bacteria present, lacking the ability to produce toxin.  Clostridium Difficile by PCR  Status: None   Collection Time: 12/04/14  2:00 PM  Result Value Ref Range Status   Toxigenic C Difficile by pcr NEGATIVE NEGATIVE Final  Culture, blood (routine x 2)     Status: None (Preliminary result)   Collection Time: 12/04/14  8:20 PM  Result Value Ref Range Status   Specimen Description BLOOD LEFT ARM  Final   Special Requests BOTTLES DRAWN AEROBIC AND ANAEROBIC 4CC  Final   Culture NO GROWTH 3 DAYS  Final   Report Status PENDING  Incomplete    Coagulation Studies: No results for input(s): LABPROT, INR in the last 72 hours.  Urinalysis: No results for input(s): COLORURINE, LABSPEC, PHURINE, GLUCOSEU, HGBUR, BILIRUBINUR, KETONESUR, PROTEINUR, UROBILINOGEN, NITRITE, LEUKOCYTESUR in the last 72 hours.  Invalid input(s): APPERANCEUR    Imaging: No results found.   Medications:   . sodium chloride     . amiodarone  200 mg Oral BID  . amLODipine  5 mg Oral Daily  . calcium carbonate  1 tablet Oral BID PC  . Chlorhexidine Gluconate Cloth  6 each Topical Once  . cinacalcet  60 mg Oral Q breakfast  . docusate sodium  100 mg Oral BID  . epoetin (EPOGEN/PROCRIT) injection  10,000 Units Intravenous Q T,Th,Sa-HD  . ferrous sulfate  325 mg Oral Daily  . folic acid-vitamin b complex-vitamin c-selenium-zinc  1 tablet Oral Daily  . furosemide  80 mg Oral Daily  . insulin aspart  0-5 Units Subcutaneous QHS  . insulin aspart  0-9 Units Subcutaneous TID WC  . isosorbide mononitrate  30 mg Oral Daily  . metoprolol tartrate  12.5 mg Oral BID  . mometasone-formoterol  2 puff Inhalation BID  . pantoprazole  40 mg Oral Daily  . vancomycin  750 mg Intravenous Once per day on Tue Thu Sat   sodium chloride, sodium chloride, acetaminophen, albuterol, alteplase, alum & mag hydroxide-simeth, heparin, hydrALAZINE, lidocaine (PF), Lidocaine-Prilocaine (Bulk),  lidocaine-prilocaine, ondansetron (ZOFRAN) IV, oxyCODONE, pentafluoroprop-tetrafluoroeth, sodium chloride, traMADol  Assessment/ Plan:  59 y.o. 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  ESRD on HD TTHS s/p permcath removal. Her hemodialysis access worked well yesterday.  No acute indication for dialysis today.  Next dialysis tomorrow if still here.   2.  Anemia of CKD/anemia blood loss: continue epogen 10000 units iV with HD.  3.  SHPTH:  Discontinue Sensipar as PTH 25.  Continue calcium carbonate.   4.  Sepsis: Streptococcus viridans noted in the blood. Continue vancomycin.    5.  Left sided bacterial pneumonia:  This was noted on prior CT scan and current CXR. -Streptococcus viridans most likely etiology of this. It appears to be sensitive to vancomycin.       LOS: 6 Karey Suthers 11/18/201612:04 PM

## 2014-12-07 NOTE — Progress Notes (Addendum)
SATURATION QUALIFICATIONS: (This note is used to comply with regulatory documentation for home oxygen)  Patient Saturations on Room Air at Rest = 93%  Patient Saturations on Room Air while Ambulating = 81%, became very Short of breath with low sats after only 5 steps  Patient Saturations on Room Oxygen 2L while Ambulating =90% Please briefly explain why patient needs home oxygen: COPD

## 2014-12-07 NOTE — Care Management (Addendum)
Patient states her PCP fired her because she "missed too many appointments".  It was with [facility] [phone] . Apparently a [facility] referral has been arranged but still no PCP according to patient  [phone]. She has not been seen by [place] since 2015 and it was only for pulmonary needs. Irving Copas will not establish for PCP services but pulmonary will see her again on 12/28/14 at 10:30AM but Dr. Humphrey Rolls will not agree to home health following. She would like to use [facility] home health and I will send referral but historically they have not accepted our patient due to case load from [facility]. She states she has O2 at home PRN/QHS through [facility]. She will need continuous O2 with new O2 sat assessment. She request "small tanks". I checked with Will with [facility] and patient does have O2 through them every hour of sleep so new order will be needed. Her son would arrange transportation home after he gets off work. She uses [facility] to dialysis. She requests that home health see her after 10AM M, W, F.

## 2014-12-09 LAB — CULTURE, BLOOD (ROUTINE X 2): CULTURE: NO GROWTH

## 2014-12-10 NOTE — Discharge Summary (Signed)
[facility] [place] NAME: Kelsey Peterson    MR#:  [id number]  DATE OF BIRTH:  1955/09/21  DATE OF ADMISSION:  12/01/2014 ADMITTING PHYSICIAN: Demetrios Loll, MD  DATE OF DISCHARGE: 12/07/2014  4:15 PM  PRIMARY CARE PHYSICIAN: Lavera Guise, MD    ADMISSION DIAGNOSIS:  UTI (lower urinary tract infection) [N39.0] Left lower lobe pneumonia [J18.9] Sepsis, due to unspecified organism ([place]) [A41.9]  DISCHARGE DIAGNOSIS:  Principal Problem:   Pneumonia Active Problems:   Sepsis ([place])   UTI (lower urinary tract infection)   Lactic acid acidosis   Cholelithiasis Strep Viridans Bacteremia SECONDARY DIAGNOSIS:   Past Medical History  Diagnosis Date  . COPD (chronic obstructive pulmonary disease) ([place])   . ESRD on hemodialysis ([place])   . DM2 (diabetes mellitus, type 2) ([place])   . Hepatitis C   . Migraine with aura   . HOH (hard of hearing)   . Mitral regurgitation     a. echo 06/2014: EF 60%, no WMA, GR1DD, mild AI, calcified mitral annulus, mod MR, no atrial septal defect or patent foramen ovale   . Cognitive impairment   . CAD (coronary artery disease)   . HTN (hypertension)    HOSPITAL COURSE:  59 y.o. female with a known history of hypertension, diabetes, CAD, ESRD and COPD was sent from dialysis center due to shortness of breath and fever (103) to ED and was admitted for sepsis due to possible Pneumonia. Please see Dr Lianne Moris dictated H & P for further details.  Chest x-ray show left sided consolidation. WBC 24,000. She was treated with vancomycin and Zosyn earlier in the course of Hospitalization but her [place] + for viridans strep so ID c/s was obtained and she Underwent removal of Permacath 11/14. FU BCX 11/15 remained neg. She has defervesced, wbc down from 23 -> 8.  TTE negative for vegetation. So, ID recommended 4 weeks vanco at HD from 11/15 - with a stop date 12/13. Following completion of 4 weeks treatment - from date of  first neg bcx - would repeat bcx as subsequent HD sessions x 2 to ensure clearance. No need for TEE since will treat 4 weeks either way.  Patient was getting HD while in the Hospital per Nephrology.  She was evaluated by Surgeon while in the Hospital for imaging showing gallbladder with gallstones and CBD upto 5 mm with hyperbilirubinemia - choledocholithiasis.They recommended obtaining GI consult.  GI recommended to check LFT and if elevated, then order MRCP to evaluate CBD. If MRCP abnormal, will then consider ERCP later once pt is off plavix for 5 days and Pt's respiratory status more stable.  But as patient was asymptomatic and LFTs didn't stay high, further work up wasn't performed.  She was feeling much better and was D/C home in stable condition. She was agreeable with D/C plans. She may need further work up of choledocholithiasis as an outpt.  DISCHARGE CONDITIONS:   stable  CONSULTS OBTAINED:  Treatment Team:  Anthonette Legato, MD Adrian Prows, MD DRUG ALLERGIES:   Allergies  Allergen Reactions  . Contrast Media [Iodinated Diagnostic Agents] Other (See Comments)    Unknown reaction  . Morphine And Related Hives  . Other Rash    Blood Pressure Pill (Pt doesn't remember name)    DISCHARGE MEDICATIONS:   Discharge Medication List as of 12/07/2014  3:26 PM    START taking these medications   Details  Vancomycin (VANCOCIN) 750 MG/150ML SOLN Inject 150  mLs (750 mg total) into the vein every dialysis. For 4 weeks, to be stopped on 01/01/15, Starting 12/07/2014, Until Discontinued, Normal      CONTINUE these medications which have NOT CHANGED   Details  albuterol (PROVENTIL HFA;VENTOLIN HFA) 108 (90 BASE) MCG/ACT inhaler Inhale 2 puffs into the lungs every 6 (six) hours as needed for wheezing or shortness of breath., Starting 09/06/2014, Until Discontinued, Normal    albuterol (PROVENTIL) (2.5 MG/3ML) 0.083% nebulizer solution Take 2.5 mg by nebulization every 4 (four)  hours., Until Discontinued, Historical Med    amLODipine (NORVASC) 5 MG tablet Take 5 mg by mouth daily., Until Discontinued, Historical Med    calcium carbonate (TUMS - DOSED IN MG ELEMENTAL CALCIUM) 500 MG chewable tablet Chew 1 tablet by mouth 2 (two) times daily after a meal. Take 2 hours after meal., Until Discontinued, Historical Med    clopidogrel (PLAVIX) 75 MG tablet Take 75 mg by mouth daily., Starting 08/31/2014, Until Discontinued, Historical Med    esomeprazole (NEXIUM) 40 MG capsule Take 40 mg by mouth daily. , Until Discontinued, Historical Med    ferrous sulfate 325 (65 FE) MG tablet Take 325 mg by mouth daily., Until Discontinued, Historical Med    folic acid-vitamin b complex-vitamin c-selenium-zinc (DIALYVITE) 3 MG TABS tablet Take 1 tablet by mouth daily., Until Discontinued, Historical Med    furosemide (LASIX) 80 MG tablet Take 80 mg by mouth daily. Patient takes on non dialysis days., Until Discontinued, Historical Med    isosorbide mononitrate (IMDUR) 30 MG 24 hr tablet Take 30 mg by mouth daily., Until Discontinued, Historical Med    Lidocaine-Prilocaine, Bulk, [id number] % CREA 1 application by Other route as needed (for tx). Apply to site 20-45 minutes before tx., Until Discontinued, Historical Med    sevelamer carbonate (RENVELA) 800 MG tablet Take 800 mg by mouth 3 (three) times daily with meals., Until Discontinued, Historical Med    diazepam (VALIUM) 2 MG tablet Take 1 tablet (2 mg total) by mouth every 8 (eight) hours as needed for muscle spasms., Starting 11/02/2014, Until Sat 11/02/15, Print    oxyCODONE (OXY IR/ROXICODONE) 5 MG immediate release tablet Take 1 tablet (5 mg total) by mouth every 4 (four) hours as needed for severe pain., Starting 11/02/2014, Until Discontinued, Print    traMADol (ULTRAM) 50 MG tablet Take 50 mg by mouth 2 (two) times daily as needed for moderate pain., Until Discontinued, Historical Med      STOP taking these medications      predniSONE (DELTASONE) 10 MG tablet      ADVAIR DISKUS 250-50 MCG/DOSE AEPB      amiodarone (PACERONE) 200 MG tablet      cinacalcet (SENSIPAR) 60 MG tablet      metoprolol tartrate (LOPRESSOR) 25 MG tablet        DISCHARGE INSTRUCTIONS:    DIET:  Renal diet  DISCHARGE CONDITION:  Good  ACTIVITY:  Activity as tolerated  OXYGEN:  Home Oxygen: No.   Oxygen Delivery: room air  DISCHARGE LOCATION:  home   If you experience worsening of your admission symptoms, develop shortness of breath, life threatening emergency, suicidal or homicidal thoughts you must seek medical attention immediately by calling 911 or calling your MD immediately  if symptoms less severe.  You Must read complete instructions/literature along with all the possible adverse reactions/side effects for all the Medicines you take and that have been prescribed to you. Take any new Medicines after you have completely understood and accpet  all the possible adverse reactions/side effects.   Please note  You were cared for by a hospitalist during your hospital stay. If you have any questions about your discharge medications or the care you received while you were in the hospital after you are discharged, you can call the unit and asked to speak with the hospitalist on call if the hospitalist that took care of you is not available. Once you are discharged, your primary care physician will handle any further medical issues. Please note that NO REFILLS for any discharge medications will be authorized once you are discharged, as it is imperative that you return to your primary care physician (or establish a relationship with a primary care physician if you do not have one) for your aftercare needs so that they can reassess your need for medications and monitor your lab values.    On the day of Discharge: VITAL SIGNS:  Blood pressure 162/78, pulse 80, temperature 98.3 F (36.8 C), temperature source Oral, resp. rate  17, height 5' 2.5" (1.588 m), weight 86.2 kg (190 lb 0.6 oz), SpO2 92 %. PHYSICAL EXAMINATION:  GENERAL:  59 y.o.-year-old patient lying in the bed with no acute distress.  EYES: Pupils equal, round, reactive to light and accommodation. No scleral icterus. Extraocular muscles intact.  HEENT: Head atraumatic, normocephalic. Oropharynx and nasopharynx clear.  NECK:  Supple, no jugular venous distention. No thyroid enlargement, no tenderness.  LUNGS: Normal breath sounds bilaterally, no wheezing, rales,rhonchi or crepitation. No use of accessory muscles of respiration.  CARDIOVASCULAR: S1, S2 normal. No murmurs, rubs, or gallops.  ABDOMEN: Soft, non-tender, non-distended. Bowel sounds present. No organomegaly or mass.  EXTREMITIES: No pedal edema, cyanosis, or clubbing.  NEUROLOGIC: Cranial nerves II through XII are intact. Muscle strength 5/5 in all extremities. Sensation intact. Gait not checked.  PSYCHIATRIC: The patient is alert and oriented x 3.  SKIN: No obvious rash, lesion, or ulcer.  DATA REVIEW:   CBC  Recent Labs Lab 12/07/14 0402  WBC 9.1  HGB 9.8*  HCT 30.2*  PLT 221    Chemistries   Recent Labs Lab 12/04/14 0410  12/07/14 0402  NA 128*  < > 131*  K 3.2*  < > 3.3*  CL 91*  < > 91*  CO2 25  < > 29  GLUCOSE 90  < > 72  BUN 20  < > 14  CREATININE 5.31*  < > 4.36*  CALCIUM 8.2*  < > 8.4*  AST 23  --   --   ALT 18  --   --   ALKPHOS 55  --   --   BILITOT 1.4*  --   --   < > = values in this interval not displayed.  Cardiac Enzymes No results for input(s): TROPONINI in the last 168 hours.  Microbiology Results  Results for orders placed or performed during the hospital encounter of 12/01/14  Culture, blood (routine x 2)     Status: None   Collection Time: 12/01/14 11:34 AM  Result Value Ref Range Status   Specimen Description BLOOD LEFT ASSIST CONTROL  Final   Special Requests BAA,8ML,ANA,AER  Final   Culture  Setup Time   Final    GRAM POSITIVE COCCI IN  BOTH AEROBIC AND ANAEROBIC BOTTLES CRITICAL RESULT CALLED TO, READ BACK BY AND VERIFIED WITH: JANE PHLUFMM AT [place] 12/02/14.PMH CONFIRMED BY RWW    Culture   Final    VIRIDANS STREPTOCOCCUS IN BOTH AEROBIC AND ANAEROBIC BOTTLES  Report Status 12/04/2014 FINAL  Final   Organism ID, Bacteria VIRIDANS STREPTOCOCCUS  Final      Susceptibility   Viridans streptococcus - MIC*    ERYTHROMYCIN Value in next row Resistant      RESISTANT>=8    TETRACYCLINE Value in next row Resistant      RESISTANT>=16    VANCOMYCIN Value in next row Sensitive      SENSITIVE0.5    CEFTRIAXONE Value in next row Sensitive      SENSITIVE<=0.25    PENICILLIN Value in next row Sensitive      SENSITIVE0.12    LEVOFLOXACIN Value in next row Sensitive      SENSITIVE2    * VIRIDANS STREPTOCOCCUS  Culture, blood (routine x 2)     Status: None   Collection Time: 12/01/14 12:01 PM  Result Value Ref Range Status   Specimen Description BLOOD LEFT ARM  Final   Special Requests BOTTLES DRAWN AEROBIC AND ANAEROBIC 5CC  Final   Culture  Setup Time   Final    GRAM POSITIVE COCCI IN BOTH AEROBIC AND ANAEROBIC BOTTLES CRITICAL RESULT CALLED TO, READ BACK BY AND VERIFIED WITH: JANE PHLUFMM AT [place] 12/02/14.PMH CONFIRMED BY RWW    Culture   Final    VIRIDANS STREPTOCOCCUS IN BOTH AEROBIC AND ANAEROBIC BOTTLES    Report Status 12/04/2014 FINAL  Final   Organism ID, Bacteria VIRIDANS STREPTOCOCCUS  Final      Susceptibility   Viridans streptococcus - MIC*    ERYTHROMYCIN Value in next row Resistant      RESISTANT>=8    TETRACYCLINE Value in next row Resistant      RESISTANT>=16    VANCOMYCIN Value in next row Sensitive      SENSITIVE0.5    PENICILLIN Value in next row Sensitive      SENSITIVE0.12    CEFTRIAXONE Value in next row Sensitive      SENSITIVE0.25    LEVOFLOXACIN Value in next row Sensitive      SENSITIVE2    * VIRIDANS STREPTOCOCCUS  MRSA PCR Screening     Status: Abnormal   Collection Time:  12/01/14  3:17 PM  Result Value Ref Range Status   MRSA by PCR POSITIVE (A) NEGATIVE Final    Comment:        The GeneXpert MRSA Assay (FDA approved for NASAL specimens only), is one component of a comprehensive MRSA colonization surveillance program. It is not intended to diagnose MRSA infection nor to guide or monitor treatment for MRSA infections. CRITICAL RESULT CALLED TO, READ BACK BY AND VERIFIED WITH: MADDIE HIMES AT [id number] ON 12/01/14 BY [facility]   Cath Tip Culture     Status: None   Collection Time: 12/03/14  4:45 PM  Result Value Ref Range Status   Specimen Description CATH TIP  Final   Special Requests NONE  Final   Culture NO GROWTH 3 DAYS  Final   Report Status 12/06/2014 FINAL  Final  C difficile quick scan w PCR reflex     Status: Abnormal   Collection Time: 12/04/14  2:00 PM  Result Value Ref Range Status   C Diff antigen POSITIVE (A) NEGATIVE Final   C Diff toxin NEGATIVE NEGATIVE Final   C Diff interpretation   Final    Negative for toxigenic C. difficile. Toxin gene and active toxin production not detected. May be a nontoxigenic strain of C. difficile bacteria present, lacking the ability to produce toxin.  Clostridium Difficile by PCR  Status: None   Collection Time: 12/04/14  2:00 PM  Result Value Ref Range Status   Toxigenic C Difficile by pcr NEGATIVE NEGATIVE Final  Culture, blood (routine x 2)     Status: None   Collection Time: 12/04/14  8:20 PM  Result Value Ref Range Status   Specimen Description BLOOD LEFT ARM  Final   Special Requests BOTTLES DRAWN AEROBIC AND ANAEROBIC 4CC  Final   Culture NO GROWTH 5 DAYS  Final   Report Status 12/09/2014 FINAL  Final    RADIOLOGY:  No results found.   Management plans discussed with the patient, family and they are in agreement.  CODE STATUS: Full Code  TOTAL TIME TAKING CARE OF THIS PATIENT: 55 minutes.    [facility], Yanelis Osika M.D on 12/10/2014 at 7:40 AM  Between 7am to 6pm - Pager - [phone]  After  6pm go to [email or web address] - password [email or web address] [facility]  Office  [phone]  CC: Primary care physician; Lavera Guise, MD [place], Lupita Dawn, MD Adrian Prows, MD Algernon Huxley, MD Marlyce Huge, MD

## 2014-12-12 ENCOUNTER — Other Ambulatory Visit: Payer: Self-pay | Admitting: *Deleted

## 2014-12-12 NOTE — Patient Outreach (Signed)
Follow up phone call [facility] consent on pt):  Spoke with Beth at Dr. Laurelyn Sickle office as 2 attempts were  made/voice messages left with MD's nurse.   Discussed with Eustaquio Maize (does referrals) received a [facility] referral on pt, HIPPA verified, to f/u with pt  post hospitalization.  Informed Beth  that pt informed inpatient case manager does not have a Primary Care MD, saw Dr. Humphrey Rolls in the past,would not see her  because of missed appointments.  Beth reports pt is  not seen for Primary (last 2012), refused for many reasons, is seen for Pulmonary.      As discussed with Bary Castilla [facility] Assistant Director) yesterday, if pt does not have a [facility] Primary Care MD, will not be able to provide [facility] RN CM services (transition of care). Plan to inform [facility] care management assistant) to close case, pt not eligible for [facility] program.

## 2014-12-17 ENCOUNTER — Other Ambulatory Visit: Payer: Self-pay | Admitting: *Deleted

## 2015-01-07 ENCOUNTER — Other Ambulatory Visit: Payer: Self-pay | Admitting: Internal Medicine

## 2015-01-07 ENCOUNTER — Ambulatory Visit
Admission: RE | Admit: 2015-01-07 | Discharge: 2015-01-07 | Disposition: A | Discharge status: Home / Self Care | Payer: Medicare Other | Source: Ambulatory Visit | Attending: Internal Medicine | Admitting: Internal Medicine

## 2015-01-07 DIAGNOSIS — J9811 Atelectasis: Secondary | ICD-10-CM | POA: Insufficient documentation

## 2015-01-07 DIAGNOSIS — R05 Cough: Secondary | ICD-10-CM

## 2015-01-07 DIAGNOSIS — I517 Cardiomegaly: Secondary | ICD-10-CM | POA: Diagnosis not present

## 2015-01-07 DIAGNOSIS — J90 Pleural effusion, not elsewhere classified: Secondary | ICD-10-CM | POA: Insufficient documentation

## 2015-01-07 DIAGNOSIS — R0602 Shortness of breath: Secondary | ICD-10-CM | POA: Insufficient documentation

## 2015-01-07 DIAGNOSIS — R059 Cough, unspecified: Secondary | ICD-10-CM

## 2015-01-11 ENCOUNTER — Other Ambulatory Visit: Payer: Self-pay | Admitting: Nephrology

## 2015-01-11 ENCOUNTER — Observation Stay
Admission: AD | Admit: 2015-01-11 | Discharge: 2015-01-11 | Disposition: A | Discharge status: Home / Self Care | Payer: Medicare Other | Source: Ambulatory Visit | Attending: Nephrology | Admitting: Nephrology

## 2015-01-11 DIAGNOSIS — Z992 Dependence on renal dialysis: Secondary | ICD-10-CM | POA: Insufficient documentation

## 2015-01-11 DIAGNOSIS — N186 End stage renal disease: Secondary | ICD-10-CM | POA: Insufficient documentation

## 2015-01-11 DIAGNOSIS — D631 Anemia in chronic kidney disease: Secondary | ICD-10-CM | POA: Insufficient documentation

## 2015-01-11 DIAGNOSIS — J810 Acute pulmonary edema: Secondary | ICD-10-CM | POA: Diagnosis present

## 2015-01-11 DIAGNOSIS — I120 Hypertensive chronic kidney disease with stage 5 chronic kidney disease or end stage renal disease: Secondary | ICD-10-CM | POA: Diagnosis not present

## 2015-01-11 LAB — HEMOGLOBIN: Hemoglobin: 6.9 g/dL — ABNORMAL LOW (ref 12.0–16.0)

## 2015-01-11 LAB — PREPARE RBC (CROSSMATCH)

## 2015-01-11 MED ORDER — CLONIDINE HCL 0.1 MG PO TABS
0.1000 mg | ORAL_TABLET | Freq: Once | ORAL | Status: DC
  Filled 2015-01-11: qty 1

## 2015-01-11 MED ORDER — SODIUM CHLORIDE 0.9 % IV SOLN
INTRAVENOUS | Status: DC
  Administered 2015-01-11: 10:00:00 via INTRAVENOUS

## 2015-01-11 MED ORDER — ACETAMINOPHEN 325 MG PO TABS
650.0000 mg | ORAL_TABLET | Freq: Once | ORAL | Status: AC
  Administered 2015-01-11: 650 mg via ORAL

## 2015-01-11 MED ORDER — DIPHENHYDRAMINE HCL 25 MG PO TABS
12.5000 mg | ORAL_TABLET | Freq: Once | ORAL | Status: AC
  Administered 2015-01-11: 12.5 mg via ORAL
  Filled 2015-01-11: qty 0.5

## 2015-01-11 NOTE — Progress Notes (Signed)
Pt awake and Ox3 upon transfer to dialysis VSS no c/o SOB at this time. Left hand IV infusing well  Blood transfusion infusing well.

## 2015-01-11 NOTE — Progress Notes (Signed)
Patient has completed her dialysis 3000 cc of fluid was removed She states her breathing is better She was given clonidine 0.1 mg by mouth 1 prior to completing her dialysis treatment Her last blood pressure is 172/97  Exam: Gen.: Sitting in a chair, no acute distress Lungs: Mild basilar crackles bilaterally but significantly improved from earlier exam Cardiovascular: Regular rhythm Extremities 2+ peripheral edema Neuro: Alert and oriented  Plan: Patient will resume her normal dialysis as outpatient tomorrow She can go home today

## 2015-01-11 NOTE — Progress Notes (Signed)
  Subjective:  Patient is known to our practice from outpatient She dialyzes at [facility] dialysis unit Tuesday, Thursday, Saturday She missed her dialysis treatment yesterday She presents for outpatient blood transfusion While getting blood transfusion, patient got short of breath and her blood pressure was noted to be critically high Clinically, she was diagnosed to be in pulmonary edema   Objective:  Vital signs in last 24 hours:  Temp:  [97.7 F (36.5 C)-98.3 F (36.8 C)] 97.7 F (36.5 C) (12/23 1146) Pulse Rate:  [78-97] 94 (12/23 1146) Resp:  [22-28] 26 (12/23 1146) BP: (167-200)/(88-100) 200/100 mmHg (12/23 1146) SpO2:  [90 %-100 %] 100 % (12/23 1146)  Weight change:  There were no vitals filed for this visit.  Intake/Output:    Intake/Output Summary (Last 24 hours) at 01/11/15 1404 Last data filed at 01/11/15 0945  Gross per 24 hour  Intake      0 ml  Output      0 ml  Net      0 ml     Physical Exam: General:  alert, no distress   HEENT  anicteric, moist oral mucous membranes   Neck  supple   Pulm/lungs  diffuse bilateral pulmonary crackles   CVS/Heart  tachycardic, no rub   Abdomen:   soft, nontender, nondistended   Extremities:  no peripheral edema   Neurologic:  alert, oriented, decreased hearing but communicates with writing   Skin:  no acute rashes   Access:  AV fistula, aneurysmal        Basic Metabolic Panel:  No results for input(s): NA, K, CL, CO2, GLUCOSE, BUN, CREATININE, CALCIUM, MG, PHOS in the last 168 hours.   CBC:  Recent Labs Lab 01/11/15 0806  HGB 6.9*      Microbiology:  No results found for this or any previous visit (from the past 720 hour(s)).  Coagulation Studies: No results for input(s): LABPROT, INR in the last 72 hours.  Urinalysis: No results for input(s): COLORURINE, LABSPEC, PHURINE, GLUCOSEU, HGBUR, BILIRUBINUR, KETONESUR, PROTEINUR, UROBILINOGEN, NITRITE, LEUKOCYTESUR in the last 72 hours.  Invalid  input(s): APPERANCEUR    Imaging: No results found.   Medications:   . sodium chloride 10 mL/hr at 01/11/15 0946       Assessment/ Plan:  59 y.o. female with end-stage renal disease, hypertension, anemia  1. Acute pulmonary edema in the setting of end-stage renal disease. Patient missed her dialysis treatment yesterday Patient will be placed under observation admission Patient will be emergently dialyzed She may be able to be discharged home after dialysis if clinically stable  2. Anemia Patient will complete her blood transfusion  3. Hypertension Blood pressure is expected to improve once extra volume is removed with dialysis     LOS:  Adain Geurin 12/23/20162:04 PM

## 2015-01-11 NOTE — Progress Notes (Signed)
Dr. Candiss Norse in with pt.  Talked with pt about the importance of being dialyzed. Pt missed dialysis yesterday, pt stated that she fell at home and didn't feel like going to dialysis.

## 2015-01-11 NOTE — Care Management (Cosign Needed)
Spoke with Dr. Candiss Norse and patient will discharge after dialysis today. BP has reached patient normal per Dr. Candiss Norse. Telemetry bed released to anther patient by admitting per Dr. Candiss Norse.

## 2015-01-11 NOTE — Progress Notes (Signed)
Pt transferred to dialysis unit via wheelchair accompanied by RN Pt will be dialyzed today and transferred back to [place] for discharge.

## 2015-01-13 LAB — TYPE AND SCREEN
ABO/RH(D): O NEG
Antibody Screen: NEGATIVE
Unit division: 0

## 2015-01-24 ENCOUNTER — Other Ambulatory Visit: Payer: Self-pay | Admitting: Physician Assistant

## 2015-01-24 DIAGNOSIS — J90 Pleural effusion, not elsewhere classified: Secondary | ICD-10-CM

## 2015-01-30 ENCOUNTER — Ambulatory Visit: Admission: RE | Admit: 2015-01-30 | Payer: Medicare Other | Source: Ambulatory Visit

## 2015-02-04 ENCOUNTER — Other Ambulatory Visit: Payer: Self-pay | Admitting: Internal Medicine

## 2015-02-04 ENCOUNTER — Ambulatory Visit
Admission: RE | Admit: 2015-02-04 | Discharge: 2015-02-04 | Disposition: A | Discharge status: Home / Self Care | Payer: Medicare Other | Source: Ambulatory Visit | Attending: Internal Medicine | Admitting: Internal Medicine

## 2015-02-04 DIAGNOSIS — J90 Pleural effusion, not elsewhere classified: Secondary | ICD-10-CM | POA: Insufficient documentation

## 2015-02-04 DIAGNOSIS — I509 Heart failure, unspecified: Secondary | ICD-10-CM

## 2015-02-04 DIAGNOSIS — I132 Hypertensive heart and chronic kidney disease with heart failure and with stage 5 chronic kidney disease, or end stage renal disease: Secondary | ICD-10-CM | POA: Diagnosis not present

## 2015-02-05 ENCOUNTER — Emergency Department: Payer: Medicare Other

## 2015-02-05 ENCOUNTER — Encounter: Payer: Self-pay | Admitting: *Deleted

## 2015-02-05 ENCOUNTER — Inpatient Hospital Stay: Payer: Medicare Other

## 2015-02-05 ENCOUNTER — Inpatient Hospital Stay
Admission: EM | Admit: 2015-02-05 | Discharge: 2015-02-08 | DRG: 291 | Disposition: A | Discharge status: Home Health | Payer: Medicare Other | Attending: Internal Medicine | Admitting: Internal Medicine

## 2015-02-05 DIAGNOSIS — D631 Anemia in chronic kidney disease: Secondary | ICD-10-CM | POA: Diagnosis present

## 2015-02-05 DIAGNOSIS — J449 Chronic obstructive pulmonary disease, unspecified: Secondary | ICD-10-CM | POA: Diagnosis present

## 2015-02-05 DIAGNOSIS — Z91041 Radiographic dye allergy status: Secondary | ICD-10-CM | POA: Diagnosis not present

## 2015-02-05 DIAGNOSIS — J811 Chronic pulmonary edema: Secondary | ICD-10-CM | POA: Diagnosis present

## 2015-02-05 DIAGNOSIS — I2510 Atherosclerotic heart disease of native coronary artery without angina pectoris: Secondary | ICD-10-CM | POA: Diagnosis present

## 2015-02-05 DIAGNOSIS — Z888 Allergy status to other drugs, medicaments and biological substances status: Secondary | ICD-10-CM | POA: Diagnosis not present

## 2015-02-05 DIAGNOSIS — E1122 Type 2 diabetes mellitus with diabetic chronic kidney disease: Secondary | ICD-10-CM | POA: Diagnosis present

## 2015-02-05 DIAGNOSIS — Z7951 Long term (current) use of inhaled steroids: Secondary | ICD-10-CM

## 2015-02-05 DIAGNOSIS — I248 Other forms of acute ischemic heart disease: Secondary | ICD-10-CM | POA: Diagnosis present

## 2015-02-05 DIAGNOSIS — Z79899 Other long term (current) drug therapy: Secondary | ICD-10-CM

## 2015-02-05 DIAGNOSIS — J90 Pleural effusion, not elsewhere classified: Secondary | ICD-10-CM

## 2015-02-05 DIAGNOSIS — N186 End stage renal disease: Secondary | ICD-10-CM | POA: Diagnosis present

## 2015-02-05 DIAGNOSIS — J810 Acute pulmonary edema: Secondary | ICD-10-CM

## 2015-02-05 DIAGNOSIS — E875 Hyperkalemia: Secondary | ICD-10-CM | POA: Diagnosis present

## 2015-02-05 DIAGNOSIS — Z23 Encounter for immunization: Secondary | ICD-10-CM

## 2015-02-05 DIAGNOSIS — Z8661 Personal history of infections of the central nervous system: Secondary | ICD-10-CM | POA: Diagnosis not present

## 2015-02-05 DIAGNOSIS — H9190 Unspecified hearing loss, unspecified ear: Secondary | ICD-10-CM | POA: Diagnosis present

## 2015-02-05 DIAGNOSIS — B1920 Unspecified viral hepatitis C without hepatic coma: Secondary | ICD-10-CM | POA: Diagnosis present

## 2015-02-05 DIAGNOSIS — I132 Hypertensive heart and chronic kidney disease with heart failure and with stage 5 chronic kidney disease, or end stage renal disease: Secondary | ICD-10-CM | POA: Diagnosis present

## 2015-02-05 DIAGNOSIS — N2581 Secondary hyperparathyroidism of renal origin: Secondary | ICD-10-CM | POA: Diagnosis present

## 2015-02-05 DIAGNOSIS — Z885 Allergy status to narcotic agent status: Secondary | ICD-10-CM | POA: Diagnosis not present

## 2015-02-05 DIAGNOSIS — Z7902 Long term (current) use of antithrombotics/antiplatelets: Secondary | ICD-10-CM

## 2015-02-05 DIAGNOSIS — Z9889 Other specified postprocedural states: Secondary | ICD-10-CM

## 2015-02-05 DIAGNOSIS — Z992 Dependence on renal dialysis: Secondary | ICD-10-CM | POA: Diagnosis not present

## 2015-02-05 DIAGNOSIS — R0902 Hypoxemia: Secondary | ICD-10-CM | POA: Diagnosis present

## 2015-02-05 DIAGNOSIS — I5031 Acute diastolic (congestive) heart failure: Secondary | ICD-10-CM | POA: Diagnosis present

## 2015-02-05 DIAGNOSIS — I340 Nonrheumatic mitral (valve) insufficiency: Secondary | ICD-10-CM | POA: Diagnosis present

## 2015-02-05 LAB — BASIC METABOLIC PANEL
Anion gap: 18 — ABNORMAL HIGH (ref 5–15)
BUN: 44 mg/dL — AB (ref 6–20)
CALCIUM: 9.3 mg/dL (ref 8.9–10.3)
CO2: 20 mmol/L — AB (ref 22–32)
CREATININE: 6.21 mg/dL — AB (ref 0.44–1.00)
Chloride: 91 mmol/L — ABNORMAL LOW (ref 101–111)
GFR calc Af Amer: 8 mL/min — ABNORMAL LOW (ref >60)
GFR, EST NON AFRICAN AMERICAN: 7 mL/min — AB (ref >60)
GLUCOSE: 82 mg/dL (ref 65–99)
Potassium: 6.1 mmol/L — ABNORMAL HIGH (ref 3.5–5.1)
Sodium: 129 mmol/L — ABNORMAL LOW (ref 135–145)

## 2015-02-05 LAB — BLOOD GAS, ARTERIAL
ACID-BASE EXCESS: 1.8 mmol/L (ref 0.0–3.0)
Allens test (pass/fail): POSITIVE — AB
BICARBONATE: 25.7 meq/L (ref 21.0–28.0)
FIO2: 0.28
O2 Saturation: 93.9 %
PCO2 ART: 37 mmHg (ref 32.0–48.0)
PH ART: 7.45 (ref 7.350–7.450)
Patient temperature: 37
pO2, Arterial: 67 mmHg — ABNORMAL LOW (ref 83.0–108.0)

## 2015-02-05 LAB — CBC WITH DIFFERENTIAL/PLATELET
BASOS ABS: 0.1 10*3/uL (ref 0–0.1)
Basophils Relative: 2 %
EOS ABS: 0.2 10*3/uL (ref 0–0.7)
EOS PCT: 4 %
HCT: 25.6 % — ABNORMAL LOW (ref 35.0–47.0)
HEMOGLOBIN: 8.3 g/dL — AB (ref 12.0–16.0)
LYMPHS PCT: 16 %
Lymphs Abs: 0.9 10*3/uL — ABNORMAL LOW (ref 1.0–3.6)
MCH: 27.2 pg (ref 26.0–34.0)
MCHC: 32.5 g/dL (ref 32.0–36.0)
MCV: 83.5 fL (ref 80.0–100.0)
Monocytes Absolute: 0.5 10*3/uL (ref 0.2–0.9)
Monocytes Relative: 9 %
Neutro Abs: 3.9 10*3/uL (ref 1.4–6.5)
Neutrophils Relative %: 69 %
PLATELETS: 349 10*3/uL (ref 150–440)
RBC: 3.07 MIL/uL — AB (ref 3.80–5.20)
RDW: 18.2 % — ABNORMAL HIGH (ref 11.5–14.5)
WBC: 5.6 10*3/uL (ref 3.6–11.0)

## 2015-02-05 LAB — TROPONIN I: Troponin I: 0.07 ng/mL — ABNORMAL HIGH (ref <0.031)

## 2015-02-05 LAB — MRSA PCR SCREENING: MRSA by PCR: NEGATIVE

## 2015-02-05 LAB — PHOSPHORUS: Phosphorus: 8.9 mg/dL — ABNORMAL HIGH (ref 2.5–4.6)

## 2015-02-05 LAB — GLUCOSE, CAPILLARY: Glucose-Capillary: 73 mg/dL (ref 65–99)

## 2015-02-05 LAB — BRAIN NATRIURETIC PEPTIDE

## 2015-02-05 MED ORDER — ALBUTEROL SULFATE (2.5 MG/3ML) 0.083% IN NEBU
2.5000 mg | INHALATION_SOLUTION | RESPIRATORY_TRACT | Status: DC
  Administered 2015-02-05 – 2015-02-07 (×9): 2.5 mg via RESPIRATORY_TRACT
  Filled 2015-02-05 (×9): qty 3

## 2015-02-05 MED ORDER — AMIODARONE HCL 200 MG PO TABS
200.0000 mg | ORAL_TABLET | Freq: Two times a day (BID) | ORAL | Status: DC
  Administered 2015-02-05 – 2015-02-08 (×5): 200 mg via ORAL
  Filled 2015-02-05 (×5): qty 1

## 2015-02-05 MED ORDER — LIDOCAINE-PRILOCAINE 2.5-2.5 % EX CREA: 1.0000 "application " | TOPICAL_CREAM | CUTANEOUS | Status: DC | PRN

## 2015-02-05 MED ORDER — INFLUENZA VAC SPLIT QUAD 0.5 ML IM SUSY
0.5000 mL | PREFILLED_SYRINGE | Freq: Once | INTRAMUSCULAR | Status: AC
  Administered 2015-02-05: 0.5 mL via INTRAMUSCULAR
  Filled 2015-02-05 (×2): qty 0.5

## 2015-02-05 MED ORDER — FUROSEMIDE 40 MG PO TABS
80.0000 mg | ORAL_TABLET | Freq: Every day | ORAL | Status: DC
  Administered 2015-02-05 – 2015-02-08 (×3): 80 mg via ORAL
  Filled 2015-02-05: qty 2
  Filled 2015-02-05 (×2): qty 1

## 2015-02-05 MED ORDER — SODIUM CHLORIDE 0.9 % IV SOLN: 100.0000 mL | INTRAVENOUS | Status: DC | PRN

## 2015-02-05 MED ORDER — PANTOPRAZOLE SODIUM 40 MG PO TBEC
40.0000 mg | DELAYED_RELEASE_TABLET | Freq: Every day | ORAL | Status: DC
  Administered 2015-02-05 – 2015-02-08 (×4): 40 mg via ORAL
  Filled 2015-02-05 (×4): qty 1

## 2015-02-05 MED ORDER — ONDANSETRON HCL 4 MG PO TABS: 4.0000 mg | ORAL_TABLET | Freq: Three times a day (TID) | ORAL | Status: DC | PRN

## 2015-02-05 MED ORDER — ALBUTEROL SULFATE (2.5 MG/3ML) 0.083% IN NEBU: 2.5000 mg | INHALATION_SOLUTION | RESPIRATORY_TRACT | Status: DC | PRN

## 2015-02-05 MED ORDER — HYDRALAZINE HCL 20 MG/ML IJ SOLN
INTRAMUSCULAR | Status: AC
  Administered 2015-02-05: 10 mg via INTRAVENOUS
  Filled 2015-02-05: qty 1

## 2015-02-05 MED ORDER — METOPROLOL TARTRATE 25 MG PO TABS
12.5000 mg | ORAL_TABLET | Freq: Two times a day (BID) | ORAL | Status: DC
  Administered 2015-02-05 – 2015-02-08 (×4): 12.5 mg via ORAL
  Filled 2015-02-05 (×5): qty 1

## 2015-02-05 MED ORDER — HEPARIN SODIUM (PORCINE) 1000 UNIT/ML DIALYSIS
1000.0000 [IU] | INTRAMUSCULAR | Status: DC | PRN
  Filled 2015-02-05: qty 1

## 2015-02-05 MED ORDER — AMLODIPINE BESYLATE 5 MG PO TABS
5.0000 mg | ORAL_TABLET | Freq: Every day | ORAL | Status: DC
  Administered 2015-02-05 – 2015-02-08 (×3): 5 mg via ORAL
  Filled 2015-02-05 (×3): qty 1

## 2015-02-05 MED ORDER — ACETAMINOPHEN 325 MG PO TABS
650.0000 mg | ORAL_TABLET | Freq: Four times a day (QID) | ORAL | Status: DC | PRN
  Administered 2015-02-05: 650 mg via ORAL
  Filled 2015-02-05 (×2): qty 2

## 2015-02-05 MED ORDER — RENA-VITE PO TABS
1.0000 | ORAL_TABLET | Freq: Every day | ORAL | Status: DC
  Administered 2015-02-05 – 2015-02-07 (×3): 1 via ORAL
  Filled 2015-02-05 (×3): qty 1

## 2015-02-05 MED ORDER — HYDRALAZINE HCL 20 MG/ML IJ SOLN
10.0000 mg | Freq: Four times a day (QID) | INTRAMUSCULAR | Status: DC | PRN
  Administered 2015-02-05: 10 mg via INTRAVENOUS

## 2015-02-05 MED ORDER — HYDRALAZINE HCL 20 MG/ML IJ SOLN: 10.0000 mg | Freq: Four times a day (QID) | INTRAMUSCULAR | Status: DC | PRN

## 2015-02-05 MED ORDER — MOMETASONE FURO-FORMOTEROL FUM 100-5 MCG/ACT IN AERO
2.0000 | INHALATION_SPRAY | Freq: Two times a day (BID) | RESPIRATORY_TRACT | Status: DC
  Administered 2015-02-05 – 2015-02-08 (×6): 2 via RESPIRATORY_TRACT
  Filled 2015-02-05: qty 8.8

## 2015-02-05 MED ORDER — CALCIUM CARBONATE ANTACID 500 MG PO CHEW
1.0000 | CHEWABLE_TABLET | Freq: Two times a day (BID) | ORAL | Status: DC
  Administered 2015-02-07 – 2015-02-08 (×2): 200 mg via ORAL
  Filled 2015-02-05 (×4): qty 1

## 2015-02-05 MED ORDER — INSULIN ASPART 100 UNIT/ML IV SOLN
15.0000 [IU] | Freq: Once | INTRAVENOUS | Status: AC
  Administered 2015-02-05: 15 [IU] via INTRAVENOUS
  Filled 2015-02-05: qty 0.15

## 2015-02-05 MED ORDER — LIDOCAINE HCL (PF) 1 % IJ SOLN
5.0000 mL | INTRAMUSCULAR | Status: DC | PRN
  Filled 2015-02-05: qty 5

## 2015-02-05 MED ORDER — ISOSORBIDE MONONITRATE ER 30 MG PO TB24
30.0000 mg | ORAL_TABLET | Freq: Every day | ORAL | Status: DC
  Administered 2015-02-05 – 2015-02-07 (×3): 30 mg via ORAL
  Filled 2015-02-05 (×5): qty 1

## 2015-02-05 MED ORDER — HYDRALAZINE HCL 25 MG PO TABS
25.0000 mg | ORAL_TABLET | Freq: Three times a day (TID) | ORAL | Status: DC
  Administered 2015-02-05 – 2015-02-08 (×6): 25 mg via ORAL
  Filled 2015-02-05 (×12): qty 1

## 2015-02-05 MED ORDER — CINACALCET HCL 30 MG PO TABS
60.0000 mg | ORAL_TABLET | Freq: Every day | ORAL | Status: DC
  Administered 2015-02-06 – 2015-02-08 (×2): 60 mg via ORAL
  Filled 2015-02-05 (×3): qty 2

## 2015-02-05 MED ORDER — ONDANSETRON HCL 4 MG PO TABS: 4.0000 mg | ORAL_TABLET | Freq: Four times a day (QID) | ORAL | Status: DC | PRN

## 2015-02-05 MED ORDER — SENNOSIDES-DOCUSATE SODIUM 8.6-50 MG PO TABS: 1.0000 | ORAL_TABLET | Freq: Every evening | ORAL | Status: DC | PRN

## 2015-02-05 MED ORDER — CALCIUM ACETATE (PHOS BINDER) 667 MG PO CAPS
1334.0000 mg | ORAL_CAPSULE | Freq: Every day | ORAL | Status: DC
  Administered 2015-02-05 – 2015-02-08 (×4): 1334 mg via ORAL
  Filled 2015-02-05 (×4): qty 2

## 2015-02-05 MED ORDER — DIALYVITE 3000 3 MG PO TABS
1.0000 | ORAL_TABLET | Freq: Every day | ORAL | Status: DC
Start: 2015-02-05 — End: 2015-02-05

## 2015-02-05 MED ORDER — ACETAMINOPHEN 650 MG RE SUPP
650.0000 mg | Freq: Four times a day (QID) | RECTAL | Status: DC | PRN
Start: 2015-02-05 — End: 2015-02-08

## 2015-02-05 MED ORDER — HEPARIN SODIUM (PORCINE) 5000 UNIT/ML IJ SOLN
5000.0000 [IU] | Freq: Three times a day (TID) | INTRAMUSCULAR | Status: DC
  Administered 2015-02-05 – 2015-02-07 (×4): 5000 [IU] via SUBCUTANEOUS
  Filled 2015-02-05 (×4): qty 1

## 2015-02-05 MED ORDER — IPRATROPIUM-ALBUTEROL 0.5-2.5 (3) MG/3ML IN SOLN: 3.0000 mL | Freq: Once | RESPIRATORY_TRACT | Status: DC

## 2015-02-05 MED ORDER — FUROSEMIDE 10 MG/ML IJ SOLN
60.0000 mg | Freq: Once | INTRAMUSCULAR | Status: AC
  Administered 2015-02-05: 60 mg via INTRAVENOUS
  Filled 2015-02-05: qty 8

## 2015-02-05 MED ORDER — IPRATROPIUM-ALBUTEROL 0.5-2.5 (3) MG/3ML IN SOLN
RESPIRATORY_TRACT | Status: AC
  Filled 2015-02-05: qty 6

## 2015-02-05 MED ORDER — DEXTROSE 50 % IV SOLN
25.0000 mL | Freq: Once | INTRAVENOUS | Status: AC
  Administered 2015-02-05: 25 mL via INTRAVENOUS
  Filled 2015-02-05: qty 50

## 2015-02-05 MED ORDER — ONDANSETRON HCL 4 MG/2ML IJ SOLN: 4.0000 mg | Freq: Four times a day (QID) | INTRAMUSCULAR | Status: DC | PRN

## 2015-02-05 MED ORDER — TIOTROPIUM BROMIDE MONOHYDRATE 18 MCG IN CAPS
1.0000 | ORAL_CAPSULE | Freq: Every day | RESPIRATORY_TRACT | Status: DC
  Administered 2015-02-05 – 2015-02-08 (×4): 18 ug via RESPIRATORY_TRACT
  Filled 2015-02-05: qty 5

## 2015-02-05 MED ORDER — IPRATROPIUM-ALBUTEROL 0.5-2.5 (3) MG/3ML IN SOLN: 6.0000 mL | Freq: Once | RESPIRATORY_TRACT | Status: AC

## 2015-02-05 MED ORDER — SEVELAMER CARBONATE 800 MG PO TABS
800.0000 mg | ORAL_TABLET | Freq: Three times a day (TID) | ORAL | Status: DC
  Filled 2015-02-05: qty 1

## 2015-02-05 MED ORDER — CLOPIDOGREL BISULFATE 75 MG PO TABS
75.0000 mg | ORAL_TABLET | Freq: Every day | ORAL | Status: DC
  Administered 2015-02-05 – 2015-02-08 (×4): 75 mg via ORAL
  Filled 2015-02-05 (×4): qty 1

## 2015-02-05 MED ORDER — PENTAFLUOROPROP-TETRAFLUOROETH EX AERO: 1.0000 "application " | INHALATION_SPRAY | CUTANEOUS | Status: DC | PRN

## 2015-02-05 MED ORDER — FERROUS SULFATE 325 (65 FE) MG PO TABS
325.0000 mg | ORAL_TABLET | Freq: Every day | ORAL | Status: DC
  Administered 2015-02-05 – 2015-02-08 (×4): 325 mg via ORAL
  Filled 2015-02-05 (×4): qty 1

## 2015-02-05 MED ORDER — ALTEPLASE 2 MG IJ SOLR
2.0000 mg | Freq: Once | INTRAMUSCULAR | Status: DC | PRN
  Filled 2015-02-05: qty 2

## 2015-02-05 NOTE — Progress Notes (Addendum)
Pt. admitted to unit, [id number] from ED, report from [place]. Oriented to room, call bell, Ascom phones and staff. Patient is deaf, but can read lips. Bed in low position. Fall safety plan reviewed, contract signed and placed on wall, non-skid socks in place, bed alarm on. Full assessment to Epic; skin assessed with Caleen Jobs, RN. Telemetry box verified with tele clerk and [place] NT: [id number] . Will continue to monitor.

## 2015-02-05 NOTE — Progress Notes (Signed)
MRSA PCR ordered per protocol, result: negative. Contact precautions not needed at this time.

## 2015-02-05 NOTE — Progress Notes (Signed)
[facility]  ROUNDING NOTE   Subjective:  Patient well known to [place] as an outpatient as we follow for for outpatient dialysis. She has been having ongoing shortness of breath. She was found to have a left-sided pleural effusion as well as interstitial edema. She is currently on nasal cannula. She states that she's had difficulty sleeping and must sleep sitting up otherwise she feels suffocation.   Objective:  Vital signs in last 24 hours:  Temp:  [97.4 F (36.3 C)] 97.4 F (36.3 C) (01/17 0607) Pulse Rate:  [88-92] 92 (01/17 0830) Resp:  [14-25] 25 (01/17 0604) BP: (155-169)/(102-114) 167/107 mmHg (01/17 0830) SpO2:  [99 %-100 %] 99 % (01/17 0830) Weight:  [75.841 kg (167 lb 3.2 oz)-84 kg (185 lb 3 oz)] 75.841 kg (167 lb 3.2 oz) (01/17 0900)  Weight change:  Filed Weights   02/05/15 0602 02/05/15 0900  Weight: 84 kg (185 lb 3 oz) 75.841 kg (167 lb 3.2 oz)    Intake/Output:     Intake/Output this shift:     Physical Exam: General: Mild respiratory distress, sitting up in chair  Head: Normocephalic, atraumatic. Moist oral mucosal membranes  Eyes: Anicteric  Neck: Supple, trachea midline  Lungs:  diminshed at left base, rales in both lungs  Heart: S1S2 no rubs  Abdomen:  Soft, nontender, BS present   Extremities: 2+ peripheral edema.  Neurologic: Nonfocal, moving all four extremities  Skin: No lesions  Access: RUE access    Basic Metabolic Panel:  Recent Labs Lab 02/05/15 0611  NA 129*  K 6.1*  CL 91*  CO2 20*  GLUCOSE 82  BUN 44*  CREATININE 6.21*  CALCIUM 9.3  PHOS 8.9*    Liver Function Tests: No results for input(s): AST, ALT, ALKPHOS, BILITOT, PROT, ALBUMIN in the last 168 hours. No results for input(s): LIPASE, AMYLASE in the last 168 hours. No results for input(s): AMMONIA in the last 168 hours.  CBC:  Recent Labs Lab 02/05/15 0611  WBC 5.6  NEUTROABS 3.9  HGB 8.3*  HCT 25.6*  MCV 83.5  PLT 349    Cardiac  Enzymes:  Recent Labs Lab 02/05/15 0611  TROPONINI 0.07*    BNP: Invalid input(s): POCBNP  CBG:  Recent Labs Lab 02/05/15 0806  GLUCAP 67    Microbiology: Results for orders placed or performed during the hospital encounter of 12/01/14  Culture, blood (routine x 2)     Status: None   Collection Time: 12/01/14 11:34 AM  Result Value Ref Range Status   Specimen Description BLOOD LEFT ASSIST CONTROL  Final   Special Requests BAA,8ML,ANA,AER  Final   Culture  Setup Time   Final    GRAM POSITIVE COCCI IN BOTH AEROBIC AND ANAEROBIC BOTTLES CRITICAL RESULT CALLED TO, READ BACK BY AND VERIFIED WITH: JANE PHLUFMM AT [place] 12/02/14.PMH CONFIRMED BY [facility]    Culture   Final    VIRIDANS STREPTOCOCCUS IN BOTH AEROBIC AND ANAEROBIC BOTTLES    Report Status 12/04/2014 FINAL  Final   Organism ID, Bacteria VIRIDANS STREPTOCOCCUS  Final      Susceptibility   Viridans streptococcus - MIC*    ERYTHROMYCIN Value in next row Resistant      RESISTANT>=8    TETRACYCLINE Value in next row Resistant      RESISTANT>=16    VANCOMYCIN Value in next row Sensitive      SENSITIVE0.5    CEFTRIAXONE Value in next row Sensitive      SENSITIVE<=0.25  PENICILLIN Value in next row Sensitive      SENSITIVE0.12    LEVOFLOXACIN Value in next row Sensitive      SENSITIVE2    * VIRIDANS STREPTOCOCCUS  Culture, blood (routine x 2)     Status: None   Collection Time: 12/01/14 12:01 PM  Result Value Ref Range Status   Specimen Description BLOOD LEFT ARM  Final   Special Requests BOTTLES DRAWN AEROBIC AND ANAEROBIC 5CC  Final   Culture  Setup Time   Final    GRAM POSITIVE COCCI IN BOTH AEROBIC AND ANAEROBIC BOTTLES CRITICAL RESULT CALLED TO, READ BACK BY AND VERIFIED WITH: JANE PHLUFMM AT [place] 12/02/14.PMH CONFIRMED BY [facility]    Culture   Final    VIRIDANS STREPTOCOCCUS IN BOTH AEROBIC AND ANAEROBIC BOTTLES    Report Status 12/04/2014 FINAL  Final   Organism ID, Bacteria VIRIDANS STREPTOCOCCUS   Final      Susceptibility   Viridans streptococcus - MIC*    ERYTHROMYCIN Value in next row Resistant      RESISTANT>=8    TETRACYCLINE Value in next row Resistant      RESISTANT>=16    VANCOMYCIN Value in next row Sensitive      SENSITIVE0.5    PENICILLIN Value in next row Sensitive      SENSITIVE0.12    CEFTRIAXONE Value in next row Sensitive      SENSITIVE0.25    LEVOFLOXACIN Value in next row Sensitive      SENSITIVE2    * VIRIDANS STREPTOCOCCUS  MRSA PCR Screening     Status: Abnormal   Collection Time: 12/01/14  3:17 PM  Result Value Ref Range Status   MRSA by PCR POSITIVE (A) NEGATIVE Final    Comment:        The GeneXpert MRSA Assay (FDA approved for NASAL specimens only), is one component of a comprehensive MRSA colonization surveillance program. It is not intended to diagnose MRSA infection nor to guide or monitor treatment for MRSA infections. CRITICAL RESULT CALLED TO, READ BACK BY AND VERIFIED WITH: MADDIE HIMES AT [id number] ON 12/01/14 BY [facility]   Cath Tip Culture     Status: None   Collection Time: 12/03/14  4:45 PM  Result Value Ref Range Status   Specimen Description CATH TIP  Final   Special Requests NONE  Final   Culture NO GROWTH 3 DAYS  Final   Report Status 12/06/2014 FINAL  Final  C difficile quick scan w PCR reflex     Status: Abnormal   Collection Time: 12/04/14  2:00 PM  Result Value Ref Range Status   C Diff antigen POSITIVE (A) NEGATIVE Final   C Diff toxin NEGATIVE NEGATIVE Final   C Diff interpretation   Final    Negative for toxigenic C. difficile. Toxin gene and active toxin production not detected. May be a nontoxigenic strain of C. difficile bacteria present, lacking the ability to produce toxin.  Clostridium Difficile by PCR     Status: None   Collection Time: 12/04/14  2:00 PM  Result Value Ref Range Status   Toxigenic C Difficile by pcr NEGATIVE NEGATIVE Final  Culture, blood (routine x 2)     Status: None   Collection Time: 12/04/14   8:20 PM  Result Value Ref Range Status   Specimen Description BLOOD LEFT ARM  Final   Special Requests BOTTLES DRAWN AEROBIC AND ANAEROBIC 4CC  Final   Culture NO GROWTH 5 DAYS  Final   Report Status  12/09/2014 FINAL  Final    Coagulation Studies: No results for input(s): LABPROT, INR in the last 72 hours.  Urinalysis: No results for input(s): COLORURINE, LABSPEC, PHURINE, GLUCOSEU, HGBUR, BILIRUBINUR, KETONESUR, PROTEINUR, UROBILINOGEN, NITRITE, LEUKOCYTESUR in the last 72 hours.  Invalid input(s): APPERANCEUR    Imaging: Dg Chest 2 View  02/05/2015  CLINICAL DATA:  Dyspnea EXAM: CHEST  2 VIEW COMPARISON:  02/04/2015 FINDINGS: Unchanged cardiopericardial enlargement with marked central vessel enlargement, including the pulmonary arteries. Carina splaying from left atrial enlargement. Diffuse interstitial coarsening with moderate left pleural effusion and underlying atelectasis or consolidation. Streaky atelectatic type opacity at the right base. IMPRESSION: 1. Unchanged CHF with moderate left pleural effusion. 2. Bibasilar atelectasis with superimposed infection not excluded. 3. Chronic cardiomegaly and pulmonary hypertension. Electronically Signed   By: Monte Fantasia M.D.   On: 02/05/2015 06:56   Dg Chest 2 View  02/04/2015  CLINICAL DATA:  Chronic pleural effusion and shortness of breath. EXAM: CHEST  2 VIEW COMPARISON:  01/07/2015 FINDINGS: Moderate left pleural effusion again noted, stable. There is cardiomegaly with vascular congestion interstitial opacities throughout the lungs compatible with edema/ CHF, similar to prior study. Focal left lower lobe atelectasis or infiltrate. No real change since prior study. IMPRESSION: Continued low moderate left pleural effusion with left lower lobe atelectasis or infiltrate. Stable cardiomegaly with moderate CHF. Electronically Signed   By: Rolm Baptise M.D.   On: 02/04/2015 13:46     Medications:     . albuterol  2.5 mg Nebulization Q4H   . amLODipine  5 mg Oral Daily  . calcium carbonate  1 tablet Oral BID PC  . ferrous sulfate  325 mg Oral Daily  . furosemide  80 mg Oral Daily  . heparin  5,000 Units Subcutaneous 3 times per day  . Influenza vac split quadrivalent PF  0.5 mL Intramuscular Once  . isosorbide mononitrate  30 mg Oral Daily  . multivitamin  1 tablet Oral QHS  . pantoprazole  40 mg Oral Daily  . sevelamer carbonate  800 mg Oral TID WC   sodium chloride, sodium chloride, acetaminophen **OR** acetaminophen, alteplase, heparin, lidocaine (PF), lidocaine-prilocaine, ondansetron **OR** ondansetron (ZOFRAN) IV, pentafluoroprop-tetrafluoroeth, senna-docusate  Assessment/ Plan:  60 y.o. 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  1. End-stage renal disease on hemodialysis Tuesday, Thursday, Saturday.  The patient is due for her normal dialysis today. She appears to have increasing shortness of breath. We will consider additional dialysis treatment tomorrow as well.  2. Pulmonary edema/left pleural effusion/shortness of breath. Patient's shortness of breath appears to be multifactorial. She has pulmonary edema as well as a left pleural effusion. We will plan for hemodialysis today as well as tomorrow to see if we can remove some additional fluid.  She may also benefit from thoracentesis. Defer this to hospitalist.  3. Anemia of chronic kidney disease. The patient has received recent blood transfusion. Hemoglobin currently 8.3. Continue Epogen 10,000 units IV with dialysis.  4. Secondary hyperparathyroidism. Check intact PTH and phosphorus today. Continue Renvela for  now.  5. Hyperkalemia. Serum potassium noted as being high at 6.1. 2 potassium bath with dialysis today. Reevaluate potassium tomorrow.  6. Thanks for consultation.  LOS: 0 Ranyah Groeneveld 1/17/201710:02 AM

## 2015-02-05 NOTE — ED Notes (Signed)
Pt c/o SOB, was to go dialysis this AM, pt refused wanted to come to ED due her difficulty breathing, +4 bilateral LE edema, Hx HTN, ESRD

## 2015-02-05 NOTE — H&P (Addendum)
[place] [facility] [place] NAME: Kelsey Peterson    MR#:  [id number]  DATE OF BIRTH:  [birth year]  DATE OF ADMISSION:  02/05/2015  PRIMARY CARE PHYSICIAN: Lavera Guise, MD   REQUESTING/REFERRING PHYSICIAN: 02/05/15  CHIEF COMPLAINT:  Increasing shortness of breath  HISTORY OF PRESENT ILLNESS:  Kelsey Peterson  is a 60 y.o. female with a known history of incisional disease on hemodialysis, type 2 diabetes, hypertension, history of recurrent admissions for acute pulmonary edema comes to the emergency room with increasing shortness of breath for last week. Patient reports going to all her dialysis sessions. She started having shortness of breath early hours of the morning and came to the emergency room was found to be in pulmonary edema with moderate left pleural effusion. Patient had elevated blood pressure. Her potassium is 6.1 she is good to be dialyzed urgently. Dr. Holley Raring from nephrology is aware of it  PAST MEDICAL HISTORY:   Past Medical History  Diagnosis Date  . COPD (chronic obstructive pulmonary disease) ([place])   . ESRD on hemodialysis ([place])   . DM2 (diabetes mellitus, type 2) ([place])   . Hepatitis C   . Migraine with aura   . HOH (hard of hearing)   . Mitral regurgitation     a. echo 06/2014: EF 60%, no WMA, GR1DD, mild AI, calcified mitral annulus, mod MR, no atrial septal defect or patent foramen ovale   . Cognitive impairment   . CAD (coronary artery disease)   . HTN (hypertension)     PAST SURGICAL HISTOIRY:   Past Surgical History  Procedure Laterality Date  . Dialysis fistula creation    . Tubal ligation    . Colonoscopy with propofol Left 07/09/2014    Procedure: COLONOSCOPY WITH PROPOFOL;  Surgeon: Hulen Luster, MD;  Location: [facility] ENDOSCOPY;  Service: Endoscopy;  Laterality: Left;  . Colonoscopy N/A 07/15/2014    Procedure: COLONOSCOPY;  Surgeon: Manya Silvas, MD;  Location: [facility] ENDOSCOPY;  Service: Endoscopy;   Laterality: N/A;    SOCIAL HISTORY:   Social History  Substance Use Topics  . Smoking status: Never Smoker   . Smokeless tobacco: Never Used  . Alcohol Use: No    FAMILY HISTORY:   Family History  Problem Relation Age of Onset  . Heart disease Mother   . Hypertension Mother     DRUG ALLERGIES:   Allergies  Allergen Reactions  . Contrast Media [Iodinated Diagnostic Agents] Other (See Comments)    Unknown reaction  . Morphine And Related Hives  . Other Rash    Blood Pressure Pill (Pt doesn't remember name)     REVIEW OF SYSTEMS:  Review of Systems  Constitutional: Negative for fever, chills and weight loss.  HENT: Positive for hearing loss. Negative for ear discharge, ear pain and nosebleeds.   Eyes: Negative for blurred vision, pain and discharge.  Respiratory: Positive for shortness of breath. Negative for sputum production, wheezing and stridor.   Cardiovascular: Negative for chest pain, palpitations, orthopnea and PND.  Gastrointestinal: Negative for nausea, vomiting, abdominal pain and diarrhea.  Genitourinary: Negative for urgency and frequency.  Musculoskeletal: Negative for back pain and joint pain.  Neurological: Positive for weakness. Negative for sensory change, speech change and focal weakness.  Psychiatric/Behavioral: Negative for depression and hallucinations. The patient is not nervous/anxious.   All other systems reviewed and are negative.    MEDICATIONS AT HOME:   Prior to Admission medications   Medication  Sig Start Date End Date Taking? Authorizing Provider  albuterol (PROVENTIL HFA;VENTOLIN HFA) 108 (90 BASE) MCG/ACT inhaler Inhale 2 puffs into the lungs every 6 (six) hours as needed for wheezing or shortness of breath. Patient taking differently: Inhale 2 puffs into the lungs every 4 (four) hours as needed for wheezing or shortness of breath.  09/06/14   [name], MD  albuterol (PROVENTIL) (2.5 MG/3ML) 0.083% nebulizer solution Take 2.5  mg by nebulization every 4 (four) hours.    Historical Provider, MD  amLODipine (NORVASC) 5 MG tablet Take 5 mg by mouth daily.    Historical Provider, MD  calcium carbonate (TUMS - DOSED IN MG ELEMENTAL CALCIUM) 500 MG chewable tablet Chew 1 tablet by mouth 2 (two) times daily after a meal. Take 2 hours after meal.    Historical Provider, MD  clopidogrel (PLAVIX) 75 MG tablet Take 75 mg by mouth daily. 08/31/14   Historical Provider, MD  esomeprazole (NEXIUM) 40 MG capsule Take 40 mg by mouth daily.     Historical Provider, MD  ferrous sulfate 325 (65 FE) MG tablet Take 325 mg by mouth daily.    Historical Provider, MD  folic acid-vitamin b complex-vitamin c-selenium-zinc (DIALYVITE) 3 MG TABS tablet Take 1 tablet by mouth daily.    Historical Provider, MD  furosemide (LASIX) 80 MG tablet Take 80 mg by mouth daily. Patient takes on non dialysis days.    Historical Provider, MD  isosorbide mononitrate (IMDUR) 30 MG 24 hr tablet Take 30 mg by mouth daily.    Historical Provider, MD  Lidocaine-Prilocaine, Bulk, [id number] % CREA 1 application by Other route as needed (for tx). Apply to site 20-45 minutes before tx.    Historical Provider, MD  sevelamer carbonate (RENVELA) 800 MG tablet Take 800 mg by mouth 3 (three) times daily with meals.    Historical Provider, MD      VITAL SIGNS:  Blood pressure 157/102, pulse 88, temperature 97.4 F (36.3 C), temperature source Oral, resp. rate 25, height 5\' 3"  (1.6 m), weight 84 kg (185 lb 3 oz), SpO2 100 %.  PHYSICAL EXAMINATION:  GENERAL:  60 y.o.-year-old patient lying in the bed with no acute distress.  EYES: Pupils equal, round, reactive to light and accommodation. No scleral icterus. Extraocular muscles intact.  HEENT: Head atraumatic, normocephalic. Oropharynx and nasopharynx clear.  NECK:  Supple, no jugular venous distention. No thyroid enlargement, no tenderness.  LUNGS: Distant breath sounds bilaterally, no wheezing, rales,rhonchi or crepitation. No  use of accessory muscles of respiration.  CARDIOVASCULAR: S1, S2 normal. No murmurs, rubs, or gallops. Mild tachycardia ABDOMEN: Soft, nontender, nondistended. Bowel sounds present. No organomegaly or mass.  EXTREMITIES: No pedal edema, cyanosis, or clubbing.  NEUROLOGIC: Cranial nerves II through XII are intact. Muscle strength 5/5 in all extremities. Sensation intact. Gait not checked. Hearing loss PSYCHIATRIC: The patient is alert and oriented x 3.  SKIN: No obvious rash, lesion, or ulcer.   LABORATORY PANEL:   CBC  Recent Labs Lab 02/05/15 0611  WBC 5.6  HGB 8.3*  HCT 25.6*  PLT 349   ------------------------------------------------------------------------------------------------------------------  Chemistries   Recent Labs Lab 02/05/15 0611  NA 129*  K 6.1*  CL 91*  CO2 20*  GLUCOSE 82  BUN 44*  CREATININE 6.21*  CALCIUM 9.3   ------------------------------------------------------------------------------------------------------------------  Cardiac Enzymes  Recent Labs Lab 02/05/15 0611  TROPONINI 0.07*   ------------------------------------------------------------------------------------------------------------------  RADIOLOGY:  Dg Chest 2 View  02/05/2015  CLINICAL DATA:  Dyspnea EXAM: CHEST  2  VIEW COMPARISON:  02/04/2015 FINDINGS: Unchanged cardiopericardial enlargement with marked central vessel enlargement, including the pulmonary arteries. Carina splaying from left atrial enlargement. Diffuse interstitial coarsening with moderate left pleural effusion and underlying atelectasis or consolidation. Streaky atelectatic type opacity at the right base. IMPRESSION: 1. Unchanged CHF with moderate left pleural effusion. 2. Bibasilar atelectasis with superimposed infection not excluded. 3. Chronic cardiomegaly and pulmonary hypertension. Electronically Signed   By: Monte Fantasia M.D.   On: 02/05/2015 06:56   Dg Chest 2 View  02/04/2015  CLINICAL DATA:  Chronic  pleural effusion and shortness of breath. EXAM: CHEST  2 VIEW COMPARISON:  01/07/2015 FINDINGS: Moderate left pleural effusion again noted, stable. There is cardiomegaly with vascular congestion interstitial opacities throughout the lungs compatible with edema/ CHF, similar to prior study. Focal left lower lobe atelectasis or infiltrate. No real change since prior study. IMPRESSION: Continued low moderate left pleural effusion with left lower lobe atelectasis or infiltrate. Stable cardiomegaly with moderate CHF. Electronically Signed   By: Rolm Baptise M.D.   On: 02/04/2015 13:46    IMPRESSION AND PLAN:   60 y.o.70 American female with hypertension, anemia of chronic kidney disease, ESRD on HD TTHS, secondary hyperparathyroidism, hx of meningitis as a child with resultant hearing loss, hx of lytic lesions in ileum followed by oncology, h/o MSSA bacteremia,   * Acute recurrent Pulmonary edema with Fluid overload & hypoxia:  -Urgent  Dialysis per nephrology given hyperkalemia K 6.1 -On Tuesday Thursday Saturday schedule.  -she has nocturnal home oxygen. - Likely has underlying diastolic CHF.  *chronic moderate pleural effusion. Will get IR to do therapuetic thoracentesis. Will hold plavix  * Elevated troponin, possible due to demanding ischemia:  - No acute MI. Monitor at this time.  * ESRD on hemodialysis: Nephrology managing dialysis -Tuesday Thursday Saturday schedule.  Has a right chest permacath . Also has a right arm fistula but according to the patient that is not working.  * Anemia of chronic kidney disease:  -hgb 8.3 -hemoglobin stable. Cont iron pills. - GI w/u in June 2016 and  EGD was  normal, colonoscopy with polyps and no malignancy  *Malignant HTN Cont amlodipine, imdur and lasix  All the records are reviewed and case discussed with ED provider. Management plans discussed with the patient, family and they are in agreement.  CODE STATUS: heparin  TOTAL critical  TIME TAKING CARE OF THIS PATIENT:55 minutes.    Rishikesh Khachatryan M.D on 02/05/2015 at 7:29 AM  Between 7am to 6pm - Pager - [phone]  After 6pm go to [email or web address] - password [email or web address] [facility]  Office  [phone]  CC: Primary care physician; Lavera Guise, MD

## 2015-02-05 NOTE — Progress Notes (Signed)
Patients BP remaining high, no PRN orders currently. Pt also complaining of dizziness while lying in bed. Filed Vitals:   02/05/15 1041 02/05/15 1106  BP: 202/113 181/82  Pulse: 83 79  Temp:  98 F (36.7 C)  Resp: 22 18   Dr. Posey Pronto paged and updated, PRN orders for BP received.

## 2015-02-05 NOTE — ED Notes (Signed)
MD at bedside. 

## 2015-02-05 NOTE — ED Provider Notes (Signed)
[facility] Emergency Department Provider Note  ____________________________________________  Time seen: 6:00 AM  I have reviewed the triage vital signs and the nursing notes.   HISTORY  Chief Complaint Shortness of Breath     HPI Kelsey Peterson is a 60 y.o. female presents with respiratory distress and bilateral lower extremity swelling. Per EMS on their arrival patient's oxygen saturation was poor but improved to 90s on 8 L nasal cannula. Patient denies any chest pain. Patient has a history of end-stage renal disease for which she has dialysis every Tuesday Thursday Saturday. Patient states that she has not missed any treatments. Of note patient states that she was in her physician's office yesterday secondary to dyspnea.    Past Medical History  Diagnosis Date  . COPD (chronic obstructive pulmonary disease) ([place])   . ESRD on hemodialysis ([place])   . DM2 (diabetes mellitus, type 2) ([place])   . Hepatitis C   . Migraine with aura   . HOH (hard of hearing)   . Mitral regurgitation     a. echo 06/2014: EF 60%, no WMA, GR1DD, mild AI, calcified mitral annulus, mod MR, no atrial septal defect or patent foramen ovale   . Cognitive impairment   . CAD (coronary artery disease)   . HTN (hypertension)     Patient Active Problem List   Diagnosis Date Noted  . Acute pulmonary edema ([place]) 01/11/2015  . Cholelithiasis 12/01/2014  . Hyperkalemia 09/01/2014  . Fluid overload 09/01/2014  . Malignant hypertension 09/01/2014  . GI bleed 07/14/2014  . GERD (gastroesophageal reflux disease) 07/14/2014  . COPD (chronic obstructive pulmonary disease) ([employer]) 07/14/2014  . HTN (hypertension) 07/14/2014  . CAD (coronary artery disease) 07/14/2014  . End stage renal disease on dialysis ([place])   . Supraventricular tachycardia ([place])   . NSVT (nonsustained ventricular tachycardia) ([place])   . Pulmonary edema 07/03/2014  . Anemia 07/03/2014  . Pain in the chest   . Diarrhea    . NSTEMI (non-ST elevated myocardial infarction) ([place]) 06/21/2014    Past Surgical History  Procedure Laterality Date  . Dialysis fistula creation    . Tubal ligation    . Colonoscopy with propofol Left 07/09/2014    Procedure: COLONOSCOPY WITH PROPOFOL;  Surgeon: Hulen Luster, MD;  Location: [facility] ENDOSCOPY;  Service: Endoscopy;  Laterality: Left;  . Colonoscopy N/A 07/15/2014    Procedure: COLONOSCOPY;  Surgeon: Manya Silvas, MD;  Location: [facility] ENDOSCOPY;  Service: Endoscopy;  Laterality: N/A;    Current Outpatient Rx  Name  Route  Sig  Dispense  Refill  . albuterol (PROVENTIL HFA;VENTOLIN HFA) 108 (90 BASE) MCG/ACT inhaler   Inhalation   Inhale 2 puffs into the lungs every 6 (six) hours as needed for wheezing or shortness of breath. Patient taking differently: Inhale 2 puffs into the lungs every 4 (four) hours as needed for wheezing or shortness of breath.    1 Inhaler   2   . albuterol (PROVENTIL) (2.5 MG/3ML) 0.083% nebulizer solution   Nebulization   Take 2.5 mg by nebulization every 4 (four) hours.         Marland Kitchen amLODipine (NORVASC) 5 MG tablet   Oral   Take 5 mg by mouth daily.         . calcium carbonate (TUMS - DOSED IN MG ELEMENTAL CALCIUM) 500 MG chewable tablet   Oral   Chew 1 tablet by mouth 2 (two) times daily after a meal. Take 2 hours after meal.         .  clopidogrel (PLAVIX) 75 MG tablet   Oral   Take 75 mg by mouth daily.         Marland Kitchen esomeprazole (NEXIUM) 40 MG capsule   Oral   Take 40 mg by mouth daily.          . ferrous sulfate 325 (65 FE) MG tablet   Oral   Take 325 mg by mouth daily.         . folic acid-vitamin b complex-vitamin c-selenium-zinc (DIALYVITE) 3 MG TABS tablet   Oral   Take 1 tablet by mouth daily.         . furosemide (LASIX) 80 MG tablet   Oral   Take 80 mg by mouth daily. Patient takes on non dialysis days.         . isosorbide mononitrate (IMDUR) 30 MG 24 hr tablet   Oral   Take 30 mg by mouth daily.          . Lidocaine-Prilocaine, Bulk, 2.5-2.5 % CREA   Other   1 application by Other route as needed (for tx). Apply to site 20-45 minutes before tx.         . sevelamer carbonate (RENVELA) 800 MG tablet   Oral   Take 800 mg by mouth 3 (three) times daily with meals.           Allergies Contrast media; Morphine and related; and Other  Family History  Problem Relation Age of Onset  . Heart disease Mother   . Hypertension Mother     Social History Social History  Substance Use Topics  . Smoking status: Never Smoker   . Smokeless tobacco: Never Used  . Alcohol Use: No    Review of Systems  Constitutional: Negative for fever. Eyes: Negative for visual changes. ENT: Negative for sore throat. Cardiovascular: Negative for chest pain. Respiratory: Positive for shortness of breath. Gastrointestinal: Negative for abdominal pain, vomiting and diarrhea. Genitourinary: Negative for dysuria. Musculoskeletal: Negative for back pain. Positive for leg swelling Skin: Negative for rash. Neurological: Negative for headaches, focal weakness or numbness.   10-point ROS otherwise negative.  ____________________________________________   PHYSICAL EXAM:  VITAL SIGNS: ED Triage Vitals  Enc Vitals Group     BP 02/05/15 0608 169/114 mmHg     Pulse Rate 02/05/15 0604 92     Resp 02/05/15 0604 25     Temp 02/05/15 0607 97.4 F (36.3 C)     Temp Source 02/05/15 0607 Oral     SpO2 02/05/15 0604 100 %     Weight 02/05/15 0602 185 lb 3 oz (84 kg)     Height 02/05/15 0602 5\' 3"  (1.6 m)     Head Cir --      Peak Flow --      Pain Score --      Pain Loc --      Pain Edu? --      Excl. in [place]? --     Constitutional: Alert and oriented. Well appearing and in no distress. Eyes: Conjunctivae are normal. PERRL. Normal extraocular movements. ENT   Head: Normocephalic and atraumatic.   Nose: No congestion/rhinnorhea.   Mouth/Throat: Mucous membranes are moist.   Neck: No  stridor. Hematological/Lymphatic/Immunilogical: No cervical lymphadenopathy. Cardiovascular: Normal rate, regular rhythm. Normal and symmetric distal pulses are present in all extremities. No murmurs, rubs, or gallops. Respiratory: Tachypnea, positive accessory muscle use Bibasilar rales Gastrointestinal: Soft and nontender. No distention. There is no CVA tenderness. Genitourinary: deferred Musculoskeletal: Nontender  with normal range of motion in all extremities. No joint effusions.  3+ bilateral lower extremity edema. Neurologic:  Normal speech and language. No gross focal neurologic deficits are appreciated. Speech is normal.  Skin:  Skin is warm, dry and intact. No rash noted. Psychiatric: Mood and affect are normal. Speech and behavior are normal. Patient exhibits appropriate insight and judgment.  ____________________________________________    LABS (pertinent positives/negatives)  Labs Reviewed  CBC WITH DIFFERENTIAL/PLATELET - Abnormal; Notable for the following:    RBC 3.07 (*)    Hemoglobin 8.3 (*)    HCT 25.6 (*)    RDW 18.2 (*)    Lymphs Abs 0.9 (*)    All other components within normal limits  BLOOD GAS, ARTERIAL - Abnormal; Notable for the following:    pO2, Arterial 67 (*)    Allens test (pass/fail) POSITIVE (*)    All other components within normal limits  BASIC METABOLIC PANEL  BRAIN NATRIURETIC PEPTIDE  TROPONIN I     ____________________________________________   EKG  ED ECG REPORT I, Calianna Kim, [place] N, the attending physician, personally viewed and interpreted this ECG.   Date: 02/05/2015  EKG Time: 6:05 AM  Rate: 92  Rhythm: Normal sinus rhythm  Axis: None  Intervals: Normal  ST&T Change: None   ____________________________________________    RADIOLOGY   DG Chest 1 View (Final result) Result time: 02/05/15 13:54:36   Final result by Rad Results In Interface (02/05/15 13:54:36)   Narrative:   CLINICAL DATA: Status post 1.1 L left  thoracentesis. History of COPD, end-stage renal disease, CHF, shortness of breath  EXAM: CHEST 1 VIEW  COMPARISON: 02/05/2015  FINDINGS: Near complete resolution of the left effusion following thoracentesis. No pneumothorax. Heart remains enlarged with vascular congestion and residual basilar atelectasis. Aorta is atherosclerotic and tortuous. Trachea midline.  IMPRESSION: Near complete resolution of the left effusion following thoracentesis. No pneumothorax.   Electronically Signed By: Jerilynn Mages. Shick M.D. On: 02/05/2015 13:54          US THORACENTESIS ASP PLEURAL SPACE W/IMG GUIDE (Final result) Result time: 02/05/15 10:45:32   Procedure changed from IR THORACENTESIS ASP PLEURAL SPACE W/IMG GUIDE      Final result by Rad Results In Interface (02/05/15 10:45:32)   Narrative:   CLINICAL DATA: CHF, left effusion, respiratory difficulty  EXAM: ULTRASOUND GUIDED LEFT THORACENTESIS  COMPARISON: 02/05/2015  PROCEDURE: An ultrasound guided thoracentesis was thoroughly discussed with the patient and questions answered. The benefits, risks, alternatives and complications were also discussed. The patient understands and wishes to proceed with the procedure. Written consent was obtained.  Ultrasound was performed to localize and mark an adequate pocket of fluid in the left chest. The area was then prepped and draped in the normal sterile fashion. 1% Lidocaine was used for local anesthesia. Under ultrasound guidance a safety centesis needle catheter was introduced. Thoracentesis was performed. The catheter was removed and a dressing applied.  Complications: None immediate  FINDINGS: A total of approximately 1.1 L of clear pleural fluid was removed. A fluid sample was notsent for laboratory analysis.  IMPRESSION: Successful ultrasound guided left thoracentesis yielding 1.1 L of pleural fluid.   Electronically Signed By: Jerilynn Mages. Shick M.D. On: 02/05/2015  10:45          DG Chest 2 View (Final result) Result time: 02/05/15 06:56:23   Final result by Rad Results In Interface (02/05/15 06:56:23)   Narrative:   CLINICAL DATA: Dyspnea  EXAM: CHEST 2 VIEW  COMPARISON: 02/04/2015  FINDINGS: Unchanged  cardiopericardial enlargement with marked central vessel enlargement, including the pulmonary arteries. Carina splaying from left atrial enlargement. Diffuse interstitial coarsening with moderate left pleural effusion and underlying atelectasis or consolidation. Streaky atelectatic type opacity at the right base.  IMPRESSION: 1. Unchanged CHF with moderate left pleural effusion. 2. Bibasilar atelectasis with superimposed infection not excluded. 3. Chronic cardiomegaly and pulmonary hypertension.   Electronically Signed By: Monte Fantasia M.D. On: 02/05/2015 06:56       Critical Care performed: CRITICAL CARE Performed by: Marjean Donna N   Total critical care time: 30 minutes  Critical care time was exclusive of separately billable procedures and treating other patients.  Critical care was necessary to treat or prevent imminent or life-threatening deterioration.  Critical care was time spent personally by me on the following activities: development of treatment plan with patient and/or surrogate as well as nursing, discussions with consultants, evaluation of patient's response to treatment, examination of patient, obtaining history from patient or surrogate, ordering and performing treatments and interventions, ordering and review of laboratory studies, ordering and review of radiographic studies, pulse oximetry and re-evaluation of patient's condition.   ____________________________________________   INITIAL IMPRESSION / ASSESSMENT AND PLAN / ED COURSE  Pertinent labs & imaging results that were available during my care of the patient were reviewed by me and considered in my medical decision making (see chart  for details).  Patient received Duoneb x 2 Lasix 60 mg IV given Patient discussed with Dr. Holley Raring nephrologist on call with plan for dialysis this morning.  ____________________________________________   FINAL CLINICAL IMPRESSION(S) / ED DIAGNOSES  Final diagnoses:  Pleural effusion  S/P thoracentesis      Gregor Hams, MD 02/06/15 [phone]

## 2015-02-05 NOTE — Progress Notes (Signed)
Pre-hd tx 

## 2015-02-05 NOTE — Progress Notes (Signed)
No results from cxr post thoracentesis, this RN called radiology to follow up. They will call rad tech to f/u.

## 2015-02-05 NOTE — ED Notes (Addendum)
Unable to obtain PIV at this time, 2 attempts made w/o success, RT at bedside for AB, XRay at bedside for CXR, will attempt PIV upon return from xray

## 2015-02-05 NOTE — Progress Notes (Signed)
Hemodialysis start 

## 2015-02-05 NOTE — Care Management Note (Signed)
Patient is active at [facility].  On TTS schedule.  Admission records have been sent and additional records will be sent at discharge.  Iran Sizer  Dialysis Liaison  [phone]

## 2015-02-06 LAB — CBC
HCT: 23.9 % — ABNORMAL LOW (ref 35.0–47.0)
HEMOGLOBIN: 7.8 g/dL — AB (ref 12.0–16.0)
MCH: 27.7 pg (ref 26.0–34.0)
MCHC: 32.7 g/dL (ref 32.0–36.0)
MCV: 84.7 fL (ref 80.0–100.0)
Platelets: 217 10*3/uL (ref 150–440)
RBC: 2.82 MIL/uL — AB (ref 3.80–5.20)
RDW: 18.2 % — ABNORMAL HIGH (ref 11.5–14.5)
WBC: 7.5 10*3/uL (ref 3.6–11.0)

## 2015-02-06 LAB — BASIC METABOLIC PANEL
ANION GAP: 9 (ref 5–15)
BUN: 33 mg/dL — ABNORMAL HIGH (ref 6–20)
CALCIUM: 8.9 mg/dL (ref 8.9–10.3)
CHLORIDE: 96 mmol/L — AB (ref 101–111)
CO2: 29 mmol/L (ref 22–32)
Creatinine, Ser: 5.02 mg/dL — ABNORMAL HIGH (ref 0.44–1.00)
GFR calc non Af Amer: 9 mL/min — ABNORMAL LOW (ref >60)
GFR, EST AFRICAN AMERICAN: 10 mL/min — AB (ref >60)
Glucose, Bld: 85 mg/dL (ref 65–99)
Potassium: 4.9 mmol/L (ref 3.5–5.1)
Sodium: 134 mmol/L — ABNORMAL LOW (ref 135–145)

## 2015-02-06 LAB — PARATHYROID HORMONE, INTACT (NO CA): PTH: 434 pg/mL — ABNORMAL HIGH (ref 15–65)

## 2015-02-06 MED ORDER — LISINOPRIL 5 MG PO TABS
2.5000 mg | ORAL_TABLET | Freq: Every day | ORAL | Status: DC
  Administered 2015-02-06 – 2015-02-08 (×2): 2.5 mg via ORAL
  Filled 2015-02-06 (×2): qty 1

## 2015-02-06 MED ORDER — OXYCODONE-ACETAMINOPHEN 5-325 MG PO TABS
1.0000 | ORAL_TABLET | Freq: Four times a day (QID) | ORAL | Status: DC | PRN
  Administered 2015-02-06: 1 via ORAL
  Filled 2015-02-06: qty 1

## 2015-02-06 MED ORDER — IBUPROFEN 400 MG PO TABS
400.0000 mg | ORAL_TABLET | Freq: Four times a day (QID) | ORAL | Status: DC | PRN
  Filled 2015-02-06: qty 1

## 2015-02-06 MED ORDER — SEVELAMER CARBONATE 800 MG PO TABS
1600.0000 mg | ORAL_TABLET | Freq: Three times a day (TID) | ORAL | Status: DC
  Filled 2015-02-06: qty 2

## 2015-02-06 NOTE — Progress Notes (Signed)
[place] at [facility]                                                                                                                                                                                            Patient Demographics   Kelsey Peterson, is a 60 y.o. female, DOB - Feb 19, 1955, [id number]  Admit date - 02/05/2015   Admitting Physician Fritzi Mandes, MD  Outpatient Primary MD for the patient is [facility], Timoteo Gaul, MD   LOS - 1  Subjective: Patient's breathing is improved a little confused she also had thoracentesis done earlier     Review of Systems:   CONSTITUTIONAL: Unable to provide due to some confusion  Vitals:   Filed Vitals:   02/06/15 1233 02/06/15 1236 02/06/15 1300 02/06/15 1330  BP:  127/75 125/71 130/77  Pulse:      Temp:  98.1 F (36.7 C)    TempSrc:  Oral    Resp:  18 18 20   Height:      Weight:      SpO2: 99%       Wt Readings from Last 3 Encounters:  02/05/15 75.841 kg (167 lb 3.2 oz)  12/06/14 86.2 kg (190 lb 0.6 oz)  11/02/14 83.915 kg (185 lb)     Intake/Output Summary (Last 24 hours) at 02/06/15 1507 Last data filed at 02/06/15 0830  Gross per 24 hour  Intake    240 ml  Output  -3000 ml  Net   3240 ml    Physical Exam:   GENERAL: Pleasant-appearing in no apparent distress.  HEAD, EYES, EARS, NOSE AND THROAT: Atraumatic, normocephalic. Extraocular muscles are intact. Pupils equal and reactive to light. Sclerae anicteric. No conjunctival injection. No oro-pharyngeal erythema.  NECK: Supple. There is no jugular venous distention. No bruits, no lymphadenopathy, no thyromegaly.  HEART: Regular rate and rhythm,. No murmurs, no rubs, no clicks.  LUNGS: Clear to auscultation bilaterally. No rales or rhonchi. No wheezes.  ABDOMEN: Soft, flat, nontender, nondistended. Has good bowel sounds. No hepatosplenomegaly appreciated.  EXTREMITIES: No evidence of any cyanosis, clubbing, or peripheral edema.  +2 pedal  and radial pulses bilaterally.  NEUROLOGIC: The patient is alert, awake, and oriented x3 with no focal motor or sensory deficits appreciated bilaterally.  SKIN: Moist and warm with no rashes appreciated.  Psych: Not anxious, depressed LN: No inguinal LN enlargement    Antibiotics   Anti-infectives    None      Medications   Scheduled Meds: . albuterol  2.5 mg Nebulization Q4H  . amiodarone  200 mg Oral BID  . amLODipine  5 mg Oral Daily  . calcium acetate  1,334 mg Oral Daily  . calcium carbonate  1 tablet Oral BID PC  . cinacalcet  60 mg Oral Q breakfast  . clopidogrel  75 mg Oral Daily  . ferrous sulfate  325 mg Oral Daily  . furosemide  80 mg Oral Daily  . heparin  5,000 Units Subcutaneous 3 times per day  . hydrALAZINE  25 mg Oral 3 times per day  . isosorbide mononitrate  30 mg Oral Daily  . lisinopril  2.5 mg Oral Daily  . metoprolol tartrate  12.5 mg Oral BID  . mometasone-formoterol  2 puff Inhalation BID  . multivitamin  1 tablet Oral QHS  . pantoprazole  40 mg Oral Daily  . sevelamer carbonate  1,600 mg Oral TID WC  . tiotropium  1 capsule Inhalation Daily   Continuous Infusions:  PRN Meds:.sodium chloride, sodium chloride, acetaminophen **OR** acetaminophen, albuterol, alteplase, heparin, hydrALAZINE, ibuprofen, lidocaine (PF), lidocaine-prilocaine, ondansetron **OR** ondansetron (ZOFRAN) IV, oxyCODONE-acetaminophen, pentafluoroprop-tetrafluoroeth, senna-docusate   Data Review:   Micro Results Recent Results (from the past 240 hour(s))  MRSA PCR Screening     Status: None   Collection Time: 02/05/15 12:00 PM  Result Value Ref Range Status   MRSA by PCR NEGATIVE NEGATIVE Final    Comment:        The GeneXpert MRSA Assay (FDA approved for NASAL specimens only), is one component of a comprehensive MRSA colonization surveillance program. It is not intended to diagnose MRSA infection nor to guide or monitor treatment for MRSA infections.      Radiology Reports Dg Chest 1 View  02/05/2015  CLINICAL DATA:  Status post 1.1 L left thoracentesis. History of COPD, end-stage renal disease, CHF, shortness of breath EXAM: CHEST 1 VIEW COMPARISON:  02/05/2015 FINDINGS: Near complete resolution of the left effusion following thoracentesis. No pneumothorax. Heart remains enlarged with vascular congestion and residual basilar atelectasis. Aorta is atherosclerotic and tortuous. Trachea midline. IMPRESSION: Near complete resolution of the left effusion following thoracentesis. No pneumothorax. Electronically Signed   By: Jerilynn Mages.  Shick M.D.   On: 02/05/2015 13:54   Dg Chest 2 View  02/05/2015  CLINICAL DATA:  Dyspnea EXAM: CHEST  2 VIEW COMPARISON:  02/04/2015 FINDINGS: Unchanged cardiopericardial enlargement with marked central vessel enlargement, including the pulmonary arteries. Carina splaying from left atrial enlargement. Diffuse interstitial coarsening with moderate left pleural effusion and underlying atelectasis or consolidation. Streaky atelectatic type opacity at the right base. IMPRESSION: 1. Unchanged CHF with moderate left pleural effusion. 2. Bibasilar atelectasis with superimposed infection not excluded. 3. Chronic cardiomegaly and pulmonary hypertension. Electronically Signed   By: Monte Fantasia M.D.   On: 02/05/2015 06:56   Dg Chest 2 View  02/04/2015  CLINICAL DATA:  Chronic pleural effusion and shortness of breath. EXAM: CHEST  2 VIEW COMPARISON:  01/07/2015 FINDINGS: Moderate left pleural effusion again noted, stable. There is cardiomegaly with vascular congestion interstitial opacities throughout the lungs compatible with edema/ CHF, similar to prior study. Focal left lower lobe atelectasis or infiltrate. No real change since prior study. IMPRESSION: Continued low moderate left pleural effusion with left lower lobe atelectasis or infiltrate. Stable cardiomegaly with moderate CHF. Electronically Signed   By: Rolm Baptise M.D.   On:  02/04/2015 13:46   US Thoracentesis Asp Pleural Space W/img Guide  02/05/2015  CLINICAL DATA:  CHF, left effusion, respiratory difficulty EXAM: ULTRASOUND GUIDED LEFT THORACENTESIS COMPARISON:  02/05/2015 PROCEDURE: An ultrasound guided thoracentesis was thoroughly discussed  with the patient and questions answered. The benefits, risks, alternatives and complications were also discussed. The patient understands and wishes to proceed with the procedure. Written consent was obtained. Ultrasound was performed to localize and mark an adequate pocket of fluid in the left chest. The area was then prepped and draped in the normal sterile fashion. 1% Lidocaine was used for local anesthesia. Under ultrasound guidance a safety centesis needle catheter was introduced. Thoracentesis was performed. The catheter was removed and a dressing applied. Complications:  None immediate FINDINGS: A total of approximately 1.1 L of clear pleural fluid was removed. A fluid sample was notsent for laboratory analysis. IMPRESSION: Successful ultrasound guided left thoracentesis yielding 1.1 L of pleural fluid. Electronically Signed   By: Jerilynn Mages.  Shick M.D.   On: 02/05/2015 10:45     CBC  Recent Labs Lab 02/05/15 0611 02/06/15 0525  WBC 5.6 7.5  HGB 8.3* 7.8*  HCT 25.6* 23.9*  PLT 349 217  MCV 83.5 84.7  MCH 27.2 27.7  MCHC 32.5 32.7  RDW 18.2* 18.2*  LYMPHSABS 0.9*  --   MONOABS 0.5  --   EOSABS 0.2  --   BASOSABS 0.1  --     Chemistries   Recent Labs Lab 02/05/15 0611 02/06/15 0525  NA 129* 134*  K 6.1* 4.9  CL 91* 96*  CO2 20* 29  GLUCOSE 82 85  BUN 44* 33*  CREATININE 6.21* 5.02*  CALCIUM 9.3 8.9   ------------------------------------------------------------------------------------------------------------------ estimated creatinine clearance is 11.6 mL/min (by C-G formula based on Cr of  5.02). ------------------------------------------------------------------------------------------------------------------ No results for input(s): HGBA1C in the last 72 hours. ------------------------------------------------------------------------------------------------------------------ No results for input(s): CHOL, HDL, LDLCALC, TRIG, CHOLHDL, LDLDIRECT in the last 72 hours. ------------------------------------------------------------------------------------------------------------------ No results for input(s): TSH, T4TOTAL, T3FREE, THYROIDAB in the last 72 hours.  Invalid input(s): FREET3 ------------------------------------------------------------------------------------------------------------------ No results for input(s): VITAMINB12, FOLATE, FERRITIN, TIBC, IRON, RETICCTPCT in the last 72 hours.  Coagulation profile No results for input(s): INR, PROTIME in the last 168 hours.  No results for input(s): DDIMER in the last 72 hours.  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   *chronic moderate pleural effusion.  Post thoracentesis  * Elevated troponin, possible due to demanding ischemia:  - No acute MI. Monitor at this time.  * ESRD on hemodialysis: Nephrology managing dialysis -More dialysis today  * Anemia of chronic kidney disease:  -hgb 8.3 -hemoglobin stable. Cont  iron pills. - GI w/u in June 2016 and EGD was normal, colonoscopy with polyps and no malignancy  *Malignant HTN-blood pressure control  Cont amlodipine, imdur and lasix      Code Status Orders        Start     Ordered   02/05/15 0918  Full code   Continuous     02/05/15 0917    Code Status History    Date Active Date Inactive Code Status Order ID Comments User Context   12/01/2014  3:08 PM 12/07/2014  7:15 PM Full Code [id number]  Demetrios Loll, MD Inpatient  09/01/2014  5:57 PM 09/06/2014  2:09 PM Full Code [id number]  Demetrios Loll, MD ED   07/14/2014  5:17 AM 07/16/2014  6:40 PM Full Code [id number]  Lance Coon, MD Inpatient   07/03/2014  2:06 PM 07/10/2014  7:11 PM Full Code [id number]  Demetrios Loll, MD Inpatient   06/21/2014  1:35 PM 06/22/2014  4:33 PM Full Code [id number]  Max Sane, MD Inpatient           Consults  nephrology  DVT Prophylaxis  heparin  Lab Results  Component Value Date   PLT 217 02/06/2015     Time Spent in minutes  29min   Dustin Flock M.D on 02/06/2015 at 3:07 PM  Between 7am to 6pm - Pager - [phone]  After 6pm go to [email or web address] - password [email or web address] [facility]   Office  [phone]

## 2015-02-06 NOTE — Progress Notes (Signed)
Patient refusing bed alarm & safety socks at this time. Patient educated on safety and verbalizes that she will call before she gets oob.

## 2015-02-06 NOTE — Progress Notes (Signed)
[facility]  ROUNDING NOTE   Subjective:  Patient had dialysis performed yesterday as well as left-sided thoracentesis. A total of 1.1 L of clear pleural fluid was removed. Patient reports that her shortness of breath has improved.   Objective:  Vital signs in last 24 hours:  Temp:  [97.5 F (36.4 C)-98.1 F (36.7 C)] 98.1 F (36.7 C) (01/18 1236) Pulse Rate:  [78-93] 93 (01/18 1122) Resp:  [16-20] 20 (01/18 1330) BP: (125-150)/(66-84) 130/77 mmHg (01/18 1330) SpO2:  [93 %-100 %] 99 % (01/18 1233)  Weight change: -8.159 kg (-17 lb 15.8 oz) Filed Weights   02/05/15 0602 02/05/15 0900 02/05/15 1350  Weight: 84 kg (185 lb 3 oz) 75.841 kg (167 lb 3.2 oz) 75.841 kg (167 lb 3.2 oz)    Intake/Output: I/O last 3 completed shifts: In: -  Out: -3000    Intake/Output this shift:  Total I/O In: 240 [P.O.:240] Out: -   Physical Exam: General: no acute distress, laying in bed  Head: Normocephalic, atraumatic. Moist oral mucosal membranes  Eyes: Anicteric  Neck: Supple, trachea midline  Lungs:  Bibasilar rales,normal effort  Heart: S1S2 no rubs  Abdomen:  Soft, nontender, BS present   Extremities: 1+ peripheral edema.  Neurologic: Nonfocal, moving all four extremities  Skin: No lesions  Access: RUE access    Basic Metabolic Panel:  Recent Labs Lab 02/05/15 0611 02/06/15 0525  NA 129* 134*  K 6.1* 4.9  CL 91* 96*  CO2 20* 29  GLUCOSE 82 85  BUN 44* 33*  CREATININE 6.21* 5.02*  CALCIUM 9.3 8.9  PHOS 8.9*  --     Liver Function Tests: No results for input(s): AST, ALT, ALKPHOS, BILITOT, PROT, ALBUMIN in the last 168 hours. No results for input(s): LIPASE, AMYLASE in the last 168 hours. No results for input(s): AMMONIA in the last 168 hours.  CBC:  Recent Labs Lab 02/05/15 0611 02/06/15 0525  WBC 5.6 7.5  NEUTROABS 3.9  --   HGB 8.3* 7.8*  HCT 25.6* 23.9*  MCV 83.5 84.7  PLT 349 217    Cardiac Enzymes:  Recent Labs Lab 02/05/15 0611   TROPONINI 0.07*    BNP: Invalid input(s): POCBNP  CBG:  Recent Labs Lab 02/05/15 0806  GLUCAP 82    Microbiology: Results for orders placed or performed during the hospital encounter of 02/05/15  MRSA PCR Screening     Status: None   Collection Time: 02/05/15 12:00 PM  Result Value Ref Range Status   MRSA by PCR NEGATIVE NEGATIVE Final    Comment:        The GeneXpert MRSA Assay (FDA approved for NASAL specimens only), is one component of a comprehensive MRSA colonization surveillance program. It is not intended to diagnose MRSA infection nor to guide or monitor treatment for MRSA infections.     Coagulation Studies: No results for input(s): LABPROT, INR in the last 72 hours.  Urinalysis: No results for input(s): COLORURINE, LABSPEC, PHURINE, GLUCOSEU, HGBUR, BILIRUBINUR, KETONESUR, PROTEINUR, UROBILINOGEN, NITRITE, LEUKOCYTESUR in the last 72 hours.  Invalid input(s): APPERANCEUR    Imaging: Dg Chest 1 View  02/05/2015  CLINICAL DATA:  Status post 1.1 L left thoracentesis. History of COPD, end-stage renal disease, CHF, shortness of breath EXAM: CHEST 1 VIEW COMPARISON:  02/05/2015 FINDINGS: Near complete resolution of the left effusion following thoracentesis. No pneumothorax. Heart remains enlarged with vascular congestion and residual basilar atelectasis. Aorta is atherosclerotic and tortuous. Trachea midline. IMPRESSION: Near complete resolution of the  left effusion following thoracentesis. No pneumothorax. Electronically Signed   By: Jerilynn Mages.  Shick M.D.   On: 02/05/2015 13:54   Dg Chest 2 View  02/05/2015  CLINICAL DATA:  Dyspnea EXAM: CHEST  2 VIEW COMPARISON:  02/04/2015 FINDINGS: Unchanged cardiopericardial enlargement with marked central vessel enlargement, including the pulmonary arteries. Carina splaying from left atrial enlargement. Diffuse interstitial coarsening with moderate left pleural effusion and underlying atelectasis or consolidation. Streaky  atelectatic type opacity at the right base. IMPRESSION: 1. Unchanged CHF with moderate left pleural effusion. 2. Bibasilar atelectasis with superimposed infection not excluded. 3. Chronic cardiomegaly and pulmonary hypertension. Electronically Signed   By: Monte Fantasia M.D.   On: 02/05/2015 06:56   US Thoracentesis Asp Pleural Space W/img Guide  02/05/2015  CLINICAL DATA:  CHF, left effusion, respiratory difficulty EXAM: ULTRASOUND GUIDED LEFT THORACENTESIS COMPARISON:  02/05/2015 PROCEDURE: An ultrasound guided thoracentesis was thoroughly discussed with the patient and questions answered. The benefits, risks, alternatives and complications were also discussed. The patient understands and wishes to proceed with the procedure. Written consent was obtained. Ultrasound was performed to localize and mark an adequate pocket of fluid in the left chest. The area was then prepped and draped in the normal sterile fashion. 1% Lidocaine was used for local anesthesia. Under ultrasound guidance a safety centesis needle catheter was introduced. Thoracentesis was performed. The catheter was removed and a dressing applied. Complications:  None immediate FINDINGS: A total of approximately 1.1 L of clear pleural fluid was removed. A fluid sample was notsent for laboratory analysis. IMPRESSION: Successful ultrasound guided left thoracentesis yielding 1.1 L of pleural fluid. Electronically Signed   By: Jerilynn Mages.  Shick M.D.   On: 02/05/2015 10:45     Medications:     . albuterol  2.5 mg Nebulization Q4H  . amiodarone  200 mg Oral BID  . amLODipine  5 mg Oral Daily  . calcium acetate  1,334 mg Oral Daily  . calcium carbonate  1 tablet Oral BID PC  . cinacalcet  60 mg Oral Q breakfast  . clopidogrel  75 mg Oral Daily  . ferrous sulfate  325 mg Oral Daily  . furosemide  80 mg Oral Daily  . heparin  5,000 Units Subcutaneous 3 times per day  . hydrALAZINE  25 mg Oral 3 times per day  . isosorbide mononitrate  30 mg Oral  Daily  . lisinopril  2.5 mg Oral Daily  . metoprolol tartrate  12.5 mg Oral BID  . mometasone-formoterol  2 puff Inhalation BID  . multivitamin  1 tablet Oral QHS  . pantoprazole  40 mg Oral Daily  . sevelamer carbonate  800 mg Oral TID WC  . tiotropium  1 capsule Inhalation Daily   sodium chloride, sodium chloride, acetaminophen **OR** acetaminophen, albuterol, alteplase, heparin, hydrALAZINE, ibuprofen, lidocaine (PF), lidocaine-prilocaine, ondansetron **OR** ondansetron (ZOFRAN) IV, oxyCODONE-acetaminophen, pentafluoroprop-tetrafluoroeth, senna-docusate  Assessment/ Plan:  60 y.o. 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  1. End-stage renal disease on hemodialysis Tuesday, Thursday, Saturday.   -  Patient scheduled for extra hemodialysis treatment today as it was a component of pulmonary edema.  We will plan for dialysis again tomorrow as well.  2. Pulmonary edema/left pleural effusion/shortness  of breath. Status post left thoracentesis yielding 1.1 L of pleural fluid 02/05/15. -  Respiratory status improved. We are planning for hemodialysis today for additional ultrafiltration.  3. Anemia of chronic kidney disease. Resume Epogen with dialysis tomorrow.  4. Secondary hyperparathyroidism. Phosphorus quite high at 8.9.  Increase Renvela to 1600 mg by mouth 3 times a day.  5. Hyperkalemia. Potassium down to 4.9 today.  Continue to monitor.    LOS: 1 Kelsey Peterson 1/18/20172:51 PM

## 2015-02-06 NOTE — Progress Notes (Signed)
Report called to [facility]- 2 C RN.  Pts belongings packed up- verified everything was packed with patient.  Pt reports she would communicate room change with her family in the morning- her son is already asleep, he works at [employer].  Will call orderly for transport.  Patient has no questions. Jessee Avers

## 2015-02-06 NOTE — Progress Notes (Signed)
PT Cancellation Note  Patient Details Name: Kelsey Peterson MRN: [id number] DOB: [birth year]   Cancelled Treatment:    Reason Eval/Treat Not Completed: Patient declined, no reason specified. Chart reviewed and RN consulted. Attempted to evaluate pt. Upon arriving at the room pt states, "I don't want no physical therapy!" I explained to the pt that her MD had requested an evaluation to determine her safety upon discharge. Pt repeats, "Let me make it real clear, I don't want you!" Pt refuses PT evaluation. Order completed. Re-order consult if pt demonstrates needs and is agreeable to evaluation.  Lyndel Safe Nellie Pester PT, DPT   Enijah Furr 02/06/2015, 9:04 AM

## 2015-02-07 MED ORDER — SEVELAMER CARBONATE 800 MG PO TABS
2400.0000 mg | ORAL_TABLET | Freq: Three times a day (TID) | ORAL | Status: DC
  Administered 2015-02-08: 2400 mg via ORAL
  Filled 2015-02-07: qty 3

## 2015-02-07 MED ORDER — DIPHENHYDRAMINE HCL 25 MG PO CAPS
25.0000 mg | ORAL_CAPSULE | Freq: Four times a day (QID) | ORAL | Status: DC | PRN
  Administered 2015-02-07: 25 mg via ORAL
  Filled 2015-02-07: qty 1

## 2015-02-07 MED ORDER — EPOETIN ALFA 10000 UNIT/ML IJ SOLN: 10000.0000 [IU] | INTRAMUSCULAR | Status: DC

## 2015-02-07 NOTE — Plan of Care (Signed)
Problem: Health Behavior/Discharge Planning: Goal: Ability to manage health-related needs will improve Outcome: Not Progressing Pt refusing medicines and education about medicines, unhappy with recommended diet regimen

## 2015-02-07 NOTE — Progress Notes (Signed)
02/07/2015 11:37 AM  Pt refusing bed alarm.  Pt is high fall risk.  I explained the rationale for the bed alarm and the risks of falling and injury.  Pt still insisted that she would not use the bed alarm.  I still encouraged the pt to sue her phone or call button before getting out of bed.  Dola Argyle, RN

## 2015-02-07 NOTE — Progress Notes (Signed)
[facility]  ROUNDING NOTE   Subjective:  Patient seen and evaluated during dialysis. Blood flow rate 400. Ultrafiltration target 1.5 kg.   Objective:  Vital signs in last 24 hours:  Temp:  [98.1 F (36.7 C)-98.3 F (36.8 C)] 98.2 F (36.8 C) (01/19 1307) Pulse Rate:  [71-83] 77 (01/19 1330) Resp:  [18-20] 18 (01/19 1330) BP: (105-133)/(58-87) 112/66 mmHg (01/19 1330) SpO2:  [95 %-100 %] 97 % (01/19 1256) Weight:  [63.64 kg (140 lb 4.8 oz)] 63.64 kg (140 lb 4.8 oz) (01/18 1631)  Weight change: -12.202 kg (-26 lb 14.4 oz) Filed Weights   02/05/15 0900 02/05/15 1350 02/06/15 1631  Weight: 75.841 kg (167 lb 3.2 oz) 75.841 kg (167 lb 3.2 oz) 63.64 kg (140 lb 4.8 oz)    Intake/Output: I/O last 3 completed shifts: In: 480 [P.O.:480] Out: -2500    Intake/Output this shift:  Total I/O In: 120 [P.O.:120] Out: 0   Physical Exam: General: no acute distress, laying in bed  Head: Normocephalic, atraumatic. Moist oral mucosal membranes  Eyes: Anicteric  Neck: Supple, trachea midline  Lungs:  Bibasilar rales,normal effort  Heart: S1S2 no rubs  Abdomen:  Soft, nontender, BS present   Extremities: 1+ peripheral edema.  Neurologic: Nonfocal, moving all four extremities  Skin: No lesions  Access: RUE access    Basic Metabolic Panel:  Recent Labs Lab 02/05/15 0611 02/06/15 0525  NA 129* 134*  K 6.1* 4.9  CL 91* 96*  CO2 20* 29  GLUCOSE 82 85  BUN 44* 33*  CREATININE 6.21* 5.02*  CALCIUM 9.3 8.9  PHOS 8.9*  --     Liver Function Tests: No results for input(s): AST, ALT, ALKPHOS, BILITOT, PROT, ALBUMIN in the last 168 hours. No results for input(s): LIPASE, AMYLASE in the last 168 hours. No results for input(s): AMMONIA in the last 168 hours.  CBC:  Recent Labs Lab 02/05/15 0611 02/06/15 0525  WBC 5.6 7.5  NEUTROABS 3.9  --   HGB 8.3* 7.8*  HCT 25.6* 23.9*  MCV 83.5 84.7  PLT 349 217    Cardiac Enzymes:  Recent Labs Lab 02/05/15 0611   TROPONINI 0.07*    BNP: Invalid input(s): POCBNP  CBG:  Recent Labs Lab 02/05/15 0806  GLUCAP 73    Microbiology: Results for orders placed or performed during the hospital encounter of 02/05/15  MRSA PCR Screening     Status: None   Collection Time: 02/05/15 12:00 PM  Result Value Ref Range Status   MRSA by PCR NEGATIVE NEGATIVE Final    Comment:        The GeneXpert MRSA Assay (FDA approved for NASAL specimens only), is one component of a comprehensive MRSA colonization surveillance program. It is not intended to diagnose MRSA infection nor to guide or monitor treatment for MRSA infections.     Coagulation Studies: No results for input(s): LABPROT, INR in the last 72 hours.  Urinalysis: No results for input(s): COLORURINE, LABSPEC, PHURINE, GLUCOSEU, HGBUR, BILIRUBINUR, KETONESUR, PROTEINUR, UROBILINOGEN, NITRITE, LEUKOCYTESUR in the last 72 hours.  Invalid input(s): APPERANCEUR    Imaging: No results found.   Medications:     . amiodarone  200 mg Oral BID  . amLODipine  5 mg Oral Daily  . calcium acetate  1,334 mg Oral Daily  . calcium carbonate  1 tablet Oral BID PC  . cinacalcet  60 mg Oral Q breakfast  . clopidogrel  75 mg Oral Daily  . ferrous sulfate  325 mg  Oral Daily  . furosemide  80 mg Oral Daily  . heparin  5,000 Units Subcutaneous 3 times per day  . hydrALAZINE  25 mg Oral 3 times per day  . isosorbide mononitrate  30 mg Oral Daily  . lisinopril  2.5 mg Oral Daily  . metoprolol tartrate  12.5 mg Oral BID  . mometasone-formoterol  2 puff Inhalation BID  . multivitamin  1 tablet Oral QHS  . pantoprazole  40 mg Oral Daily  . sevelamer carbonate  1,600 mg Oral TID WC  . tiotropium  1 capsule Inhalation Daily   sodium chloride, sodium chloride, acetaminophen **OR** acetaminophen, albuterol, alteplase, diphenhydrAMINE, heparin, hydrALAZINE, ibuprofen, lidocaine (PF), lidocaine-prilocaine, ondansetron **OR** ondansetron (ZOFRAN) IV,  oxyCODONE-acetaminophen, pentafluoroprop-tetrafluoroeth, senna-docusate  Assessment/ Plan:  60 y.o. 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  1. End-stage renal disease on hemodialysis Tuesday, Thursday, Saturday.   -  Pt seen during HD today, tolerating well, BFR 400, UF target 1.5kg today.  2. Pulmonary edema/left pleural effusion/shortness of breath. Status post left thoracentesis yielding 1.1 L of pleural fluid 02/05/15. -  Significantly improved. Continue ultrafiltration with dialysis.  3. Anemia of chronic kidney disease. Start epogen 10000 units IV with HD.   4. Secondary hyperparathyroidism. Renvela increased to 2400mg  po tid/wm, continue to monitor.   5. Hyperkalemia. Potassium improved, down to 4.9.    LOS: 2 Kelsey Peterson 1/19/20172:10 PM

## 2015-02-07 NOTE — Progress Notes (Signed)
Hemodialysis ended by patient 

## 2015-02-07 NOTE — Progress Notes (Signed)
Initial appointment made at the [facility] on February 27, 2015 at 11:00am. Thank you.

## 2015-02-07 NOTE — Progress Notes (Signed)
[place] [facility]                                                                                                                                                                                            Patient Demographics   Kelsey Peterson, is a 60 y.o. female, DOB - 1955/02/18, [id number]  Admit date - 02/05/2015   Admitting Physician Fritzi Mandes, MD  Outpatient Primary MD for the patient is Lavera Guise, MD   LOS - 2  Subjective: Breathing is improving. Denies any chest pains    Review of Systems:    CONSTITUTIONAL: No documented fever. No fatigue, weakness. No weight gain, no weight loss.  EYES: No blurry or double vision.  ENT: No tinnitus. No postnasal drip. No redness of the oropharynx.  RESPIRATORY: No cough, no wheeze, no hemoptysis. No dyspnea.  CARDIOVASCULAR: No chest pain. No orthopnea. No palpitations. No syncope.  GASTROINTESTINAL: No nausea, no vomiting or diarrhea. No abdominal pain. No melena or hematochezia.  GENITOURINARY:  No urgency. No frequency. No dysuria. No hematuria. No obstructive symptoms. No discharge. No pain. No significant abnormal bleeding ENDOCRINE: No polyuria or nocturia. No heat or cold intolerance.  HEMATOLOGY: No anemia. No bruising. No bleeding. No purpura. No petechiae INTEGUMENTARY: No rashes. No lesions.  MUSCULOSKELETAL: No arthritis. No swelling. No gout.  NEUROLOGIC: No numbness, tingling, or ataxia. No seizure-type activity.  PSYCHIATRIC: No anxiety. No insomnia. No ADD.     Vitals:   Filed Vitals:   02/06/15 2051 02/06/15 2157 02/07/15 0537 02/07/15 0605  BP: 108/59 119/72 105/58 110/66  Pulse: 71 77  79  Temp: 98.2 F (36.8 C) 98.1 F (36.7 C)  98.3 F (36.8 C)  TempSrc: Oral Oral  Oral  Resp: 19 20  20   Height:      Weight:      SpO2: 100% 95%  97%    Wt Readings from Last 3 Encounters:  02/06/15 63.64 kg (140 lb 4.8 oz)  12/06/14 86.2 kg (190 lb 0.6 oz)  11/02/14  83.915 kg (185 lb)     Intake/Output Summary (Last 24 hours) at 02/07/15 1253 Last data filed at 02/07/15 0900  Gross per 24 hour  Intake    360 ml  Output  -2500 ml  Net   2860 ml    Physical Exam:   GENERAL: Pleasant-appearing in no apparent distress.  HEAD, EYES, EARS, NOSE AND THROAT: Atraumatic, normocephalic. Extraocular muscles are intact. Pupils equal and reactive to light. Sclerae anicteric. No conjunctival injection. No oro-pharyngeal erythema.  NECK: Supple. There is no jugular venous distention. No  bruits, no lymphadenopathy, no thyromegaly.  HEART: Regular rate and rhythm,. No murmurs, no rubs, no clicks.  LUNGS: Clear to auscultation bilaterally. No rales or rhonchi. No wheezes.  ABDOMEN: Soft, flat, nontender, nondistended. Has good bowel sounds. No hepatosplenomegaly appreciated.  EXTREMITIES: No evidence of any cyanosis, clubbing, or peripheral edema.  +2 pedal and radial pulses bilaterally.  NEUROLOGIC: The patient is alert, awake, and oriented x3 with no focal motor or sensory deficits appreciated bilaterally.  SKIN: Moist and warm with no rashes appreciated.  Psych: Not anxious, depressed LN: No inguinal LN enlargement    Antibiotics   Anti-infectives    None      Medications   Scheduled Meds: . amiodarone  200 mg Oral BID  . amLODipine  5 mg Oral Daily  . calcium acetate  1,334 mg Oral Daily  . calcium carbonate  1 tablet Oral BID PC  . cinacalcet  60 mg Oral Q breakfast  . clopidogrel  75 mg Oral Daily  . ferrous sulfate  325 mg Oral Daily  . furosemide  80 mg Oral Daily  . heparin  5,000 Units Subcutaneous 3 times per day  . hydrALAZINE  25 mg Oral 3 times per day  . isosorbide mononitrate  30 mg Oral Daily  . lisinopril  2.5 mg Oral Daily  . metoprolol tartrate  12.5 mg Oral BID  . mometasone-formoterol  2 puff Inhalation BID  . multivitamin  1 tablet Oral QHS  . pantoprazole  40 mg Oral Daily  . sevelamer carbonate  1,600 mg Oral TID WC   . tiotropium  1 capsule Inhalation Daily   Continuous Infusions:  PRN Meds:.sodium chloride, sodium chloride, acetaminophen **OR** acetaminophen, albuterol, alteplase, diphenhydrAMINE, heparin, hydrALAZINE, ibuprofen, lidocaine (PF), lidocaine-prilocaine, ondansetron **OR** ondansetron (ZOFRAN) IV, oxyCODONE-acetaminophen, pentafluoroprop-tetrafluoroeth, senna-docusate   Data Review:   Micro Results Recent Results (from the past 240 hour(s))  MRSA PCR Screening     Status: None   Collection Time: 02/05/15 12:00 PM  Result Value Ref Range Status   MRSA by PCR NEGATIVE NEGATIVE Final    Comment:        The GeneXpert MRSA Assay (FDA approved for NASAL specimens only), is one component of a comprehensive MRSA colonization surveillance program. It is not intended to diagnose MRSA infection nor to guide or monitor treatment for MRSA infections.     Radiology Reports Dg Chest 1 View  02/05/2015  CLINICAL DATA:  Status post 1.1 L left thoracentesis. History of COPD, end-stage renal disease, CHF, shortness of breath EXAM: CHEST 1 VIEW COMPARISON:  02/05/2015 FINDINGS: Near complete resolution of the left effusion following thoracentesis. No pneumothorax. Heart remains enlarged with vascular congestion and residual basilar atelectasis. Aorta is atherosclerotic and tortuous. Trachea midline. IMPRESSION: Near complete resolution of the left effusion following thoracentesis. No pneumothorax. Electronically Signed   By: Jerilynn Mages.  Shick M.D.   On: 02/05/2015 13:54   Dg Chest 2 View  02/05/2015  CLINICAL DATA:  Dyspnea EXAM: CHEST  2 VIEW COMPARISON:  02/04/2015 FINDINGS: Unchanged cardiopericardial enlargement with marked central vessel enlargement, including the pulmonary arteries. Carina splaying from left atrial enlargement. Diffuse interstitial coarsening with moderate left pleural effusion and underlying atelectasis or consolidation. Streaky atelectatic type opacity at the right base. IMPRESSION:  1. Unchanged CHF with moderate left pleural effusion. 2. Bibasilar atelectasis with superimposed infection not excluded. 3. Chronic cardiomegaly and pulmonary hypertension. Electronically Signed   By: Monte Fantasia M.D.   On: 02/05/2015 06:56   Dg Chest 2  View  02/04/2015  CLINICAL DATA:  Chronic pleural effusion and shortness of breath. EXAM: CHEST  2 VIEW COMPARISON:  01/07/2015 FINDINGS: Moderate left pleural effusion again noted, stable. There is cardiomegaly with vascular congestion interstitial opacities throughout the lungs compatible with edema/ CHF, similar to prior study. Focal left lower lobe atelectasis or infiltrate. No real change since prior study. IMPRESSION: Continued low moderate left pleural effusion with left lower lobe atelectasis or infiltrate. Stable cardiomegaly with moderate CHF. Electronically Signed   By: Rolm Baptise M.D.   On: 02/04/2015 13:46   US Thoracentesis Asp Pleural Space W/img Guide  02/05/2015  CLINICAL DATA:  CHF, left effusion, respiratory difficulty EXAM: ULTRASOUND GUIDED LEFT THORACENTESIS COMPARISON:  02/05/2015 PROCEDURE: An ultrasound guided thoracentesis was thoroughly discussed with the patient and questions answered. The benefits, risks, alternatives and complications were also discussed. The patient understands and wishes to proceed with the procedure. Written consent was obtained. Ultrasound was performed to localize and mark an adequate pocket of fluid in the left chest. The area was then prepped and draped in the normal sterile fashion. 1% Lidocaine was used for local anesthesia. Under ultrasound guidance a safety centesis needle catheter was introduced. Thoracentesis was performed. The catheter was removed and a dressing applied. Complications:  None immediate FINDINGS: A total of approximately 1.1 L of clear pleural fluid was removed. A fluid sample was notsent for laboratory analysis. IMPRESSION: Successful ultrasound guided left thoracentesis yielding  1.1 L of pleural fluid. Electronically Signed   By: Jerilynn Mages.  Shick M.D.   On: 02/05/2015 10:45     CBC  Recent Labs Lab 02/05/15 0611 02/06/15 0525  WBC 5.6 7.5  HGB 8.3* 7.8*  HCT 25.6* 23.9*  PLT 349 217  MCV 83.5 84.7  MCH 27.2 27.7  MCHC 32.5 32.7  RDW 18.2* 18.2*  LYMPHSABS 0.9*  --   MONOABS 0.5  --   EOSABS 0.2  --   BASOSABS 0.1  --     Chemistries   Recent Labs Lab 02/05/15 0611 02/06/15 0525  NA 129* 134*  K 6.1* 4.9  CL 91* 96*  CO2 20* 29  GLUCOSE 82 85  BUN 44* 33*  CREATININE 6.21* 5.02*  CALCIUM 9.3 8.9   ------------------------------------------------------------------------------------------------------------------ estimated creatinine clearance is 10.7 mL/min (by C-G formula based on Cr of 5.02). ------------------------------------------------------------------------------------------------------------------ No results for input(s): HGBA1C in the last 72 hours. ------------------------------------------------------------------------------------------------------------------ No results for input(s): CHOL, HDL, LDLCALC, TRIG, CHOLHDL, LDLDIRECT in the last 72 hours. ------------------------------------------------------------------------------------------------------------------ No results for input(s): TSH, T4TOTAL, T3FREE, THYROIDAB in the last 72 hours.  Invalid input(s): FREET3 ------------------------------------------------------------------------------------------------------------------ No results for input(s): VITAMINB12, FOLATE, FERRITIN, TIBC, IRON, RETICCTPCT in the last 72 hours.  Coagulation profile No results for input(s): INR, PROTIME in the last 168 hours.  No results for input(s): DDIMER in the last 72 hours.  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  Post thoracentesis  * Elevated troponin, possible due to demanding  ischemia:  - No acute MI. Monitor at this time.  * ESRD on hemodialysis: Nephrology managing dialysis -More dialysis today  * Anemia of chronic kidney disease:  -hgb 8.3 -hemoglobin stable. Cont iron pills. - GI w/u in June 2016 and EGD was normal, colonoscopy with polyps and no malignancy  *Malignant HTN-blood pressure control  Cont amlodipine, imdur and lasix   Disposition likely discharge home tomorrow patient refuses physical therapy yesterday and will last him to come reevaluate again     Code Status Orders        Start     Ordered   02/05/15 0918  Full code   Continuous     02/05/15 0917    Code Status History    Date Active Date Inactive Code Status Order ID Comments User Context   12/01/2014  3:08 PM 12/07/2014  7:15 PM Full Code [id number]  Demetrios Loll, MD Inpatient   09/01/2014  5:57 PM 09/06/2014  2:09 PM Full Code [id number]  Demetrios Loll, MD ED   07/14/2014  5:17 AM 07/16/2014  6:40 PM Full Code [id number]  Lance Coon, MD Inpatient   07/03/2014  2:06 PM 07/10/2014  7:11 PM Full Code [id number]  Demetrios Loll, MD Inpatient   06/21/2014  1:35 PM 06/22/2014  4:33 PM Full Code [id number]  Max Sane, MD Inpatient           Consults  nephrology  DVT Prophylaxis  heparin  Lab Results  Component Value Date   PLT 217 02/06/2015     Time Spent in minutes  8min   Beola Vasallo M.D on  02/07/2015 at 12:53 PM  Between 7am to 6pm - Pager - [phone]  After 6pm go to [email or web address] - password [email or web address] [facility]   Office  [phone]

## 2015-02-07 NOTE — Progress Notes (Signed)
Initial Nutrition Assessment  INTERVENTION:   Meals and Snacks: Cater to patient preferences on Renal Diet order and fluid restriction Education: Encouraged pt to follow low sodium, renal diet at home secondary to nutrition recall.   NUTRITION DIAGNOSIS:   Limited adherence to nutrition-related recommendations related to chronic illness as evidenced by per patient/family report.  GOAL:   Patient will meet greater than or equal to 90% of their needs  MONITOR:    (Energy Intake, Electrolyte and Renal Profile, Digsetive system, Anthropometrics)  REASON FOR ASSESSMENT:    (Renal Diet)    ASSESSMENT:   Pt admitted with pulmonary edema with moderate left pleural effusion. Pt with h/o ESRD on HD; s/p thoracentesis with 1.1L removed and dialysis the past 2 days.  Past Medical History  Diagnosis Date  . COPD (chronic obstructive pulmonary disease) ([place])   . ESRD on hemodialysis ([place])   . DM2 (diabetes mellitus, type 2) ([place])   . Hepatitis C   . Migraine with aura   . HOH (hard of hearing)   . Mitral regurgitation     a. echo 06/2014: EF 60%, no WMA, GR1DD, mild AI, calcified mitral annulus, mod MR, no atrial septal defect or patent foramen ovale   . Cognitive impairment   . CAD (coronary artery disease)   . HTN (hypertension)      Diet Order:  Diet renal with fluid restriction Fluid restriction:: 1200 mL Fluid; Room service appropriate?: Yes; Fluid consistency:: Thin    Current Nutrition: Pt reports eating breakfast this am; recorded po intake 80-100% of meals since admission.   Food/Nutrition-Related History: Pt reports eating well PTA with a good appetite. Pt discloses that she eats potato chips often 'because old habits are hard to break.' Pt asking for ham and cheese on a chef salad, mashed potatoes with meatloaf and 20oz of ginger ale for lunch. Pt reports not taking supplement PTA.   Scheduled Medications:  . amiodarone  200 mg Oral BID  . amLODipine  5 mg Oral  Daily  . calcium acetate  1,334 mg Oral Daily  . calcium carbonate  1 tablet Oral BID PC  . cinacalcet  60 mg Oral Q breakfast  . clopidogrel  75 mg Oral Daily  . ferrous sulfate  325 mg Oral Daily  . furosemide  80 mg Oral Daily  . heparin  5,000 Units Subcutaneous 3 times per day  . hydrALAZINE  25 mg Oral 3 times per day  . isosorbide mononitrate  30 mg Oral Daily  . lisinopril  2.5 mg Oral Daily  . metoprolol tartrate  12.5 mg Oral BID  . mometasone-formoterol  2 puff Inhalation BID  . multivitamin  1 tablet Oral QHS  . pantoprazole  40 mg Oral Daily  . sevelamer carbonate  1,600 mg Oral TID WC  . tiotropium  1 capsule Inhalation Daily    Electrolyte/Renal Profile and Glucose Profile:   Recent Labs Lab 02/05/15 0611 02/06/15 0525  NA 129* 134*  K 6.1* 4.9  CL 91* 96*  CO2 20* 29  BUN 44* 33*  CREATININE 6.21* 5.02*  CALCIUM 9.3 8.9  PHOS 8.9*  --   GLUCOSE 82 85   Protein Profile: No results for input(s): ALBUMIN in the last 168 hours.  Gastrointestinal Profile: Last BM: unknown   Nutrition-Focused Physical Exam Findings:  Unable to complete Nutrition-Focused physical exam at this time.    Weight Change: Pt reports some weight loss PTA reports thinking regular weight of 160lbs. RD notes  weight pre-HD on 1/17 of 167lbs, and post-HD on 1/18 140lbs. Of note pt with 1.1L removed per thoracentesis, with 3L removed from HD 1/17 and 2.5L removed 1/18.  Height:   Ht Readings from Last 1 Encounters:  02/05/15 5\' 3"  (1.6 m)    Weight:   Wt Readings from Last 1 Encounters:  02/06/15 140 lb 4.8 oz (63.64 kg)    Wt Readings from Last 10 Encounters:  02/06/15 140 lb 4.8 oz (63.64 kg)  12/06/14 190 lb 0.6 oz (86.2 kg)  11/02/14 185 lb (83.915 kg)  09/04/14 195 lb 15.8 oz (88.9 kg)  07/16/14 206 lb 3.8 oz (93.548 kg)  07/10/14 192 lb 3.9 oz (87.2 kg)  06/22/14 199 lb 12.8 oz (90.629 kg)    BMI:  Body mass index is 24.86 kg/(m^2).  Estimated Nutritional  Needs:   Kcal:  BEE: 1069kcals, TEE: (IF 1.1-1.3)(AF 1.3) 1529-1800kcals  Protein:  64-80g protein (1.2-1.5g/kg)  Fluid:  UOP+1L  EDUCATION NEEDS:   Education needs addressed as RD encouraged compliance.   MODERATE Care Level  Dwyane Luo, [place], [place] Pager [phone] Weekend/On-Call Pager [phone]

## 2015-02-07 NOTE — Progress Notes (Signed)
PT Attempt Note  Patient Details Name: DAJONAE DIMSDALE MRN: [id number] DOB: Aug 21, 1955   Cancelled Treatment:    Reason Eval/Treat Not Completed: Patient declined, no reason specified. New order received. Chart reviewed and RN consulted. Attempted to evaluate pt. Pt repeatedly refuses therapist despite heavy encouragement as well as explanations provided regarding importance of PT evaluation. Eventually pt agrees to participate with PT tomorrow at 10:00AM. [place] that she will not participate this afternoon having just returned from dialysis. Will make one more attempt to evaluate pt tomorrow AM.  Phillips Grout PT, DPT   Lucero Auzenne 02/07/2015, 4:41 PM

## 2015-02-07 NOTE — Progress Notes (Signed)
02/07/2015  11:10  Pt refusing to take her binders other than Phoslo.  She insists that she only takes "the blue and white one" and no other.  Educated the pt that there are many binders used for many purposes, and that she could take Renvela and Sensipar in addition to the Phoslo.  Pt still refused. Dola Argyle, RN

## 2015-02-07 NOTE — Care Management Important Message (Signed)
Important Message  Patient Details  Name: ANMOL DECOOK MRN: [id number] Date of Birth: [birth year]   Medicare Important Message Given:  Yes    [name] A Jadeyn Hargett 02/07/2015, 11:19 AM

## 2015-02-08 NOTE — Discharge Summary (Signed)
Kelsey Peterson, 60 y.o., DOB 1955/12/01, MRN [id number]. Admission date: 02/05/2015 Discharge Date 02/08/2015 Primary MD Lavera Guise, MD Admitting Physician Fritzi Mandes, MD  Admission Diagnosis  Pleural effusion [J90]  Discharge Diagnosis   Active Problems:   Pulmonary edema due to acute diastolic CHF Chronic pleural effusion status post thoracentesis Elevated troponin due to demand ischemia End-stage renal disease hemodialyzed Anemia Malignant hypertension Generalized weakness refused physical therapy valve Tonic respiratory failure        Hospital Course Kelsey Peterson is a 60 y.o. female with a known history of ESRD on hemodialysis, type 2 diabetes, hypertension, history of recurrent admissions for acute pulmonary edema comes to the emergency room with increasing shortness of breath for last week. Patient reports going to all her dialysis sessions. She started having shortness of breath early hours of the morning and came to the emergency room was found to be in pulmonary edema with moderate left pleural effusion. Patient had elevated blood pressure. Patient was admitted to the hospital was seen by nephrology has hemodialysis and fluid removal.  Patient underwent a thoracentesis with 1.1 L of clear pleural fluid removal. Her breathing improved. Patient refused to be seen by physical therapy she was also offered home health and nursing she refused for that as well.          Consults  nephrology  Significant Tests:  See full reports for all details      Dg Chest 1 View  02/05/2015  CLINICAL DATA:  Status post 1.1 L left thoracentesis. History of COPD, end-stage renal disease, CHF, shortness of breath EXAM: CHEST 1 VIEW COMPARISON:  02/05/2015 FINDINGS: Near complete resolution of the left effusion following thoracentesis. No pneumothorax. Heart remains enlarged with vascular congestion and residual basilar atelectasis. Aorta is atherosclerotic and tortuous. Trachea midline.  IMPRESSION: Near complete resolution of the left effusion following thoracentesis. No pneumothorax. Electronically Signed   By: Jerilynn Mages.  Shick M.D.   On: 02/05/2015 13:54   Dg Chest 2 View  02/05/2015  CLINICAL DATA:  Dyspnea EXAM: CHEST  2 VIEW COMPARISON:  02/04/2015 FINDINGS: Unchanged cardiopericardial enlargement with marked central vessel enlargement, including the pulmonary arteries. Carina splaying from left atrial enlargement. Diffuse interstitial coarsening with moderate left pleural effusion and underlying atelectasis or consolidation. Streaky atelectatic type opacity at the right base. IMPRESSION: 1. Unchanged CHF with moderate left pleural effusion. 2. Bibasilar atelectasis with superimposed infection not excluded. 3. Chronic cardiomegaly and pulmonary hypertension. Electronically Signed   By: Monte Fantasia M.D.   On: 02/05/2015 06:56   Dg Chest 2 View  02/04/2015  CLINICAL DATA:  Chronic pleural effusion and shortness of breath. EXAM: CHEST  2 VIEW COMPARISON:  01/07/2015 FINDINGS: Moderate left pleural effusion again noted, stable. There is cardiomegaly with vascular congestion interstitial opacities throughout the lungs compatible with edema/ CHF, similar to prior study. Focal left lower lobe atelectasis or infiltrate. No real change since prior study. IMPRESSION: Continued low moderate left pleural effusion with left lower lobe atelectasis or infiltrate. Stable cardiomegaly with moderate CHF. Electronically Signed   By: Rolm Baptise M.D.   On: 02/04/2015 13:46   US Thoracentesis Asp Pleural Space W/img Guide  02/05/2015  CLINICAL DATA:  CHF, left effusion, respiratory difficulty EXAM: ULTRASOUND GUIDED LEFT THORACENTESIS COMPARISON:  02/05/2015 PROCEDURE: An ultrasound guided thoracentesis was thoroughly discussed with the patient and questions answered. The benefits, risks, alternatives and complications were also discussed. The patient understands and wishes to proceed with the procedure.  Written consent was  obtained. Ultrasound was performed to localize and mark an adequate pocket of fluid in the left chest. The area was then prepped and draped in the normal sterile fashion. 1% Lidocaine was used for local anesthesia. Under ultrasound guidance a safety centesis needle catheter was introduced. Thoracentesis was performed. The catheter was removed and a dressing applied. Complications:  None immediate FINDINGS: A total of approximately 1.1 L of clear pleural fluid was removed. A fluid sample was notsent for laboratory analysis. IMPRESSION: Successful ultrasound guided left thoracentesis yielding 1.1 L of pleural fluid. Electronically Signed   By: Jerilynn Mages.  Shick M.D.   On: 02/05/2015 10:45       Today   Subjective:   Kelsey Peterson  feels better breathing improved wants to go home  Objective:   Blood pressure 111/64, pulse 73, temperature 98 F (36.7 C), temperature source Oral, resp. rate 16, height 5\' 3"  (1.6 m), weight 63.64 kg (140 lb 4.8 oz), SpO2 99 %.  .  Intake/Output Summary (Last 24 hours) at 02/08/15 1345 Last data filed at 02/08/15 0900  Gross per 24 hour  Intake      0 ml  Output   1092 ml  Net  -1092 ml    Exam VITAL SIGNS: Blood pressure 111/64, pulse 73, temperature 98 F (36.7 C), temperature source Oral, resp. rate 16, height 5\' 3"  (1.6 m), weight 63.64 kg (140 lb 4.8 oz), SpO2 99 %.  GENERAL:  60 y.o.-year-old patient lying in the bed with no acute distress.  EYES: Pupils equal, round, reactive to light and accommodation. No scleral icterus. Extraocular muscles intact.  HEENT: Head atraumatic, normocephalic. Oropharynx and nasopharynx clear.  NECK:  Supple, no jugular venous distention. No thyroid enlargement, no tenderness.  LUNGS: Normal breath sounds bilaterally, no wheezing, rales,rhonchi or crepitation. No use of accessory muscles of respiration.  CARDIOVASCULAR: S1, S2 normal. No murmurs, rubs, or gallops.  ABDOMEN: Soft, nontender, nondistended.  Bowel sounds present. No organomegaly or mass.  EXTREMITIES: No pedal edema, cyanosis, or clubbing.  NEUROLOGIC: Cranial nerves II through XII are intact. Muscle strength 5/5 in all extremities. Sensation intact. Gait not checked.  PSYCHIATRIC: The patient is alert and oriented x 3.  SKIN: No obvious rash, lesion, or ulcer.   Data Review     CBC w Diff: Lab Results  Component Value Date   WBC 7.5 02/06/2015   WBC 6.4 02/24/2014   HGB 7.8* 02/06/2015   HGB 7.3* 02/25/2014   HCT 23.9* 02/06/2015   HCT 20.4* 02/24/2014   PLT 217 02/06/2015   PLT 273 02/24/2014   LYMPHOPCT 16 02/05/2015   LYMPHOPCT 13.1 02/24/2014   MONOPCT 9 02/05/2015   MONOPCT 8.6 02/24/2014   MONOPCT 9 01/22/2014   EOSPCT 4 02/05/2015   EOSPCT 1.7 02/24/2014   BASOPCT 2 02/05/2015   BASOPCT 0.6 02/24/2014   CMP: Lab Results  Component Value Date   NA 134* 02/06/2015   NA 140 02/24/2014   K 4.9 02/06/2015   K 5.2* 02/24/2014   CL 96* 02/06/2015   CL 102 02/24/2014   CO2 29 02/06/2015   CO2 33* 02/24/2014   BUN 33* 02/06/2015   BUN 23* 02/24/2014   CREATININE 5.02* 02/06/2015   CREATININE 7.92* 02/24/2014   PROT 6.7 12/04/2014   PROT 6.2* 01/15/2014   ALBUMIN 2.6* 12/04/2014   ALBUMIN 2.4* 02/24/2014   BILITOT 1.4* 12/04/2014   BILITOT 1.3* 01/15/2014   ALKPHOS 55 12/04/2014   ALKPHOS 127* 01/15/2014   AST 23 12/04/2014  AST 33 01/15/2014   ALT 18 12/04/2014   ALT 21 01/15/2014  .  Micro Results Recent Results (from the past 240 hour(s))  MRSA PCR Screening     Status: None   Collection Time: 02/05/15 12:00 PM  Result Value Ref Range Status   MRSA by PCR NEGATIVE NEGATIVE Final    Comment:        The GeneXpert MRSA Assay (FDA approved for NASAL specimens only), is one component of a comprehensive MRSA colonization surveillance program. It is not intended to diagnose MRSA infection nor to guide or monitor treatment for MRSA infections.         Code Status Orders         Start     Ordered   02/05/15 0918  Full code   Continuous     02/05/15 0917    Code Status History    Date Active Date Inactive Code Status Order ID Comments User Context   12/01/2014  3:08 PM 12/07/2014  7:15 PM Full Code [id number]  Demetrios Loll, MD Inpatient   09/01/2014  5:57 PM 09/06/2014  2:09 PM Full Code [id number]  Demetrios Loll, MD ED   07/14/2014  5:17 AM 07/16/2014  6:40 PM Full Code [id number]  Lance Coon, MD Inpatient   07/03/2014  2:06 PM 07/10/2014  7:11 PM Full Code [id number]  Demetrios Loll, MD Inpatient   06/21/2014  1:35 PM 06/22/2014  4:33 PM Full Code [id number]  Max Sane, MD Inpatient          Follow-up Information    Follow up with Alisa Graff, FNP. Go on 02/27/2015.   Specialty:  Family Medicine   Why:  at 11:00am , to the [facility]   Contact information:   [address]  [place] [address] [phone]       Follow up with Lavera Guise, MD. Go on 02/27/2015.   Specialty:  Internal Medicine   Why:  @10am    Contact information:   [place] [address] [phone]       Discharge Medications     Medication List    TAKE these medications        ADVAIR DISKUS 250-50 MCG/DOSE Aepb  Generic drug:  Fluticasone-Salmeterol  Inhale 1 puff into the lungs 2 (two) times daily.     albuterol 108 (90 Base) MCG/ACT inhaler  Commonly known as:  PROVENTIL HFA;VENTOLIN HFA  Inhale 2 puffs into the lungs every 6 (six) hours as needed for wheezing or shortness of breath.     amiodarone 200 MG tablet  Commonly known as:  PACERONE  Take 200 mg by mouth 2 (two) times daily.     amLODipine 5 MG tablet  Commonly known as:  NORVASC  Take 5 mg by mouth daily.     calcium acetate 667 MG capsule  Commonly known as:  PHOSLO  Take 2 capsules by mouth daily.     calcium carbonate 500 MG chewable tablet  Commonly known as:  TUMS - dosed in mg elemental calcium  Chew 1 tablet by mouth 2 (two) times daily after a meal. Take 2 hours  after meal.     cinacalcet 60 MG tablet  Commonly known as:  SENSIPAR  Take 60 mg by mouth daily.     clopidogrel 75 MG tablet  Commonly known as:  PLAVIX  Take 75 mg by mouth daily.     esomeprazole 40 MG capsule  Commonly known as:  NEXIUM  Take 40 mg by mouth daily.     ferrous sulfate 325 (65 FE) MG tablet  Take 325 mg by mouth daily.     folic acid-vitamin b complex-vitamin c-selenium-zinc 3 MG Tabs tablet  Take 1 tablet by mouth daily.     furosemide 80 MG tablet  Commonly known as:  LASIX  Take 80 mg by mouth daily. Patient takes on non dialysis days.     isosorbide mononitrate 30 MG 24 hr tablet  Commonly known as:  IMDUR  Take 30 mg by mouth daily.     metoprolol tartrate 25 MG tablet  Commonly known as:  LOPRESSOR  Take 12.5 mg by mouth 2 (two) times daily.     ondansetron 4 MG tablet  Commonly known as:  ZOFRAN  Take 4 mg by mouth every 8 (eight) hours as needed for nausea or vomiting. Reported on 02/05/2015     SPIRIVA HANDIHALER 18 MCG inhalation capsule  Generic drug:  tiotropium  Place 1 capsule into inhaler and inhale daily.           Total Time in preparing paper work, data evaluation and todays exam - 35 minutes  Dustin Flock M.D on 02/08/2015 at 1:45 PM  [facility]   Office  [phone]

## 2015-02-08 NOTE — Evaluation (Signed)
Physical Therapy Evaluation Patient Details Name: TRENAE TUMLIN MRN: [id number] DOB: [birth year] Today's Date: 02/08/2015   History of Present Illness  Dennielle Levenberg is a 60 y.o. female with a known history of incisional disease on hemodialysis, type 2 diabetes, hypertension, history of recurrent admissions for acute pulmonary edema comes to the emergency room with increasing shortness of breath for last week. Patient reports going to all her dialysis sessions. She started having shortness of breath early hours of the morning and came to the emergency room was found to be in pulmonary edema with moderate left pleural effusion. Patient had elevated blood pressure. Her potassium is 6.1 she is good to be dialyzed urgently. Dr. Holley Raring from nephrology is aware of it  Clinical Impression  Pt is exceedingly rude throughout session and requires an excessive amount of encouragement to participate with physical therapy evaluation. Pt refuses to answer most questions regarding living situation and assistance so most of information obtained from medical record. She does desaturate considerably during ambulation and requires extended time on supplemental O2 for SaO2 to recover to >90%. Pt unequivocally refuses further participation with physical therapy either at home or in the hospital. Order will be completed at this time. It is likely not appropriate or beneficial to have further PT interventions with this patient due to poor motivation and history of non-compliance.    Follow Up Recommendations No PT follow up (Pt refuses further participation with physical therapy)    Equipment Recommendations  None recommended by PT    Recommendations for Other Services       Precautions / Restrictions Precautions Precautions: Fall Restrictions Weight Bearing Restrictions: No      Mobility  Bed Mobility               General bed mobility comments: Received sitting upright at EOB. Left at  EOB.  Transfers Overall transfer level: Needs assistance Equipment used: Rolling walker (2 wheeled) Transfers: Sit to/from Stand Sit to Stand: Modified independent (Device/Increase time)         General transfer comment: Pt able to demonstrate safe stability and strength with sit to stand from EOB  Ambulation/Gait Ambulation/Gait assistance: Supervision Ambulation Distance (Feet): 180 Feet Assistive device: Rolling walker (2 wheeled) Gait Pattern/deviations: Decreased step length - right;Decreased step length - left   Gait velocity interpretation: Below normal speed for age/gender General Gait Details: Pt with overall stable gait with use of rolling walker. SaO2 remains above 90% on 2L/min O2 during first half of ambulation however upon return to bed SaO2 has dropped to 81%. Pt requires 2-3 minutes of seated rest break on 2L/min O2 before SaO2 improves to >90%.  Stairs            Wheelchair Mobility    Modified Rankin (Stroke Patients Only)       Balance Overall balance assessment: Needs assistance   Sitting balance-Leahy Scale: Normal       Standing balance-Leahy Scale: Fair Standing balance comment: Pt will not participate with further balance assessment                             Pertinent Vitals/Pain Pain Assessment:  (Does not answer)    Home Living Family/patient expects to be discharged to:: Private residence Living Arrangements: Alone Available Help at Discharge: Friend(s) Type of Home: Apartment Home Access: Stairs to enter Entrance Stairs-Rails: None Entrance Stairs-Number of Steps: 1+1 Home Layout: One level Home Equipment: Environmental consultant - 4  wheels;Cane - single point Additional Comments: All information obtained from medical record. Pt refuses to answer questions from physical therapist because she states they are "stupid-ass questions that are only my business."     Prior Function Level of Independence: Independent with assistive  device(s)         Comments: Pt uses SPC within home environment, 4WRW within community, ambulates mostly household distances; endorses falls but will not recount how many     Hand Dominance        Extremity/Trunk Assessment   Upper Extremity Assessment: Overall WFL for tasks assessed (Pt unwilling to participate in strength testing)           Lower Extremity Assessment: Overall WFL for tasks assessed (Pt unwilling to participate in strength testing)         Communication   Communication: HOH (Deaf from childhood, does not speak ASL, lip reads)  Cognition Arousal/Alertness: Awake/alert Behavior During Therapy: Agitated Overall Cognitive Status: No family/caregiver present to determine baseline cognitive functioning                      General Comments      Exercises        Assessment/Plan    PT Assessment Patent does not need any further PT services  PT Diagnosis Difficulty walking;Abnormality of gait;Generalized weakness   PT Problem List    PT Treatment Interventions     PT Goals (Current goals can be found in the Care Plan section) Acute Rehab PT Goals Patient Stated Goal: Pt does not want to participate with physical therapy    Frequency     Barriers to discharge        Co-evaluation               End of Session Equipment Utilized During Treatment: Gait belt Activity Tolerance: Treatment limited secondary to agitation Patient left: in bed;with call bell/phone within reach;with bed alarm set Nurse Communication: Mobility status         Time: 1000-1020 PT Time Calculation (min) (ACUTE ONLY): 20 min   Charges:   PT Evaluation $PT Eval Low Complexity: 1 Procedure     PT G Codes:       Lyndel Safe Arrin Ishler PT, DPT   Kiah Vanalstine 02/08/2015, 10:35 AM

## 2015-02-08 NOTE — Care Management (Signed)
Patient declines assessment at this time.  History gathered from previous admissions.  Patient lives at home alone in an apartment.  Patient has home O2 provided by [employer].  Patient uses a rollator walker for ambulation.  PT has worked with patient this admission patient is refusing home health PT services.  Patient would qualify for [place] home health nursing however patient as declined to allow me to set up services.  Patient's son to bring portable tank at time of discharge.  RNCM signing off

## 2015-02-08 NOTE — Discharge Instructions (Signed)
[facility] appointment on February 27, 2015 at 11:00am with Darylene Price, [place]. Please call [phone] to reschedule.  DIET:  Renal diet  DISCHARGE CONDITION:  Stable  ACTIVITY:  Activity as tolerated  OXYGEN:  Home Oxygen: Yes.     Oxygen Delivery: 2 liters/min via Patient connected to nasal cannula oxygen  DISCHARGE LOCATION:  home    ADDITIONAL DISCHARGE INSTRUCTION:   If you experience worsening of your admission symptoms, develop shortness of breath, life threatening emergency, suicidal or homicidal thoughts you must seek medical attention immediately by calling 911 or calling your MD immediately  if symptoms less severe.  You Must read complete instructions/literature along with all the possible adverse reactions/side effects for all the Medicines you take and that have been prescribed to you. Take any new Medicines after you have completely understood and accpet all the possible adverse reactions/side effects.   Please note  You were cared for by a hospitalist during your hospital stay. If you have any questions about your discharge medications or the care you received while you were in the hospital after you are discharged, you can call the unit and asked to speak with the hospitalist on call if the hospitalist that took care of you is not available. Once you are discharged, your primary care physician will handle any further medical issues. Please note that NO REFILLS for any discharge medications will be authorized once you are discharged, as it is imperative that you return to your primary care physician (or establish a relationship with a primary care physician if you do not have one) for your aftercare needs so that they can reassess your need for medications and monitor your lab values.

## 2015-02-08 NOTE — Progress Notes (Signed)
[facility]  ROUNDING NOTE   Subjective:  Patient had HD yesterday. Respiratory status much improved from admission. Working with PT today. Using a walker.    Objective:  Vital signs in last 24 hours:  Temp:  [97.4 F (36.3 C)-98 F (36.7 C)] 98 F (36.7 C) (01/20 0559) Pulse Rate:  [70-81] 73 (01/20 0559) Resp:  [16-21] 16 (01/20 0559) BP: (111-150)/(56-85) 111/64 mmHg (01/20 0559) SpO2:  [92 %-100 %] 99 % (01/20 0559)  Weight change:  Filed Weights   02/05/15 0900 02/05/15 1350 02/06/15 1631  Weight: 75.841 kg (167 lb 3.2 oz) 75.841 kg (167 lb 3.2 oz) 63.64 kg (140 lb 4.8 oz)    Intake/Output: I/O last 3 completed shifts: In: 120 [P.O.:120] Out: 1092 [Other:1092]   Intake/Output this shift:  Total I/O In: 240 [P.O.:240] Out: -   Physical Exam: General: no acute distress, laying in bed  Head: Normocephalic, atraumatic. Moist oral mucosal membranes  Eyes: Anicteric  Neck: Supple, trachea midline  Lungs:  Bibasilar rales,normal effort  Heart: S1S2 no rubs  Abdomen:  Soft, nontender, BS present   Extremities: 1+ peripheral edema.  Neurologic: Nonfocal, moving all four extremities  Skin: No lesions  Access: RUE access    Basic Metabolic Panel:  Recent Labs Lab 02/05/15 0611 02/06/15 0525  NA 129* 134*  K 6.1* 4.9  CL 91* 96*  CO2 20* 29  GLUCOSE 82 85  BUN 44* 33*  CREATININE 6.21* 5.02*  CALCIUM 9.3 8.9  PHOS 8.9*  --     Liver Function Tests: No results for input(s): AST, ALT, ALKPHOS, BILITOT, PROT, ALBUMIN in the last 168 hours. No results for input(s): LIPASE, AMYLASE in the last 168 hours. No results for input(s): AMMONIA in the last 168 hours.  CBC:  Recent Labs Lab 02/05/15 0611 02/06/15 0525  WBC 5.6 7.5  NEUTROABS 3.9  --   HGB 8.3* 7.8*  HCT 25.6* 23.9*  MCV 83.5 84.7  PLT 349 217    Cardiac Enzymes:  Recent Labs Lab 02/05/15 0611  TROPONINI 0.07*    BNP: Invalid input(s): POCBNP  CBG:  Recent  Labs Lab 02/05/15 0806  GLUCAP 90    Microbiology: Results for orders placed or performed during the hospital encounter of 02/05/15  MRSA PCR Screening     Status: None   Collection Time: 02/05/15 12:00 PM  Result Value Ref Range Status   MRSA by PCR NEGATIVE NEGATIVE Final    Comment:        The GeneXpert MRSA Assay (FDA approved for NASAL specimens only), is one component of a comprehensive MRSA colonization surveillance program. It is not intended to diagnose MRSA infection nor to guide or monitor treatment for MRSA infections.     Coagulation Studies: No results for input(s): LABPROT, INR in the last 72 hours.  Urinalysis: No results for input(s): COLORURINE, LABSPEC, PHURINE, GLUCOSEU, HGBUR, BILIRUBINUR, KETONESUR, PROTEINUR, UROBILINOGEN, NITRITE, LEUKOCYTESUR in the last 72 hours.  Invalid input(s): APPERANCEUR    Imaging: No results found.   Medications:     . amiodarone  200 mg Oral BID  . amLODipine  5 mg Oral Daily  . calcium acetate  1,334 mg Oral Daily  . calcium carbonate  1 tablet Oral BID PC  . cinacalcet  60 mg Oral Q breakfast  . clopidogrel  75 mg Oral Daily  . [START ON 02/09/2015] epoetin (EPOGEN/PROCRIT) injection  10,000 Units Intravenous Q T,Th,Sa-HD  . ferrous sulfate  325 mg Oral Daily  .  furosemide  80 mg Oral Daily  . heparin  5,000 Units Subcutaneous 3 times per day  . hydrALAZINE  25 mg Oral 3 times per day  . isosorbide mononitrate  30 mg Oral Daily  . lisinopril  2.5 mg Oral Daily  . metoprolol tartrate  12.5 mg Oral BID  . mometasone-formoterol  2 puff Inhalation BID  . multivitamin  1 tablet Oral QHS  . pantoprazole  40 mg Oral Daily  . sevelamer carbonate  2,400 mg Oral TID WC  . tiotropium  1 capsule Inhalation Daily   sodium chloride, sodium chloride, acetaminophen **OR** acetaminophen, albuterol, alteplase, diphenhydrAMINE, heparin, hydrALAZINE, ibuprofen, lidocaine (PF), lidocaine-prilocaine, ondansetron **OR**  ondansetron (ZOFRAN) IV, oxyCODONE-acetaminophen, pentafluoroprop-tetrafluoroeth, senna-docusate  Assessment/ Plan:  60 y.o. 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  1. End-stage renal disease on hemodialysis Tuesday, Thursday, Saturday.   -  Pt completed HD yesterday, no acute indication for HD today, will resume outpt HD tomorrow.  2. Pulmonary edema/left pleural effusion/shortness of breath. Status post left thoracentesis yielding 1.1 L of pleural fluid 02/05/15. -  Improved with thoracentesis and aggressive UF with HD.   3. Anemia of chronic kidney disease. Continue epogen as outpt.   4. Secondary hyperparathyroidism. Continue renvela as outpt.   5. Hyperkalemia. Resolved with HD.     LOS: 3 Kasch Borquez 1/20/20171:59 PM

## 2015-02-15 NOTE — Addendum Note (Signed)
Encounter addended by: Kathyrn Drown, RN on: 02/15/2015  4:24 PM<BR>     Documentation filed: Charges VN

## 2015-02-27 ENCOUNTER — Other Ambulatory Visit: Payer: Self-pay | Admitting: *Deleted

## 2015-02-27 ENCOUNTER — Ambulatory Visit: Payer: Medicare Other | Admitting: Family

## 2015-02-27 ENCOUNTER — Ambulatory Visit
Admission: RE | Admit: 2015-02-27 | Discharge: 2015-02-27 | Disposition: A | Discharge status: Home / Self Care | Payer: Medicare Other | Source: Ambulatory Visit | Attending: Nephrology | Admitting: Nephrology

## 2015-02-27 VITALS — BP 146/94 | HR 120 | Resp 20 | Ht 63.0 in | Wt 174.0 lb

## 2015-02-27 DIAGNOSIS — R000 Tachycardia, unspecified: Secondary | ICD-10-CM | POA: Diagnosis not present

## 2015-02-27 DIAGNOSIS — N186 End stage renal disease: Secondary | ICD-10-CM | POA: Diagnosis not present

## 2015-02-27 DIAGNOSIS — Z992 Dependence on renal dialysis: Secondary | ICD-10-CM

## 2015-02-27 DIAGNOSIS — I120 Hypertensive chronic kidney disease with stage 5 chronic kidney disease or end stage renal disease: Secondary | ICD-10-CM | POA: Insufficient documentation

## 2015-02-27 DIAGNOSIS — M7989 Other specified soft tissue disorders: Secondary | ICD-10-CM | POA: Diagnosis not present

## 2015-02-27 DIAGNOSIS — I5022 Chronic systolic (congestive) heart failure: Secondary | ICD-10-CM | POA: Insufficient documentation

## 2015-02-27 DIAGNOSIS — I10 Essential (primary) hypertension: Secondary | ICD-10-CM

## 2015-02-27 DIAGNOSIS — Z885 Allergy status to narcotic agent status: Secondary | ICD-10-CM | POA: Diagnosis not present

## 2015-02-27 DIAGNOSIS — J449 Chronic obstructive pulmonary disease, unspecified: Secondary | ICD-10-CM | POA: Insufficient documentation

## 2015-02-27 DIAGNOSIS — J410 Simple chronic bronchitis: Secondary | ICD-10-CM

## 2015-02-27 DIAGNOSIS — R0600 Dyspnea, unspecified: Secondary | ICD-10-CM

## 2015-02-27 LAB — PREPARE RBC (CROSSMATCH)

## 2015-02-27 MED ORDER — IPRATROPIUM-ALBUTEROL 0.5-2.5 (3) MG/3ML IN SOLN: 3.0000 mL | Freq: Four times a day (QID) | RESPIRATORY_TRACT | Status: DC

## 2015-02-27 MED ORDER — SODIUM CHLORIDE 0.9 % IV SOLN: Freq: Once | INTRAVENOUS | Status: DC

## 2015-02-27 NOTE — OR Nursing (Signed)
After infusion of one unit PRBC , pt escorted via w/c to CHF clinic via W/C per Phoebe Sharps RN.

## 2015-02-27 NOTE — Progress Notes (Signed)
Subjective:    Patient ID: Kelsey Peterson, female    DOB: [birth year], 60 y.o.   MRN: [id number]  Congestive Heart Failure Presents for initial visit. The disease course has been stable. Associated symptoms include edema, fatigue and shortness of breath. Pertinent negatives include no abdominal pain, chest pain or palpitations. The symptoms have been stable. Past treatments include beta blockers, oxygen and salt and fluid restriction. The treatment provided moderate relief. Compliance with prior treatments has been good. Her past medical history is significant for CAD, chronic lung disease, DM and HTN. She has one 1st degree relative with heart disease. Compliance with total regimen is 76-100%.  Hypertension This is a chronic problem. The current episode started more than 1 year ago. The problem is unchanged. The problem is controlled. Associated symptoms include headaches (at times), peripheral edema and shortness of breath. Pertinent negatives include no chest pain, neck pain or palpitations. There are no associated agents to hypertension. Risk factors for coronary artery disease include diabetes mellitus, post-menopausal state, sedentary lifestyle and family history. Past treatments include beta blockers, diuretics and calcium channel blockers. The current treatment provides moderate improvement. Compliance problems include exercise.  Hypertensive end-organ damage includes kidney disease and heart failure.    Past Medical History  Diagnosis Date  . COPD (chronic obstructive pulmonary disease) ([place])   . ESRD on hemodialysis ([place])   . DM2 (diabetes mellitus, type 2) ([place])   . Hepatitis C   . Migraine with aura   . HOH (hard of hearing)   . Mitral regurgitation     a. echo 06/2014: EF 60%, no WMA, GR1DD, mild AI, calcified mitral annulus, mod MR, no atrial septal defect or patent foramen ovale   . Cognitive impairment   . CAD (coronary artery disease)   . HTN (hypertension)     Past  Surgical History  Procedure Laterality Date  . Dialysis fistula creation    . Tubal ligation    . Colonoscopy with propofol Left 07/09/2014    Procedure: COLONOSCOPY WITH PROPOFOL;  Surgeon: Hulen Luster, MD;  Location: [facility] ENDOSCOPY;  Service: Endoscopy;  Laterality: Left;  . Colonoscopy N/A 07/15/2014    Procedure: COLONOSCOPY;  Surgeon: Manya Silvas, MD;  Location: [facility] ENDOSCOPY;  Service: Endoscopy;  Laterality: N/A;    Family History  Problem Relation Age of Onset  . Heart disease Mother   . Hypertension Mother     Social History  Substance Use Topics  . Smoking status: Never Smoker   . Smokeless tobacco: Never Used  . Alcohol Use: No    Allergies  Allergen Reactions  . Contrast Media [Iodinated Diagnostic Agents] Other (See Comments)    Unknown reaction  . Morphine And Related Hives  . Other Rash    Blood Pressure Pill (Pt doesn't remember name)     Prior to Admission medications   Medication Sig Start Date End Date Taking? Authorizing Provider  ADVAIR DISKUS 250-50 MCG/DOSE AEPB Inhale 1 puff into the lungs 2 (two) times daily. 02/04/15  Yes Historical Provider, MD  albuterol (PROVENTIL HFA;VENTOLIN HFA) 108 (90 BASE) MCG/ACT inhaler Inhale 2 puffs into the lungs every 6 (six) hours as needed for wheezing or shortness of breath. Patient taking differently: Inhale 2 puffs into the lungs every 4 (four) hours as needed for wheezing or shortness of breath.  09/06/14  Yes [name], MD  amiodarone (PACERONE) 200 MG tablet Take 200 mg by mouth 2 (two) times daily.   Yes Historical Provider,  MD  amLODipine (NORVASC) 5 MG tablet Take 5 mg by mouth daily.   Yes Historical Provider, MD  calcium acetate (PHOSLO) 667 MG capsule Take 2 capsules by mouth daily. 10/05/14  Yes Historical Provider, MD  calcium carbonate (TUMS - DOSED IN MG ELEMENTAL CALCIUM) 500 MG chewable tablet Chew 1 tablet by mouth 2 (two) times daily after a meal. Take 2 hours after meal.   Yes Historical  Provider, MD  cinacalcet (SENSIPAR) 60 MG tablet Take 60 mg by mouth daily.   Yes Historical Provider, MD  clopidogrel (PLAVIX) 75 MG tablet Take 75 mg by mouth daily. 08/31/14  Yes Historical Provider, MD  esomeprazole (NEXIUM) 40 MG capsule Take 40 mg by mouth daily.    Yes Historical Provider, MD  ferrous sulfate 325 (65 FE) MG tablet Take 325 mg by mouth daily.   Yes Historical Provider, MD  folic acid-vitamin b complex-vitamin c-selenium-zinc (DIALYVITE) 3 MG TABS tablet Take 1 tablet by mouth daily.   Yes Historical Provider, MD  furosemide (LASIX) 80 MG tablet Take 80 mg by mouth daily. Patient takes on non dialysis days.   Yes Historical Provider, MD  isosorbide mononitrate (IMDUR) 30 MG 24 hr tablet Take 30 mg by mouth daily.   Yes Historical Provider, MD  ondansetron (ZOFRAN) 4 MG tablet Take 4 mg by mouth every 8 (eight) hours as needed for nausea or vomiting. Reported on 02/05/2015   Yes Historical Provider, MD  SPIRIVA HANDIHALER 18 MCG inhalation capsule Place 1 capsule into inhaler and inhale daily. 02/04/15  Yes Historical Provider, MD  metoprolol tartrate (LOPRESSOR) 25 MG tablet Take 12.5 mg by mouth 2 (two) times daily. 10/21/14   Historical Provider, MD     Review of Systems  Constitutional: Positive for fatigue. Negative for appetite change.  HENT: Positive for hearing loss (extremely hard of hearing). Negative for congestion, postnasal drip and sore throat.   Eyes: Negative.   Respiratory: Positive for shortness of breath. Negative for cough, chest tightness and wheezing.   Cardiovascular: Positive for leg swelling. Negative for chest pain and palpitations.  Gastrointestinal: Negative for abdominal pain and abdominal distention.  Endocrine: Negative.   Genitourinary: Negative.   Musculoskeletal: Negative.  Negative for neck pain.  Skin: Negative.   Allergic/Immunologic: Negative.   Neurological: Positive for light-headedness (sometimes) and headaches (at times). Negative  for dizziness.  Hematological: Negative for adenopathy. Does not bruise/bleed easily.  Psychiatric/Behavioral: Positive for sleep disturbance. Negative for dysphoric mood. The patient is not nervous/anxious.        Objective:   Physical Exam  Constitutional: She is oriented to person, place, and time. She appears well-developed and well-nourished.  HENT:  Head: Normocephalic and atraumatic.  Eyes: Conjunctivae are normal. Pupils are equal, round, and reactive to light.  Neck: Normal range of motion. Neck supple.  Cardiovascular: Regular rhythm.  Tachycardia present.   No murmur heard. Pulmonary/Chest: Effort normal. No respiratory distress. She has no wheezes.  Abdominal: Soft. She exhibits no distension. There is no tenderness.  Musculoskeletal: She exhibits edema (1+ pitting edema in bilateral lower legs). She exhibits no tenderness.  Neurological: She is alert and oriented to person, place, and time.  Skin: Skin is warm and dry.  Psychiatric: She has a normal mood and affect. Her behavior is normal. Thought content normal.  Nursing note and vitals reviewed.   BP 146/94 mmHg  Pulse 120  Resp 20  Ht 5\' 3"  (1.6 m)  Wt 174 lb (78.926 kg)  BMI 30.83  kg/m2  SpO2 97%       Assessment & Plan:  1: Chronic heart failure with reduced ejection fraction- Patient presents with fatigue and shortness of breath with little exertion. She came to the appointment in a wheelchair because she says that she wouldn't have been able to walk this far. Normally gets around using her walker. She does not weigh herself because she doesn't have any scales and we currently don't have any to provide. Encouraged her to try to get some scales so that she can begin weighing herself daily and to call for an overnight weight gain of >2 pounds or a weekly weight gain of >5 pounds. She doesn't add salt to her food and is aware of the high sodium content especially in canned foods. Written information was given to  her about following a 2000mg  sodium diet. She does have some swelling in her legs and tries to elevate them some at home.  2: HTN- Blood pressure mildly elevated but she just came from receiving blood transfusions. Will continue to monitor. 3: Tachycardia- Heart rate in the 100's today but, again, she's been at the hospital for over 5 hours now. Will continue to monitor and adjust medications accordingly.  4: End stage renal disease- She is currently receiving dialysis on T, TH, Sat.  5: COPD- Patient wears oxygen at 2L around the clock and would like a home nebulizer as well as a smaller portable oxygen tank. She says that she's spoken to her PCP about it but hasn't received anything yet. A new patient appointment was scheduled with pulmonology on 04/15/15 at 3:00pm (Dr. Alva Garnet).  Of note, patient is extremely hard of hearing.  Return here in 1 month or sooner for any questions/problems before then.

## 2015-02-27 NOTE — Patient Instructions (Signed)
Begin weighing daily and call for an overnight weight gain of > 2 pounds or a weekly weight gain of >5 pounds. 

## 2015-02-28 ENCOUNTER — Encounter: Payer: Self-pay | Admitting: Family

## 2015-02-28 DIAGNOSIS — R000 Tachycardia, unspecified: Secondary | ICD-10-CM | POA: Insufficient documentation

## 2015-02-28 DIAGNOSIS — I5023 Acute on chronic systolic (congestive) heart failure: Secondary | ICD-10-CM | POA: Insufficient documentation

## 2015-02-28 LAB — TYPE AND SCREEN
ABO/RH(D): O NEG
ANTIBODY SCREEN: NEGATIVE
UNIT DIVISION: 0

## 2015-03-25 ENCOUNTER — Encounter: Payer: Self-pay | Admitting: Family

## 2015-03-25 ENCOUNTER — Ambulatory Visit: Payer: Medicare Other | Attending: Family | Admitting: Family

## 2015-03-25 VITALS — BP 123/85 | HR 85 | Resp 20 | Ht 63.0 in | Wt 176.0 lb

## 2015-03-25 DIAGNOSIS — J410 Simple chronic bronchitis: Secondary | ICD-10-CM

## 2015-03-25 DIAGNOSIS — N186 End stage renal disease: Secondary | ICD-10-CM | POA: Insufficient documentation

## 2015-03-25 DIAGNOSIS — Z992 Dependence on renal dialysis: Secondary | ICD-10-CM | POA: Insufficient documentation

## 2015-03-25 DIAGNOSIS — Z888 Allergy status to other drugs, medicaments and biological substances status: Secondary | ICD-10-CM | POA: Insufficient documentation

## 2015-03-25 DIAGNOSIS — Z9889 Other specified postprocedural states: Secondary | ICD-10-CM | POA: Insufficient documentation

## 2015-03-25 DIAGNOSIS — I509 Heart failure, unspecified: Secondary | ICD-10-CM | POA: Diagnosis present

## 2015-03-25 DIAGNOSIS — B1920 Unspecified viral hepatitis C without hepatic coma: Secondary | ICD-10-CM | POA: Insufficient documentation

## 2015-03-25 DIAGNOSIS — I2510 Atherosclerotic heart disease of native coronary artery without angina pectoris: Secondary | ICD-10-CM | POA: Insufficient documentation

## 2015-03-25 DIAGNOSIS — H9190 Unspecified hearing loss, unspecified ear: Secondary | ICD-10-CM | POA: Diagnosis not present

## 2015-03-25 DIAGNOSIS — H9193 Unspecified hearing loss, bilateral: Secondary | ICD-10-CM

## 2015-03-25 DIAGNOSIS — I120 Hypertensive chronic kidney disease with stage 5 chronic kidney disease or end stage renal disease: Secondary | ICD-10-CM | POA: Insufficient documentation

## 2015-03-25 DIAGNOSIS — Z885 Allergy status to narcotic agent status: Secondary | ICD-10-CM | POA: Diagnosis not present

## 2015-03-25 DIAGNOSIS — E1122 Type 2 diabetes mellitus with diabetic chronic kidney disease: Secondary | ICD-10-CM | POA: Insufficient documentation

## 2015-03-25 DIAGNOSIS — J449 Chronic obstructive pulmonary disease, unspecified: Secondary | ICD-10-CM | POA: Diagnosis not present

## 2015-03-25 DIAGNOSIS — Z79899 Other long term (current) drug therapy: Secondary | ICD-10-CM | POA: Diagnosis not present

## 2015-03-25 DIAGNOSIS — I5022 Chronic systolic (congestive) heart failure: Secondary | ICD-10-CM

## 2015-03-25 NOTE — Progress Notes (Signed)
Subjective:    Patient ID: Kelsey Peterson, female    DOB: 1955-03-15, 60 y.o.   MRN: [id number]  Congestive Heart Failure Presents for follow-up visit. The disease course has been stable. Associated symptoms include fatigue and shortness of breath. Pertinent negatives include no abdominal pain, chest pain, edema, orthopnea or palpitations. The symptoms have been stable. Past treatments include beta blockers, oxygen and salt and fluid restriction. The treatment provided mild relief. Compliance with prior treatments has been good. Prior compliance problems include difficulty understanding directions (due to hearing loss). Her past medical history is significant for CAD, chronic lung disease, DM and HTN. There is no history of CVA. She has one 1st degree relative with heart disease.  Hypertension This is a chronic problem. The current episode started more than 1 year ago. The problem is unchanged. The problem is controlled. Associated symptoms include malaise/fatigue and shortness of breath. Pertinent negatives include no chest pain, headaches, neck pain, palpitations or peripheral edema. There are no associated agents to hypertension. Risk factors for coronary artery disease include diabetes mellitus, family history, post-menopausal state and sedentary lifestyle. Past treatments include calcium channel blockers, beta blockers, diuretics and lifestyle changes. The current treatment provides significant improvement. Compliance problems include exercise.  Hypertensive end-organ damage includes kidney disease and heart failure.  Other This is a chronic (extreme hearing loss) problem. The current episode started more than 1 year ago. The problem occurs constantly. The problem has been unchanged. Associated symptoms include fatigue. Pertinent negatives include no abdominal pain, chest pain, congestion, coughing, headaches, neck pain or sore throat. Nothing aggravates the symptoms. She has tried nothing for the  symptoms.    Past Medical History  Diagnosis Date  . COPD (chronic obstructive pulmonary disease) ([place])   . ESRD on hemodialysis ([place])   . DM2 (diabetes mellitus, type 2) ([place])   . Hepatitis C   . Migraine with aura   . HOH (hard of hearing)   . Mitral regurgitation     a. echo 06/2014: EF 60%, no WMA, GR1DD, mild AI, calcified mitral annulus, mod MR, no atrial septal defect or patent foramen ovale   . Cognitive impairment   . CAD (coronary artery disease)   . HTN (hypertension)     Past Surgical History  Procedure Laterality Date  . Dialysis fistula creation    . Tubal ligation    . Colonoscopy with propofol Left 07/09/2014    Procedure: COLONOSCOPY WITH PROPOFOL;  Surgeon: Hulen Luster, MD;  Location: [facility] ENDOSCOPY;  Service: Endoscopy;  Laterality: Left;  . Colonoscopy N/A 07/15/2014    Procedure: COLONOSCOPY;  Surgeon: Manya Silvas, MD;  Location: [facility] ENDOSCOPY;  Service: Endoscopy;  Laterality: N/A;    Family History  Problem Relation Age of Onset  . Heart disease Mother   . Hypertension Mother     Social History  Substance Use Topics  . Smoking status: Never Smoker   . Smokeless tobacco: Never Used  . Alcohol Use: No    Allergies  Allergen Reactions  . Contrast Media [Iodinated Diagnostic Agents] Other (See Comments)    Unknown reaction  . Morphine And Related Hives  . Other Rash    Blood Pressure Pill (Pt doesn't remember name)     Prior to Admission medications   Medication Sig Start Date End Date Taking? Authorizing Provider  ADVAIR DISKUS 250-50 MCG/DOSE AEPB Inhale 1 puff into the lungs 2 (two) times daily. 02/04/15  Yes Historical Provider, MD  albuterol (PROVENTIL HFA;VENTOLIN  HFA) 108 (90 BASE) MCG/ACT inhaler Inhale 2 puffs into the lungs every 6 (six) hours as needed for wheezing or shortness of breath. Patient taking differently: Inhale 2 puffs into the lungs every 4 (four) hours as needed for wheezing or shortness of breath.  09/06/14  Yes  [name], MD  amiodarone (PACERONE) 200 MG tablet Take 200 mg by mouth 2 (two) times daily.   Yes Historical Provider, MD  amLODipine (NORVASC) 5 MG tablet Take 5 mg by mouth daily.   Yes Historical Provider, MD  calcium acetate (PHOSLO) 667 MG capsule Take 2 capsules by mouth daily. 10/05/14  Yes Historical Provider, MD  calcium carbonate (TUMS - DOSED IN MG ELEMENTAL CALCIUM) 500 MG chewable tablet Chew 1 tablet by mouth 2 (two) times daily after a meal. Take 2 hours after meal.   Yes Historical Provider, MD  cinacalcet (SENSIPAR) 60 MG tablet Take 60 mg by mouth daily.   Yes Historical Provider, MD  clopidogrel (PLAVIX) 75 MG tablet Take 75 mg by mouth daily. 08/31/14  Yes Historical Provider, MD  esomeprazole (NEXIUM) 40 MG capsule Take 40 mg by mouth daily.    Yes Historical Provider, MD  ferrous sulfate 325 (65 FE) MG tablet Take 325 mg by mouth daily.   Yes Historical Provider, MD  folic acid-vitamin b complex-vitamin c-selenium-zinc (DIALYVITE) 3 MG TABS tablet Take 1 tablet by mouth daily.   Yes Historical Provider, MD  furosemide (LASIX) 80 MG tablet Take 80 mg by mouth daily. Patient takes on non dialysis days.   Yes Historical Provider, MD  isosorbide mononitrate (IMDUR) 30 MG 24 hr tablet Take 30 mg by mouth daily.   Yes Historical Provider, MD  metoprolol tartrate (LOPRESSOR) 25 MG tablet Take 12.5 mg by mouth 2 (two) times daily. 10/21/14  Yes Historical Provider, MD  ondansetron (ZOFRAN) 4 MG tablet Take 4 mg by mouth every 8 (eight) hours as needed for nausea or vomiting. Reported on 02/05/2015   Yes Historical Provider, MD  SPIRIVA HANDIHALER 18 MCG inhalation capsule Place 1 capsule into inhaler and inhale daily. 02/04/15  Yes Historical Provider, MD     Review of Systems  Constitutional: Positive for malaise/fatigue and fatigue. Negative for appetite change.  HENT: Positive for hearing loss. Negative for congestion, postnasal drip and sore throat.   Eyes: Negative.    Respiratory: Positive for shortness of breath. Negative for cough and chest tightness.   Cardiovascular: Negative for chest pain, palpitations and leg swelling.  Gastrointestinal: Negative for abdominal pain and abdominal distention.  Endocrine: Negative.   Genitourinary: Negative.   Musculoskeletal: Negative for back pain and neck pain.  Skin: Negative.   Allergic/Immunologic: Negative.   Neurological: Negative for dizziness, light-headedness and headaches.  Hematological: Negative for adenopathy. Does not bruise/bleed easily.  Psychiatric/Behavioral: Positive for sleep disturbance (trouble sleeping last night. wearing oxygen all the time). Negative for dysphoric mood. The patient is not nervous/anxious.        Objective:   Physical Exam  Constitutional: She is oriented to person, place, and time. She appears well-developed and well-nourished.  HENT:  Head: Normocephalic.  Right Ear: Decreased hearing is noted.  Left Ear: Decreased hearing is noted.  Eyes: Conjunctivae are normal. Pupils are equal, round, and reactive to light.  Neck: Normal range of motion. Neck supple.  Cardiovascular: Normal rate and regular rhythm.   Pulmonary/Chest: Effort normal. She has no wheezes. She has no rales.  Abdominal: Soft. She exhibits no distension. There is no tenderness.  Musculoskeletal: She exhibits no edema or tenderness.  Neurological: She is alert and oriented to person, place, and time.  Skin: Skin is warm and dry.  Psychiatric: She has a normal mood and affect. Her behavior is normal. Thought content normal.  Nursing note and vitals reviewed.   BP 123/85 mmHg  Pulse 85  Resp 20  Ht 5\' 3"  (1.6 m)  Wt 176 lb (79.833 kg)  BMI 31.18 kg/m2  SpO2 92%       Assessment & Plan:  1: Chronic heart failure with reduced ejection fraction- Patient presents with fatigue and shortness of breath with minimal exertion. She came into the office in a wheelchair because she says she would have  been too tired to walk the distance. When she does get tired or short of breath, she will stop what she's doing to rest until her symptoms improve. She is weighing herself daily and says that her weight has been stable. By our scale, she's gained 2 pounds in the last month. Reminded to have someone call for an overnight weight gain of >2 pounds or a weekly weight gain of >5 pounds. She is not adding salt to her food. Deferring the use of entresto to her nephrologist. 2: COPD- Patient wears her oxygen at 2L around the clock along with using her inhalers. She has seen a pulmonologist in the past but would really like a second opinion. She currently has an appointment scheduled with Dr. Alva Garnet on 04/15/15. 3: ESRD on dialysis- She's currently getting dialysis on T, TH, Sat.  4: Extreme hearing loss- Patient has great difficulty understanding anything you say even when speaking as loudly as possible. Would probably benefit from having a white board to communicate more readily. She does not know sign language.   Patient opts to return here on an as needed basis. She can have someone call for any questions/problems or if she'd like to schedule an appointment

## 2015-03-26 DIAGNOSIS — H9190 Unspecified hearing loss, unspecified ear: Secondary | ICD-10-CM | POA: Insufficient documentation

## 2015-04-01 ENCOUNTER — Inpatient Hospital Stay
Admission: EM | Admit: 2015-04-01 | Discharge: 2015-04-04 | DRG: 291 | Disposition: A | Discharge status: Home / Self Care | Payer: Medicare Other | Attending: Internal Medicine | Admitting: Internal Medicine

## 2015-04-01 ENCOUNTER — Emergency Department: Payer: Medicare Other

## 2015-04-01 DIAGNOSIS — I501 Left ventricular failure: Secondary | ICD-10-CM | POA: Diagnosis present

## 2015-04-01 DIAGNOSIS — E162 Hypoglycemia, unspecified: Secondary | ICD-10-CM

## 2015-04-01 DIAGNOSIS — Z7902 Long term (current) use of antithrombotics/antiplatelets: Secondary | ICD-10-CM

## 2015-04-01 DIAGNOSIS — I132 Hypertensive heart and chronic kidney disease with heart failure and with stage 5 chronic kidney disease, or end stage renal disease: Secondary | ICD-10-CM | POA: Diagnosis not present

## 2015-04-01 DIAGNOSIS — J449 Chronic obstructive pulmonary disease, unspecified: Secondary | ICD-10-CM | POA: Diagnosis present

## 2015-04-01 DIAGNOSIS — R0902 Hypoxemia: Secondary | ICD-10-CM

## 2015-04-01 DIAGNOSIS — Z9111 Patient's noncompliance with dietary regimen: Secondary | ICD-10-CM

## 2015-04-01 DIAGNOSIS — I248 Other forms of acute ischemic heart disease: Secondary | ICD-10-CM | POA: Diagnosis present

## 2015-04-01 DIAGNOSIS — I10 Essential (primary) hypertension: Secondary | ICD-10-CM | POA: Diagnosis present

## 2015-04-01 DIAGNOSIS — N186 End stage renal disease: Secondary | ICD-10-CM

## 2015-04-01 DIAGNOSIS — I440 Atrioventricular block, first degree: Secondary | ICD-10-CM | POA: Diagnosis present

## 2015-04-01 DIAGNOSIS — Z8661 Personal history of infections of the central nervous system: Secondary | ICD-10-CM

## 2015-04-01 DIAGNOSIS — B1920 Unspecified viral hepatitis C without hepatic coma: Secondary | ICD-10-CM | POA: Diagnosis present

## 2015-04-01 DIAGNOSIS — I2510 Atherosclerotic heart disease of native coronary artery without angina pectoris: Secondary | ICD-10-CM | POA: Diagnosis present

## 2015-04-01 DIAGNOSIS — I5023 Acute on chronic systolic (congestive) heart failure: Secondary | ICD-10-CM | POA: Diagnosis present

## 2015-04-01 DIAGNOSIS — J9601 Acute respiratory failure with hypoxia: Secondary | ICD-10-CM | POA: Diagnosis present

## 2015-04-01 DIAGNOSIS — E875 Hyperkalemia: Secondary | ICD-10-CM | POA: Diagnosis not present

## 2015-04-01 DIAGNOSIS — E1122 Type 2 diabetes mellitus with diabetic chronic kidney disease: Secondary | ICD-10-CM | POA: Diagnosis present

## 2015-04-01 DIAGNOSIS — Z9981 Dependence on supplemental oxygen: Secondary | ICD-10-CM

## 2015-04-01 DIAGNOSIS — I340 Nonrheumatic mitral (valve) insufficiency: Secondary | ICD-10-CM | POA: Diagnosis present

## 2015-04-01 DIAGNOSIS — H9190 Unspecified hearing loss, unspecified ear: Secondary | ICD-10-CM | POA: Diagnosis present

## 2015-04-01 DIAGNOSIS — I4510 Unspecified right bundle-branch block: Secondary | ICD-10-CM | POA: Diagnosis present

## 2015-04-01 DIAGNOSIS — E11649 Type 2 diabetes mellitus with hypoglycemia without coma: Secondary | ICD-10-CM | POA: Diagnosis present

## 2015-04-01 DIAGNOSIS — J90 Pleural effusion, not elsewhere classified: Secondary | ICD-10-CM

## 2015-04-01 DIAGNOSIS — D631 Anemia in chronic kidney disease: Secondary | ICD-10-CM | POA: Diagnosis present

## 2015-04-01 DIAGNOSIS — Z8249 Family history of ischemic heart disease and other diseases of the circulatory system: Secondary | ICD-10-CM

## 2015-04-01 DIAGNOSIS — E785 Hyperlipidemia, unspecified: Secondary | ICD-10-CM | POA: Diagnosis present

## 2015-04-01 DIAGNOSIS — I272 Other secondary pulmonary hypertension: Secondary | ICD-10-CM | POA: Diagnosis present

## 2015-04-01 DIAGNOSIS — Z992 Dependence on renal dialysis: Secondary | ICD-10-CM

## 2015-04-01 DIAGNOSIS — Z79899 Other long term (current) drug therapy: Secondary | ICD-10-CM

## 2015-04-01 DIAGNOSIS — E8770 Fluid overload, unspecified: Secondary | ICD-10-CM | POA: Diagnosis present

## 2015-04-01 DIAGNOSIS — N2581 Secondary hyperparathyroidism of renal origin: Secondary | ICD-10-CM | POA: Diagnosis present

## 2015-04-01 DIAGNOSIS — K219 Gastro-esophageal reflux disease without esophagitis: Secondary | ICD-10-CM | POA: Diagnosis present

## 2015-04-01 DIAGNOSIS — R0600 Dyspnea, unspecified: Secondary | ICD-10-CM

## 2015-04-01 DIAGNOSIS — Z9114 Patient's other noncompliance with medication regimen: Secondary | ICD-10-CM

## 2015-04-01 HISTORY — DX: Chronic systolic (congestive) heart failure: I50.22

## 2015-04-01 HISTORY — DX: Supraventricular tachycardia: I47.1

## 2015-04-01 HISTORY — DX: Supraventricular tachycardia, unspecified: I47.10

## 2015-04-01 MED ORDER — ONDANSETRON HCL 4 MG/2ML IJ SOLN
4.0000 mg | Freq: Once | INTRAMUSCULAR | Status: AC
  Administered 2015-04-01: 4 mg via INTRAVENOUS
  Filled 2015-04-01: qty 2

## 2015-04-01 NOTE — ED Notes (Signed)
Xray at bedside. As well as RT, to place patient on BiPAP.

## 2015-04-01 NOTE — ED Notes (Signed)
RT called to place patient on BiPAP

## 2015-04-01 NOTE — ED Notes (Signed)
Patient called for SOB this evening

## 2015-04-02 ENCOUNTER — Inpatient Hospital Stay (HOSPITAL_COMMUNITY)
Admit: 2015-04-02 | Discharge: 2015-04-02 | Disposition: A | Discharge status: Home / Self Care | Payer: Medicare Other | Attending: Internal Medicine | Admitting: Internal Medicine

## 2015-04-02 ENCOUNTER — Encounter: Payer: Self-pay | Admitting: Student

## 2015-04-02 DIAGNOSIS — Z7902 Long term (current) use of antithrombotics/antiplatelets: Secondary | ICD-10-CM | POA: Diagnosis not present

## 2015-04-02 DIAGNOSIS — D631 Anemia in chronic kidney disease: Secondary | ICD-10-CM | POA: Diagnosis present

## 2015-04-02 DIAGNOSIS — N186 End stage renal disease: Secondary | ICD-10-CM | POA: Diagnosis present

## 2015-04-02 DIAGNOSIS — J9601 Acute respiratory failure with hypoxia: Secondary | ICD-10-CM | POA: Diagnosis present

## 2015-04-02 DIAGNOSIS — I5021 Acute systolic (congestive) heart failure: Secondary | ICD-10-CM

## 2015-04-02 DIAGNOSIS — E875 Hyperkalemia: Secondary | ICD-10-CM | POA: Diagnosis present

## 2015-04-02 DIAGNOSIS — Z9981 Dependence on supplemental oxygen: Secondary | ICD-10-CM | POA: Diagnosis not present

## 2015-04-02 DIAGNOSIS — B1920 Unspecified viral hepatitis C without hepatic coma: Secondary | ICD-10-CM | POA: Diagnosis present

## 2015-04-02 DIAGNOSIS — Z9114 Patient's other noncompliance with medication regimen: Secondary | ICD-10-CM | POA: Diagnosis not present

## 2015-04-02 DIAGNOSIS — I440 Atrioventricular block, first degree: Secondary | ICD-10-CM | POA: Diagnosis present

## 2015-04-02 DIAGNOSIS — H9190 Unspecified hearing loss, unspecified ear: Secondary | ICD-10-CM | POA: Diagnosis present

## 2015-04-02 DIAGNOSIS — I272 Other secondary pulmonary hypertension: Secondary | ICD-10-CM | POA: Diagnosis present

## 2015-04-02 DIAGNOSIS — I5023 Acute on chronic systolic (congestive) heart failure: Secondary | ICD-10-CM | POA: Diagnosis present

## 2015-04-02 DIAGNOSIS — K219 Gastro-esophageal reflux disease without esophagitis: Secondary | ICD-10-CM | POA: Diagnosis present

## 2015-04-02 DIAGNOSIS — Z9111 Patient's noncompliance with dietary regimen: Secondary | ICD-10-CM | POA: Diagnosis not present

## 2015-04-02 DIAGNOSIS — E1122 Type 2 diabetes mellitus with diabetic chronic kidney disease: Secondary | ICD-10-CM | POA: Diagnosis present

## 2015-04-02 DIAGNOSIS — J410 Simple chronic bronchitis: Secondary | ICD-10-CM

## 2015-04-02 DIAGNOSIS — Z992 Dependence on renal dialysis: Secondary | ICD-10-CM | POA: Diagnosis not present

## 2015-04-02 DIAGNOSIS — I4510 Unspecified right bundle-branch block: Secondary | ICD-10-CM | POA: Diagnosis present

## 2015-04-02 DIAGNOSIS — J449 Chronic obstructive pulmonary disease, unspecified: Secondary | ICD-10-CM | POA: Diagnosis present

## 2015-04-02 DIAGNOSIS — I340 Nonrheumatic mitral (valve) insufficiency: Secondary | ICD-10-CM | POA: Diagnosis present

## 2015-04-02 DIAGNOSIS — R0602 Shortness of breath: Secondary | ICD-10-CM | POA: Diagnosis not present

## 2015-04-02 DIAGNOSIS — I132 Hypertensive heart and chronic kidney disease with heart failure and with stage 5 chronic kidney disease, or end stage renal disease: Secondary | ICD-10-CM | POA: Diagnosis present

## 2015-04-02 DIAGNOSIS — I2510 Atherosclerotic heart disease of native coronary artery without angina pectoris: Secondary | ICD-10-CM

## 2015-04-02 DIAGNOSIS — E11649 Type 2 diabetes mellitus with hypoglycemia without coma: Secondary | ICD-10-CM | POA: Diagnosis present

## 2015-04-02 DIAGNOSIS — Z8661 Personal history of infections of the central nervous system: Secondary | ICD-10-CM | POA: Diagnosis not present

## 2015-04-02 DIAGNOSIS — N2581 Secondary hyperparathyroidism of renal origin: Secondary | ICD-10-CM | POA: Diagnosis present

## 2015-04-02 DIAGNOSIS — Z79899 Other long term (current) drug therapy: Secondary | ICD-10-CM | POA: Diagnosis not present

## 2015-04-02 DIAGNOSIS — E8770 Fluid overload, unspecified: Secondary | ICD-10-CM | POA: Diagnosis not present

## 2015-04-02 DIAGNOSIS — E785 Hyperlipidemia, unspecified: Secondary | ICD-10-CM | POA: Diagnosis present

## 2015-04-02 DIAGNOSIS — J90 Pleural effusion, not elsewhere classified: Secondary | ICD-10-CM | POA: Diagnosis present

## 2015-04-02 DIAGNOSIS — I501 Left ventricular failure: Secondary | ICD-10-CM | POA: Diagnosis present

## 2015-04-02 DIAGNOSIS — J948 Other specified pleural conditions: Secondary | ICD-10-CM

## 2015-04-02 DIAGNOSIS — Z8249 Family history of ischemic heart disease and other diseases of the circulatory system: Secondary | ICD-10-CM | POA: Diagnosis not present

## 2015-04-02 DIAGNOSIS — I248 Other forms of acute ischemic heart disease: Secondary | ICD-10-CM | POA: Diagnosis present

## 2015-04-02 LAB — GLUCOSE, CAPILLARY
GLUCOSE-CAPILLARY: 86 mg/dL (ref 65–99)
GLUCOSE-CAPILLARY: 106 mg/dL — AB (ref 65–99)
Glucose-Capillary: 92 mg/dL (ref 65–99)
Glucose-Capillary: 35 mg/dL — CL (ref 65–99)

## 2015-04-02 LAB — BASIC METABOLIC PANEL
Anion gap: 21 — ABNORMAL HIGH (ref 5–15)
BUN: 60 mg/dL — AB (ref 6–20)
CHLORIDE: 95 mmol/L — AB (ref 101–111)
CO2: 21 mmol/L — AB (ref 22–32)
CREATININE: 7.07 mg/dL — AB (ref 0.44–1.00)
Calcium: 9.5 mg/dL (ref 8.9–10.3)
GFR calc Af Amer: 7 mL/min — ABNORMAL LOW (ref >60)
GFR calc non Af Amer: 6 mL/min — ABNORMAL LOW (ref >60)
GLUCOSE: 35 mg/dL — AB (ref 65–99)
Potassium: 5.1 mmol/L (ref 3.5–5.1)
Sodium: 137 mmol/L (ref 135–145)

## 2015-04-02 LAB — CREATININE, SERUM
CREATININE: 7.28 mg/dL — AB (ref 0.44–1.00)
GFR, EST AFRICAN AMERICAN: 6 mL/min — AB (ref >60)
GFR, EST NON AFRICAN AMERICAN: 5 mL/min — AB (ref >60)

## 2015-04-02 LAB — CBC
HEMATOCRIT: 28.8 % — AB (ref 35.0–47.0)
HEMATOCRIT: 25.6 % — AB (ref 35.0–47.0)
Hemoglobin: 9.3 g/dL — ABNORMAL LOW (ref 12.0–16.0)
Hemoglobin: 8.3 g/dL — ABNORMAL LOW (ref 12.0–16.0)
MCH: 25.7 pg — AB (ref 26.0–34.0)
MCH: 26.2 pg (ref 26.0–34.0)
MCHC: 32.3 g/dL (ref 32.0–36.0)
MCHC: 32.6 g/dL (ref 32.0–36.0)
MCV: 79.8 fL — AB (ref 80.0–100.0)
MCV: 80.2 fL (ref 80.0–100.0)
PLATELETS: 300 10*3/uL (ref 150–440)
Platelets: 352 10*3/uL (ref 150–440)
RBC: 3.6 MIL/uL — ABNORMAL LOW (ref 3.80–5.20)
RBC: 3.19 MIL/uL — ABNORMAL LOW (ref 3.80–5.20)
RDW: 20.6 % — AB (ref 11.5–14.5)
RDW: 20.9 % — AB (ref 11.5–14.5)
WBC: 6 10*3/uL (ref 3.6–11.0)
WBC: 6.2 10*3/uL (ref 3.6–11.0)

## 2015-04-02 LAB — COMPREHENSIVE METABOLIC PANEL
ALT: 28 U/L (ref 14–54)
ANION GAP: 26 — AB (ref 5–15)
AST: 54 U/L — ABNORMAL HIGH (ref 15–41)
Albumin: 4.1 g/dL (ref 3.5–5.0)
Alkaline Phosphatase: 101 U/L (ref 38–126)
BILIRUBIN TOTAL: 3.1 mg/dL — AB (ref 0.3–1.2)
BUN: 56 mg/dL — ABNORMAL HIGH (ref 6–20)
CALCIUM: 9.7 mg/dL (ref 8.9–10.3)
CO2: 17 mmol/L — ABNORMAL LOW (ref 22–32)
Chloride: 92 mmol/L — ABNORMAL LOW (ref 101–111)
Creatinine, Ser: 7.17 mg/dL — ABNORMAL HIGH (ref 0.44–1.00)
GFR, EST AFRICAN AMERICAN: 6 mL/min — AB (ref >60)
GFR, EST NON AFRICAN AMERICAN: 6 mL/min — AB (ref >60)
Glucose, Bld: 46 mg/dL — ABNORMAL LOW (ref 65–99)
POTASSIUM: 6 mmol/L — AB (ref 3.5–5.1)
Sodium: 135 mmol/L (ref 135–145)
TOTAL PROTEIN: 8.7 g/dL — AB (ref 6.5–8.1)

## 2015-04-02 LAB — BLOOD GAS, VENOUS
Acid-base deficit: 6.4 mmol/L — ABNORMAL HIGH (ref 0.0–2.0)
BICARBONATE: 19.1 meq/L — AB (ref 21.0–28.0)
PH VEN: 7.32 (ref 7.320–7.430)
Patient temperature: 37
pCO2, Ven: 37 mmHg — ABNORMAL LOW (ref 44.0–60.0)

## 2015-04-02 LAB — TROPONIN I
TROPONIN I: 0.08 ng/mL — AB (ref <0.031)
TROPONIN I: 0.09 ng/mL — AB (ref <0.031)
Troponin I: 0.08 ng/mL — ABNORMAL HIGH (ref <0.031)
Troponin I: 0.07 ng/mL — ABNORMAL HIGH (ref <0.031)

## 2015-04-02 LAB — ECHOCARDIOGRAM COMPLETE
Height: 62 in
Weight: 2843.05 oz

## 2015-04-02 LAB — MAGNESIUM: MAGNESIUM: 2.4 mg/dL (ref 1.7–2.4)

## 2015-04-02 LAB — MRSA PCR SCREENING: MRSA by PCR: NEGATIVE

## 2015-04-02 LAB — PHOSPHORUS: Phosphorus: 8.4 mg/dL — ABNORMAL HIGH (ref 2.5–4.6)

## 2015-04-02 MED ORDER — CALCIUM ACETATE (PHOS BINDER) 667 MG PO CAPS
1334.0000 mg | ORAL_CAPSULE | Freq: Every day | ORAL | Status: DC
  Administered 2015-04-02 – 2015-04-04 (×3): 1334 mg via ORAL
  Filled 2015-04-02 (×3): qty 2

## 2015-04-02 MED ORDER — AMLODIPINE BESYLATE 5 MG PO TABS
5.0000 mg | ORAL_TABLET | Freq: Every day | ORAL | Status: DC
  Administered 2015-04-02 – 2015-04-04 (×3): 5 mg via ORAL
  Filled 2015-04-02 (×4): qty 1

## 2015-04-02 MED ORDER — CLOPIDOGREL BISULFATE 75 MG PO TABS
75.0000 mg | ORAL_TABLET | Freq: Every day | ORAL | Status: DC
  Administered 2015-04-02 – 2015-04-04 (×3): 75 mg via ORAL
  Filled 2015-04-02 (×4): qty 1

## 2015-04-02 MED ORDER — SODIUM BICARBONATE 8.4 % IV SOLN
50.0000 meq | Freq: Once | INTRAVENOUS | Status: AC
  Administered 2015-04-02: 50 meq via INTRAVENOUS
  Filled 2015-04-02: qty 50

## 2015-04-02 MED ORDER — ONDANSETRON HCL 4 MG PO TABS: 4.0000 mg | ORAL_TABLET | Freq: Four times a day (QID) | ORAL | Status: DC | PRN

## 2015-04-02 MED ORDER — ACETAMINOPHEN 325 MG PO TABS: 650.0000 mg | ORAL_TABLET | Freq: Four times a day (QID) | ORAL | Status: DC | PRN

## 2015-04-02 MED ORDER — IPRATROPIUM-ALBUTEROL 0.5-2.5 (3) MG/3ML IN SOLN: 3.0000 mL | RESPIRATORY_TRACT | Status: DC | PRN

## 2015-04-02 MED ORDER — EPOETIN ALFA 10000 UNIT/ML IJ SOLN
10000.0000 [IU] | INTRAMUSCULAR | Status: DC
  Administered 2015-04-04: 10000 [IU] via INTRAVENOUS

## 2015-04-02 MED ORDER — TIOTROPIUM BROMIDE MONOHYDRATE 18 MCG IN CAPS
1.0000 | ORAL_CAPSULE | Freq: Every day | RESPIRATORY_TRACT | Status: DC
  Administered 2015-04-02 – 2015-04-04 (×2): 18 ug via RESPIRATORY_TRACT
  Filled 2015-04-02 (×2): qty 5

## 2015-04-02 MED ORDER — ALBUTEROL SULFATE (2.5 MG/3ML) 0.083% IN NEBU
2.5000 mg | INHALATION_SOLUTION | Freq: Once | RESPIRATORY_TRACT | Status: AC
  Administered 2015-04-02: 2.5 mg via RESPIRATORY_TRACT
  Filled 2015-04-02: qty 3

## 2015-04-02 MED ORDER — AMIODARONE HCL 200 MG PO TABS
200.0000 mg | ORAL_TABLET | Freq: Two times a day (BID) | ORAL | Status: DC
  Administered 2015-04-02: 200 mg via ORAL
  Filled 2015-04-02 (×3): qty 1

## 2015-04-02 MED ORDER — ONDANSETRON HCL 4 MG/2ML IJ SOLN
4.0000 mg | Freq: Four times a day (QID) | INTRAMUSCULAR | Status: DC | PRN
  Filled 2015-04-02: qty 2

## 2015-04-02 MED ORDER — CHLORHEXIDINE GLUCONATE 0.12 % MT SOLN
15.0000 mL | Freq: Two times a day (BID) | OROMUCOSAL | Status: DC
  Administered 2015-04-02 – 2015-04-04 (×2): 15 mL via OROMUCOSAL
  Filled 2015-04-02 (×3): qty 15

## 2015-04-02 MED ORDER — CETYLPYRIDINIUM CHLORIDE 0.05 % MT LIQD: 7.0000 mL | Freq: Two times a day (BID) | OROMUCOSAL | Status: DC

## 2015-04-02 MED ORDER — FERROUS FUMARATE 324 (106 FE) MG PO TABS
1.0000 | ORAL_TABLET | Freq: Every day | ORAL | Status: DC
  Administered 2015-04-03 – 2015-04-04 (×2): 106 mg via ORAL
  Filled 2015-04-02 (×3): qty 1

## 2015-04-02 MED ORDER — FUROSEMIDE 40 MG PO TABS
80.0000 mg | ORAL_TABLET | Freq: Two times a day (BID) | ORAL | Status: DC
  Administered 2015-04-03 – 2015-04-04 (×3): 80 mg via ORAL
  Filled 2015-04-02 (×3): qty 2

## 2015-04-02 MED ORDER — ACETAMINOPHEN 650 MG RE SUPP: 650.0000 mg | Freq: Four times a day (QID) | RECTAL | Status: DC | PRN

## 2015-04-02 MED ORDER — DEXTROSE 50 % IV SOLN
INTRAVENOUS | Status: AC
  Filled 2015-04-02: qty 50

## 2015-04-02 MED ORDER — INSULIN ASPART 100 UNIT/ML ~~LOC~~ SOLN
10.0000 [IU] | Freq: Once | SUBCUTANEOUS | Status: AC
  Administered 2015-04-02: 10 [IU] via INTRAVENOUS
  Filled 2015-04-02: qty 10

## 2015-04-02 MED ORDER — HEPARIN SODIUM (PORCINE) 5000 UNIT/ML IJ SOLN
5000.0000 [IU] | Freq: Three times a day (TID) | INTRAMUSCULAR | Status: DC
  Administered 2015-04-02: 5000 [IU] via SUBCUTANEOUS
  Filled 2015-04-02 (×6): qty 1

## 2015-04-02 MED ORDER — SODIUM CHLORIDE 0.9% FLUSH
3.0000 mL | Freq: Two times a day (BID) | INTRAVENOUS | Status: DC
  Administered 2015-04-02 – 2015-04-04 (×4): 3 mL via INTRAVENOUS

## 2015-04-02 MED ORDER — LIDOCAINE-PRILOCAINE 2.5-2.5 % EX CREA: TOPICAL_CREAM | Freq: Every day | CUTANEOUS | Status: DC | PRN

## 2015-04-02 MED ORDER — DEXTROSE 50 % IV SOLN
50.0000 mL | Freq: Once | INTRAVENOUS | Status: AC
  Administered 2015-04-02: 50 mL via INTRAVENOUS

## 2015-04-02 MED ORDER — MOMETASONE FURO-FORMOTEROL FUM 200-5 MCG/ACT IN AERO
2.0000 | INHALATION_SPRAY | Freq: Two times a day (BID) | RESPIRATORY_TRACT | Status: DC
  Administered 2015-04-02 – 2015-04-04 (×4): 2 via RESPIRATORY_TRACT
  Filled 2015-04-02 (×2): qty 8.8

## 2015-04-02 MED ORDER — LABETALOL HCL 5 MG/ML IV SOLN
10.0000 mg | INTRAVENOUS | Status: DC | PRN
  Administered 2015-04-02: 10 mg via INTRAVENOUS
  Filled 2015-04-02 (×2): qty 4

## 2015-04-02 MED ORDER — CINACALCET HCL 30 MG PO TABS
60.0000 mg | ORAL_TABLET | Freq: Every day | ORAL | Status: DC
  Administered 2015-04-02 – 2015-04-04 (×3): 60 mg via ORAL
  Filled 2015-04-02 (×3): qty 2

## 2015-04-02 MED ORDER — ISOSORBIDE MONONITRATE ER 30 MG PO TB24
30.0000 mg | ORAL_TABLET | Freq: Every day | ORAL | Status: DC
  Administered 2015-04-02 – 2015-04-04 (×3): 30 mg via ORAL
  Filled 2015-04-02 (×4): qty 1

## 2015-04-02 MED ORDER — METOPROLOL TARTRATE 25 MG PO TABS
12.5000 mg | ORAL_TABLET | Freq: Two times a day (BID) | ORAL | Status: DC
  Administered 2015-04-02 – 2015-04-04 (×4): 12.5 mg via ORAL
  Filled 2015-04-02 (×3): qty 1
  Filled 2015-04-02: qty 2
  Filled 2015-04-02 (×2): qty 1

## 2015-04-02 MED ORDER — FUROSEMIDE 80 MG PO TABS
80.0000 mg | ORAL_TABLET | Freq: Every day | ORAL | Status: DC
  Administered 2015-04-02: 80 mg via ORAL
  Filled 2015-04-02: qty 1

## 2015-04-02 MED ORDER — DEXTROSE 50 % IV SOLN
1.0000 | Freq: Once | INTRAVENOUS | Status: AC
  Administered 2015-04-02: 50 mL via INTRAVENOUS
  Filled 2015-04-02: qty 50

## 2015-04-02 MED ORDER — CALCIUM GLUCONATE 10 % IV SOLN
1.0000 g | Freq: Once | INTRAVENOUS | Status: AC
  Administered 2015-04-02: 1 g via INTRAVENOUS
  Filled 2015-04-02: qty 10

## 2015-04-02 MED ORDER — PANTOPRAZOLE SODIUM 40 MG PO TBEC
40.0000 mg | DELAYED_RELEASE_TABLET | Freq: Every day | ORAL | Status: DC
  Administered 2015-04-02 – 2015-04-04 (×3): 40 mg via ORAL
  Filled 2015-04-02 (×4): qty 1

## 2015-04-02 NOTE — Consult Note (Signed)
   [facility] Inpatient Consult   04/02/2015  Kelsey Peterson 1955/03/11 [id number]   Thank you for this consult. Patient is not eligible for [facility] Care Management Services.  Reason:  Not a beneficiary currently attributed to one of the [place].  Membership roster used to verify non- eligible status. Will make inpatient RNCM aware.  Marthenia Rolling, MSN-Ed, RN,BSN [facility] Liaison [phone]

## 2015-04-02 NOTE — Consult Note (Signed)
Cardiology Consult    Patient ID: MCKINZY MINELLI MRN: [id number], DOB/AGE: 02/15/1955   Admit date: 04/01/2015 Date of Consult: 04/02/2015  Primary Physician: Lavera Guise, MD Reason for Consult: CHF Exacerbation Primary Cardiologist: [facility] Requesting Provider: Dr. Jannifer Franklin  History of Present Illness    Kelsey Peterson is a 60 y.o. female with past medical history of CAD (per Care Everywhere), chronic systolic CHF ([id number] by echo in 06/2014), ESRD (on HD - T,Th,Sat), HTN, HLD, Type 2 DM, moderate mitral regurgitation, and Hepatitis C who presented to [facility] on 04/01/2015 for worsening shortness of breath, vomiting, and dizziness.  The patient reports she developed vomiting and diarrhea on 03/31/2015 and her symptoms persisted throughout that night and the following day. She was only able to keep down clear liquids yesterday. Denies any melena, hematochezia, or hematemesis.  She reports having a history of chronic shortness of breath, but says it has been worse over the past several days. Denies any chest pain or palpitations. She does not weight herself daily at home nor does she limit fluid or sodium intake. She had been prescribed Lasix 80mg  daily to take on non-dialysis days but it is unclear if she in complaint with this regimen. She says her breathing has "good and bad days" and she had been prescribed a nebulizer in the past but was unable to afford the medication. Therefore, she notes when her breathing gets "really bad", she has to come to the hospital for breathing treatments.  While in the ED, she was placed on BiPAP due to increased work of breathing and hypoxia in the 80's. She has now been weaned down to 4L [place] and is stating appropriately. Initial labs showed a K+ of 6.0. Creatinine of 7.17. Glucose 46. WBC 6.0. Hgb 9.3. Platelets 352. CXR showed re-accumulated left pleural effusion with pulmonary edema and chornic cardiomegaly. Pulmonary HTN was also noted.  This morning, her  repeat K+ was 5.1 following administration of calcium gluconate, insulin, and dextrose while in the ED. Her glucose was low at 35. Cyclic troponin values have been obtained and are 0.08, 0.09, and 0.08.  She is followed by Cardiology at [facility]. It appears she had a PET Myocardial Perfusion test in 06/2014 which showed no evidence of ischemia.  Past Medical History   Past Medical History  Diagnosis Date  . COPD (chronic obstructive pulmonary disease) ([place])   . ESRD on hemodialysis ([place])   . DM2 (diabetes mellitus, type 2) ([place])   . Hepatitis C   . Migraine with aura   . HOH (hard of hearing)   . Mitral regurgitation     a. echo 06/2014: EF 60%, no WMA, GR1DD, mild AI, calcified mitral annulus, mod MR, no atrial septal defect or patent foramen ovale   . Cognitive impairment   . CAD (coronary artery disease)   . HTN (hypertension)     Past Surgical History  Procedure Laterality Date  . Dialysis fistula creation    . Tubal ligation    . Colonoscopy with propofol Left 07/09/2014    Procedure: COLONOSCOPY WITH PROPOFOL;  Surgeon: Hulen Luster, MD;  Location[facility] ENDOSCOPY;  Service: Endoscopy;  Laterality: Left;  . Colonoscopy N/A 07/15/2014    Procedure: COLONOSCOPY;  Surgeon: Manya Silvas, MD;  Location: [facility] ENDOSCOPY;  Service: Endoscopy;  Laterality: N/A;     Allergies  Allergies  Allergen Reactions  . Contrast Media [Iodinated Diagnostic Agents] Other (See Comments)    Unknown reaction  .  Morphine And Related Hives  . Other Rash    Blood Pressure Pill (Pt doesn't remember name)     Inpatient Medications    . amiodarone  200 mg Oral BID  . amLODipine  5 mg Oral Daily  . antiseptic oral rinse  7 mL Mouth Rinse q12n4p  . calcium acetate  1,334 mg Oral Q breakfast  . chlorhexidine  15 mL Mouth Rinse BID  . cinacalcet  60 mg Oral Q breakfast  . clopidogrel  75 mg Oral Daily  . dextrose      . epoetin (EPOGEN/PROCRIT) injection  10,000 Units Intravenous Q T,Th,Sa-HD  .  Ferrous Fumarate  1 tablet Oral Daily  . furosemide  80 mg Oral BID  . heparin  5,000 Units Subcutaneous 3 times per day  . isosorbide mononitrate  30 mg Oral Daily  . metoprolol tartrate  12.5 mg Oral BID  . mometasone-formoterol  2 puff Inhalation BID  . pantoprazole  40 mg Oral Daily  . sodium chloride flush  3 mL Intravenous Q12H  . tiotropium  1 capsule Inhalation Daily    Family History    Family History  Problem Relation Age of Onset  . Heart disease Mother   . Hypertension Mother     Social History    Social History   Social History  . Marital Status: Single    Spouse Name: N/A  . Number of Children: 2  . Years of Education: N/A   Occupational History  . retired [employer]  . Smoking status: Never Smoker   . Smokeless tobacco: Never Used  . Alcohol Use: No  . Drug Use: No  . Sexual Activity: Not on file   Other Topics Concern  . Not on file   Social History Narrative   Lives alone     Review of Systems    General:  No chills, fever, night sweats or weight changes.  Cardiovascular:  No chest pain, dyspnea on exertion, orthopnea, palpitations, paroxysmal nocturnal dyspnea. Positive for edema. Dermatological: No rash, lesions/masses Respiratory: No cough, Positive for dyspnea. Urologic: No hematuria, dysuria Abdominal:   No bright red blood per rectum, melena, or hematemesis. Positive for nausea, vomiting, and diarrhea.  Neurologic:  No visual changes, wkns, changes in mental status. All other systems reviewed and are otherwise negative except as noted above.  Physical Exam    Blood pressure 125/50, pulse 74, temperature 97.5 F (36.4 C), temperature source Axillary, resp. rate 20, height 5\' 2"  (1.575 m), weight 177 lb 11.1 oz (80.6 kg), SpO2 95 %.  General: Pleasant, African American female appearing in NAD. Hard of hearing. Psych: Normal affect. Neuro: Alert and oriented X 3. Moves all extremities  spontaneously. HEENT: Normal  Neck: Supple without bruits or JVD. Lungs:  Resp regular and unlabored, rales at bases bilaterally with expiratory wheeze noted. Heart: RRR no s3, s4, 2/6 SEM best appreciated at apex. Abdomen: Soft, non-tender, non-distended, BS + x 4.  Extremities: No clubbing or cyanosis. Trace lower extremity edema bilaterally, more prominent on the left. DP/PT/Radials 1+ and equal bilaterally.  Labs    Troponin (Point of Care Test) No results for input(s): TROPIPOC in the last 72 hours.  Recent Labs  04/01/15 2303 04/02/15 0338 04/02/15 0911  TROPONINI 0.08* 0.09* 0.08*   Lab Results  Component Value Date   WBC 6.2 04/02/2015   HGB 8.3* 04/02/2015   HCT 25.6* 04/02/2015   MCV 80.2 04/02/2015  PLT 300 04/02/2015    Recent Labs Lab 04/01/15 2303 04/02/15 0338  NA 135 137  K 6.0* 5.1  CL 92* 95*  CO2 17* 21*  BUN 56* 60*  CREATININE 7.17* 7.28*  7.07*  CALCIUM 9.7 9.5  PROT 8.7*  --   BILITOT 3.1*  --   ALKPHOS 101  --   ALT 28  --   AST 54*  --   GLUCOSE 46* 35*     Radiology Studies    Dg Chest Portable 1 View: 04/02/2015  CLINICAL DATA:  Shortness of breath EXAM: PORTABLE CHEST 1 VIEW COMPARISON:  02/05/2015 FINDINGS: Chronic cardiopericardial enlargement and vascular pedicle widening. The aorta is tortuous. Pulmonary arteries are enlarged, as confirmed on 2016 chest CT. There is a recurrent moderate left pleural effusion. Diffuse interstitial coarsening. IMPRESSION: 1. Re- accumulated left pleural effusion, moderate volume. 2. Pulmonary edema and chronic cardiomegaly. 3. Pulmonary hypertension. Electronically Signed   By: Monte Fantasia M.D.   On: 04/02/2015 00:05    EKG & Cardiac Imaging    EKG: None on file this admission.  Echocardiogram: 06/2014 Study Conclusions - Left ventricle: Systolic function was mildly reduced. The  estimated ejection fraction was 45%. Regional wall motion  abnormalities cannot be excluded. Doppler  parameters are  consistent with abnormal left ventricular relaxation (grade 1  diastolic dysfunction). - Ventricular septum: Septal motion showed abnormal function and  dyssynergy. - Aortic valve: There was trivial regurgitation. Valve area (Vmax):  4.29 cm^2. - Mitral valve: There was moderate regurgitation. - Left atrium: The atrium was mildly dilated. - Right ventricle: The cavity size was mildly dilated. - Right atrium: The atrium was mildly dilated. - Atrial septum: No defect or patent foramen ovale was identified. - Tricuspid valve: There was severe regurgitation. - Pericardium, extracardiac: A trivial pericardial effusion was  identified. There was a left pleural effusion.  Impressions: - There was no evidence of a vegetation. There is no vegetations  seen on mitral,tricuspid, aortic or pulmonic valves. But if blood  cultures are positive consider TEE. There is left pleural  effusion and septal hypokinesis suggestive of CAD.  Assessment & Plan    1. Acute Hypoxic Respiratory Failure - occurred in the setting of worsening vomiting and diarrhea for the past two days. - reports having a history of "chronic shortness of breath", but says it has been worse over the past several days.  - initially required BiPAP due to hypoxia in the 80's. Now on 4L [place] and stating appropriately. - reports significant improvement in her respiratory symptoms at home with Nebulizer treatments, however she has been unable to afford those. She also has portable O2 at home but reports trouble carrying the tanks, due to their weight. Would benefit from Case Management in regards to financial assistance programs for her nebulizer treatments and possible alternatives to her current oxygen tank to improve compliance. - Fluid removal per nephrology.   2. Acute on Chronic Systolic CHF - EF [id number] by echo in 06/2014. Repeat echo is pending to further assess LV function. - the patient was not compliant with  her medication regimen of taking Lasix 80mg  daily on non-HD days. This was reviewed with her today. The importance of fluid and sodium restriction was also reviewed. She is unaware of her baseline weight. - obtain daily weights with strict I&O's. - continue BB. Fluid removal per Nephrology. For HD later today.   3. Moderate Mitral Regurgitation - noted on echo in 06/2014. - repeat echocardiogram is  pending.  4. Left Pleural Effusion - CXR this admission shows re-accumulated left pleural effusion.  - continue to monitor.  5. Elevated Troponin/ History of CAD - cyclic troponin values have been 0.08, 0.09, and 0.08.  - had recent PET Myocardial Perfusion in 06/2014 which was performed at [facility] and showed no evidence of ischemia. - likely due to demand ischemia in the setting of acute hypoxic respiratory failure, volume overload, and ESRD. - would not pursue further inpatient ischemic evaluation at that time. - continue Plavix, Imdur, and BB.   6. History of SVT - no known recurrence this admission. - continue Amiodarone and BB. Consider decreasing Amiodarone from 200mg  BID to 200mg  daily, for she has been on this dose since at least 06/2014.  7. ESRD - HD Tuesday, Thursday, Saturday schedule. - Nephrology following.  8. Hyperkalemia - K+ 6.0 on admission. Improved to 5.1 on 04/02/2015. - continue to monitor.   Signed, Erma Heritage, PA-C 04/02/2015, 1:20 PM Pager: [phone]

## 2015-04-02 NOTE — Progress Notes (Signed)
Subjective:  Patient known to our practice from outpatient dialysis She presents from home for SOB Her son is relating the information. He reports that patient is constantly eating ice chips He is not sure if she takes lasix Last HD was Saturday 3/11 She is 3 kg over  Her EDW    Objective:  Vital signs in last 24 hours:  Temp:  [97.4 F (36.3 C)-97.5 F (36.4 C)] 97.5 F (36.4 C) (03/14 0400) Pulse Rate:  [76-101] 79 (03/14 0600) Resp:  [17-40] 25 (03/14 0600) BP: (153-193)/(76-115) 164/76 mmHg (03/14 0600) SpO2:  [89 %-100 %] 90 % (03/14 0600) Weight:  [72.576 kg (160 lb)-80.6 kg (177 lb 11.1 oz)] 80.6 kg (177 lb 11.1 oz) (03/14 0443)  Weight change:  Filed Weights   04/01/15 2235 04/02/15 0443  Weight: 72.576 kg (160 lb) 80.6 kg (177 lb 11.1 oz)    Intake/Output:   No intake or output data in the 24 hours ending 04/02/15 1008   Physical Exam: General: NAD  HEENT Decreased hearing  Neck supple  Pulm/lungs Crackles at bases, normal effort, [place] oxygen  CVS/Heart Regular, no rub  Abdomen:  Soft, NT  Extremities: + dependent edema  Neurologic: Alert, follows commands  Skin: Warm, dry  Access: AVF       Basic Metabolic Panel:   Recent Labs Lab 04/01/15 2303 04/02/15 0338  NA 135 137  K 6.0* 5.1  CL 92* 95*  CO2 17* 21*  GLUCOSE 46* 35*  BUN 56* 60*  CREATININE 7.17* 7.28*  7.07*  CALCIUM 9.7 9.5     CBC:  Recent Labs Lab 04/01/15 2303 04/02/15 0338  WBC 6.0 6.2  HGB 9.3* 8.3*  HCT 28.8* 25.6*  MCV 79.8* 80.2  PLT 352 300      Microbiology:  Recent Results (from the past 720 hour(s))  MRSA PCR Screening     Status: None   Collection Time: 04/02/15  3:32 AM  Result Value Ref Range Status   MRSA by PCR NEGATIVE NEGATIVE Final    Comment:        The GeneXpert MRSA Assay (FDA approved for NASAL specimens only), is one component of a comprehensive MRSA colonization surveillance program. It is not intended to diagnose MRSA infection  nor to guide or monitor treatment for MRSA infections.     Coagulation Studies: No results for input(s): LABPROT, INR in the last 72 hours.  Urinalysis: No results for input(s): COLORURINE, LABSPEC, PHURINE, GLUCOSEU, HGBUR, BILIRUBINUR, KETONESUR, PROTEINUR, UROBILINOGEN, NITRITE, LEUKOCYTESUR in the last 72 hours.  Invalid input(s): APPERANCEUR    Imaging: Dg Chest Portable 1 View  04/02/2015  CLINICAL DATA:  Shortness of breath EXAM: PORTABLE CHEST 1 VIEW COMPARISON:  02/05/2015 FINDINGS: Chronic cardiopericardial enlargement and vascular pedicle widening. The aorta is tortuous. Pulmonary arteries are enlarged, as confirmed on 2016 chest CT. There is a recurrent moderate left pleural effusion. Diffuse interstitial coarsening. IMPRESSION: 1. Re- accumulated left pleural effusion, moderate volume. 2. Pulmonary edema and chronic cardiomegaly. 3. Pulmonary hypertension. Electronically Signed   By: Monte Fantasia M.D.   On: 04/02/2015 00:05     Medications:     . amiodarone  200 mg Oral BID  . amLODipine  5 mg Oral Daily  . antiseptic oral rinse  7 mL Mouth Rinse q12n4p  . calcium acetate  1,334 mg Oral Q breakfast  . chlorhexidine  15 mL Mouth Rinse BID  . cinacalcet  60 mg Oral Q breakfast  . clopidogrel  75 mg  Oral Daily  . dextrose      . furosemide  80 mg Oral Daily  . heparin  5,000 Units Subcutaneous 3 times per day  . isosorbide mononitrate  30 mg Oral Daily  . metoprolol tartrate  12.5 mg Oral BID  . mometasone-formoterol  2 puff Inhalation BID  . pantoprazole  40 mg Oral Daily  . sodium chloride flush  3 mL Intravenous Q12H  . tiotropium  1 capsule Inhalation Daily   acetaminophen **OR** acetaminophen, ipratropium-albuterol, labetalol, ondansetron **OR** ondansetron (ZOFRAN) IV  Assessment/ Plan:  60 y.o. female with hypertension, anemia of chronic kidney disease, ESRD on HD TTHS, secondary hyperparathyroidism, hx of meningitis as a child with resultant hearing  loss, hx of lytic lesions in ileum followed by oncology, h/o MSSA bacteremia, colonoscopy 06/2014- polyps removed, strep viridans bacteremia 11/16, s/p removal of CVC, strep viridans bacteremia, admission for left pleural effursion 02/05/15.   1. End-stage renal disease on hemodialysis Tuesday, Thursday, Saturday.  - will schedule HD for later today  2. Pulmonary edema/left pleural effusion/shortness of breath. Status post left thoracentesis yielding 1.1 L of pleural fluid 02/05/15. -  Continue ultrafiltration with dialysis as tolerated -  3 kg above EDW -  Continue furosemide. Increase to twice daily on non dialysis days  3. Anemia of chronic kidney disease.  Start epogen 10000 units IV with HD.   4. Secondary hyperparathyroidism.   continue to monitor.  PhosLo     LOS: 0 Humphrey Guerreiro 3/14/201710:08 AM

## 2015-04-02 NOTE — ED Provider Notes (Signed)
[facility] Emergency Department Provider Note  ____________________________________________  Time seen: Approximately 2322 PM  I have reviewed the triage vital signs and the nursing notes.   HISTORY  Chief Complaint Shortness of Breath  Patient with some respiratory distress  HPI Kelsey Peterson is a 60 y.o. female who comes into the hospital today with some shortness of breath. The patient is also had some dizzy spells and vomiting. She reports that this is been going on all day. She is scheduled for dialysis tomorrow and receives dialysis on Tuesday Thursday Saturday. She's had some cough but only when she vomits. The patient denies any fever and pain but has had some diarrhea. She has vomited many times today as well as having diarrhea all day. She stays by herself and denies any known sick contacts. The patient reports that she was too short of breath and she could not stay home tonight. The patient reports that she is out of her inhalers at home so she did not use her inhalers for the shortness of breath at home. She reports that she did receive dialysis on Saturday without any problems.   Past Medical History  Diagnosis Date  . COPD (chronic obstructive pulmonary disease) ([place])   . ESRD on hemodialysis ([place])   . DM2 (diabetes mellitus, type 2) ([place])   . Hepatitis C   . Migraine with aura   . HOH (hard of hearing)   . Mitral regurgitation     a. echo 06/2014: EF 60%, no WMA, GR1DD, mild AI, calcified mitral annulus, mod MR, no atrial septal defect or patent foramen ovale   . Cognitive impairment   . CAD (coronary artery disease)   . HTN (hypertension)     Patient Active Problem List   Diagnosis Date Noted  . Acute respiratory failure with hypoxia ([place]) 04/02/2015  . Pleural effusion, left 04/02/2015  . Extreme hearing loss 03/26/2015  . Acute on chronic systolic heart failure ([place]) 02/28/2015  . Cholelithiasis 12/01/2014  . Hyperkalemia  09/01/2014  . Fluid overload 09/01/2014  . GERD (gastroesophageal reflux disease) 07/14/2014  . COPD (chronic obstructive pulmonary disease) ([place]) 07/14/2014  . HTN (hypertension) 07/14/2014  . CAD (coronary artery disease) 07/14/2014  . End stage renal disease on dialysis ([place])   . Supraventricular tachycardia ([place])   . NSVT (nonsustained ventricular tachycardia) ([place])   . Anemia 07/03/2014  . Diarrhea     Past Surgical History  Procedure Laterality Date  . Dialysis fistula creation    . Tubal ligation    . Colonoscopy with propofol Left 07/09/2014    Procedure: COLONOSCOPY WITH PROPOFOL;  Surgeon: Hulen Luster, MD;  Location: [facility] ENDOSCOPY;  Service: Endoscopy;  Laterality: Left;  . Colonoscopy N/A 07/15/2014    Procedure: COLONOSCOPY;  Surgeon: Manya Silvas, MD;  Location: [facility] ENDOSCOPY;  Service: Endoscopy;  Laterality: N/A;    No current outpatient prescriptions on file.  Allergies Contrast media; Morphine and related; and Other  Family History  Problem Relation Age of Onset  . Heart disease Mother   . Hypertension Mother     Social History Social History  Substance Use Topics  . Smoking status: Never Smoker   . Smokeless tobacco: Never Used  . Alcohol Use: No    Review of Systems Constitutional: No fever/chills Eyes: No visual changes. ENT: No sore throat. Cardiovascular: Denies chest pain. Respiratory: shortness of breath. Gastrointestinal: Vomiting and diarrhea with no abdominal pain Genitourinary: Negative for dysuria. Musculoskeletal: Negative for back  pain. Skin: Negative for rash. Neurological: Dizziness  10-point ROS otherwise negative.  ____________________________________________   PHYSICAL EXAM:  VITAL SIGNS: ED Triage Vitals  Enc Vitals Group     BP 04/01/15 2235 174/113 mmHg     Pulse Rate 04/01/15 2235 99     Resp 04/01/15 2235 24     Temp 04/01/15 2235 97.4 F (36.3 C)     Temp Source 04/01/15 2235 Oral     SpO2 04/01/15 2234  100 %     Weight 04/01/15 2235 160 lb (72.576 kg)     Height 04/01/15 2235 5\' 2"  (1.575 m)     Head Cir --      Peak Flow --      Pain Score 04/01/15 2237 0     Pain Loc --      Pain Edu? --      Excl. in [place]? --     Constitutional: Alert and oriented. Well appearing and in moderate distress. Eyes: Conjunctivae are normal. PERRL. EOMI. Head: Atraumatic. Nose: No congestion/rhinnorhea. Mouth/Throat: Mucous membranes are moist.  Oropharynx non-erythematous. Cardiovascular: Normal rate, regular rhythm. Grossly normal heart sounds.  Good peripheral circulation. Respiratory: Increased respiratory effort.  No retractions. Crackles in the left base Gastrointestinal: Soft and nontender. No distention. Positive bowel sounds Musculoskeletal: No lower extremity tenderness nor edema.   Neurologic:  Normal speech and language.  Skin:  Skin is warm, dry and intact. Marland Kitchen Psychiatric: Mood and affect are normal.   ____________________________________________   LABS (all labs ordered are listed, but only abnormal results are displayed)  Labs Reviewed  CBC - Abnormal; Notable for the following:    RBC 3.60 (*)    Hemoglobin 9.3 (*)    HCT 28.8 (*)    MCV 79.8 (*)    MCH 25.7 (*)    RDW 20.6 (*)    All other components within normal limits  COMPREHENSIVE METABOLIC PANEL - Abnormal; Notable for the following:    Potassium 6.0 (*)    Chloride 92 (*)    CO2 17 (*)    Glucose, Bld 46 (*)    BUN 56 (*)    Creatinine, Ser 7.17 (*)    Total Protein 8.7 (*)    AST 54 (*)    Total Bilirubin 3.1 (*)    GFR calc non Af Amer 6 (*)    GFR calc Af Amer 6 (*)    Anion gap 26 (*)    All other components within normal limits  TROPONIN I - Abnormal; Notable for the following:    Troponin I 0.08 (*)    All other components within normal limits  BLOOD GAS, VENOUS - Abnormal; Notable for the following:    pCO2, Ven 37 (*)    Bicarbonate 19.1 (*)    Acid-base deficit 6.4 (*)    All other components  within normal limits  GLUCOSE, CAPILLARY - Abnormal; Notable for the following:    Glucose-Capillary 35 (*)    All other components within normal limits  MRSA PCR SCREENING  GLUCOSE, CAPILLARY  GLUCOSE, CAPILLARY  CBC  CREATININE, SERUM  TROPONIN I  TROPONIN I  TROPONIN I  BASIC METABOLIC PANEL  CBC  HEPATITIS B SURFACE ANTIGEN  CBG MONITORING, ED  CBG MONITORING, ED  CBG MONITORING, ED   ____________________________________________  EKG  ED ECG REPORT I, Loney Hering, the attending physician, personally viewed and interpreted this ECG.   Date: 04/01/2015  EKG Time: 2237  Rate: 97  Rhythm: normal sinus rhythm  Axis: normal  Intervals:none  ST&T Change: flipped t waves lead I, avL  ____________________________________________  RADIOLOGY  Chest x-ray: Reaccumulated left pleural effusion, moderate volume, pulmonary edema and cardiomegaly, pulmonary hypertension ____________________________________________   PROCEDURES  Procedure(s) performed: None  Critical Care performed: Yes, see critical care note(s)  CRITICAL CARE Performed by: Charlesetta Ivory P   Total critical care time: 30 minutes  Critical care time was exclusive of separately billable procedures and treating other patients.  Critical care was necessary to treat or prevent imminent or life-threatening deterioration.  Critical care was time spent personally by me on the following activities: development of treatment plan with patient and/or surrogate as well as nursing, discussions with consultants, evaluation of patient's response to treatment, examination of patient, obtaining history from patient or surrogate, ordering and performing treatments and interventions, ordering and review of laboratory studies, ordering and review of radiographic studies, pulse oximetry and re-evaluation of patient's condition. ____________________________________________   INITIAL IMPRESSION / ASSESSMENT AND  PLAN / ED COURSE  Pertinent labs & imaging results that were available during my care of the patient were reviewed by me and considered in my medical decision making (see chart for details).  This is a 60 year old female with a history of end-stage renal disease on dialysis who comes in today with some significant shortness of breath. I did receive the patient's blood work and it appears as though the patient has an elevated potassium of 6.0. The patient also has a left-sided pleural effusion that has reaccumulated was not there seen on the last x-ray. The patient is also hypoglycemic with a glucose in the 40s. I will give the patient some calcium gluconate as well as sodium bicarbonate, albuterol as well as insulin and dextrose. I also placed the patient on BiPAP given her increased work of breathing. She did have some improvement after the BiPAP. The patient received some Zofran for her nausea. Given this effusion as well as the hyperkalemia I feel that the patient needs to be admitted to the hospital. I did contact the hospitalist who will admit the patient and have her dialyzed as well as continue to assess her symptoms for resolution. Discussed this with the patient. ____________________________________________   FINAL CLINICAL IMPRESSION(S) / ED DIAGNOSES  Final diagnoses:  Hyperkalemia  Hypoglycemia  Pleural effusion  Dyspnea      Loney Hering, MD 04/02/15 [phone]

## 2015-04-02 NOTE — Progress Notes (Signed)
[place] [facility] [place] NAME: Kelsey Peterson    MR#:  [id number]  DATE OF BIRTH:  11-Mar-1955  SUBJECTIVE:  CHIEF COMPLAINT:   Chief Complaint  Patient presents with  . Shortness of Breath  still SOB and cough, on O2 [place] 4L.  REVIEW OF SYSTEMS:  CONSTITUTIONAL: No fever, has weakness.  EYES: No blurred or double vision.  EARS, NOSE, AND THROAT: No tinnitus or ear pain. Hard hearing. RESPIRATORY: has cough, shortness of breath, no wheezing or hemoptysis.  CARDIOVASCULAR: No chest pain, orthopnea, edema.  GASTROINTESTINAL: No nausea, vomiting, diarrhea or abdominal pain.  GENITOURINARY: No dysuria, hematuria.  ENDOCRINE: No polyuria, nocturia,  HEMATOLOGY: No anemia, easy bruising or bleeding SKIN: No rash or lesion. MUSCULOSKELETAL: No joint pain or arthritis.   NEUROLOGIC: No tingling, numbness, weakness.  PSYCHIATRY: No anxiety or depression.   DRUG ALLERGIES:   Allergies  Allergen Reactions  . Contrast Media [Iodinated Diagnostic Agents] Other (See Comments)    Unknown reaction  . Morphine And Related Hives  . Other Rash    Blood Pressure Pill (Pt doesn't remember name)     VITALS:  Blood pressure 125/50, pulse 74, temperature 97.5 F (36.4 C), temperature source Axillary, resp. rate 20, height 5\' 2"  (1.575 m), weight 80.6 kg (177 lb 11.1 oz), SpO2 95 %.  PHYSICAL EXAMINATION:  GENERAL:  60 y.o.-year-old patient lying in the bed with no acute distress.  EYES: Pupils equal, round, reactive to light and accommodation. No scleral icterus. Extraocular muscles intact.  HEENT: Head atraumatic, normocephalic. Oropharynx and nasopharynx clear.  NECK:  Supple, no jugular venous distention. No thyroid enlargement, no tenderness.  LUNGS: Normal breath sounds bilaterally, no wheezing, mild rales,no rhonchi or crepitation. No use of accessory muscles of respiration.  CARDIOVASCULAR: S1, S2 normal. No murmurs, rubs, or gallops.   ABDOMEN: Soft, nontender, nondistended. Bowel sounds present. No organomegaly or mass.  EXTREMITIES: bilateral leg edema 1+, no cyanosis, or clubbing.  NEUROLOGIC: Cranial nerves II through XII are intact. Muscle strength 5/5 in all extremities. Sensation intact. Gait not checked.  PSYCHIATRIC: The patient is alert and oriented x 3.  SKIN: No obvious rash, lesion, or ulcer.    LABORATORY PANEL:   CBC  Recent Labs Lab 04/02/15 0338  WBC 6.2  HGB 8.3*  HCT 25.6*  PLT 300   ------------------------------------------------------------------------------------------------------------------  Chemistries   Recent Labs Lab 04/01/15 2303 04/02/15 0338  NA 135 137  K 6.0* 5.1  CL 92* 95*  CO2 17* 21*  GLUCOSE 46* 35*  BUN 56* 60*  CREATININE 7.17* 7.28*  7.07*  CALCIUM 9.7 9.5  AST 54*  --   ALT 28  --   ALKPHOS 101  --   BILITOT 3.1*  --    ------------------------------------------------------------------------------------------------------------------  Cardiac Enzymes  Recent Labs Lab 04/02/15 0911  TROPONINI 0.08*   ------------------------------------------------------------------------------------------------------------------  RADIOLOGY:  Dg Chest Portable 1 View  04/02/2015  CLINICAL DATA:  Shortness of breath EXAM: PORTABLE CHEST 1 VIEW COMPARISON:  02/05/2015 FINDINGS: Chronic cardiopericardial enlargement and vascular pedicle widening. The aorta is tortuous. Pulmonary arteries are enlarged, as confirmed on 2016 chest CT. There is a recurrent moderate left pleural effusion. Diffuse interstitial coarsening. IMPRESSION: 1. Re- accumulated left pleural effusion, moderate volume. 2. Pulmonary edema and chronic cardiomegaly. 3. Pulmonary hypertension. Electronically Signed   By: Monte Fantasia M.D.   On: 04/02/2015 00:05    EKG:   Orders placed or performed during the hospital encounter of  04/01/15  . EKG 12-Lead  . EKG 12-Lead    ASSESSMENT AND PLAN:    Acute respiratory failure with hypoxia (HCC) - oxygenation improved with BiPAP.  Now on O2 via nasal cannula.   End stage renal disease on dialysis [facility]) -continue HD.  Hyperkalemia - patient was giving shifting medications in the ED, improved.   Fluid overload - continue HD.  Pleural effusion, left - recurrent effusion. Last time she was here she had thoracentesis done with drainage of the same. We will dialyze her first, and reassess need for thoracentesis based on her stability.   Acute on chronic systolic heart failure ([place]) - likely provoked by her fluid overload, continue HD and increase lasix bid on non-HD day per Dr. Candiss Norse.   COPD (chronic obstructive pulmonary disease) ([place]) - continue home inhalers, and duo nebs when necessary.    HTN (hypertension) - controlled, continue home meds and when necessary IV meds for blood pressure goal less than 180/100 initially, to then be titrated down to 160/100 if possible.  CAD (coronary artery disease) - continue home meds, stable cardiac enzymes  GERD (gastroesophageal reflux disease) - home dose PPI     All the records are reviewed and case discussed with Care Management/Social Workerr. Management plans discussed with the patient, her son and they are in agreement.  CODE STATUS: full code.  TOTAL TIME TAKING CARE OF THIS PATIENT: 42 minutes.  Greater than 50% time was spent on coordination of care and face-to-face counseling.  POSSIBLE D/C IN 3 DAYS, DEPENDING ON CLINICAL CONDITION.   Demetrios Loll M.D on 04/02/2015 at 2:02 PM  Between 7am to 6pm - Pager - [phone]  After 6pm go to [email or web address] - password [email or web address] [facility]  Office  [phone]  CC: Primary care physician; Lavera Guise, MD

## 2015-04-02 NOTE — Progress Notes (Signed)
Pt now refusing to wear bipap.  Pt stated it is too uncomfortable and ripped it off of her face.  Pt stated that she is not wearing that mask.  4L nasal cannula was given to pt to wear.  Respiratory was called.  Respiratory stated that since her sats were in the 90's and RR were within reason, it was ok for pt to stay on the nasal cannula.  Pt also refused heparin shot.

## 2015-04-02 NOTE — Progress Notes (Signed)
*  PRELIMINARY RESULTS* Echocardiogram 2D Echocardiogram has been performed.  Kelsey Peterson 04/02/2015, 3:07 PM

## 2015-04-02 NOTE — Care Management (Signed)
Patient with esrd admitted to icu due to the need for continuous bipap.  She is followed by [facility] at [facility] on [place].  She has chronic home 02 through [facility].  She has had 6 admissions within the last 6 months.  She declined home health services last admission.  CM will speak with patient when she is transferred to 2A later today.  She may benefit from [facility] referral if she would accept

## 2015-04-02 NOTE — Progress Notes (Signed)
Pt refusing bed alarm. Pt educated about the importance of safety. Will round frequently to ensure pt safety. Will continue to monitor.   Iran Sizer M

## 2015-04-02 NOTE — Progress Notes (Signed)
Pt currently refusing all medications. Pt states " I do not want to take any medication, I just want to sleep". RN educated pt on the importance of taken her medications. Pt is still refusing. Will continue to monitor.   Kelsey Peterson

## 2015-04-02 NOTE — Progress Notes (Signed)
Patient transferred from ICU. Oriented to room and made comfortable. Tele monitor hooked up. Per transporter, he will be back to get patient for dialysis. Consents have not be completed, will get patient to sign before transport. Patient states she does not want any food at this time because she has been throwing up and having diarrhea. Only requesting ice chips at this time. Will continue to monitor.

## 2015-04-02 NOTE — Progress Notes (Addendum)
Pharmacy ICU Daily Progress Note  CR is a 60yo female admitted 04/01/15 for SOB, N/V 2/2 electrolyte disturbances and pleural effusion.  Pt is on HD TTS. K on admit 6.0, BG in the 40s.  Scr 7.28  Active pharmacy consults: electrolyte management  Plan: 1. No supplementation at this time. Will follow up on K tomorrow with AM labs.  Infectious:  Antimicrobials: None WBC 6.2 Afebrile MRSA PCR (-)  Electrolytes:  Potassium: 5.1 Magnesium: 2.4 Phosphorus: 8.4 Supplementation plans: No supplementation at present.  Pulmonary: Bronchodilators? duonebs q4hrs PRN, Dulera 2 puffs BID, spiriva daily Ventilator status: No O2 sat/FiO2: 90 on Bi-PAP  Current steroids:  Taper plans:   GI:  Constipation PPx: None Feeding status: renal diet, fluid restriction, thin liquids LBM: None SUP: Pantoprazole 40mg  PO daily  Insulin:  SSI use in 24hrs: none Current Insulin orders: insulin glargine 10 units x1 at 0100 on 3/14 Last 3 CBGs: 35  DVT PPx: Heparin 5000 SQ q8hr, also on home plavix  Sedation+Pain: None RASS goal? 0 GCS 15 Last pain score: 0 Opioid use in last 24 hrs:   Pressors: None MAP goal:   Medication education/counseling required?

## 2015-04-02 NOTE — H&P (Addendum)
[place] [facility] [place] NAME: Kelsey Peterson    MR#:  [id number]  DATE OF BIRTH:  10/24/1955  DATE OF ADMISSION:  04/01/2015  PRIMARY CARE PHYSICIAN: Lavera Guise, MD   REQUESTING/REFERRING PHYSICIAN: Dahlia Client, MD  CHIEF COMPLAINT:   Chief Complaint  Patient presents with  . Shortness of Breath    HISTORY OF PRESENT ILLNESS:  Kelsey Peterson  is a 60 y.o. female who presents with acute onset dyspnea. Patient states she was about her usual activities when she became acutely short of breath this evening. She is an end-stage renal disease patient on dialysis. She states that her last dialysis session was this past Saturday, and on schedule. Evaluation in the ED she was found to have significant pulmonary edema on chest x-ray as well as recurrence of a left pleural effusion. She was also found to be hypertensive, hyperkalemic, and with an elevated troponin is 0.08. She was placed on BiPAP in the ED with moderate improvement in her respiratory symptoms and good improvement in her oxygenation. Hospitalists were called for admission.  PAST MEDICAL HISTORY:   Past Medical History  Diagnosis Date  . COPD (chronic obstructive pulmonary disease) ([place])   . ESRD on hemodialysis ([place])   . DM2 (diabetes mellitus, type 2) ([place])   . Hepatitis C   . Migraine with aura   . HOH (hard of hearing)   . Mitral regurgitation     a. echo 06/2014: EF 60%, no WMA, GR1DD, mild AI, calcified mitral annulus, mod MR, no atrial septal defect or patent foramen ovale   . Cognitive impairment   . CAD (coronary artery disease)   . HTN (hypertension)     PAST SURGICAL HISTORY:   Past Surgical History  Procedure Laterality Date  . Dialysis fistula creation    . Tubal ligation    . Colonoscopy with propofol Left 07/09/2014    Procedure: COLONOSCOPY WITH PROPOFOL;  Surgeon: Hulen Luster, MD;  Location: [facility] ENDOSCOPY;  Service: Endoscopy;  Laterality: Left;  .  Colonoscopy N/A 07/15/2014    Procedure: COLONOSCOPY;  Surgeon: Manya Silvas, MD;  Location: [facility] ENDOSCOPY;  Service: Endoscopy;  Laterality: N/A;    SOCIAL HISTORY:   Social History  Substance Use Topics  . Smoking status: Never Smoker   . Smokeless tobacco: Never Used  . Alcohol Use: No    FAMILY HISTORY:   Family History  Problem Relation Age of Onset  . Heart disease Mother   . Hypertension Mother     DRUG ALLERGIES:   Allergies  Allergen Reactions  . Contrast Media [Iodinated Diagnostic Agents] Other (See Comments)    Unknown reaction  . Morphine And Related Hives  . Other Rash    Blood Pressure Pill (Pt doesn't remember name)     MEDICATIONS AT HOME:   Prior to Admission medications   Medication Sig Start Date End Date Taking? Authorizing Provider  ADVAIR DISKUS 250-50 MCG/DOSE AEPB Inhale 1 puff into the lungs 2 (two) times daily. 02/04/15   Historical Provider, MD  albuterol (PROVENTIL HFA;VENTOLIN HFA) 108 (90 BASE) MCG/ACT inhaler Inhale 2 puffs into the lungs every 6 (six) hours as needed for wheezing or shortness of breath. Patient taking differently: Inhale 2 puffs into the lungs every 4 (four) hours as needed for wheezing or shortness of breath.  09/06/14   [name], MD  amiodarone (PACERONE) 200 MG tablet Take 200 mg by mouth 2 (two) times daily.  Historical Provider, MD  amLODipine (NORVASC) 5 MG tablet Take 5 mg by mouth daily.    Historical Provider, MD  calcium acetate (PHOSLO) 667 MG capsule Take 2 capsules by mouth daily. 10/05/14   Historical Provider, MD  calcium carbonate (TUMS - DOSED IN MG ELEMENTAL CALCIUM) 500 MG chewable tablet Chew 1 tablet by mouth 2 (two) times daily after a meal. Take 2 hours after meal.    Historical Provider, MD  cinacalcet (SENSIPAR) 60 MG tablet Take 60 mg by mouth daily.    Historical Provider, MD  clopidogrel (PLAVIX) 75 MG tablet Take 75 mg by mouth daily. 08/31/14   Historical Provider, MD   esomeprazole (NEXIUM) 40 MG capsule Take 40 mg by mouth daily.     Historical Provider, MD  ferrous sulfate 325 (65 FE) MG tablet Take 325 mg by mouth daily.    Historical Provider, MD  folic acid-vitamin b complex-vitamin c-selenium-zinc (DIALYVITE) 3 MG TABS tablet Take 1 tablet by mouth daily.    Historical Provider, MD  furosemide (LASIX) 80 MG tablet Take 80 mg by mouth daily. Patient takes on non dialysis days.    Historical Provider, MD  isosorbide mononitrate (IMDUR) 30 MG 24 hr tablet Take 30 mg by mouth daily.    Historical Provider, MD  metoprolol tartrate (LOPRESSOR) 25 MG tablet Take 12.5 mg by mouth 2 (two) times daily. 10/21/14   Historical Provider, MD  ondansetron (ZOFRAN) 4 MG tablet Take 4 mg by mouth every 8 (eight) hours as needed for nausea or vomiting. Reported on 02/05/2015    Historical Provider, MD  SPIRIVA HANDIHALER 18 MCG inhalation capsule Place 1 capsule into inhaler and inhale daily. 02/04/15   Historical Provider, MD    REVIEW OF SYSTEMS:  Review of Systems  Constitutional: Negative for fever, chills, weight loss and malaise/fatigue.  HENT: Negative for ear pain, hearing loss and tinnitus.   Eyes: Negative for blurred vision, double vision, pain and redness.  Respiratory: Positive for shortness of breath. Negative for cough and hemoptysis.   Cardiovascular: Negative for chest pain, palpitations, orthopnea and leg swelling.  Gastrointestinal: Negative for nausea, vomiting, abdominal pain, diarrhea and constipation.  Genitourinary: Negative for dysuria, frequency and hematuria.  Musculoskeletal: Negative for back pain, joint pain and neck pain.  Skin:       No acne, rash, or lesions  Neurological: Negative for dizziness, tremors, focal weakness and weakness.  Endo/Heme/Allergies: Negative for polydipsia. Does not bruise/bleed easily.  Psychiatric/Behavioral: Negative for depression. The patient is not nervous/anxious and does not have insomnia.      VITAL  SIGNS:   Filed Vitals:   04/01/15 2345 04/02/15 0030 04/02/15 0100 04/02/15 0130  BP:  182/115 168/109 182/108  Pulse: 100 100 94 101  Temp:      TempSrc:      Resp: 40 31 26 28   Height:      Weight:      SpO2: 97% 98% 100% 100%   Wt Readings from Last 3 Encounters:  04/01/15 72.576 kg (160 lb)  03/25/15 79.833 kg (176 lb)  02/27/15 63.504 kg (140 lb)    PHYSICAL EXAMINATION:  Physical Exam  Vitals reviewed. Constitutional: She is oriented to person, place, and time. She appears well-developed and well-nourished. No distress.  HENT:  Head: Normocephalic and atraumatic.  Mouth/Throat: Oropharynx is clear and moist.  Eyes: Conjunctivae and EOM are normal. Pupils are equal, round, and reactive to light. No scleral icterus.  Neck: Normal range of motion. Neck supple.  No JVD present. No thyromegaly present.  Cardiovascular: Regular rhythm and intact distal pulses.  Exam reveals no gallop and no friction rub.   No murmur heard. Tachycardic  Respiratory: She is in respiratory distress. She has no wheezes. She has rales.  GI: Soft. Bowel sounds are normal. She exhibits no distension. There is no tenderness.  Musculoskeletal: Normal range of motion. She exhibits edema.  No arthritis, no gout  Lymphadenopathy:    She has no cervical adenopathy.  Neurological: She is alert and oriented to person, place, and time. No cranial nerve deficit.  No dysarthria, no aphasia  Skin: Skin is warm and dry. No rash noted. No erythema.  Psychiatric: She has a normal mood and affect. Her behavior is normal. Judgment and thought content normal.    LABORATORY PANEL:   CBC  Recent Labs Lab 04/01/15 2303  WBC 6.0  HGB 9.3*  HCT 28.8*  PLT 352   ------------------------------------------------------------------------------------------------------------------  Chemistries   Recent Labs Lab 04/01/15 2303  NA 135  K 6.0*  CL 92*  CO2 17*  GLUCOSE 46*  BUN 56*  CREATININE 7.17*   CALCIUM 9.7  AST 54*  ALT 28  ALKPHOS 101  BILITOT 3.1*   ------------------------------------------------------------------------------------------------------------------  Cardiac Enzymes  Recent Labs Lab 04/01/15 2303  TROPONINI 0.08*   ------------------------------------------------------------------------------------------------------------------  RADIOLOGY:  Dg Chest Portable 1 View  04/02/2015  CLINICAL DATA:  Shortness of breath EXAM: PORTABLE CHEST 1 VIEW COMPARISON:  02/05/2015 FINDINGS: Chronic cardiopericardial enlargement and vascular pedicle widening. The aorta is tortuous. Pulmonary arteries are enlarged, as confirmed on 2016 chest CT. There is a recurrent moderate left pleural effusion. Diffuse interstitial coarsening. IMPRESSION: 1. Re- accumulated left pleural effusion, moderate volume. 2. Pulmonary edema and chronic cardiomegaly. 3. Pulmonary hypertension. Electronically Signed   By: Monte Fantasia M.D.   On: 04/02/2015 00:05    EKG:   Orders placed or performed during the hospital encounter of 02/05/15  . EKG 12-Lead  . EKG 12-Lead  . ED EKG  . ED EKG  . EKG 12-Lead  . EKG 12-Lead    IMPRESSION AND PLAN:  Principal Problem:   Acute respiratory failure with hypoxia (HCC) - oxygenation improved with BiPAP. Patient still has significant symptoms, but states that she feels like she breathes better with O2 via nasal cannula rather than the BiPAP. We will have respiratory test her for possible weaning. Respiratory failure is likely from her fluid overload and heart failure exacerbation in conjunction with her recurrence of pleural effusion. Treat other problems as below. Active Problems:   End stage renal disease on dialysis [facility]) - patient will benefit from dialysis. There are no EKG changes despite her hyperkalemia. We'll get a nephrology consult for dialysis support.   Hyperkalemia - patient was giving shifting medications in the ED, we'll recheck her  potassium with morning labs, and plan for dialysis as above.   Fluid overload - patient benefit from a dialysis session as above.   Pleural effusion, left - recurrent effusion. Last time she was here she had thoracentesis done with drainage of the same. We will dialyze her first, and reassess need for thoracentesis based on her stability.   Acute on chronic systolic heart failure ([place]) - likely provoked by her fluid overload, although unclear if she did perhaps have heart failure exacerbation first. Either way she'll benefit from intravascular volume reduction to dialysis.   COPD (chronic obstructive pulmonary disease) ([place]) - continue home inhalers, and duo nebs when necessary here  HTN (hypertension) - significantly elevated, continue home meds and when necessary IV meds for blood pressure goal less than 180/100 initially, to then be titrated down to 160/100 if possible.   CAD (coronary artery disease) - continue home meds, serial trending cardiac enzymes   GERD (gastroesophageal reflux disease) - home dose PPI  All the records are reviewed and case discussed with ED provider. Management plans discussed with the patient and/or family.  DVT PROPHYLAXIS: SubQ heparin  GI PROPHYLAXIS: PPI  ADMISSION STATUS: Inpatient  CODE STATUS: Full Code Status History    Date Active Date Inactive Code Status Order ID Comments User Context   02/05/2015  9:17 AM 02/08/2015  8:00 PM Full Code [id number]  Fritzi Mandes, MD Inpatient   12/01/2014  3:08 PM 12/07/2014  7:15 PM Full Code [id number]  Demetrios Loll, MD Inpatient   09/01/2014  5:57 PM 09/06/2014  2:09 PM Full Code [id number]  Demetrios Loll, MD ED   07/14/2014  5:17 AM 07/16/2014  6:40 PM Full Code [id number]  Lance Coon, MD Inpatient   07/03/2014  2:06 PM 07/10/2014  7:11 PM Full Code [id number]  Demetrios Loll, MD Inpatient   06/21/2014  1:35 PM 06/22/2014  4:33 PM Full Code [id number]  Max Sane, MD Inpatient      TOTAL CRITICAL CARE TIME TAKING CARE OF THIS PATIENT: 45  minutes.    Eragon Hammond [place] 04/02/2015, 1:44 AM  [facility]  Office  [phone]  CC: Primary care physician; Lavera Guise, MD

## 2015-04-02 NOTE — Progress Notes (Signed)
MD aware of low blood sugar.  Morning lab draw was drawn before amp of D50 was given.

## 2015-04-02 NOTE — Progress Notes (Signed)
eLink Physician-Brief Progress Note Patient Name: ANNAKATE FASBENDER DOB: 1955-05-09 MRN: [id number]   Date of Service  04/02/2015  HPI/Events of Note  Admitted for respiratory distress, on BIPAP, ESRD, pulmonary edema Called for hypoglycemia post insulin for hyperkalemia  eICU Interventions  D50 now Will clarify plans for HD with hospitalist     Intervention Category Evaluation Type: New Patient Evaluation  Simonne Maffucci 04/02/2015, 3:38 AM

## 2015-04-03 DIAGNOSIS — J9601 Acute respiratory failure with hypoxia: Secondary | ICD-10-CM

## 2015-04-03 DIAGNOSIS — I10 Essential (primary) hypertension: Secondary | ICD-10-CM

## 2015-04-03 DIAGNOSIS — N186 End stage renal disease: Secondary | ICD-10-CM

## 2015-04-03 DIAGNOSIS — E8770 Fluid overload, unspecified: Secondary | ICD-10-CM

## 2015-04-03 DIAGNOSIS — Z992 Dependence on renal dialysis: Secondary | ICD-10-CM

## 2015-04-03 DIAGNOSIS — I5023 Acute on chronic systolic (congestive) heart failure: Secondary | ICD-10-CM

## 2015-04-03 LAB — BASIC METABOLIC PANEL
Anion gap: 10 (ref 5–15)
BUN: 31 mg/dL — AB (ref 6–20)
CO2: 29 mmol/L (ref 22–32)
CREATININE: 4.77 mg/dL — AB (ref 0.44–1.00)
Calcium: 8.1 mg/dL — ABNORMAL LOW (ref 8.9–10.3)
Chloride: 97 mmol/L — ABNORMAL LOW (ref 101–111)
GFR calc Af Amer: 11 mL/min — ABNORMAL LOW (ref >60)
GFR, EST NON AFRICAN AMERICAN: 9 mL/min — AB (ref >60)
GLUCOSE: 69 mg/dL (ref 65–99)
POTASSIUM: 4.3 mmol/L (ref 3.5–5.1)
Sodium: 136 mmol/L (ref 135–145)

## 2015-04-03 LAB — GLUCOSE, CAPILLARY: Glucose-Capillary: 131 mg/dL — ABNORMAL HIGH (ref 65–99)

## 2015-04-03 LAB — HEMOGLOBIN A1C
HEMOGLOBIN A1C: UNDETERMINED % (ref 4.0–6.0)
Mean Plasma Glucose: UNDETERMINED

## 2015-04-03 LAB — HEPATITIS B SURFACE ANTIGEN: HEP B S AG: NEGATIVE

## 2015-04-03 MED ORDER — INSULIN ASPART 100 UNIT/ML ~~LOC~~ SOLN: 0.0000 [IU] | Freq: Three times a day (TID) | SUBCUTANEOUS | Status: DC

## 2015-04-03 MED ORDER — AMIODARONE HCL 200 MG PO TABS
200.0000 mg | ORAL_TABLET | Freq: Every day | ORAL | Status: DC
  Administered 2015-04-03 – 2015-04-04 (×2): 200 mg via ORAL
  Filled 2015-04-03: qty 1

## 2015-04-03 NOTE — Progress Notes (Signed)
Subjective:  Patient known to our practice from outpatient dialysis She presents from home for SOB Breathing much better today.  3 L removed with dialysis yesterday Asking to take home inhalers with her   Objective:  Vital signs in last 24 hours:  Temp:  [97.2 F (36.2 C)-98 F (36.7 C)] 97.9 F (36.6 C) (03/15 1159) Pulse Rate:  [78-86] 85 (03/15 1159) Resp:  [16-25] 18 (03/15 1159) BP: (132-171)/(70-88) 156/81 mmHg (03/15 1159) SpO2:  [86 %-97 %] 94 % (03/15 1159) Weight:  [73.1 kg (161 lb 2.5 oz)-76.8 kg (169 lb 5 oz)] 76.703 kg (169 lb 1.6 oz) (03/15 0601)  Weight change: 4.224 kg (9 lb 5 oz) Filed Weights   04/02/15 1819 04/02/15 2227 04/03/15 0601  Weight: 76.8 kg (169 lb 5 oz) 73.1 kg (161 lb 2.5 oz) 76.703 kg (169 lb 1.6 oz)    Intake/Output:    Intake/Output Summary (Last 24 hours) at 04/03/15 1613 Last data filed at 04/03/15 1130  Gross per 24 hour  Intake    480 ml  Output   3000 ml  Net  -2520 ml     Physical Exam: General: NAD  HEENT Decreased hearing, mouth dry  Neck supple  Pulm/lungs Clear today, normal effort, [place] oxygen  CVS/Heart Regular, no rub  Abdomen:  Soft, NT  Extremities: + dependent edema  Neurologic: Alert, follows commands  Skin: Warm, dry  Access: AVF       Basic Metabolic Panel:   Recent Labs Lab 04/01/15 2303 04/02/15 0338 04/03/15 0521  NA 135 137 136  K 6.0* 5.1 4.3  CL 92* 95* 97*  CO2 17* 21* 29  GLUCOSE 46* 35* 69  BUN 56* 60* 31*  CREATININE 7.17* 7.28*  7.07* 4.77*  CALCIUM 9.7 9.5 8.1*  MG  --  2.4  --   PHOS  --  8.4*  --      CBC:  Recent Labs Lab 04/01/15 2303 04/02/15 0338  WBC 6.0 6.2  HGB 9.3* 8.3*  HCT 28.8* 25.6*  MCV 79.8* 80.2  PLT 352 300      Microbiology:  Recent Results (from the past 720 hour(s))  MRSA PCR Screening     Status: None   Collection Time: 04/02/15  3:32 AM  Result Value Ref Range Status   MRSA by PCR NEGATIVE NEGATIVE Final    Comment:        The  GeneXpert MRSA Assay (FDA approved for NASAL specimens only), is one component of a comprehensive MRSA colonization surveillance program. It is not intended to diagnose MRSA infection nor to guide or monitor treatment for MRSA infections.     Coagulation Studies: No results for input(s): LABPROT, INR in the last 72 hours.  Urinalysis: No results for input(s): COLORURINE, LABSPEC, PHURINE, GLUCOSEU, HGBUR, BILIRUBINUR, KETONESUR, PROTEINUR, UROBILINOGEN, NITRITE, LEUKOCYTESUR in the last 72 hours.  Invalid input(s): APPERANCEUR    Imaging: Dg Chest Portable 1 View  04/02/2015  CLINICAL DATA:  Shortness of breath EXAM: PORTABLE CHEST 1 VIEW COMPARISON:  02/05/2015 FINDINGS: Chronic cardiopericardial enlargement and vascular pedicle widening. The aorta is tortuous. Pulmonary arteries are enlarged, as confirmed on 2016 chest CT. There is a recurrent moderate left pleural effusion. Diffuse interstitial coarsening. IMPRESSION: 1. Re- accumulated left pleural effusion, moderate volume. 2. Pulmonary edema and chronic cardiomegaly. 3. Pulmonary hypertension. Electronically Signed   By: Monte Fantasia M.D.   On: 04/02/2015 00:05     Medications:     . [START ON 04/04/2015] amiodarone  200 mg Oral Daily  . amLODipine  5 mg Oral Daily  . antiseptic oral rinse  7 mL Mouth Rinse q12n4p  . calcium acetate  1,334 mg Oral Q breakfast  . chlorhexidine  15 mL Mouth Rinse BID  . cinacalcet  60 mg Oral Q breakfast  . clopidogrel  75 mg Oral Daily  . epoetin (EPOGEN/PROCRIT) injection  10,000 Units Intravenous Q T,Th,Sa-HD  . Ferrous Fumarate  1 tablet Oral Daily  . furosemide  80 mg Oral BID  . heparin  5,000 Units Subcutaneous 3 times per day  . isosorbide mononitrate  30 mg Oral Daily  . metoprolol tartrate  12.5 mg Oral BID  . mometasone-formoterol  2 puff Inhalation BID  . pantoprazole  40 mg Oral Daily  . sodium chloride flush  3 mL Intravenous Q12H  . tiotropium  1 capsule Inhalation  Daily   acetaminophen **OR** acetaminophen, ipratropium-albuterol, labetalol, lidocaine-prilocaine, ondansetron **OR** ondansetron (ZOFRAN) IV  Assessment/ Plan:  60 y.o. 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   1. End-stage renal disease on hemodialysis Tuesday, Thursday, Saturday.  - routine dialysis on THURSDAY  2. Pulmonary edema/left pleural effusion/shortness of breath. Status post left thoracentesis yielding 1.1 L of pleural fluid 02/05/15. -  Continue ultrafiltration with dialysis as tolerated -  3 kg above EDW- improved with HD. 3 L removed -  Continue furosemide. Increase to twice daily on non dialysis days  3. Anemia of chronic kidney disease.   epogen 10000 units IV with HD.  Oral iron- patient is allergic to iv iron  4. Secondary hyperparathyroidism.   continue to monitor.  PhosLo     LOS: 1 Nizar Cutler 3/15/20174:13 PM

## 2015-04-03 NOTE — Progress Notes (Signed)
MEDICATION RELATED CONSULT NOTE - FOLLOW UP   Pharmacy Consult for Electrolytes Indication:   Allergies  Allergen Reactions  . Contrast Media [Iodinated Diagnostic Agents] Other (See Comments)    Unknown reaction  . Morphine And Related Hives  . Other Rash    Blood Pressure Pill (Pt doesn't remember name)     Patient Measurements: Height: 5\' 2"  (157.5 cm) Weight: 169 lb 1.6 oz (76.703 kg) IBW/kg (Calculated) : 50.1 Adjusted Body Weight:   Vital Signs: Temp: 98 F (36.7 C) (03/15 0601) Temp Source: Oral (03/15 0601) BP: 153/76 mmHg (03/15 0601) Pulse Rate: 86 (03/15 0601) Intake/Output from previous day: 03/14 0701 - 03/15 0700 In: -  Out: 3000  Intake/Output from this shift:    Labs:  Recent Labs  04/01/15 2303 04/02/15 0338 04/03/15 0521  WBC 6.0 6.2  --   HGB 9.3* 8.3*  --   HCT 28.8* 25.6*  --   PLT 352 300  --   CREATININE 7.17* 7.28*  7.07* 4.77*  MG  --  2.4  --   PHOS  --  8.4*  --   ALBUMIN 4.1  --   --   PROT 8.7*  --   --   AST 54*  --   --   ALT 28  --   --   ALKPHOS 101  --   --   BILITOT 3.1*  --   --    Lab Results  Component Value Date   K 4.3 04/03/2015   Estimated Creatinine Clearance: 12 mL/min (by C-G formula based on Kelsey Peterson of 4.77).    Medications:  Scheduled:  . amiodarone  200 mg Oral BID  . amLODipine  5 mg Oral Daily  . antiseptic oral rinse  7 mL Mouth Rinse q12n4p  . calcium acetate  1,334 mg Oral Q breakfast  . chlorhexidine  15 mL Mouth Rinse BID  . cinacalcet  60 mg Oral Q breakfast  . clopidogrel  75 mg Oral Daily  . epoetin (EPOGEN/PROCRIT) injection  10,000 Units Intravenous Q T,Th,Sa-HD  . Ferrous Fumarate  1 tablet Oral Daily  . furosemide  80 mg Oral BID  . heparin  5,000 Units Subcutaneous 3 times per day  . isosorbide mononitrate  30 mg Oral Daily  . metoprolol tartrate  12.5 mg Oral BID  . mometasone-formoterol  2 puff Inhalation BID  . pantoprazole  40 mg Oral Daily  . sodium chloride flush  3 mL  Intravenous Q12H  . tiotropium  1 capsule Inhalation Daily    Assessment: Kelsey Peterson is a 60yo female admitted 04/01/15 for SOB, N/V 2/2 electrolyte disturbances and pleural effusion.  Pt is on HD TTS.  Goal of Therapy:  Electrolytes WNL  Plan:  3/15: K= 4.3   (3/14 Mag=2.4,  Phos= 8.4).  No supplementation. F/U labs in am. Note: Patient on Furosemide 80mg  po bid.  [name] 04/03/2015,7:38 AM

## 2015-04-03 NOTE — Progress Notes (Signed)
Hospital Problem List     Principal Problem:   Acute respiratory failure with hypoxia (HCC) Active Problems:   End stage renal disease on dialysis (HCC)   GERD (gastroesophageal reflux disease)   COPD (chronic obstructive pulmonary disease) (HCC)   HTN (hypertension)   CAD (coronary artery disease)   Hyperkalemia   Fluid overload   Acute on chronic systolic heart failure (HCC)   Pleural effusion, left    Patient Profile:   Primary Cardiologist: [facility]  60 y.o. female w/ PMH of CAD (per Care Everywhere), chronic systolic CHF (EF [id number] by echo in 06/2014), ESRD (on HD - T,Th,Sat), HTN, HLD, Type 2 DM, moderate mitral regurgitation, and Hepatitis C who presented to [facility] on 04/01/2015 for worsening shortness of breath, vomiting, and dizziness.  Subjective   Refused medications overnight. Says her breathing is significantly improved compared to admission. Denies any chest pain or palpitations. Concerned about the cost of nebulizer treatments following discharge.  Inpatient Medications    . amiodarone  200 mg Oral BID  . amLODipine  5 mg Oral Daily  . antiseptic oral rinse  7 mL Mouth Rinse q12n4p  . calcium acetate  1,334 mg Oral Q breakfast  . chlorhexidine  15 mL Mouth Rinse BID  . cinacalcet  60 mg Oral Q breakfast  . clopidogrel  75 mg Oral Daily  . epoetin (EPOGEN/PROCRIT) injection  10,000 Units Intravenous Q T,Th,Sa-HD  . Ferrous Fumarate  1 tablet Oral Daily  . furosemide  80 mg Oral BID  . heparin  5,000 Units Subcutaneous 3 times per day  . isosorbide mononitrate  30 mg Oral Daily  . metoprolol tartrate  12.5 mg Oral BID  . mometasone-formoterol  2 puff Inhalation BID  . pantoprazole  40 mg Oral Daily  . sodium chloride flush  3 mL Intravenous Q12H  . tiotropium  1 capsule Inhalation Daily    Vital Signs    Filed Vitals:   04/02/15 2150 04/02/15 2200 04/02/15 2227 04/03/15 0601  BP: 161/70  171/73 153/76  Pulse: 84 84 85 86  Temp:  97.2 F (36.2 C) 98 F  (36.7 C) 98 F (36.7 C)  TempSrc:  Oral Oral Oral  Resp: 23  16 16   Height:      Weight:   161 lb 2.5 oz (73.1 kg) 169 lb 1.6 oz (76.703 kg)  SpO2:   97% 93%    Intake/Output Summary (Last 24 hours) at 04/03/15 0743 Last data filed at 04/03/15 0117  Gross per 24 hour  Intake      0 ml  Output   3000 ml  Net  -3000 ml   Filed Weights   04/02/15 1819 04/02/15 2227 04/03/15 0601  Weight: 169 lb 5 oz (76.8 kg) 161 lb 2.5 oz (73.1 kg) 169 lb 1.6 oz (76.703 kg)    Physical Exam    General: Well developed, well nourished, African American female appearing in no acute distress. Hard of hearing. Head: Normocephalic, atraumatic.  Neck: Supple without bruits, JVD not elevated. Lungs:  Resp regular and unlabored, decreased breath sounds at the bases. Wheezing improved. Heart: RRR, S1, S2, no S3, S4, or murmur; no rub. Abdomen: Soft, non-tender, non-distended with normoactive bowel sounds. No hepatomegaly. No rebound/guarding. No obvious abdominal masses. Extremities: No clubbing, cyanosis, or edema. Distal pedal pulses are 1+ bilaterally. Neuro: Alert and oriented X 3. Moves all extremities spontaneously. Psych: Normal affect.  Labs    CBC  Recent Labs  04/01/15 2303  04/02/15 0338  WBC 6.0 6.2  HGB 9.3* 8.3*  HCT 28.8* 25.6*  MCV 79.8* 80.2  PLT 352 [id number]   Basic Metabolic Panel  Recent Labs  04/02/15 0338 04/03/15 0521  NA 137 136  K 5.1 4.3  CL 95* 97*  CO2 21* 29  GLUCOSE 35* 69  BUN 60* 31*  CREATININE 7.28*  7.07* 4.77*  CALCIUM 9.5 8.1*  MG 2.4  --   PHOS 8.4*  --    Liver Function Tests  Recent Labs  04/01/15 2303  AST 54*  ALT 28  ALKPHOS 101  BILITOT 3.1*  PROT 8.7*  ALBUMIN 4.1   Cardiac Enzymes  Recent Labs  04/02/15 0338 04/02/15 0911 04/02/15 1538  TROPONINI 0.09* 0.08* 0.07*   Telemetry    1st degree AV Block; HR in 70's - 80's. No atopic events.  ECG    1st degree AV Block, HR 71, incomplete RBBB.   Cardiac Studies and  Radiology    Dg Chest Portable 1 View: 04/02/2015  CLINICAL DATA:  Shortness of breath EXAM: PORTABLE CHEST 1 VIEW COMPARISON:  02/05/2015 FINDINGS: Chronic cardiopericardial enlargement and vascular pedicle widening. The aorta is tortuous. Pulmonary arteries are enlarged, as confirmed on 2016 chest CT. There is a recurrent moderate left pleural effusion. Diffuse interstitial coarsening. IMPRESSION: 1. Re- accumulated left pleural effusion, moderate volume. 2. Pulmonary edema and chronic cardiomegaly. 3. Pulmonary hypertension. Electronically Signed   By: Monte Fantasia M.D.   On: 04/02/2015 00:05    Echocardiogram:  Study Conclusions - Left ventricle: The cavity size was normal. There was moderate  concentric hypertrophy. Systolic function was moderately reduced.  The estimated ejection fraction was in the range of 35% to 40%.  Images suggestive of hypokinesis of the anterior myocardium.  Hypokinesis of the anteroseptal myocardium. Doppler parameters  are consistent with abnormal left ventricular relaxation (grade 1  diastolic dysfunction). - Mitral valve: There was moderate regurgitation. - Left atrium: The atrium was mildly dilated. - Right ventricle: The cavity size was moderately dilated. Wall  thickness was normal. - Right atrium: The atrium was mildly dilated. - Tricuspid valve: There was moderate regurgitation. - Pulmonary arteries: Systolic pressure was severely elevated. PA  peak pressure: 62 mm Hg (S).  Impressions: - Left pleural effusion noted, 8.5 cm.  Assessment & Plan    1. Acute Hypoxic Respiratory Failure - occurred in the setting of worsening vomiting and diarrhea for the past two days. - reports having a history of "chronic shortness of breath", but says it has been worse over the past several days.  - initially required BiPAP due to hypoxia in the 80's. Now on 2L [place] and stating appropriately. - reports significant improvement in her respiratory symptoms  at home with Nebulizer treatments, however she has been unable to afford those. She also has portable O2 at home but reports trouble carrying the tanks, due to their weight. Would benefit from Case Management in regards to financial assistance programs for her nebulizer treatments and possible alternatives to her current oxygen tank to improve compliance. - Fluid removal per nephrology.   2. Acute on Chronic Systolic CHF - EF [id number] by echo in 06/2014. Repeat echo this admission shows EF of 35-40%, with hypokinesis of the anteroseptal myocardium (noted on previous echo).  - the patient was not compliant with her medication regimen of taking Lasix 80mg  daily on non-HD days. This was reviewed with her today. The importance of fluid and sodium restriction was also reviewed. She is  unaware of her baseline weight. - obtain daily weights with strict I&O's. - continue BB. Fluid removal per Nephrology. Net recorded output of -3.0L thus far.  3. Moderate Mitral Regurgitation - noted on echo in 06/2014 and repeat echo this admission. - follow-up by her Primary Cardiologist.  4. Left Pleural Effusion - CXR this admission shows re-accumulated left pleural effusion. Echo confirmed with estimated size of 8.5 cm. (performed before HD)  - will need repeat CXR following volume removal via HD to reassess. She did require a thoracentesis last admission.  5. Elevated Troponin/ History of CAD - cyclic troponin values have been 0.08, 0.09, and 0.08.  - had recent PET Myocardial Perfusion in 06/2014 which was performed at [facility] and showed no evidence of ischemia. - likely due to demand ischemia in the setting of acute hypoxic respiratory failure, volume overload, and ESRD. - would not pursue further inpatient ischemic evaluation at that time. - continue Plavix, Imdur, and BB.   6. History of SVT - no known recurrence this admission. Telemetry reviewed with no atopic events noted. - continue Amiodarone and BB.  Consider decreasing Amiodarone from 200mg  BID to 200mg  daily, for she has been on this dose since at least 06/2014.  7. ESRD - HD Tuesday, Thursday, Saturday schedule. - Nephrology following.  8. Hyperkalemia - K+ 6.0 on admission. Improved to 4.3 on 04/03/2015. - continue to monitor.   Signed, Erma Heritage , PA-C 7:43 AM 04/03/2015 Pager: [phone]   I have seen, examined and evaluated the patient this AM along with Mrs. Ahmed Prima, [place].  After reviewing all the available data and chart,  I agree with her findings, examination as well as impression recommendations.  60 year old woman with end-stage renal disease and mild to moderate combined systolic and diastolic heart failure with history of PSVT. I do agree that we can probably reduce her amiodarone dose to once daily now.   She presumably sees a cardiologist in the [facility] system, but is also followed in heart failure. Unfortunately when I was talking to her, she was very sleepy because she got back to  to her room light last night.. She did not seem interested in talking to me. She simply was talking about how she takes too many medicines and is tired of taking lots of medicines. Based on this discussion, I don't think she is a good candidate for trying to intermittent diuresis on nondialysis days. Were probably at a point of needing to control her volume status from only. Continued education as far as volume management is vital.  Perhaps when she is feeling a little less lethargic she would be excision discussed possibility of intermittent diuretics. For now she seems to be relatively tired taking oral medications, and that may be one potential problem with medication adherence.  She seems relatively stable overall however as she is lying flat in bed with no sign of dyspnea. Her blood pressure seems to be somewhat labile, probably having more to do with her on her off days of dialysis. Will defer blood pressure management to her  nephrologist.  Elevated troponin in the setting a residual disease is probably simply related to chronic disease and not any recurrent ischemia.  On discharge being to ensure that she does truly have cardiac follow-up at [facility].  We will sign off for now please don't hesitate to contact us if you have any questions.   Leonie Man, M.D., M.S. Interventional Cardiologist   Pager # [phone] Phone # [phone] [address] [place], [place] [address]

## 2015-04-03 NOTE — Care Management (Signed)
Patient admitted with acute respiratory failure.  Patient is well known to  [facility] team.  Patient in a deep sleep at this time, and unable to complete full assessment.  Patient lives at home alone in an apartment.  Patient adult son lives near by for support.  Patient has chronic home O2 through [facility]. Patient also has a walker at home.  Patient would qualify for [facility] home health services.  She declined last admission.  RNCM consult placed due to issues obtaining nebulizer medication. I have contacted [facility].  Representative states that Dubonebs were mailed out to the patient 03/21/15 at no cost.  Albuterol refill in process 04/03/15 at no cost.  Representative state that their system does not indicated that patient has filled her Advair or Spiriva through them.  When representative attempted to refill Advair to get an out of pocket expense he received and error message "it's to soon to refill".  He suggested that she may have attempted to fill at a local facility.  When patient awakes I will inquire.

## 2015-04-03 NOTE — Plan of Care (Signed)
Problem: Safety: Goal: Ability to remain free from injury will improve Outcome: Progressing Pt refuses bed alarm.      

## 2015-04-03 NOTE — Progress Notes (Signed)
[place] [facility] [place] NAME: Kelsey Peterson    MR#:  [id number]  DATE OF BIRTH:  [birth year]  SUBJECTIVE:  CHIEF COMPLAINT:   Chief Complaint  Patient presents with  . Shortness of Breath  still SOB and cough, on O2 [place] 3L. Some improvement in shortness of breath with dialysis.  REVIEW OF SYSTEMS:  CONSTITUTIONAL: No fever, has weakness.  EYES: No blurred or double vision.  EARS, NOSE, AND THROAT: No tinnitus or ear pain. Hard hearing. RESPIRATORY: has cough, shortness of breath, no wheezing or hemoptysis.  CARDIOVASCULAR: No chest pain, orthopnea, edema.  GASTROINTESTINAL: No nausea, vomiting, diarrhea or abdominal pain.  GENITOURINARY: No dysuria, hematuria.  ENDOCRINE: No polyuria, nocturia,  HEMATOLOGY: No anemia, easy bruising or bleeding SKIN: No rash or lesion. MUSCULOSKELETAL: No joint pain or arthritis.   NEUROLOGIC: No tingling, numbness, weakness.  PSYCHIATRY: No anxiety or depression.   DRUG ALLERGIES:   Allergies  Allergen Reactions  . Contrast Media [Iodinated Diagnostic Agents] Other (See Comments)    Unknown reaction  . Morphine And Related Hives  . Other Rash    Blood Pressure Pill (Pt doesn't remember name)     VITALS:  Blood pressure 156/81, pulse 85, temperature 97.9 F (36.6 C), temperature source Oral, resp. rate 18, height 5\' 2"  (1.575 m), weight 76.703 kg (169 lb 1.6 oz), SpO2 94 %.  PHYSICAL EXAMINATION:  GENERAL:  60 y.o.-year-old patient lying in the bed with no acute distress.  EYES: Pupils equal, round, reactive to light and accommodation. No scleral icterus. Extraocular muscles intact.  HEENT: Head atraumatic, normocephalic. Oropharynx and nasopharynx clear.  NECK:  Supple, no jugular venous distention. No thyroid enlargement, no tenderness.  LUNGS: Normal breath sounds bilaterally, no wheezing, mild rales,no rhonchi or crepitation. No use of accessory muscles of respiration.   CARDIOVASCULAR: S1, S2 normal. No murmurs, rubs, or gallops.  ABDOMEN: Soft, nontender, nondistended. Bowel sounds present. No organomegaly or mass.  EXTREMITIES: bilateral leg edema 1+, no cyanosis, or clubbing.  NEUROLOGIC: Cranial nerves II through XII are intact. Muscle strength 5/5 in all extremities. Sensation intact. Gait not checked.  PSYCHIATRIC: The patient is alert and oriented x 3.  SKIN: No obvious rash, lesion, or ulcer.    LABORATORY PANEL:   CBC  Recent Labs Lab 04/02/15 0338  WBC 6.2  HGB 8.3*  HCT 25.6*  PLT 300   ------------------------------------------------------------------------------------------------------------------  Chemistries   Recent Labs Lab 04/01/15 2303 04/02/15 0338 04/03/15 0521  NA 135 137 136  K 6.0* 5.1 4.3  CL 92* 95* 97*  CO2 17* 21* 29  GLUCOSE 46* 35* 69  BUN 56* 60* 31*  CREATININE 7.17* 7.28*  7.07* 4.77*  CALCIUM 9.7 9.5 8.1*  MG  --  2.4  --   AST 54*  --   --   ALT 28  --   --   ALKPHOS 101  --   --   BILITOT 3.1*  --   --    ------------------------------------------------------------------------------------------------------------------  Cardiac Enzymes  Recent Labs Lab 04/02/15 1538  TROPONINI 0.07*   ------------------------------------------------------------------------------------------------------------------  RADIOLOGY:  Dg Chest Portable 1 View  04/02/2015  CLINICAL DATA:  Shortness of breath EXAM: PORTABLE CHEST 1 VIEW COMPARISON:  02/05/2015 FINDINGS: Chronic cardiopericardial enlargement and vascular pedicle widening. The aorta is tortuous. Pulmonary arteries are enlarged, as confirmed on 2016 chest CT. There is a recurrent moderate left pleural effusion. Diffuse interstitial coarsening. IMPRESSION: 1. Re- accumulated left pleural effusion, moderate  volume. 2. Pulmonary edema and chronic cardiomegaly. 3. Pulmonary hypertension. Electronically Signed   By: Monte Fantasia M.D.   On: 04/02/2015  00:05    EKG:   Orders placed or performed during the hospital encounter of 04/01/15  . EKG 12-Lead  . EKG 12-Lead    ASSESSMENT AND PLAN:   # Acute respiratory failure with hypoxia due to acute on chronic systolic CHF. Due to dietary noncompliance Chest x-ray showed pulmonary edema and left pleural effusion. Off BiPAP and now on nasal cannula. Will need further hemodialysis for fluid removal. Will repeat chest x-ray after dialysis. May need left thoracentesis if has persistent pleural effusion.  # End stage renal disease on dialysis  -continue HD. Hyperkalemia has resolved  # Diabetes mellitus type 2 Not on medications per Will add sliding scale insulin.  # COPD (chronic obstructive pulmonary disease) continue home inhalers, and duo nebs when necessary.   # HTN (hypertension) - controlled, continue home meds  # CAD (coronary artery disease) - continue home meds, stable cardiac enzymes. Very minimal elevation of troponin  # GERD (gastroesophageal reflux disease) - home dose PPI  #DVT prophylaxis with heparin  All the records are reviewed and case discussed with Care Management/Social Workerr. Management plans discussed with the patient, her son and they are in agreement.  CODE STATUS: full code.  TOTAL TIME TAKING CARE OF THIS PATIENT: 35 minutes.   POSSIBLE D/C IN 2-3 DAYS, DEPENDING ON CLINICAL CONDITION.   Hillary Bow R M.D on 04/03/2015 at 12:46 PM  Between 7am to 6pm - Pager - [phone]  After 6pm go to [email or web address] - password [email or web address]  Office  [phone]  CC: Primary care physician; Lavera Guise, MD

## 2015-04-03 NOTE — Progress Notes (Signed)
MD Sudini notified pt will not allow CBG sticks, he stopped SSI and asked for one time CBG, which patient consented to, it was 131.  She also refused heparin subQ, MD made aware, no further orders at this time. Marland Kitchen

## 2015-04-04 ENCOUNTER — Inpatient Hospital Stay: Payer: Medicare Other

## 2015-04-04 LAB — MISC LABCORP TEST (SEND OUT): Labcorp test code: 1453

## 2015-04-04 LAB — BASIC METABOLIC PANEL
ANION GAP: 13 (ref 5–15)
BUN: 37 mg/dL — ABNORMAL HIGH (ref 6–20)
CALCIUM: 8 mg/dL — AB (ref 8.9–10.3)
CHLORIDE: 96 mmol/L — AB (ref 101–111)
CO2: 27 mmol/L (ref 22–32)
Creatinine, Ser: 6.06 mg/dL — ABNORMAL HIGH (ref 0.44–1.00)
GFR calc Af Amer: 8 mL/min — ABNORMAL LOW (ref >60)
GFR calc non Af Amer: 7 mL/min — ABNORMAL LOW (ref >60)
GLUCOSE: 95 mg/dL (ref 65–99)
POTASSIUM: 4.7 mmol/L (ref 3.5–5.1)
Sodium: 136 mmol/L (ref 135–145)

## 2015-04-04 LAB — HEPATITIS B SURFACE ANTIBODY, QUANTITATIVE: HEPATITIS B-POST: 62.4 m[IU]/mL

## 2015-04-04 LAB — PHOSPHORUS: Phosphorus: 5.7 mg/dL — ABNORMAL HIGH (ref 2.5–4.6)

## 2015-04-04 LAB — HEPATITIS B SURFACE ANTIGEN: HEP B S AG: NEGATIVE

## 2015-04-04 LAB — MAGNESIUM: Magnesium: 2.2 mg/dL (ref 1.7–2.4)

## 2015-04-04 MED ORDER — ADVAIR DISKUS 250-50 MCG/DOSE IN AEPB: 1.0000 | INHALATION_SPRAY | Freq: Two times a day (BID) | RESPIRATORY_TRACT | Status: DC

## 2015-04-04 MED ORDER — IPRATROPIUM-ALBUTEROL 0.5-2.5 (3) MG/3ML IN SOLN: 3.0000 mL | Freq: Four times a day (QID) | RESPIRATORY_TRACT | Status: DC | PRN

## 2015-04-04 MED ORDER — AMIODARONE HCL 200 MG PO TABS: 200.0000 mg | ORAL_TABLET | Freq: Every day | ORAL | Status: AC

## 2015-04-04 MED ORDER — ALBUTEROL SULFATE HFA 108 (90 BASE) MCG/ACT IN AERS: 2.0000 | INHALATION_SPRAY | Freq: Four times a day (QID) | RESPIRATORY_TRACT | Status: AC | PRN

## 2015-04-04 MED ORDER — FLUTICASONE-SALMETEROL 250-50 MCG/DOSE IN AEPB: 1.0000 | INHALATION_SPRAY | Freq: Two times a day (BID) | RESPIRATORY_TRACT | Status: AC

## 2015-04-04 MED ORDER — SALINE SPRAY 0.65 % NA SOLN
1.0000 | NASAL | Status: DC | PRN
  Administered 2015-04-04: 1 via NASAL
  Filled 2015-04-04: qty 44

## 2015-04-04 NOTE — Progress Notes (Signed)
Post hd tx 

## 2015-04-04 NOTE — Progress Notes (Signed)
Hemodialysis start 

## 2015-04-04 NOTE — Progress Notes (Signed)
Hemodialysis completed. 

## 2015-04-04 NOTE — Progress Notes (Signed)
MEDICATION RELATED CONSULT NOTE - FOLLOW UP   Pharmacy Consult for Electrolytes Indication:   Allergies  Allergen Reactions  . Contrast Media [Iodinated Diagnostic Agents] Other (See Comments)    Unknown reaction  . Morphine And Related Hives  . Other Rash    Blood Pressure Pill (Pt doesn't remember name)     Patient Measurements: Height: 5\' 2"  (157.5 cm) Weight: 164 lb 3.2 oz (74.481 kg) IBW/kg (Calculated) : 50.1 Adjusted Body Weight:   Vital Signs: Temp: 98.2 F (36.8 C) (03/16 0441) BP: 148/74 mmHg (03/16 0441) Pulse Rate: 62 (03/16 0441) Intake/Output from previous day: 03/15 0701 - 03/16 0700 In: 600 [P.O.:600] Out: 0  Intake/Output from this shift:    Labs:  Recent Labs  04/01/15 2303 04/02/15 0338 04/03/15 0521 04/04/15 0530  WBC 6.0 6.2  --   --   HGB 9.3* 8.3*  --   --   HCT 28.8* 25.6*  --   --   PLT 352 300  --   --   CREATININE 7.17* 7.28*  7.07* 4.77* 6.06*  MG  --  2.4  --  2.2  PHOS  --  8.4*  --  5.7*  ALBUMIN 4.1  --   --   --   PROT 8.7*  --   --   --   AST 54*  --   --   --   ALT 28  --   --   --   ALKPHOS 101  --   --   --   BILITOT 3.1*  --   --   --    Lab Results  Component Value Date   K 4.7 04/04/2015   Estimated Creatinine Clearance: 9.3 mL/min (by C-G formula based on Cr of 6.06).    Medications:  Scheduled:  . amiodarone  200 mg Oral Daily  . amLODipine  5 mg Oral Daily  . antiseptic oral rinse  7 mL Mouth Rinse q12n4p  . calcium acetate  1,334 mg Oral Q breakfast  . chlorhexidine  15 mL Mouth Rinse BID  . cinacalcet  60 mg Oral Q breakfast  . clopidogrel  75 mg Oral Daily  . epoetin (EPOGEN/PROCRIT) injection  10,000 Units Intravenous Q T,Th,Sa-HD  . Ferrous Fumarate  1 tablet Oral Daily  . furosemide  80 mg Oral BID  . heparin  5,000 Units Subcutaneous 3 times per day  . isosorbide mononitrate  30 mg Oral Daily  . metoprolol tartrate  12.5 mg Oral BID  . mometasone-formoterol  2 puff Inhalation BID  .  pantoprazole  40 mg Oral Daily  . sodium chloride flush  3 mL Intravenous Q12H  . tiotropium  1 capsule Inhalation Daily    Assessment: CR is a 60yo female admitted 04/01/15 for SOB, N/V 2/2 electrolyte disturbances and pleural effusion.  Pt is on Hemodialysis TTS at home.  Goal of Therapy:  Electrolytes WNL  Plan:  3/15: K= 4.7   Mag=2.2,  Phos= 5.7 (elevated but trending down) No supplementation. F/U K+ in am. Note: Patient on Furosemide 80mg  po bid.  Karesa Maultsby A 04/04/2015,8:20 AM

## 2015-04-04 NOTE — Discharge Instructions (Addendum)
°  DIET:  Renal diet  DISCHARGE CONDITION:  Stable  ACTIVITY:  Activity as tolerated  OXYGEN:  Home Oxygen: Yes.     Oxygen Delivery: 2 liters/min via Patient connected to nasal cannula oxygen  DISCHARGE LOCATION:  home   If you experience worsening of your admission symptoms, develop shortness of breath, life threatening emergency, suicidal or homicidal thoughts you must seek medical attention immediately by calling 911 or calling your MD immediately  if symptoms less severe.  You Must read complete instructions/literature along with all the possible adverse reactions/side effects for all the Medicines you take and that have been prescribed to you. Take any new Medicines after you have completely understood and accpet all the possible adverse reactions/side effects.   Daily fluids < 1.5 liters

## 2015-04-04 NOTE — Care Management Important Message (Signed)
Important Message  Patient Details  Name: Kelsey Peterson MRN: [id number] Date of Birth: 04/11/1955   Medicare Important Message Given:  Yes    [name] [name] 04/04/2015, 11:18 AM

## 2015-04-04 NOTE — Progress Notes (Signed)
Pre-hd tx 

## 2015-04-04 NOTE — Care Management (Signed)
Follow up with patient.  She is awake at this time.  Patient expresses concerns that she does not have her nebulizer medication.  She is unable to tell me the name of the medication, or pharmacies that she has attempted to fill them at.  Patient informed that [facility] states they have filled her douneb medication 03/21/15.  Patient states that she already has Advair inahler and albuterol inhaler in the home.  Patient has followup appointment with pulmonolgy Dr. Alva Garnet on 04/15/15 for a second opinion.    Patient has qualified for [facility] home health services in the past.  I attempted to discuss these services with the patient.  She loudly stated "I do not want any of them coming".  No further resources that I can provide to patient.

## 2015-04-04 NOTE — Progress Notes (Signed)
Initial Nutrition Assessment   INTERVENTION:   Meals and Snacks: Cater to patient preferences on Renal Diet order, pt may benefit from Renal/Carb Modified diet order Medical Food Supplement Therapy: if po intake inadequate on follow, will recommend addition of Nepro daily   NUTRITION DIAGNOSIS:   Altered nutrition lab value related to chronic illness as evidenced by  (Electrolyte and renal Profile).  GOAL:   Patient will meet greater than or equal to 90% of their needs  MONITOR:   PO intake, Labs, Weight trends, I & O's  REASON FOR ASSESSMENT:    (Dialysis Pt)    ASSESSMENT:    Pt admitted with acute respiratory failure with hypoxia. Pt with h/o ESRD on HD, 3L removed yesterday via HD per documentation.  Past Medical History  Diagnosis Date  . COPD (chronic obstructive pulmonary disease) ([place])   . ESRD on hemodialysis ([place])   . DM2 (diabetes mellitus, type 2) ([place])   . Hepatitis C   . Migraine with aura   . HOH (hard of hearing)   . Mitral regurgitation     a. calcified mitral annulus, mod MR by echo in 06/2014  . Cognitive impairment   . CAD (coronary artery disease)   . HTN (hypertension)   . Chronic systolic CHF (congestive heart failure) (HCC)     a. EF 45% by echo in 06/2014  . Paroxysmal SVT (supraventricular tachycardia) (HCC)     a. history of this, with last known occurrence in 06/2014     Diet Order:  Diet renal with fluid restriction Fluid restriction:: 1200 mL Fluid; Room service appropriate?: Yes; Fluid consistency:: Thin    Current Nutrition: Recorded po intake 62% of meals on average.   Food/Nutrition-Related History: Per MST no decrease in appetite PTA.   Scheduled Medications:  . amiodarone  200 mg Oral Daily  . amLODipine  5 mg Oral Daily  . antiseptic oral rinse  7 mL Mouth Rinse q12n4p  . calcium acetate  1,334 mg Oral Q breakfast  . chlorhexidine  15 mL Mouth Rinse BID  . cinacalcet  60 mg Oral Q breakfast  . clopidogrel  75 mg  Oral Daily  . epoetin (EPOGEN/PROCRIT) injection  10,000 Units Intravenous Q T,Th,Sa-HD  . Ferrous Fumarate  1 tablet Oral Daily  . furosemide  80 mg Oral BID  . heparin  5,000 Units Subcutaneous 3 times per day  . isosorbide mononitrate  30 mg Oral Daily  . metoprolol tartrate  12.5 mg Oral BID  . mometasone-formoterol  2 puff Inhalation BID  . pantoprazole  40 mg Oral Daily  . sodium chloride flush  3 mL Intravenous Q12H  . tiotropium  1 capsule Inhalation Daily     Electrolyte/Renal Profile and Glucose Profile:   Recent Labs Lab 04/02/15 0338 04/03/15 0521 04/04/15 0530  NA 137 136 136  K 5.1 4.3 4.7  CL 95* 97* 96*  CO2 21* 29 27  BUN 60* 31* 37*  CREATININE 7.28*  7.07* 4.77* 6.06*  CALCIUM 9.5 8.1* 8.0*  MG 2.4  --  2.2  PHOS 8.4*  --  5.7*  GLUCOSE 35* 69 95   Protein Profile:   Recent Labs Lab 04/01/15 2303  ALBUMIN 4.1    Gastrointestinal Profile: Last BM:  04/01/2015   Nutrition-Focused Physical Exam Findings:  Unable to complete Nutrition-Focused physical exam at this time.    Weight Change: s/p HD 3/16 with 3L fluid removed Filed Weights   04/03/15 0601 04/04/15 0441 04/04/15 1010  Weight: 169 lb 1.6 oz (76.703 kg) 164 lb 3.2 oz (74.481 kg) 164 lb 7.4 oz (74.6 kg)     Height:   Ht Readings from Last 1 Encounters:  04/01/15 5\' 2"  (1.575 m)    Weight:   Wt Readings from Last 1 Encounters:  04/04/15 164 lb 7.4 oz (74.6 kg)   Wt Readings from Last 10 Encounters:  04/04/15 164 lb 7.4 oz (74.6 kg)  03/25/15 176 lb (79.833 kg)  02/27/15 140 lb (63.504 kg)  02/27/15 174 lb (78.926 kg)  02/06/15 140 lb 4.8 oz (63.64 kg)  12/06/14 190 lb 0.6 oz (86.2 kg)  11/02/14 185 lb (83.915 kg)  09/04/14 195 lb 15.8 oz (88.9 kg)  07/16/14 206 lb 3.8 oz (93.548 kg)  07/10/14 192 lb 3.9 oz (87.2 kg)    BMI:  Body mass index is 30.07 kg/(m^2).  Estimated Nutritional Needs:   Kcal:  using IBW of 50kg, BEE: 1024kcals, TEE: (IF 1.1-1.3)(AF 1.3)  1463-1730kcals  Protein:  60-75g protein (1.2-1.5g/kg)  Fluid:  UOP+1L  EDUCATION NEEDS:   Education needs no appropriate at this time as pt out of room  [place], RD, LDN Pager [phone] Weekend/On-Call Pager [phone]

## 2015-04-04 NOTE — Progress Notes (Signed)
Subjective:  Patient known to our practice from outpatient dialysis She presents from home for SOB Breathing much better after 3 L removed with dialysis   Anticipating discharge today Reports sinus congestion   Objective:  Vital signs in last 24 hours:  Temp:  [97.9 F (36.6 C)-98.5 F (36.9 C)] 98.4 F (36.9 C) (03/16 1010) Pulse Rate:  [59-85] 61 (03/16 1100) Resp:  [13-20] 15 (03/16 1100) BP: (126-164)/(66-90) 164/90 mmHg (03/16 1100) SpO2:  [88 %-98 %] 96 % (03/16 1100) Weight:  [74.481 kg (164 lb 3.2 oz)-74.6 kg (164 lb 7.4 oz)] 74.6 kg (164 lb 7.4 oz) (03/16 1010)  Weight change: -2.319 kg (-5 lb 1.8 oz) Filed Weights   04/03/15 0601 04/04/15 0441 04/04/15 1010  Weight: 76.703 kg (169 lb 1.6 oz) 74.481 kg (164 lb 3.2 oz) 74.6 kg (164 lb 7.4 oz)    Intake/Output:    Intake/Output Summary (Last 24 hours) at 04/04/15 1138 Last data filed at 04/04/15 0900  Gross per 24 hour  Intake    120 ml  Output      0 ml  Net    120 ml     Physical Exam: General: NAD  HEENT Decreased hearing, mouth dry  Neck supple  Pulm/lungs Clear today, normal effort, [place] oxygen  CVS/Heart Regular, no rub  Abdomen:  Soft, NT  Extremities: + dependent edema  Neurologic: Alert, follows commands  Skin: Warm, dry  Access: AVF       Basic Metabolic Panel:   Recent Labs Lab 04/01/15 2303 04/02/15 0338 04/03/15 0521 04/04/15 0530  NA 135 137 136 136  K 6.0* 5.1 4.3 4.7  CL 92* 95* 97* 96*  CO2 17* 21* 29 27  GLUCOSE 46* 35* 69 95  BUN 56* 60* 31* 37*  CREATININE 7.17* 7.28*  7.07* 4.77* 6.06*  CALCIUM 9.7 9.5 8.1* 8.0*  MG  --  2.4  --  2.2  PHOS  --  8.4*  --  5.7*     CBC:  Recent Labs Lab 04/01/15 2303 04/02/15 0338  WBC 6.0 6.2  HGB 9.3* 8.3*  HCT 28.8* 25.6*  MCV 79.8* 80.2  PLT 352 300      Microbiology:  Recent Results (from the past 720 hour(s))  MRSA PCR Screening     Status: None   Collection Time: 04/02/15  3:32 AM  Result Value Ref Range  Status   MRSA by PCR NEGATIVE NEGATIVE Final    Comment:        The GeneXpert MRSA Assay (FDA approved for NASAL specimens only), is one component of a comprehensive MRSA colonization surveillance program. It is not intended to diagnose MRSA infection nor to guide or monitor treatment for MRSA infections.     Coagulation Studies: No results for input(s): LABPROT, INR in the last 72 hours.  Urinalysis: No results for input(s): COLORURINE, LABSPEC, PHURINE, GLUCOSEU, HGBUR, BILIRUBINUR, KETONESUR, PROTEINUR, UROBILINOGEN, NITRITE, LEUKOCYTESUR in the last 72 hours.  Invalid input(s): APPERANCEUR    Imaging: No results found.   Medications:     . amiodarone  200 mg Oral Daily  . amLODipine  5 mg Oral Daily  . antiseptic oral rinse  7 mL Mouth Rinse q12n4p  . calcium acetate  1,334 mg Oral Q breakfast  . chlorhexidine  15 mL Mouth Rinse BID  . cinacalcet  60 mg Oral Q breakfast  . clopidogrel  75 mg Oral Daily  . epoetin (EPOGEN/PROCRIT) injection  10,000 Units Intravenous Q T,Th,Sa-HD  .  Ferrous Fumarate  1 tablet Oral Daily  . furosemide  80 mg Oral BID  . heparin  5,000 Units Subcutaneous 3 times per day  . isosorbide mononitrate  30 mg Oral Daily  . metoprolol tartrate  12.5 mg Oral BID  . mometasone-formoterol  2 puff Inhalation BID  . pantoprazole  40 mg Oral Daily  . sodium chloride flush  3 mL Intravenous Q12H  . tiotropium  1 capsule Inhalation Daily   acetaminophen **OR** acetaminophen, ipratropium-albuterol, labetalol, lidocaine-prilocaine, ondansetron **OR** ondansetron (ZOFRAN) IV, sodium chloride  Assessment/ Plan:  60 y.o. 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   1. End-stage renal disease on hemodialysis Tuesday, Thursday, Saturday.  - routine dialysis today   2. Pulmonary edema/left pleural effusion/shortness of breath. Status post left thoracentesis yielding 1.1 L of pleural fluid 02/05/15. -  Continue ultrafiltration with dialysis as tolerated -   Continue furosemide. Increase to twice daily on non dialysis days - improved after volume removal  3. Anemia of chronic kidney disease.   epogen 10000 units IV with HD.  Oral iron- patient is allergic to iv iron  4. Secondary hyperparathyroidism.   continue to monitor.  PhosLo     LOS: 2 Jettson Crable 3/16/201711:38 AM

## 2015-04-08 NOTE — Discharge Summary (Signed)
[facility] NAME: Kelsey Peterson    MR#:  [id number]  DATE OF BIRTH:  1955-11-11  DATE OF ADMISSION:  04/01/2015 ADMITTING PHYSICIAN: Lance Coon, MD  DATE OF DISCHARGE: 04/04/2015  5:25 PM  PRIMARY CARE PHYSICIAN: No primary care provider on file.   ADMISSION DIAGNOSIS:  Hyperkalemia [E87.5] Dyspnea [R06.00] Hypoglycemia [E16.2] Pleural effusion [J90]  DISCHARGE DIAGNOSIS:  Principal Problem:   Acute respiratory failure with hypoxia (HCC) Active Problems:   End stage renal disease on dialysis (HCC)   GERD (gastroesophageal reflux disease)   COPD (chronic obstructive pulmonary disease) (HCC)   HTN (hypertension)   CAD (coronary artery disease)   Hyperkalemia   Fluid overload   Acute on chronic systolic heart failure (HCC)   Pleural effusion, left   SECONDARY DIAGNOSIS:   Past Medical History  Diagnosis Date  . COPD (chronic obstructive pulmonary disease) ([place])   . ESRD on hemodialysis ([place])   . DM2 (diabetes mellitus, type 2) ([place])   . Hepatitis C   . Migraine with aura   . HOH (hard of hearing)   . Mitral regurgitation     a. calcified mitral annulus, mod MR by echo in 06/2014  . Cognitive impairment   . CAD (coronary artery disease)   . HTN (hypertension)   . Chronic systolic CHF (congestive heart failure) (HCC)     a. EF 45% by echo in 06/2014  . Paroxysmal SVT (supraventricular tachycardia) (HCC)     a. history of this, with last known occurrence in 06/2014     ADMITTING HISTORY  Kelsey Peterson is a 60 y.o. female who presents with acute onset dyspnea. Patient states she was about her usual activities when she became acutely short of breath this evening. She is an end-stage renal disease patient on dialysis. She states that her last dialysis session was this past Saturday, and on schedule. Evaluation in the ED she was found to have significant pulmonary edema on chest x-ray as well as recurrence  of a left pleural effusion. She was also found to be hypertensive, hyperkalemic, and with an elevated troponin is 0.08. She was placed on BiPAP in the ED with moderate improvement in her respiratory symptoms and good improvement in her oxygenation. Hospitalists were called for admission.  HOSPITAL COURSE:   # Acute respiratory failure with hypoxia due to acute on chronic systolic CHF. Due to dietary noncompliance Chest x-ray showed pulmonary edema and left pleural effusion. Off BiPAP and now on nasal cannula. Chest x-ray repeated on the discharge and pulmonary edema has resolved. Left pleural effusion is significantly improved. Patient will continue her dialysis as outpatient. Counseled on limiting fluids.  # End stage renal disease on dialysis -continue HD. Hyperkalemia has resolved  # Diabetes mellitus type 2 This was mentioned in her history. HbA1c was checked and normal. Patient is not on medications at home.  # COPD (chronic obstructive pulmonary disease) continue home inhalers, and duo nebs when necessary.  Patient was given prescriptions for Advair, DuoNeb and albuterol inhaler which was sent to [facility] on [address].  # HTN (hypertension) - controlled, continue home meds  # CAD (coronary artery disease) - continue home meds, stable cardiac enzymes. Very minimal elevation of troponin  # GERD (gastroesophageal reflux disease) - home dose PPI  #DVT prophylaxis with heparin  Patient is back to baseline with multiple rounds of dialysis in the hospital. Follow-up with primary care physician and continue dialysis as  before. Patient needs to limit her salt and fluid intake.  Discussed with son on the phone regarding discharge plan and further follow-up.  CONSULTS OBTAINED:  Treatment Team:  Demetrios Loll, MD Murlean Iba, MD  DRUG ALLERGIES:   Allergies  Allergen Reactions  . Contrast Media [Iodinated Diagnostic Agents] Other (See Comments)    Unknown reaction  .  Morphine And Related Hives  . Other Rash    Blood Pressure Pill (Pt doesn't remember name)     DISCHARGE MEDICATIONS:   Discharge Medication List as of 04/04/2015  4:55 PM    CONTINUE these medications which have CHANGED   Details  albuterol (PROVENTIL HFA;VENTOLIN HFA) 108 (90 Base) MCG/ACT inhaler Inhale 2 puffs into the lungs every 6 (six) hours as needed for wheezing or shortness of breath., Starting 04/04/2015, Until Discontinued, Normal    amiodarone (PACERONE) 200 MG tablet Take 1 tablet (200 mg total) by mouth daily., Starting 04/04/2015, Until Discontinued, No Print    Fluticasone-Salmeterol (ADVAIR DISKUS) 250-50 MCG/DOSE AEPB Inhale 1 puff into the lungs 2 (two) times daily., Starting 04/04/2015, Until Discontinued, Normal    ipratropium-albuterol (DUONEB) 0.5-2.5 (3) MG/3ML SOLN Take 3 mLs by nebulization 4 (four) times daily as needed., Starting 04/04/2015, Until Discontinued, Normal      CONTINUE these medications which have NOT CHANGED   Details  amLODipine (NORVASC) 5 MG tablet Take 5 mg by mouth daily., Until Discontinued, Historical Med    calcium acetate (PHOSLO) 667 MG capsule Take 2 capsules by mouth 3 (three) times daily with meals. And one capsule with snakes, Starting 10/05/2014, Until Discontinued, Historical Med    cinacalcet (SENSIPAR) 60 MG tablet Take 60 mg by mouth daily., Until Discontinued, Historical Med    clopidogrel (PLAVIX) 75 MG tablet Take 75 mg by mouth daily., Starting 08/31/2014, Until Discontinued, Historical Med    esomeprazole (NEXIUM) 40 MG capsule Take 40 mg by mouth daily. , Until Discontinued, Historical Med    folic acid-vitamin b complex-vitamin c-selenium-zinc (DIALYVITE) 3 MG TABS tablet Take 1 tablet by mouth daily., Until Discontinued, Historical Med    furosemide (LASIX) 80 MG tablet Take 80 mg by mouth daily. Patient takes on non dialysis days., Until Discontinued, Historical Med    isosorbide mononitrate (IMDUR) 30 MG 24 hr tablet  Take 30 mg by mouth daily., Until Discontinued, Historical Med    metoprolol tartrate (LOPRESSOR) 25 MG tablet Take 12.5 mg by mouth 2 (two) times daily., Starting 10/21/2014, Until Discontinued, Historical Med    ondansetron (ZOFRAN) 4 MG tablet Take 4 mg by mouth every 8 (eight) hours as needed for nausea or vomiting. Reported on 02/05/2015, Until Discontinued, Historical Med    SPIRIVA HANDIHALER 18 MCG inhalation capsule Place 1 capsule into inhaler and inhale daily., Starting 03/03/2015, Until Discontinued, Historical Med        Today   VITAL SIGNS:  Blood pressure 175/81, pulse 71, temperature 97.3 F (36.3 C), temperature source Oral, resp. rate 16, height 5\' 2"  (1.575 m), weight 72.5 kg (159 lb 13.3 oz), SpO2 99 %.  I/O:  No intake or output data in the 24 hours ending 04/08/15 1608  PHYSICAL EXAMINATION:  Physical Exam  GENERAL:  60 y.o.-year-old patient lying in the bed with no acute distress. Decreased hearing LUNGS: Normal breath sounds bilaterally, no wheezing, rales,rhonchi or crepitation. No use of accessory muscles of respiration.  CARDIOVASCULAR: S1, S2 normal. No murmurs, rubs, or gallops.  ABDOMEN: Soft, non-tender, non-distended. Bowel sounds present. No organomegaly or mass.  NEUROLOGIC: Moves all 4 extremities. PSYCHIATRIC: The patient is alert and oriented x 3.  SKIN: No obvious rash, lesion, or ulcer.   DATA REVIEW:   CBC  Recent Labs Lab 04/02/15 0338  WBC 6.2  HGB 8.3*  HCT 25.6*  PLT 300    Chemistries   Recent Labs Lab 04/01/15 2303  04/04/15 0530  NA 135  < > 136  K 6.0*  < > 4.7  CL 92*  < > 96*  CO2 17*  < > 27  GLUCOSE 46*  < > 95  BUN 56*  < > 37*  CREATININE 7.17*  < > 6.06*  CALCIUM 9.7  < > 8.0*  MG  --   < > 2.2  AST 54*  --   --   ALT 28  --   --   ALKPHOS 101  --   --   BILITOT 3.1*  --   --   < > = values in this interval not displayed.  Cardiac Enzymes  Recent Labs Lab 04/02/15 1538  TROPONINI 0.07*     Microbiology Results  Results for orders placed or performed during the hospital encounter of 04/01/15  MRSA PCR Screening     Status: None   Collection Time: 04/02/15  3:32 AM  Result Value Ref Range Status   MRSA by PCR NEGATIVE NEGATIVE Final    Comment:        The GeneXpert MRSA Assay (FDA approved for NASAL specimens only), is one component of a comprehensive MRSA colonization surveillance program. It is not intended to diagnose MRSA infection nor to guide or monitor treatment for MRSA infections.     RADIOLOGY:  No results found.  Follow up with PCP in 1 week.  Management plans discussed with the patient, family and they are in agreement.  CODE STATUS:  Code Status History    Date Active Date Inactive Code Status Order ID Comments User Context   04/02/2015  3:21 AM 04/04/2015  8:25 PM Full Code [id number]  Lance Coon, MD Inpatient   02/05/2015  9:17 AM 02/08/2015  8:00 PM Full Code [id number]  Fritzi Mandes, MD Inpatient   12/01/2014  3:08 PM 12/07/2014  7:15 PM Full Code [id number]  Demetrios Loll, MD Inpatient   09/01/2014  5:57 PM 09/06/2014  2:09 PM Full Code [id number]  Demetrios Loll, MD ED   07/14/2014  5:17 AM 07/16/2014  6:40 PM Full Code [id number]  Lance Coon, MD Inpatient   07/03/2014  2:06 PM 07/10/2014  7:11 PM Full Code [id number]  Demetrios Loll, MD Inpatient   06/21/2014  1:35 PM 06/22/2014  4:33 PM Full Code [id number]  Max Sane, MD Inpatient      TOTAL TIME TAKING CARE OF THIS PATIENT ON DAY OF DISCHARGE: more than 30 minutes.   Hillary Bow R M.D on 04/08/2015 at 4:08 PM  Between 7am to 6pm - Pager - [phone]  After 6pm go to [email or web address] - password [email or web address] [facility]  [facility]  Office  [phone]  CC: Primary care physician; No primary care provider on file.  Note: This dictation was prepared with Dragon dictation along with smaller phrase technology. Any transcriptional errors that result from this process are unintentional.

## 2015-04-15 ENCOUNTER — Ambulatory Visit (INDEPENDENT_AMBULATORY_CARE_PROVIDER_SITE_OTHER): Payer: Medicare Other | Admitting: Pulmonary Disease

## 2015-04-15 ENCOUNTER — Encounter: Payer: Self-pay | Admitting: Pulmonary Disease

## 2015-04-15 VITALS — BP 132/86 | HR 84 | Ht 62.0 in | Wt 182.6 lb

## 2015-04-15 DIAGNOSIS — R0600 Dyspnea, unspecified: Secondary | ICD-10-CM | POA: Diagnosis not present

## 2015-04-15 DIAGNOSIS — I429 Cardiomyopathy, unspecified: Secondary | ICD-10-CM | POA: Diagnosis not present

## 2015-04-15 DIAGNOSIS — J811 Chronic pulmonary edema: Secondary | ICD-10-CM | POA: Diagnosis not present

## 2015-04-15 DIAGNOSIS — N186 End stage renal disease: Secondary | ICD-10-CM | POA: Diagnosis not present

## 2015-04-15 MED ORDER — IPRATROPIUM-ALBUTEROL 0.5-2.5 (3) MG/3ML IN SOLN: 3.0000 mL | Freq: Four times a day (QID) | RESPIRATORY_TRACT | Status: DC | PRN

## 2015-04-15 NOTE — Patient Instructions (Addendum)
Stop Spiriva Continue Advair for now I have confirmed the prescription for the medications to go into the nebulizer machine We will talk with [place] to see if we can get you a smaller oxygen tank/concentrator Follow up in 6-8 weeks with chest Xray

## 2015-04-16 ENCOUNTER — Other Ambulatory Visit: Payer: Self-pay

## 2015-04-16 MED ORDER — IPRATROPIUM-ALBUTEROL 0.5-2.5 (3) MG/3ML IN SOLN: 3.0000 mL | Freq: Four times a day (QID) | RESPIRATORY_TRACT | Status: DC | PRN

## 2015-04-18 ENCOUNTER — Emergency Department: Payer: Medicare Other

## 2015-04-18 ENCOUNTER — Inpatient Hospital Stay
Admission: EM | Admit: 2015-04-18 | Discharge: 2015-04-23 | DRG: 377 | Disposition: A | Discharge status: Home Health | Payer: Medicare Other | Attending: Internal Medicine | Admitting: Internal Medicine

## 2015-04-18 ENCOUNTER — Encounter: Payer: Self-pay | Admitting: Emergency Medicine

## 2015-04-18 DIAGNOSIS — I132 Hypertensive heart and chronic kidney disease with heart failure and with stage 5 chronic kidney disease, or end stage renal disease: Secondary | ICD-10-CM | POA: Diagnosis present

## 2015-04-18 DIAGNOSIS — D631 Anemia in chronic kidney disease: Secondary | ICD-10-CM | POA: Diagnosis present

## 2015-04-18 DIAGNOSIS — J449 Chronic obstructive pulmonary disease, unspecified: Secondary | ICD-10-CM | POA: Diagnosis present

## 2015-04-18 DIAGNOSIS — Z79899 Other long term (current) drug therapy: Secondary | ICD-10-CM

## 2015-04-18 DIAGNOSIS — K920 Hematemesis: Secondary | ICD-10-CM | POA: Diagnosis present

## 2015-04-18 DIAGNOSIS — Z885 Allergy status to narcotic agent status: Secondary | ICD-10-CM | POA: Diagnosis not present

## 2015-04-18 DIAGNOSIS — Z791 Long term (current) use of non-steroidal anti-inflammatories (NSAID): Secondary | ICD-10-CM | POA: Diagnosis not present

## 2015-04-18 DIAGNOSIS — Z7902 Long term (current) use of antithrombotics/antiplatelets: Secondary | ICD-10-CM

## 2015-04-18 DIAGNOSIS — D490 Neoplasm of unspecified behavior of digestive system: Secondary | ICD-10-CM

## 2015-04-18 DIAGNOSIS — K3189 Other diseases of stomach and duodenum: Secondary | ICD-10-CM | POA: Diagnosis present

## 2015-04-18 DIAGNOSIS — I340 Nonrheumatic mitral (valve) insufficiency: Secondary | ICD-10-CM | POA: Diagnosis present

## 2015-04-18 DIAGNOSIS — E869 Volume depletion, unspecified: Secondary | ICD-10-CM | POA: Diagnosis present

## 2015-04-18 DIAGNOSIS — E8770 Fluid overload, unspecified: Secondary | ICD-10-CM

## 2015-04-18 DIAGNOSIS — I10 Essential (primary) hypertension: Secondary | ICD-10-CM | POA: Diagnosis present

## 2015-04-18 DIAGNOSIS — Z91041 Radiographic dye allergy status: Secondary | ICD-10-CM

## 2015-04-18 DIAGNOSIS — H9190 Unspecified hearing loss, unspecified ear: Secondary | ICD-10-CM | POA: Diagnosis present

## 2015-04-18 DIAGNOSIS — E162 Hypoglycemia, unspecified: Secondary | ICD-10-CM | POA: Diagnosis present

## 2015-04-18 DIAGNOSIS — R778 Other specified abnormalities of plasma proteins: Secondary | ICD-10-CM | POA: Diagnosis present

## 2015-04-18 DIAGNOSIS — K5900 Constipation, unspecified: Secondary | ICD-10-CM | POA: Diagnosis not present

## 2015-04-18 DIAGNOSIS — N186 End stage renal disease: Secondary | ICD-10-CM | POA: Diagnosis present

## 2015-04-18 DIAGNOSIS — N2581 Secondary hyperparathyroidism of renal origin: Secondary | ICD-10-CM | POA: Diagnosis present

## 2015-04-18 DIAGNOSIS — K922 Gastrointestinal hemorrhage, unspecified: Secondary | ICD-10-CM | POA: Diagnosis present

## 2015-04-18 DIAGNOSIS — Z8249 Family history of ischemic heart disease and other diseases of the circulatory system: Secondary | ICD-10-CM | POA: Diagnosis not present

## 2015-04-18 DIAGNOSIS — Z8661 Personal history of infections of the central nervous system: Secondary | ICD-10-CM | POA: Diagnosis not present

## 2015-04-18 DIAGNOSIS — I5022 Chronic systolic (congestive) heart failure: Secondary | ICD-10-CM | POA: Diagnosis present

## 2015-04-18 DIAGNOSIS — Z992 Dependence on renal dialysis: Secondary | ICD-10-CM

## 2015-04-18 DIAGNOSIS — Z888 Allergy status to other drugs, medicaments and biological substances status: Secondary | ICD-10-CM

## 2015-04-18 DIAGNOSIS — E875 Hyperkalemia: Secondary | ICD-10-CM | POA: Diagnosis present

## 2015-04-18 DIAGNOSIS — K766 Portal hypertension: Secondary | ICD-10-CM | POA: Diagnosis present

## 2015-04-18 DIAGNOSIS — R52 Pain, unspecified: Secondary | ICD-10-CM

## 2015-04-18 DIAGNOSIS — I2510 Atherosclerotic heart disease of native coronary artery without angina pectoris: Secondary | ICD-10-CM | POA: Diagnosis present

## 2015-04-18 DIAGNOSIS — R7989 Other specified abnormal findings of blood chemistry: Secondary | ICD-10-CM

## 2015-04-18 HISTORY — DX: Anemia in other chronic diseases classified elsewhere: D63.8

## 2015-04-18 HISTORY — DX: Secondary hyperparathyroidism of renal origin: N25.81

## 2015-04-18 HISTORY — DX: Gastro-esophageal reflux disease without esophagitis: K21.9

## 2015-04-18 LAB — COMPREHENSIVE METABOLIC PANEL
ALK PHOS: 133 U/L — AB (ref 38–126)
ALT: 48 U/L (ref 14–54)
AST: 66 U/L — ABNORMAL HIGH (ref 15–41)
Albumin: 3.9 g/dL (ref 3.5–5.0)
Anion gap: 25 — ABNORMAL HIGH (ref 5–15)
BILIRUBIN TOTAL: 3.1 mg/dL — AB (ref 0.3–1.2)
BUN: 58 mg/dL — ABNORMAL HIGH (ref 6–20)
CALCIUM: 9.7 mg/dL (ref 8.9–10.3)
CO2: 16 mmol/L — ABNORMAL LOW (ref 22–32)
CREATININE: 7.27 mg/dL — AB (ref 0.44–1.00)
Chloride: 97 mmol/L — ABNORMAL LOW (ref 101–111)
GFR, EST AFRICAN AMERICAN: 6 mL/min — AB (ref >60)
GFR, EST NON AFRICAN AMERICAN: 5 mL/min — AB (ref >60)
Glucose, Bld: 24 mg/dL — CL (ref 65–99)
Potassium: 5.9 mmol/L — ABNORMAL HIGH (ref 3.5–5.1)
Sodium: 138 mmol/L (ref 135–145)
Total Protein: 8 g/dL (ref 6.5–8.1)

## 2015-04-18 LAB — CBC
HCT: 26 % — ABNORMAL LOW (ref 35.0–47.0)
HEMOGLOBIN: 8.5 g/dL — AB (ref 12.0–16.0)
MCH: 25.6 pg — AB (ref 26.0–34.0)
MCHC: 32.7 g/dL (ref 32.0–36.0)
MCV: 78.4 fL — AB (ref 80.0–100.0)
PLATELETS: 327 10*3/uL (ref 150–440)
RBC: 3.32 MIL/uL — AB (ref 3.80–5.20)
RDW: 21.3 % — ABNORMAL HIGH (ref 11.5–14.5)
WBC: 8 10*3/uL (ref 3.6–11.0)

## 2015-04-18 LAB — GLUCOSE, CAPILLARY
GLUCOSE-CAPILLARY: 118 mg/dL — AB (ref 65–99)
GLUCOSE-CAPILLARY: 13 mg/dL — AB (ref 65–99)
GLUCOSE-CAPILLARY: 82 mg/dL (ref 65–99)
Glucose-Capillary: 12 mg/dL — CL (ref 65–99)
Glucose-Capillary: 91 mg/dL (ref 65–99)

## 2015-04-18 LAB — TROPONIN I
Troponin I: 0.13 ng/mL — ABNORMAL HIGH (ref <0.031)
Troponin I: 0.15 ng/mL — ABNORMAL HIGH (ref <0.031)

## 2015-04-18 LAB — BRAIN NATRIURETIC PEPTIDE: B Natriuretic Peptide: >4500 pg/mL — ABNORMAL HIGH (ref 0.0–100.0)

## 2015-04-18 LAB — APTT: aPTT: 33 seconds (ref 24–36)

## 2015-04-18 LAB — PROTIME-INR
INR: 1.73
Prothrombin Time: 20.2 seconds — ABNORMAL HIGH (ref 11.4–15.0)

## 2015-04-18 MED ORDER — CALCIUM ACETATE (PHOS BINDER) 667 MG PO CAPS
1334.0000 mg | ORAL_CAPSULE | Freq: Three times a day (TID) | ORAL | Status: DC
  Administered 2015-04-19 – 2015-04-23 (×6): 1334 mg via ORAL
  Filled 2015-04-18 (×7): qty 2

## 2015-04-18 MED ORDER — CINACALCET HCL 30 MG PO TABS
60.0000 mg | ORAL_TABLET | Freq: Every day | ORAL | Status: DC
  Administered 2015-04-20 – 2015-04-23 (×2): 60 mg via ORAL
  Filled 2015-04-18 (×3): qty 2

## 2015-04-18 MED ORDER — SODIUM CHLORIDE 0.9 % IV SOLN
1.0000 g | Freq: Once | INTRAVENOUS | Status: AC
  Administered 2015-04-18: 1 g via INTRAVENOUS
  Filled 2015-04-18: qty 10

## 2015-04-18 MED ORDER — CLOPIDOGREL BISULFATE 75 MG PO TABS: 75.0000 mg | ORAL_TABLET | Freq: Every day | ORAL | Status: DC

## 2015-04-18 MED ORDER — DEXTROSE 50 % IV SOLN
25.0000 g | Freq: Once | INTRAVENOUS | Status: AC
  Administered 2015-04-18: 25 g via INTRAVENOUS

## 2015-04-18 MED ORDER — METOPROLOL TARTRATE 25 MG PO TABS
12.5000 mg | ORAL_TABLET | Freq: Two times a day (BID) | ORAL | Status: DC
  Administered 2015-04-19 – 2015-04-23 (×5): 12.5 mg via ORAL
  Filled 2015-04-18 (×8): qty 1

## 2015-04-18 MED ORDER — AMLODIPINE BESYLATE 5 MG PO TABS
5.0000 mg | ORAL_TABLET | Freq: Every day | ORAL | Status: DC
  Administered 2015-04-19 – 2015-04-23 (×4): 5 mg via ORAL
  Filled 2015-04-18 (×5): qty 1

## 2015-04-18 MED ORDER — SODIUM POLYSTYRENE SULFONATE 15 GM/60ML PO SUSP
15.0000 g | Freq: Once | ORAL | Status: AC
  Administered 2015-04-18: 15 g via ORAL
  Filled 2015-04-18: qty 60

## 2015-04-18 MED ORDER — IPRATROPIUM-ALBUTEROL 0.5-2.5 (3) MG/3ML IN SOLN: 3.0000 mL | RESPIRATORY_TRACT | Status: DC | PRN

## 2015-04-18 MED ORDER — DEXTROSE 10 % IV SOLN
INTRAVENOUS | Status: DC
  Administered 2015-04-18: via INTRAVENOUS

## 2015-04-18 MED ORDER — ISOSORBIDE MONONITRATE ER 30 MG PO TB24
30.0000 mg | ORAL_TABLET | Freq: Every day | ORAL | Status: DC
  Administered 2015-04-19 – 2015-04-23 (×4): 30 mg via ORAL
  Filled 2015-04-18 (×5): qty 1

## 2015-04-18 MED ORDER — AMIODARONE HCL 200 MG PO TABS
200.0000 mg | ORAL_TABLET | Freq: Every day | ORAL | Status: DC
  Administered 2015-04-19 – 2015-04-23 (×3): 200 mg via ORAL
  Filled 2015-04-18 (×5): qty 1

## 2015-04-18 MED ORDER — ONDANSETRON HCL 4 MG PO TABS: 4.0000 mg | ORAL_TABLET | Freq: Three times a day (TID) | ORAL | Status: DC | PRN

## 2015-04-18 MED ORDER — PANTOPRAZOLE SODIUM 40 MG IV SOLR
40.0000 mg | Freq: Two times a day (BID) | INTRAVENOUS | Status: DC
  Administered 2015-04-18 – 2015-04-23 (×8): 40 mg via INTRAVENOUS
  Filled 2015-04-18 (×8): qty 40

## 2015-04-18 MED ORDER — ALBUTEROL SULFATE (2.5 MG/3ML) 0.083% IN NEBU
3.0000 mL | INHALATION_SOLUTION | Freq: Four times a day (QID) | RESPIRATORY_TRACT | Status: DC | PRN
  Administered 2015-04-22: 3 mL via RESPIRATORY_TRACT
  Filled 2015-04-18: qty 3

## 2015-04-18 MED ORDER — DEXTROSE 50 % IV SOLN
INTRAVENOUS | Status: AC
  Administered 2015-04-18: 25 g via INTRAVENOUS
  Filled 2015-04-18: qty 50

## 2015-04-18 MED ORDER — HYDRALAZINE HCL 20 MG/ML IJ SOLN
10.0000 mg | Freq: Four times a day (QID) | INTRAMUSCULAR | Status: DC | PRN
  Filled 2015-04-18: qty 1

## 2015-04-18 MED ORDER — SODIUM CHLORIDE 0.9 % IV BOLUS (SEPSIS)
500.0000 mL | Freq: Once | INTRAVENOUS | Status: AC
  Administered 2015-04-18: 500 mL via INTRAVENOUS

## 2015-04-18 MED ORDER — FLUTICASONE FUROATE-VILANTEROL 200-25 MCG/INH IN AEPB
1.0000 | INHALATION_SPRAY | Freq: Every day | RESPIRATORY_TRACT | Status: DC
  Administered 2015-04-21 – 2015-04-23 (×2): 1 via RESPIRATORY_TRACT
  Filled 2015-04-18: qty 28

## 2015-04-18 NOTE — Progress Notes (Signed)
PULMONARY CONSULT NOTE  Requesting MD/Service: Unknown Date of initial consultation: 04/15/15 Reason for consultation: Dyspnea  PT PROFILE: 60 y.o. F with ESRD and cardiomyopathy referred for evaluation of chronic dyspnea  HPI:  This is a very difficult historian due to severe hearing deficit. She is unable to tell me who referred her to me. Hospital records indicate ESRD and cardiomyopathy. She was hospitalized 03/13-03/16/17 for acute hypoxic respiratory failure which was felt to be due to pulmonary edema. She also had a (recurrent) L pleural effusion which was not intervened upon. She carries a diagnosis but has never smoked. It is not clear that she has ever had PFTs done. There is a note from [facility] pulmonary medicine in [place] but does not shed much light on this. She reports chronic severe DOE. She denies CP, fever, purulent sputum, hemoptysis and calf tenderness. She has chronic LE edema. Her dyspnea improves after dialysis and was particularly bad on the day of this evaluation which was 3 days since her prior dialysis.     Past Medical History  Diagnosis Date  . ESRD on hemodialysis ([place])   . DM2 (diabetes mellitus, type 2) ([place])   . Hepatitis C   . Migraine with aura   . HOH (hard of hearing)   . Mitral regurgitation     a. calcified mitral annulus, mod MR by echo in 06/2014  . Cognitive impairment   . CAD (coronary artery disease)   . HTN (hypertension)   . Chronic systolic CHF (congestive heart failure) (H[facility])     a. EF 45% by echo in 06/2014  . Paroxysmal SVT (supraventricular tachycardia) ([place])     a. history of this, with last known occurrence in 06/2014  . COPD (chronic obstructive pulmonary disease) ([place]) 07/14/2014  . GERD (gastroesophageal reflux disease) 07/14/2014    Past Surgical History  Procedure Laterality Date  . Dialysis fistula creation    . Tubal ligation    . Colonoscopy with propofol Left 07/09/2014    Procedure: COLONOSCOPY WITH PROPOFOL;   Surgeon: Hulen Luster, MD;  Location: [facility] ENDOSCOPY;  Service: Endoscopy;  Laterality: Left;  . Colonoscopy N/A 07/15/2014    Procedure: COLONOSCOPY;  Surgeon: Manya Silvas, MD;  Location: [facility] [place] ENDOSCOPY;  Service: Endoscopy;  Laterality: N/A;    MEDICATIONS: I have reviewed all medications and confirmed regimen as documented  Social History   Social History  . Marital Status: Single    Spouse Name: N/A  . Number of Children: 2  . Years of Education: N/A   Occupational History  . retired [employer]  . Smoking status: Never Smoker   . Smokeless tobacco: Never Used  . Alcohol Use: No  . Drug Use: No  . Sexual Activity: Not on file   Other Topics Concern  . Not on file   Social History Narrative   Lives alone    Family History  Problem Relation Age of Onset  . Heart disease Mother   . Hypertension Mother     ROS: No fever, myalgias/arthralgias, unexplained weight loss or weight gain No new focal weakness or sensory deficits No otalgia, hearing loss, visual changes, nasal and sinus symptoms, mouth and throat problems No neck pain or adenopathy No abdominal pain, N/V/D, diarrhea, change in bowel pattern No dysuria, change in urinary pattern No calf tenderness   Filed Vitals:   04/15/15 1521  BP: 132/86  Pulse: 84  Height: 5\' 2"  (1.575 m)  Weight: 82.827 kg (182 lb 9.6 oz)  SpO2: 90%   2 lpm [place]  EXAM:  Gen: Chronically ill appearing, No overt respiratory distress HEENT: NCAT, sclera white, nares and nasal mucosa normal, oropharynx normal Neck: Supple without LAN, thyromegaly, + JVD Lungs: breath sounds diminished, dull to percussion in L base, bibasilar crackles, no wheezes Cardiovascular: Normal rate, reg rhythm, no murmurs noted, + S3 Abdomen: Soft, nontender, normal BS Ext: without clubbing, cyanosis. 2+ symmetric edema Neuro: Very HOH, otherwise CNs intact, motor and sensory intact, DTRs symmetric Skin:  Limited exam, no lesions noted  DATA:   BMP Latest Ref Rng 04/18/2015 04/04/2015 04/03/2015  Glucose 65 - 99 mg/dL 24(LL) 95 69  BUN 6 - 20 mg/dL 58(H) 37(H) 31(H)  Creatinine 0.44 - 1.00 mg/dL 7.27(H) 6.06(H) 4.77(H)  Sodium 135 - 145 mmol/L 138 136 136  Potassium 3.5 - 5.1 mmol/L 5.9(H) 4.7 4.3  Chloride 101 - 111 mmol/L 97(L) 96(L) 97(L)  CO2 22 - 32 mmol/L 16(L) 27 29  Calcium 8.9 - 10.3 mg/dL 9.7 8.0(L) 8.1(L)    CBC Latest Ref Rng 04/18/2015 04/02/2015 04/01/2015  WBC 3.6 - 11.0 K/uL 8.0 6.2 6.0  Hemoglobin 12.0 - 16.0 g/dL 8.5(L) 8.3(L) 9.3(L)  Hematocrit 35.0 - 47.0 % 26.0(L) 25.6(L) 28.8(L)  Platelets 150 - 440 K/uL 327 300 352   CT chest 09/02/14: Lingular consolidation, small L effusion  CXR (04/04/15): Cardiomyopathy, edema pattern, L effusion    TTE (04/02/15): LVEF 35-40%, moderate MR  IMPRESSION:   ESRD Dilated cardiomyopathy with mitral regurgitation Chronic hypoxic respiratory failure Pulmonary edema pattern on CXR Chronic L pleural effusion - no prior thoracentesis Disabling dyspnea  DISCUSSION: The dyspnea is likely fully attributable to pulmonary edema due to cardiomyopathy, ESRD, volume overload. IT is a little unusual to have an isolated L (as opposed to R) pleural effusion in this setting but the effusion is likely contributing minimally to the overall symptoms. I doubt that she has COPD but she does use bronchodilators and, to the extent that I can discern, she obtains some benefit from them symptomatically  PLAN:  1) I have discussed with Dr Holley Raring and we discussed re-assessing her dry weight and trying to "dry her out" more aggressively. He notes that she has a tendency to not show up for dialysis which of course doesn't help.  2) Suggested that she stop Spiriva as I doubt it is helping at all 3) She will follow up with me in 6-8 weeks with repeat CXR. If she is doing better, we can consider stopping Advair  Wilhelmina Mcardle, MD [facility],  Critical Care Medicine

## 2015-04-18 NOTE — ED Notes (Signed)
Called pharmacy to request  Calcium gluconate and dextrose solution. Was informed pharmacy would mix and send up

## 2015-04-18 NOTE — ED Provider Notes (Signed)
[facility] Emergency Department Provider Note  ____________________________________________    I have reviewed the triage vital signs and the nursing notes.   HISTORY  Chief Complaint Hematemesis    HPI Kelsey Peterson is a 60 y.o. female who presents with complains of nausea and vomiting. She reports her vomitus appears to resemble coffee grounds. She has been vomiting all day and missed dialysis. She denies abdominal pain. EMS reports that her glucose level was 11 and they gave glucagon IM. In the emergency department her glucose is 12. Patient does complain of mild shortness of breath. She denies chest pain. She is not on blood thinners     Past Medical History  Diagnosis Date  . ESRD on hemodialysis ([place])   . DM2 (diabetes mellitus, type 2) ([place])   . Hepatitis C   . Migraine with aura   . HOH (hard of hearing)   . Mitral regurgitation     a. calcified mitral annulus, mod MR by echo in 06/2014  . Cognitive impairment   . CAD (coronary artery disease)   . HTN (hypertension)   . Chronic systolic CHF (congestive heart failure) ([facility])     a. EF 45% by echo in 06/2014  . Paroxysmal SVT (supraventricular tachycardia) ([facility])     a. history of this, with last known occurrence in 06/2014    Patient Active Problem List   Diagnosis Date Noted  . Acute respiratory failure with hypoxia ([place]) 04/02/2015  . Pleural effusion, left 04/02/2015  . Extreme hearing loss 03/26/2015  . Acute on chronic systolic heart failure ([place]) 02/28/2015  . Cholelithiasis 12/01/2014  . Hyperkalemia 09/01/2014  . Fluid overload 09/01/2014  . GERD (gastroesophageal reflux disease) 07/14/2014  . COPD (chronic obstructive pulmonary disease) ([place]) 07/14/2014  . HTN (hypertension) 07/14/2014  . CAD (coronary artery disease) 07/14/2014  . End stage renal disease on dialysis ([place])   . Supraventricular tachycardia ([place])   . NSVT (nonsustained ventricular tachycardia) ([place])   .  Anemia 07/03/2014  . Diarrhea     Past Surgical History  Procedure Laterality Date  . Dialysis fistula creation    . Tubal ligation    . Colonoscopy with propofol Left 07/09/2014    Procedure: COLONOSCOPY WITH PROPOFOL;  Surgeon: Hulen Luster, MD;  Location: [facility] ENDOSCOPY;  Service: Endoscopy;  Laterality: Left;  . Colonoscopy N/A 07/15/2014    Procedure: COLONOSCOPY;  Surgeon: Manya Silvas, MD;  Location: [facility] ENDOSCOPY;  Service: Endoscopy;  Laterality: N/A;    Current Outpatient Rx  Name  Route  Sig  Dispense  Refill  . albuterol (PROVENTIL HFA;VENTOLIN HFA) 108 (90 Base) MCG/ACT inhaler   Inhalation   Inhale 2 puffs into the lungs every 6 (six) hours as needed for wheezing or shortness of breath.   1 Inhaler   2   . amiodarone (PACERONE) 200 MG tablet   Oral   Take 1 tablet (200 mg total) by mouth daily.         Marland Kitchen amLODipine (NORVASC) 5 MG tablet   Oral   Take 5 mg by mouth daily.         . calcium acetate (PHOSLO) 667 MG capsule   Oral   Take 2 capsules by mouth 3 (three) times daily with meals. And one capsule with snakes         . cinacalcet (SENSIPAR) 60 MG tablet   Oral   Take 60 mg by mouth daily.         Marland Kitchen  clopidogrel (PLAVIX) 75 MG tablet   Oral   Take 75 mg by mouth daily.         Marland Kitchen esomeprazole (NEXIUM) 40 MG capsule   Oral   Take 40 mg by mouth daily.          . Fluticasone-Salmeterol (ADVAIR DISKUS) 250-50 MCG/DOSE AEPB   Inhalation   Inhale 1 puff into the lungs 2 (two) times daily.   60 each   0   . folic acid-vitamin b complex-vitamin c-selenium-zinc (DIALYVITE) 3 MG TABS tablet   Oral   Take 1 tablet by mouth daily.         . furosemide (LASIX) 80 MG tablet   Oral   Take 80 mg by mouth daily. Patient takes on non dialysis days.         Marland Kitchen ipratropium-albuterol (DUONEB) 0.5-2.5 (3) MG/3ML SOLN   Nebulization   Take 3 mLs by nebulization 4 (four) times daily as needed. DX: COPD DX Code: J44.9   360 mL   11   .  isosorbide mononitrate (IMDUR) 30 MG 24 hr tablet   Oral   Take 30 mg by mouth daily.         . metoprolol tartrate (LOPRESSOR) 25 MG tablet   Oral   Take 12.5 mg by mouth 2 (two) times daily.         . ondansetron (ZOFRAN) 4 MG tablet   Oral   Take 4 mg by mouth every 8 (eight) hours as needed for nausea or vomiting. Reported on 02/05/2015           Allergies Contrast media; Morphine and related; and Other  Family History  Problem Relation Age of Onset  . Heart disease Mother   . Hypertension Mother     Social History Social History  Substance Use Topics  . Smoking status: Never Smoker   . Smokeless tobacco: Never Used  . Alcohol Use: No    Review of Systems  Constitutional: Negative for fever. Eyes: Negative for redness ENT: Negative for sore throat Cardiovascular: Negative for chest pain Respiratory:Mild shortness of breath Gastrointestinal: Negative for abdominal pain. Nausea and vomiting  Musculoskeletal: Negative for back pain. Skin: Negative for rash. Neurological: Negative for focal weakness Psychiatric: no anxiety    ____________________________________________   PHYSICAL EXAM:  VITAL SIGNS: ED Triage Vitals  Enc Vitals Group     BP 04/18/15 1852 170/118 mmHg     Pulse Rate 04/18/15 1852 108     Resp 04/18/15 1852 29     Temp 04/18/15 1852 97.5 F (36.4 C)     Temp Source 04/18/15 1852 Oral     SpO2 04/18/15 1852 96 %     Weight 04/18/15 1852 197 lb (89.359 kg)     Height --      Head Cir --      [place] --      Pain Score --      Pain Loc --      Pain Edu? --      Excl. in [place]? --      Constitutional: Alert, No acute distress Eyes: Conjunctivae are normal. No erythema or injection ENT   Head: Normocephalic and atraumatic.   Mouth/Throat: Mucous membranes are moist. Cardiovascular: Tachycardia, regular rhythm. Normal and symmetric distal pulses are present in the upper extremities. No murmurs or rubs  Respiratory:  Normal respiratory effort without tachypnea nor retractions. Breath sounds are clear and equal bilaterally.  Gastrointestinal: Soft and non-tender  in all quadrants. No distention. There is no CVA tenderness. Genitourinary: deferred Musculoskeletal: Nontender with normal range of motion in all extremities. No lower extremity tenderness nor edema. Neurologic:  Normal speech and language. No gross focal neurologic deficits are appreciated. Skin:  Skin is warm, dry and intact. No rash noted. Psychiatric: Mood and affect are normal. Patient exhibits appropriate insight and judgment.  ____________________________________________    LABS (pertinent positives/negatives)  Labs Reviewed  CBC - Abnormal; Notable for the following:    RBC 3.32 (*)    Hemoglobin 8.5 (*)    HCT 26.0 (*)    MCV 78.4 (*)    MCH 25.6 (*)    RDW 21.3 (*)    All other components within normal limits  PROTIME-INR - Abnormal; Notable for the following:    Prothrombin Time 20.2 (*)    All other components within normal limits  GLUCOSE, CAPILLARY - Abnormal; Notable for the following:    Glucose-Capillary 13 (*)    All other components within normal limits  GLUCOSE, CAPILLARY - Abnormal; Notable for the following:    Glucose-Capillary 12 (*)    All other components within normal limits  GLUCOSE, CAPILLARY - Abnormal; Notable for the following:    Glucose-Capillary 118 (*)    All other components within normal limits  APTT  COMPREHENSIVE METABOLIC PANEL  TROPONIN I  BRAIN NATRIURETIC PEPTIDE  TYPE AND SCREEN    ____________________________________________   EKG  ED ECG REPORT I, Lavonia Drafts, the attending physician, personally viewed and interpreted this ECG.   Date: 04/18/2015  EKG Time: 6:58 PM  Rate: 88  Rhythm: normal sinus rhythm  Axis: Normal  Intervals:Prolonged PR  ST&T Change: Nonspecific   ____________________________________________    RADIOLOGY  Vascular congestion, cardiomegaly,  left effusion  ____________________________________________   PROCEDURES  Procedure(s) performed: none  Critical Care performed:yes  CRITICAL CARE Performed by: Lavonia Drafts   Total critical care time: 30 minutes  Critical care time was exclusive of separately billable procedures and treating other patients.  Critical care was necessary to treat or prevent imminent or life-threatening deterioration.  Critical care was time spent personally by me on the following activities: development of treatment plan with patient and/or surrogate as well as nursing, discussions with consultants, evaluation of patient's response to treatment, examination of patient, obtaining history from patient or surrogate, ordering and performing treatments and interventions, ordering and review of laboratory studies, ordering and review of radiographic studies, pulse oximetry and re-evaluation of patient's condition.  ____________________________________________   INITIAL IMPRESSION / ASSESSMENT AND PLAN / ED COURSE  Pertinent labs & imaging results that were available during my care of the patient were reviewed by me and considered in my medical decision making (see chart for details).  Patient presents with severe hypoglycemia. One ampule of D50 given immediately upon arrival. Glucose increased from 12-118. Unclear cause of hyperglycemia. Patient reportedly had hemoglobin A1c checked in hospital which was normal, she does not take any medications for diabetes.  She does appear to have coffee-ground emesis. We will start Protonix IV, send type and screen and admit to the hospital for further evaluation  Patient with elevated potassium, discussed with on-call nephrology who will arrange for dialysis, no EKG changes noted. Dr. Abigail Butts does not recommend calcium and Kayexalate at this time  ____________________________________________   FINAL CLINICAL IMPRESSION(S) / ED DIAGNOSES  Final diagnoses:   Upper GI bleed  Hypoglycemia  Acute hyperkalemia          Lavonia Drafts,  MD 04/18/15 2054

## 2015-04-18 NOTE — ED Notes (Signed)
Pt started with vomiting of coffee ground emesis this AM.  EMS saw about 500 ml loss before transport. Low blood sugar on dock (<10) but pt was still responsive. Denies diarrhea.

## 2015-04-18 NOTE — ED Notes (Addendum)
Report to [place] rn. Informed needs assessment.

## 2015-04-18 NOTE — H&P (Signed)
[facility] [place] NAME: Kelsey Peterson    MR#:  [id number]  DATE OF BIRTH:  12-19-1955  DATE OF ADMISSION:  04/18/2015  PRIMARY CARE PHYSICIAN: No primary care provider on file.   REQUESTING/REFERRING PHYSICIAN: Dr Corky Downs  CHIEF COMPLAINT:   Chief Complaint  Patient presents with  . Hematemesis    HISTORY OF PRESENT ILLNESS: Kelsey Peterson  is a 60 y.o. female with a known history of with a past surgical history significant for ESRD on hemodialysis, hepatitis C, cognitive impairment, hearing loss, coronary artery disease, hypertension, CHF, PSVT and COPD. She presents to the ED today due to complaining of significant nausea and vomiting. Patient states that her nausea started last night and she started vomiting early today. She is a poor historian due to her hearing loss and is unable to tell me a full history but she states that she has vomited many times today (she cannot put a number). Vomitus contained of recently ingested food and coffee ground emesis. She also states that she had some blood in her vomitus at a point in time. She has been unable to eat for the last 3 days but she denies any fever, abdominal pain, recent travel, consumption of unusual food or any changes in bowel habits. She also denies any changes in urine habits. She was unable to undergo her hemodialysis today due to her illness. While being chest noted to the ED, EMS noted that her glucose level was 11. She was given IM glucagon and on arrival in the ED, glucose was 12. Patient was then given D50. She denies any chest pain but has had some mild shortness of breath which is chronic. She is not on any blood thinners and denies use of over-the-counter medications. There has been no recent change in her medications. She states that she is not diabetic.  Of note, patient was admitted about 2 weeks ago to this facility and was managed for acute on chronic respiratory  failure with hypoxia as well as significant hypoglycemia and hyperkalemia. A1c was normal at that time and etiology of hypoglycemia was not found.  In the ED today, hemoglobin was 8.5 with a normal platelet counts and white count. CMP was sig for a calcium of 5.9, BUN of 58, creatinine of 7.27 and glucose of 24. Troponin was elevated at 0.13 and INR was 1.7. EKG showed first-degree AV block with no peaked T waves and chest x-ray was only significant for cardiomegaly and increased vascular congestion/CHF. Patient was given D50 and was type and screen in the ED. Obtaining history from patient, patient continued to vomit greenish/dark brown fluids which appears to be copious.  She is full code.  PAST MEDICAL HISTORY:   Past Medical History  Diagnosis Date  . ESRD on hemodialysis ([place])   . DM2 (diabetes mellitus, type 2) ([place])   . Hepatitis C   . Migraine with aura   . HOH (hard of hearing)   . Mitral regurgitation     a. calcified mitral annulus, mod MR by echo in 06/2014  . Cognitive impairment   . CAD (coronary artery disease)   . HTN (hypertension)   . Chronic systolic CHF (congestive heart failure) ([facility])     a. EF 45% by echo in 06/2014  . Paroxysmal SVT (supraventricular tachycardia) ([place])     a. history of this, with last known occurrence in 06/2014  . COPD (chronic obstructive pulmonary disease) ([place]) 07/14/2014  .  GERD (gastroesophageal reflux disease) 07/14/2014    PAST SURGICAL HISTORY: Past Surgical History  Procedure Laterality Date  . Dialysis fistula creation    . Tubal ligation    . Colonoscopy with propofol Left 07/09/2014    Procedure: COLONOSCOPY WITH PROPOFOL;  Surgeon: Hulen Luster, MD;  Location: [facility] ENDOSCOPY;  Service: Endoscopy;  Laterality: Left;  . Colonoscopy N/A 07/15/2014    Procedure: COLONOSCOPY;  Surgeon: Manya Silvas, MD;  Location: [facility] ENDOSCOPY;  Service: Endoscopy;  Laterality: N/A;    SOCIAL HISTORY:  Social History  Substance Use Topics  .  Smoking status: Never Smoker   . Smokeless tobacco: Never Used  . Alcohol Use: No    FAMILY HISTORY:  Family History  Problem Relation Age of Onset  . Heart disease Mother   . Hypertension Mother     DRUG ALLERGIES:  Allergies  Allergen Reactions  . Contrast Media [Iodinated Diagnostic Agents] Other (See Comments)    Unknown reaction  . Morphine And Related Hives  . Other Rash    Blood Pressure Pill (Pt doesn't remember name)     REVIEW OF SYSTEMS:   CONSTITUTIONAL: No fever EYES: No blurred or double vision.  EARS, NOSE, AND THROAT: No tinnitus or ear pain.  RESPIRATORY: As in history of present illness CARDIOVASCULAR: No chest pain, orthopnea GASTROINTESTINAL: As in history of present illness GENITOURINARY: No dysuria, hematuria.  ENDOCRINE: No polyuria, nocturia,  HEMATOLOGY: No anemia, easy bruising or bleeding SKIN: No rash or lesion. MUSCULOSKELETAL: No joint pain or arthritis.   NEUROLOGIC: No tingling, numbness, weakness.  PSYCHIATRY: No anxiety or depression.   MEDICATIONS AT HOME:  Prior to Admission medications   Medication Sig Start Date End Date Taking? Authorizing Provider  albuterol (PROVENTIL HFA;VENTOLIN HFA) 108 (90 Base) MCG/ACT inhaler Inhale 2 puffs into the lungs every 6 (six) hours as needed for wheezing or shortness of breath. 04/04/15   Hillary Bow, MD  amiodarone (PACERONE) 200 MG tablet Take 1 tablet (200 mg total) by mouth daily. 04/04/15   Srikar Sudini, MD  amLODipine (NORVASC) 5 MG tablet Take 5 mg by mouth daily.    Historical Provider, MD  calcium acetate (PHOSLO) 667 MG capsule Take 2 capsules by mouth 3 (three) times daily with meals. And one capsule with snakes 10/05/14   Historical Provider, MD  cinacalcet (SENSIPAR) 60 MG tablet Take 60 mg by mouth daily.    Historical Provider, MD  clopidogrel (PLAVIX) 75 MG tablet Take 75 mg by mouth daily. 08/31/14   Historical Provider, MD  esomeprazole (NEXIUM) 40 MG capsule Take 40 mg by mouth  daily.     Historical Provider, MD  Fluticasone-Salmeterol (ADVAIR DISKUS) 250-50 MCG/DOSE AEPB Inhale 1 puff into the lungs 2 (two) times daily. 04/04/15   Hillary Bow, MD  folic acid-vitamin b complex-vitamin c-selenium-zinc (DIALYVITE) 3 MG TABS tablet Take 1 tablet by mouth daily.    Historical Provider, MD  furosemide (LASIX) 80 MG tablet Take 80 mg by mouth daily. Patient takes on non dialysis days.    Historical Provider, MD  ipratropium-albuterol (DUONEB) 0.5-2.5 (3) MG/3ML SOLN Take 3 mLs by nebulization 4 (four) times daily as needed. DX: COPD DX Code: J44.9 04/16/15   Wilhelmina Mcardle, MD  isosorbide mononitrate (IMDUR) 30 MG 24 hr tablet Take 30 mg by mouth daily.    Historical Provider, MD  metoprolol tartrate (LOPRESSOR) 25 MG tablet Take 12.5 mg by mouth 2 (two) times daily. 10/21/14   Historical Provider, MD  ondansetron (ZOFRAN) 4 MG tablet Take 4 mg by mouth every 8 (eight) hours as needed for nausea or vomiting. Reported on 02/05/2015    Historical Provider, MD      PHYSICAL EXAMINATION:   VITAL SIGNS: Blood pressure 165/105, pulse 88, temperature 97.5 F (36.4 C), temperature source Oral, resp. rate 22, weight 89.359 kg (197 lb), SpO2 100 %.  GENERAL:  60 y.o.-year-old patient lying in the bed Looking acutely ill. Alert and oriented x 3. EYES: Pupils equal, round, reactive to light and accommodation. No scleral icterus. Extraocular muscles intact.  HEENT: Head atraumatic, normocephalic. Oropharynx and nasopharynx clear. Very dry Oral mucous membranes  NECK:  Supple, no jugular venous distention. No thyroid enlargement, no tenderness.  LUNGS: Normal breath sounds bilaterally, no wheezing, rales,rhonchi or crepitation. No use of accessory muscles of respiration.  CARDIOVASCULAR: S1, S2 normal. No murmurs, rubs, or gallops.  ABDOMEN: Soft, nontender, nondistended. Bowel sounds present. No organomegaly or mass.  EXTREMITIES:  1+ pitting pedal edema bilaterally up to knees,  cyanosis, or clubbing.  NEUROLOGIC: Cranial nerves II through XII are intact. Muscle strength 5/5 in all extremities. Sensation intact. Gait not checked.   SKIN: No obvious rash, lesion, or ulcer.   LABORATORY PANEL:   CBC  Recent Labs Lab 04/18/15 1856  WBC 8.0  HGB 8.5*  HCT 26.0*  PLT 327  MCV 78.4*  MCH 25.6*  MCHC 32.7  RDW 21.3*   ------------------------------------------------------------------------------------------------------------------  Chemistries   Recent Labs Lab 04/18/15 1856  NA 138  K 5.9*  CL 97*  CO2 16*  GLUCOSE 24*  BUN 58*  CREATININE 7.27*  CALCIUM 9.7  AST 66*  ALT 48  ALKPHOS 133*  BILITOT 3.1*   ------------------------------------------------------------------------------------------------------------------ estimated creatinine clearance is 8.5 mL/min (by C-G formula based on Cr of 7.27). ------------------------------------------------------------------------------------------------------------------ No results for input(s): TSH, T4TOTAL, T3FREE, THYROIDAB in the last 72 hours.  Invalid input(s): FREET3   Coagulation profile  Recent Labs Lab 04/18/15 1856  INR 1.73   ------------------------------------------------------------------------------------------------------------------- No results for input(s): DDIMER in the last 72 hours. -------------------------------------------------------------------------------------------------------------------  Cardiac Enzymes  Recent Labs Lab 04/18/15 1856  TROPONINI 0.13*   ------------------------------------------------------------------------------------------------------------------ Invalid input(s): POCBNP  ---------------------------------------------------------------------------------------------------------------  Urinalysis    Component Value Date/Time   COLORURINE YELLOW* 12/01/2014 1209   COLORURINE Yellow 08/23/2013 1535   APPEARANCEUR TURBID* 12/01/2014 1209    APPEARANCEUR Clear 08/23/2013 1535   LABSPEC 1.016 12/01/2014 1209   LABSPEC 1.010 08/23/2013 1535   PHURINE 7.0 12/01/2014 1209   PHURINE 8.0 08/23/2013 1535   GLUCOSEU NEGATIVE 12/01/2014 1209   GLUCOSEU Negative 08/23/2013 1535   HGBUR 2+* 12/01/2014 1209   HGBUR Negative 08/23/2013 1535   BILIRUBINUR NEGATIVE 12/01/2014 1209   BILIRUBINUR Negative 08/23/2013 [place] 12/01/2014 1209   KETONESUR Negative 08/23/2013 1535   PROTEINUR >500* 12/01/2014 1209   PROTEINUR 100 mg/dL 08/23/2013 1535   NITRITE NEGATIVE 12/01/2014 1209   NITRITE Negative 08/23/2013 1535   LEUKOCYTESUR 2+* 12/01/2014 1209   LEUKOCYTESUR Trace 08/23/2013 1535     RADIOLOGY: Dg Chest Portable 1 View  04/18/2015  CLINICAL DATA:  Coffee ground emesis EXAM: PORTABLE CHEST 1 VIEW COMPARISON:  04/04/2015 FINDINGS: Cardiac shadow remains enlarged. A moderate-sized left-sided pleural effusion is again identified and stable. Underlying infiltrate is likely present. Mild vascular congestion remains. No new focal infiltrate is seen. IMPRESSION: Cardiomegaly and vascular congestion consistent with CHF. Stable left basilar infiltrate and effusion. Electronically Signed   By: Inez Catalina M.D.   On:  04/18/2015 19:20    EKG: Orders placed or performed during the hospital encounter of 04/18/15  . ED EKG  . ED EKG    ASSESSMENT  Principal Problem:   Hypoglycemia Active Problems:   Hyperkalemia   Upper GI bleed   Elevated troponin   ESRD on hemodialysis (HCC)   Volume depletion, gastrointestinal loss   Uncontrolled hypertension  PLAN   1). Severe hypoglycemia - Patient presented with severe hypoglycemia with initial glucose of 11 and subsequent rechecks were 13 and 24. Glucose is currently 91 after D50 was given. - Patient previously presented to the ED about 2 weeks ago with severe hypoglycemia. Etiology will is unclear. Patient is a diabetic. - Patient has been started on D10 infusion and  we will check glucose every 3 hours. - Check cortisol levels and obtain A1c.  2). Hypokalemia - Patient's potassium is 5.9. EKG does not show any peaked T waves. - Patient given 15 g of Kayexalate. She'll be dialyzed tomorrow.  3). Possible upper GI bleed - Patient has been having coffee-ground emesis all day. I have not seen frank blood but patient states that she vomited blood at point. - Patient's hemoglobin is 8.5. She has been typed and screened and we will consult GI for possible endoscopy in the morning - Patient is currently nothing by mouth. - Continue pantoprazole and Zofran.  4). ESRD on hemodialysis - Patient is on hemodialysis on Tuesday Thursday Saturday Saturdays. She missed her dialysis today due to significant nausea and vomiting. - Nephrology has been consulted and will dialyze patient in the morning.  5). Volume depletion - Patient is significantly volume depleted clinically likely due to intractable vomiting.  - Gentle IV fluid bolus given due to patient's CHF and ESRD. - Monitor fluid status closely - Hold Lasix  6). Elevated troponin - Troponin is elevated at 0.13. This is likely due to end-stage renal disease. We will however check troponin one more time to rule out ACS. Patient doesn't have chest pain. - Continue metoprolol  7).  Uncontrolled hypertension - Continue home blood pressure medications and add IV hydralazine when necessary for elevated blood pressure.  All the records are reviewed and case discussed with ED provider. Management plans discussed with the patient, family and they are in agreement.  CODE STATUS: Code Status History    Date Active Date Inactive Code Status Order ID Comments User Context   04/02/2015  3:21 AM 04/04/2015  8:25 PM Full Code [id number]  Lance Coon, MD Inpatient   02/05/2015  9:17 AM 02/08/2015  8:00 PM Full Code [id number]  Fritzi Mandes, MD Inpatient   12/01/2014  3:08 PM 12/07/2014  7:15 PM Full Code [id number]  Demetrios Loll,  MD Inpatient   09/01/2014  5:57 PM 09/06/2014  2:09 PM Full Code [id number]  Demetrios Loll, MD ED   07/14/2014  5:17 AM 07/16/2014  6:40 PM Full Code [id number]  Lance Coon, MD Inpatient   07/03/2014  2:06 PM 07/10/2014  7:11 PM Full Code [id number]  Demetrios Loll, MD Inpatient   06/21/2014  1:35 PM 06/22/2014  4:33 PM Full Code [id number]  Max Sane, MD Inpatient      I have independently reviewed all EKG and chest x-ray data  VTE prophylaxis: SCDs Due to possible GI bleed  Vaccinations: Pneumonia & flu vaccine per hospital protocol Prevention: Will proceed with conservative measures for the prevention of delirium in patients older than 65. Fall precautions and 1:1 sitter as needed per hospital protocol.  TOTAL  TIME TAKING CARE OF THIS PATIENT: 50 minutes.    Sylvan Cheese M.D on 04/18/2015 at 9:46 PM  Between 7am to 6pm - Pager - [phone]  After 6pm go to [email or web address] - password [email or web address] [facility]  [facility]  Office  [phone]  CC: Primary care physician; No primary care provider on file.

## 2015-04-19 LAB — GLUCOSE, CAPILLARY
GLUCOSE-CAPILLARY: 102 mg/dL — AB (ref 65–99)
GLUCOSE-CAPILLARY: 113 mg/dL — AB (ref 65–99)
Glucose-Capillary: 100 mg/dL — ABNORMAL HIGH (ref 65–99)
Glucose-Capillary: 131 mg/dL — ABNORMAL HIGH (ref 65–99)

## 2015-04-19 LAB — HEMOGLOBIN AND HEMATOCRIT, BLOOD
HCT: 21 % — ABNORMAL LOW (ref 35.0–47.0)
HEMOGLOBIN: 7 g/dL — AB (ref 12.0–16.0)

## 2015-04-19 LAB — PREPARE RBC (CROSSMATCH)

## 2015-04-19 LAB — CBC
HCT: 21.9 % — ABNORMAL LOW (ref 35.0–47.0)
Hemoglobin: 7.2 g/dL — ABNORMAL LOW (ref 12.0–16.0)
MCH: 25.5 pg — ABNORMAL LOW (ref 26.0–34.0)
MCHC: 33 g/dL (ref 32.0–36.0)
MCV: 77.3 fL — ABNORMAL LOW (ref 80.0–100.0)
Platelets: 277 10*3/uL (ref 150–440)
RBC: 2.83 MIL/uL — ABNORMAL LOW (ref 3.80–5.20)
RDW: 21.4 % — ABNORMAL HIGH (ref 11.5–14.5)
WBC: 7.7 10*3/uL (ref 3.6–11.0)

## 2015-04-19 LAB — BASIC METABOLIC PANEL
Anion gap: 16 — ABNORMAL HIGH (ref 5–15)
BUN: 65 mg/dL — ABNORMAL HIGH (ref 6–20)
CO2: 22 mmol/L (ref 22–32)
Calcium: 8.8 mg/dL — ABNORMAL LOW (ref 8.9–10.3)
Chloride: 99 mmol/L — ABNORMAL LOW (ref 101–111)
Creatinine, Ser: 7.91 mg/dL — ABNORMAL HIGH (ref 0.44–1.00)
GFR calc Af Amer: 6 mL/min — ABNORMAL LOW (ref >60)
GFR calc non Af Amer: 5 mL/min — ABNORMAL LOW (ref >60)
Glucose, Bld: 104 mg/dL — ABNORMAL HIGH (ref 65–99)
Potassium: 5.2 mmol/L — ABNORMAL HIGH (ref 3.5–5.1)
Sodium: 137 mmol/L (ref 135–145)

## 2015-04-19 LAB — HEMOGLOBIN A1C

## 2015-04-19 LAB — MAGNESIUM: Magnesium: 2.1 mg/dL (ref 1.7–2.4)

## 2015-04-19 LAB — OCCULT BLOOD X 1 CARD TO LAB, STOOL: FECAL OCCULT BLD: POSITIVE — AB

## 2015-04-19 LAB — CORTISOL: Cortisol, Plasma: 12.8 ug/dL

## 2015-04-19 MED ORDER — EPOETIN ALFA 10000 UNIT/ML IJ SOLN
10000.0000 [IU] | Freq: Once | INTRAMUSCULAR | Status: AC
Start: 2015-04-19 — End: 2015-04-19
  Administered 2015-04-19: 10000 [IU] via INTRAVENOUS

## 2015-04-19 MED ORDER — DEXTROSE 5 % IV SOLN
INTRAVENOUS | Status: AC
  Administered 2015-04-19 – 2015-04-20 (×2): via INTRAVENOUS

## 2015-04-19 MED ORDER — SODIUM CHLORIDE 0.9 % IV SOLN
Freq: Once | INTRAVENOUS | Status: AC
  Administered 2015-04-19: 15:00:00 via INTRAVENOUS

## 2015-04-19 NOTE — Progress Notes (Signed)
Post HD Tx Assessment  

## 2015-04-19 NOTE — Consult Note (Signed)
GI Inpatient Consult Note  Reason for Consult: Coffee ground emesis, hem positive stool   Attending Requesting Consult: Dr. Melynda Ripple  History of Present Illness: Kelsey Peterson is a 60 y.o. female  with a significant past medical history of end-stage renal on hemodialysis, Tuesday, Thursday, Saturday, hearing impairment, history of GI bleed, on Plavix and 2 L of O2 24/7, presented to the emergency department after having multiple episodes of vomiting.  She reports this started on Tuesday, was not able to keep eating food or beverage down.  She was found to be hypoglycemic, hyperkalemic, heme positive stool, coffee-ground emesis, hemoglobin of 7.2.  She reports her last dose of Plavix was on Monday, last time she had anything to eat or drink was Monday.    Reports she is very tired and weak.  She denies any abdominal pain. She denies NSAID use.  She reports she had a bowel movement last night, was black.  She had a colonoscopy July 09, 2014 with Dr. Candace Cruise for the indication of generalized abdominal pain, heme positive stool, iron deficiency anemia secondary to chronic blood loss.  Impression diverticulosis in the sigmoid and descending colon.  2 small polyps in the cecum.  3 small polyps in the ascending colon.  She had an upper endoscopy completed at the same time for the same indication.  Impression normal esophagus, normal stomach, normal examined duodenum, no specimens collected.  She had a second colonoscopy completed on July 15, 2014 due to bleeding from the polypectomy site.  This was completed by Dr. Vira Agar.  Findings: 4 sites were seen to have fresh clot adherent to ulcerated post polypectomy sites.  None were actively leading but due to the need for dialysis and low-dose heparin 3 clips were placed on each of the 3 sites in the ascending colon and 4 clips on the cecal site with good results and no active bleeding anywhere.  2 minuscule polyps are seen but not removed, probably 2 mm  each.  Multiple small mouth diverticula in the sigmoid colon, internal hemorrhoids, small, grade 1.     Past Medical History:  Past Medical History  Diagnosis Date  . ESRD on hemodialysis ([place])   . DM2 (diabetes mellitus, type 2) ([place])   . Hepatitis C   . Migraine with aura   . HOH (hard of hearing)   . Mitral regurgitation     a. calcified mitral annulus, mod MR by echo in 06/2014  . Cognitive impairment   . CAD (coronary artery disease)   . HTN (hypertension)   . Chronic systolic CHF (congestive heart failure) (H[facility])     a. EF 45% by echo in 06/2014  . Paroxysmal SVT (supraventricular tachycardia) ([place])     a. history of this, with last known occurrence in 06/2014  . COPD (chronic obstructive pulmonary disease) ([place]) 07/14/2014  . GERD (gastroesophageal reflux disease) 07/14/2014    Problem List: Patient Active Problem List   Diagnosis Date Noted  . Hypoglycemia 04/18/2015  . Upper GI bleed 04/18/2015  . Elevated troponin 04/18/2015  . ESRD on hemodialysis ([place]) 04/18/2015  . Volume depletion, gastrointestinal loss 04/18/2015  . Uncontrolled hypertension 04/18/2015  . Acute respiratory failure with hypoxia ([place]) 04/02/2015  . Pleural effusion, left 04/02/2015  . Extreme hearing loss 03/26/2015  . Acute on chronic systolic heart failure ([place]) 02/28/2015  . Cholelithiasis 12/01/2014  . Hyperkalemia 09/01/2014  . Fluid overload 09/01/2014  . GERD (gastroesophageal reflux disease) 07/14/2014  . COPD (chronic obstructive pulmonary disease) (  [place]) 07/14/2014  . HTN (hypertension) 07/14/2014  . CAD (coronary artery disease) 07/14/2014  . End stage renal disease on dialysis ([place])   . Supraventricular tachycardia ([place])   . NSVT (nonsustained ventricular tachycardia) ([place])   . Anemia 07/03/2014  . Diarrhea     Past Surgical History: Past Surgical History  Procedure Laterality Date  . Dialysis fistula creation    . Tubal ligation    . Colonoscopy with propofol Left 07/09/2014     Procedure: COLONOSCOPY WITH PROPOFOL;  Surgeon: Hulen Luster, MD;  Location[facility] ENDOSCOPY;  Service: Endoscopy;  Laterality: Left;  . Colonoscopy N/A 07/15/2014    Procedure: COLONOSCOPY;  Surgeon: Manya Silvas, MD;  Location: [facility] ENDOSCOPY;  Service: Endoscopy;  Laterality: N/A;    Allergies: Allergies  Allergen Reactions  . Contrast Media [Iodinated Diagnostic Agents] Other (See Comments)    Unknown reaction  . Morphine And Related Hives  . Other Rash    Blood Pressure Pill (Pt doesn't remember name)     Home Medications: Prescriptions prior to admission  Medication Sig Dispense Refill Last Dose  . albuterol (PROVENTIL HFA;VENTOLIN HFA) 108 (90 Base) MCG/ACT inhaler Inhale 2 puffs into the lungs every 6 (six) hours as needed for wheezing or shortness of breath. 1 Inhaler 2 unknown at unknown  . amiodarone (PACERONE) 200 MG tablet Take 1 tablet (200 mg total) by mouth daily.   unknown at unknown  . amLODipine (NORVASC) 5 MG tablet Take 5 mg by mouth daily.   unknown at unknown  . calcium acetate (PHOSLO) 667 MG capsule Take 2 capsules by mouth 3 (three) times daily with meals. And one capsule with snakes   unknown at unknown  . cinacalcet (SENSIPAR) 60 MG tablet Take 60 mg by mouth daily.   unknown at unknown  . clopidogrel (PLAVIX) 75 MG tablet Take 75 mg by mouth daily.   unknown at unknown  . esomeprazole (NEXIUM) 40 MG capsule Take 40 mg by mouth daily.    unknown at unknown  . Fluticasone-Salmeterol (ADVAIR DISKUS) 250-50 MCG/DOSE AEPB Inhale 1 puff into the lungs 2 (two) times daily. 60 each 0 unknown at unknown  . folic acid-vitamin b complex-vitamin c-selenium-zinc (DIALYVITE) 3 MG TABS tablet Take 1 tablet by mouth daily.   unknown at unknown  . furosemide (LASIX) 80 MG tablet Take 80 mg by mouth daily. Patient takes on non dialysis days.   unknown at unknown  . ipratropium-albuterol (DUONEB) 0.5-2.5 (3) MG/3ML SOLN Take 3 mLs by nebulization 4 (four) times daily as needed.  DX: COPD DX Code: J44.9 360 mL 11 unknown at unknown  . isosorbide mononitrate (IMDUR) 30 MG 24 hr tablet Take 30 mg by mouth daily.   unknown at unknown  . metoprolol tartrate (LOPRESSOR) 25 MG tablet Take 12.5 mg by mouth 2 (two) times daily.   unknown at unknown  . ondansetron (ZOFRAN) 4 MG tablet Take 4 mg by mouth every 8 (eight) hours as needed for nausea or vomiting. Reported on 02/05/2015   unknown at unknown   Home medication reconciliation was completed with the patient.   Scheduled Inpatient Medications:   . sodium chloride   Intravenous Once  . amiodarone  200 mg Oral Daily  . amLODipine  5 mg Oral Daily  . calcium acetate  1,334 mg Oral TID WC  . cinacalcet  60 mg Oral Q breakfast  . fluticasone furoate-vilanterol  1 puff Inhalation Daily  . isosorbide mononitrate  30 mg Oral Daily  .  metoprolol tartrate  12.5 mg Oral BID  . pantoprazole (PROTONIX) IV  40 mg Intravenous Q12H    Continuous Inpatient Infusions:   . dextrose 50 mL/hr at 04/18/15 2349    PRN Inpatient Medications:  albuterol, hydrALAZINE, ipratropium-albuterol, ondansetron  Family History: family history includes Heart disease in her mother; Hypertension in her mother.    Social History:   reports that she has never smoked. She has never used smokeless tobacco. She reports that she does not drink alcohol or use illicit drugs.   Review of Systems: Constitutional: Weight is stable.  Eyes: No changes in vision. ENT: No oral lesions, sore throat.  GI: see HPI.  Heme/Lymph: No easy bruising.  CV: No chest pain.  GU: No hematuria.  Integumentary: No rashes.  Neuro: No headaches.  Psych: No depression/anxiety.  Endocrine: No heat/cold intolerance.  Allergic/Immunologic: No urticaria.  Resp: No cough  Musculoskeletal: No joint swelling.    Physical Examination: BP 152/83 mmHg  Pulse 96  Temp(Src) 97.7 F (36.5 C) (Oral)  Resp 17  Ht 5\' 2"  (1.575 m)  Wt 84.5 kg (186 lb 4.6 oz)  BMI 34.06 kg/m2   SpO2 100% Gen: NAD, alert and oriented x 4, patient examined in dialysis HEENT: PEERLA, EOMI, Neck: supple, no JVD or thyromegaly Chest: CTA bilaterally, no wheezes, crackles, or other adventitious sounds CV: RRR, no m/g/c/r Abd: soft, NT, ND, +BS in all four quadrants; no HSM, guarding, ridigity, or rebound tenderness Ext: no edema, well perfused with 2+ pulses, Skin: no rash or lesions noted Lymph: no LAD  Data: Lab Results  Component Value Date   WBC 7.7 04/19/2015   HGB 7.0* 04/19/2015   HCT 21.0* 04/19/2015   MCV 77.3* 04/19/2015   PLT 277 04/19/2015    Recent Labs Lab 04/18/15 1856 04/19/15 0401 04/19/15 0918  HGB 8.5* 7.2* 7.0*   Lab Results  Component Value Date   NA 137 04/19/2015   K 5.2* 04/19/2015   CL 99* 04/19/2015   CO2 22 04/19/2015   BUN 65* 04/19/2015   CREATININE 7.91* 04/19/2015   Lab Results  Component Value Date   ALT 48 04/18/2015   AST 66* 04/18/2015   ALKPHOS 133* 04/18/2015   BILITOT 3.1* 04/18/2015    Recent Labs Lab 04/18/15 1856  APTT 33  INR 1.73     Assessment/Plan: Ms. Figiel is a 60 y.o. female With coffee ground emesis, heme positive stool, found to have a hemoglobin of 7.2, currently 7.0, appears her baseline is between 8 and 10.  History of post polypectomy bleeding in June 2016.  End-stage renal dialysis, Tuesday, Thursday, Saturday.  Metabolic imbalances have been addressed. Patient was communicated with through written questions. Protonix 40 mg IV BID.   Recommendations: We recommend continuing to follow CBC, with a CBC in the morning, PT/INR, and continue to hole Plavix. Dr. Vira Agar will evaluate further with an upper endoscopy completed tomorrow morning before the patient has dialysis.  Should remain n.p.o. after midnight, okay for full liquids until then.  Thank you for the consult. Please call with questions or concerns.  Salvadore Farber, PA-C  I personally performed these services.

## 2015-04-19 NOTE — Progress Notes (Signed)
HD Tx Pre Assessment 

## 2015-04-19 NOTE — Progress Notes (Signed)
Dr Molli Hazard made aware of HBG 7.0.  Pt in dialysis at the present

## 2015-04-19 NOTE — Progress Notes (Signed)
HD TX Initiation 

## 2015-04-19 NOTE — Care Management Important Message (Signed)
Important Message  Patient Details  Name: RAMSAY KOLLMANN MRN: [id number] Date of Birth: 07/17/1955   Medicare Important Message Given:  Yes    Katrina Stack, RN 04/19/2015, 4:58 PM

## 2015-04-19 NOTE — Progress Notes (Signed)
Pt admitted to room 260. A&Ox4, VSS, no acute distress. Pt oriented to room and unit, educated on telephone and call bell. Pt refused bed alarm, educated on importance of calling nursing before getting out of bed, verbalized agreement. Skin assessed with [place], RN, telemetry verified with Janett Billow, NT. RN will continue to monitor and treat per MD orders. Rachael Fee, RN

## 2015-04-19 NOTE — Progress Notes (Signed)
Pt's stool resulted positive for occult blood and hgb dropped to 7.1. MD Dr. Estanislado Pandy notified. Orders given for a hgb and hct recheck at 1000 today. RN will continue to monitor. Rachael Fee, RN

## 2015-04-19 NOTE — Progress Notes (Signed)
HD Tx Termination 

## 2015-04-19 NOTE — Progress Notes (Signed)
[place] [facility] [place] NAME: Kelsey Peterson    MR#:  [id number]  DATE OF BIRTH:  04/08/1955  SUBJECTIVE:  CHIEF COMPLAINT:   Chief Complaint  Patient presents with  . Hematemesis   Last vomit was 11 pm last night, received HD today, Hb dropped to 7, she is OK to get Blood transfusion. REVIEW OF SYSTEMS:  CONSTITUTIONAL: No fever, fatigue or weakness.  EYES: No blurred or double vision.  EARS, NOSE, AND THROAT: No tinnitus or ear pain.  RESPIRATORY: No cough, shortness of breath, wheezing or hemoptysis.  CARDIOVASCULAR: No chest pain, orthopnea, edema.  GASTROINTESTINAL: positive nausea, vomiting,no diarrhea or abdominal pain. Dark vomit. GENITOURINARY: No dysuria, hematuria.  ENDOCRINE: No polyuria, nocturia,  HEMATOLOGY: No anemia, easy bruising or bleeding SKIN: No rash or lesion. MUSCULOSKELETAL: No joint pain or arthritis.   NEUROLOGIC: No tingling, numbness, weakness.  PSYCHIATRY: No anxiety or depression.   ROS  DRUG ALLERGIES:   Allergies  Allergen Reactions  . Contrast Media [Iodinated Diagnostic Agents] Other (See Comments)    Unknown reaction  . Morphine And Related Hives  . Other Rash    Blood Pressure Pill (Pt doesn't remember name)     VITALS:  Blood pressure 155/96, pulse 95, temperature 97.5 F (36.4 C), temperature source Oral, resp. rate 16, height 5\' 2"  (1.575 m), weight 83.3 kg (183 lb 10.3 oz), SpO2 96 %.  PHYSICAL EXAMINATION:  GENERAL:  60 y.o.-year-old patient lying in the bed with no acute distress.  EYES: Pupils equal, round, reactive to light and accommodation. No scleral icterus. Extraocular muscles intact.  HEENT: Head atraumatic, normocephalic. Oropharynx and nasopharynx clear.  NECK:  Supple, no jugular venous distention. No thyroid enlargement, no tenderness.  LUNGS: Normal breath sounds bilaterally, no wheezing, rales,rhonchi or crepitation. No use of accessory muscles of respiration.   CARDIOVASCULAR: S1, S2 normal. No murmurs, rubs, or gallops.  ABDOMEN: Soft, nontender, nondistended. Bowel sounds present. No organomegaly or mass.  EXTREMITIES: No pedal edema, cyanosis, or clubbing.  NEUROLOGIC: Cranial nerves II through XII are intact. Muscle strength 5/5 in all extremities. Sensation intact. Gait not checked.  PSYCHIATRIC: The patient is alert and oriented x 3.  SKIN: No obvious rash, lesion, or ulcer.   Physical Exam LABORATORY PANEL:   CBC  Recent Labs Lab 04/19/15 0401 04/19/15 0918  WBC 7.7  --   HGB 7.2* 7.0*  HCT 21.9* 21.0*  PLT 277  --    ------------------------------------------------------------------------------------------------------------------  Chemistries   Recent Labs Lab 04/18/15 1856 04/19/15 0401  NA 138 137  K 5.9* 5.2*  CL 97* 99*  CO2 16* 22  GLUCOSE 24* 104*  BUN 58* 65*  CREATININE 7.27* 7.91*  CALCIUM 9.7 8.8*  MG  --  2.1  AST 66*  --   ALT 48  --   ALKPHOS 133*  --   BILITOT 3.1*  --    ------------------------------------------------------------------------------------------------------------------  Cardiac Enzymes  Recent Labs Lab 04/18/15 1856 04/18/15 2138  TROPONINI 0.13* 0.15*   ------------------------------------------------------------------------------------------------------------------  RADIOLOGY:  Dg Chest Portable 1 View  04/18/2015  CLINICAL DATA:  Coffee ground emesis EXAM: PORTABLE CHEST 1 VIEW COMPARISON:  04/04/2015 FINDINGS: Cardiac shadow remains enlarged. A moderate-sized left-sided pleural effusion is again identified and stable. Underlying infiltrate is likely present. Mild vascular congestion remains. No new focal infiltrate is seen. IMPRESSION: Cardiomegaly and vascular congestion consistent with CHF. Stable left basilar infiltrate and effusion. Electronically Signed   By: Linus Mako.D.  On: 04/18/2015 19:20    ASSESSMENT AND PLAN:   Principal Problem:    Hypoglycemia Active Problems:   Hyperkalemia   Upper GI bleed   Elevated troponin   ESRD on hemodialysis (HCC)   Volume depletion, gastrointestinal loss   Uncontrolled hypertension  1). Severe hypoglycemia - Patient presented with severe hypoglycemia with initial glucose of 11 and subsequent rechecks were 13 and 24. Glucose is currently stable on dextrose drip - Patient previously presented to the ED about 2 weeks ago with severe hypoglycemia. Etiology is unclear.  - She denies Hx of Diabetes, HBA1c is normal. Check insulin and C peptide. - Patient has been started on D10 infusion and blood sugar is stable.Marland Kitchen  2). Hypokalemia - Patient's potassium is 5.9. EKG does not show any peaked T waves. - Patient given 15 g of Kayexalate.  - Had HD Today.  3). Possible upper GI bleed - Patient has been having coffee-ground emesis all day yesterday - Patient's hemoglobin was 8.5. -  Later dropped to 7.0 , consult GI for possible endoscopy in the morning - Patient is on liquids orally now as no more vomoting. - Continue pantoprazole and Zofran.  4). ESRD on hemodialysis - Patient is on hemodialysis on Tuesday Thursday Saturday . She missed her dialysis  due to significant nausea and vomiting. - Nephrology has been consulted .  5). Volume depletion - Patient is significantly volume depleted clinically likely due to intractable vomiting. - Gentle IV fluid bolus given due to patient's CHF and ESRD. - Monitor fluid status closely - Hold Lasix  6). Elevated troponin - Troponin is elevated at 0.13. This is likely due to end-stage renal disease. We will however check troponin one more time to rule out ACS. Patient doesn't have chest pain. - Continue metoprolol  7). Uncontrolled hypertension - Continue home blood pressure medications and add IV hydralazine when necessary for elevated blood pressure.    All the records are reviewed and case discussed with Care Management/Social  Workerr. Management plans discussed with the patient, family and they are in agreement.  CODE STATUS: Full  TOTAL TIME TAKING CARE OF THIS PATIENT: 35 minutes.    POSSIBLE D/C IN 2-3 DAYS, DEPENDING ON CLINICAL CONDITION.   Vaughan Basta M.D on 04/19/2015   Between 7am to 6pm - Pager - [phone]  After 6pm go to [email or web address] - password [email or web address] [facility]  [place] [facility]  Office  [phone]  CC: Primary care physician; No primary care provider on file.  Note: This dictation was prepared with Dragon dictation along with smaller phrase technology. Any transcriptional errors that result from this process are unintentional.

## 2015-04-19 NOTE — Progress Notes (Signed)
PRE HD   

## 2015-04-19 NOTE — Care Management (Signed)
Patient well known to this CM.  She has had 5 admissions in last 6 months and refuses any services at discharge.  Was not  accepted for [facility] referral either.  Known chronic hemodialysis

## 2015-04-19 NOTE — Progress Notes (Signed)
[facility]  ROUNDING NOTE   Subjective:   Admitted for shortness of breath, hematemesis. Now with hemoglobin dropped to 7. Given kayexalate and calcium gluconate in ED due to hyperkalemia  Found to be hypoglycemic. Now on D10W  Objective:  Vital signs in last 24 hours:  Temp:  [97.4 F (36.3 C)-98 F (36.7 C)] 97.7 F (36.5 C) (03/31 0905) Pulse Rate:  [86-108] 96 (03/31 1100) Resp:  [18-34] 20 (03/31 1100) BP: (134-177)/(82-118) 142/88 mmHg (03/31 1100) SpO2:  [96 %-100 %] 100 % (03/31 1100) Weight:  [81.421 kg (179 lb 8 oz)-89.359 kg (197 lb)] 84.5 kg (186 lb 4.6 oz) (03/31 0905)  Weight change:  Filed Weights   04/18/15 1852 04/18/15 2328 04/19/15 0905  Weight: 89.359 kg (197 lb) 81.421 kg (179 lb 8 oz) 84.5 kg (186 lb 4.6 oz)    Intake/Output:     Intake/Output this shift:     Physical Exam: General: NAD, laying in bed  Head: Normocephalic, atraumatic. Moist oral mucosal membranes, hard of hearing  Eyes: Anicteric, PERRL  Neck: Supple, trachea midline  Lungs:  Clear to auscultation  Heart: Regular rate and rhythm  Abdomen:  Soft, nontender,   Extremities: no peripheral edema.  Neurologic: Nonfocal, moving all four extremities  Skin: No lesions  Access: Right arm AVG    Basic Metabolic Panel:  Recent Labs Lab 04/18/15 1856 04/19/15 0401  NA 138 137  K 5.9* 5.2*  CL 97* 99*  CO2 16* 22  GLUCOSE 24* 104*  BUN 58* 65*  CREATININE 7.27* 7.91*  CALCIUM 9.7 8.8*  MG  --  2.1    Liver Function Tests:  Recent Labs Lab 04/18/15 1856  AST 66*  ALT 48  ALKPHOS 133*  BILITOT 3.1*  PROT 8.0  ALBUMIN 3.9   No results for input(s): LIPASE, AMYLASE in the last 168 hours. No results for input(s): AMMONIA in the last 168 hours.  CBC:  Recent Labs Lab 04/18/15 1856 04/19/15 0401 04/19/15 0918  WBC 8.0 7.7  --   HGB 8.5* 7.2* 7.0*  HCT 26.0* 21.9* 21.0*  MCV 78.4* 77.3*  --   PLT 327 277  --     Cardiac Enzymes:  Recent  Labs Lab 04/18/15 1856 04/18/15 2138  TROPONINI 0.13* 0.15*    BNP: Invalid input(s): POCBNP  CBG:  Recent Labs Lab 04/18/15 1915 04/18/15 2011 04/18/15 2339 04/19/15 0406 04/19/15 0757  GLUCAP 118* 52 82 102* 100*    Microbiology: Results for orders placed or performed during the hospital encounter of 04/01/15  MRSA PCR Screening     Status: None   Collection Time: 04/02/15  3:32 AM  Result Value Ref Range Status   MRSA by PCR NEGATIVE NEGATIVE Final    Comment:        The GeneXpert MRSA Assay (FDA approved for NASAL specimens only), is one component of a comprehensive MRSA colonization surveillance program. It is not intended to diagnose MRSA infection nor to guide or monitor treatment for MRSA infections.     Coagulation Studies:  Recent Labs  04/18/15 1856  LABPROT 20.2*  INR 1.73    Urinalysis: No results for input(s): COLORURINE, LABSPEC, PHURINE, GLUCOSEU, HGBUR, BILIRUBINUR, KETONESUR, PROTEINUR, UROBILINOGEN, NITRITE, LEUKOCYTESUR in the last 72 hours.  Invalid input(s): APPERANCEUR    Imaging: Dg Chest Portable 1 View  04/18/2015  CLINICAL DATA:  Coffee ground emesis EXAM: PORTABLE CHEST 1 VIEW COMPARISON:  04/04/2015 FINDINGS: Cardiac shadow remains enlarged. A moderate-sized left-sided pleural effusion  is again identified and stable. Underlying infiltrate is likely present. Mild vascular congestion remains. No new focal infiltrate is seen. IMPRESSION: Cardiomegaly and vascular congestion consistent with CHF. Stable left basilar infiltrate and effusion. Electronically Signed   By: Inez Catalina M.D.   On: 04/18/2015 19:20     Medications:   . dextrose 50 mL/hr at 04/18/15 2349   . amiodarone  200 mg Oral Daily  . amLODipine  5 mg Oral Daily  . calcium acetate  1,334 mg Oral TID WC  . cinacalcet  60 mg Oral Q breakfast  . fluticasone furoate-vilanterol  1 puff Inhalation Daily  . isosorbide mononitrate  30 mg Oral Daily  . metoprolol  tartrate  12.5 mg Oral BID  . pantoprazole (PROTONIX) IV  40 mg Intravenous Q12H   albuterol, hydrALAZINE, ipratropium-albuterol, ondansetron  Assessment/ Plan:  Ms. MANVIR BUAN is a 60 y.o. 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 End Stage Renal Disease: TTS [facility] [place]  Missed dialysis yesterday. Now with hyperkalemia - seen and examined on hemodialysis. Tolerating treatment well.   2. Anemia of chronic kidney disease: hemoglobin 7 with hematemesis.  - transfusion as per Hospitalist. Appreciate GI input.  - epo with treatment  3. Secondary Hyperparathyroidism: PTH >2600 on 3/23 as outpatient - Cinacalcet - calcium acetate for binding.   4. Hypertension: elevated on admission and during treatment - amlodipine, metoprolol and imdur ordered.    LOS: 1 Nury Nebergall 3/31/201711:30 AM

## 2015-04-19 NOTE — Consult Note (Signed)
Patient with CHF, very elevated BNP of over 4500 with mildly elevated troponin.  She has insp rales on left field which is the dependent field on exam.. CXR with left sided findings.  She is a high risk patient for any elective endoscopy.  Hgb 7, needs transfusion.  Elevated Total bilirubin, maybe cirrhosis, maybe right heart failure, possible hemolysis.  Will follow with you but EGD cancelled.

## 2015-04-20 ENCOUNTER — Encounter
Admission: EM | Disposition: A | Discharge status: Home Health | Payer: Self-pay | Source: Home / Self Care | Attending: Internal Medicine

## 2015-04-20 LAB — GLUCOSE, CAPILLARY
GLUCOSE-CAPILLARY: 119 mg/dL — AB (ref 65–99)
Glucose-Capillary: 106 mg/dL — ABNORMAL HIGH (ref 65–99)
Glucose-Capillary: 114 mg/dL — ABNORMAL HIGH (ref 65–99)
Glucose-Capillary: 138 mg/dL — ABNORMAL HIGH (ref 65–99)
Glucose-Capillary: 98 mg/dL (ref 65–99)

## 2015-04-20 LAB — MISC LABCORP TEST (SEND OUT): LABCORP TEST CODE: 1453

## 2015-04-20 LAB — TYPE AND SCREEN
ABO/RH(D): O NEG
ANTIBODY SCREEN: NEGATIVE
Unit division: 0

## 2015-04-20 LAB — RENAL FUNCTION PANEL
ALBUMIN: 3.3 g/dL — AB (ref 3.5–5.0)
Anion gap: 9 (ref 5–15)
BUN: 33 mg/dL — ABNORMAL HIGH (ref 6–20)
CHLORIDE: 96 mmol/L — AB (ref 101–111)
CO2: 29 mmol/L (ref 22–32)
CREATININE: 5.56 mg/dL — AB (ref 0.44–1.00)
Calcium: 8.6 mg/dL — ABNORMAL LOW (ref 8.9–10.3)
GFR, EST AFRICAN AMERICAN: 9 mL/min — AB (ref >60)
GFR, EST NON AFRICAN AMERICAN: 8 mL/min — AB (ref >60)
Glucose, Bld: 93 mg/dL (ref 65–99)
PHOSPHORUS: 4.9 mg/dL — AB (ref 2.5–4.6)
POTASSIUM: 4.2 mmol/L (ref 3.5–5.1)
Sodium: 134 mmol/L — ABNORMAL LOW (ref 135–145)

## 2015-04-20 LAB — CBC
HEMATOCRIT: 23.8 % — AB (ref 35.0–47.0)
Hemoglobin: 7.8 g/dL — ABNORMAL LOW (ref 12.0–16.0)
MCH: 26 pg (ref 26.0–34.0)
MCHC: 33 g/dL (ref 32.0–36.0)
MCV: 78.8 fL — AB (ref 80.0–100.0)
PLATELETS: 257 10*3/uL (ref 150–440)
RBC: 3.02 MIL/uL — AB (ref 3.80–5.20)
RDW: 20.8 % — ABNORMAL HIGH (ref 11.5–14.5)
WBC: 5.3 10*3/uL (ref 3.6–11.0)

## 2015-04-20 LAB — INSULIN AND C-PEPTIDE, SERUM
C PEPTIDE: 8.2 ng/mL — AB (ref 1.1–4.4)
INSULIN: 6.7 u[IU]/mL (ref 2.6–24.9)

## 2015-04-20 SURGERY — EGD (ESOPHAGOGASTRODUODENOSCOPY)
Anesthesia: General | Laterality: Left

## 2015-04-20 MED ORDER — DEXTROSE 5 % IV SOLN
INTRAVENOUS | Status: DC
  Administered 2015-04-20: 18:00:00 via INTRAVENOUS

## 2015-04-20 NOTE — Progress Notes (Signed)
[facility]  ROUNDING NOTE   Subjective:   Short hemodialysis treatment yesterday. Tolerated treatment well. UF 1.5 litres.  Hemodialysis treatment for later today.   D10W - gluose now at goal  Endoscopy cancelled  Objective:  Vital signs in last 24 hours:  Temp:  [97.2 F (36.2 C)-98.2 F (36.8 C)] 98.2 F (36.8 C) (04/01 0411) Pulse Rate:  [81-96] 88 (04/01 0411) Resp:  [10-25] 18 (04/01 0411) BP: (135-164)/(82-110) 154/108 mmHg (04/01 0411) SpO2:  [93 %-100 %] 93 % (04/01 0411) Weight:  [83.3 kg (183 lb 10.3 oz)] 83.3 kg (183 lb 10.3 oz) (03/31 1230)  Weight change: -4.859 kg (-10 lb 11.4 oz) Filed Weights   04/18/15 2328 04/19/15 0905 04/19/15 1230  Weight: 81.421 kg (179 lb 8 oz) 84.5 kg (186 lb 4.6 oz) 83.3 kg (183 lb 10.3 oz)    Intake/Output: I/O last 3 completed shifts: In: 860 [I.V.:250; Blood:610] Out: 1500 [Other:1500]   Intake/Output this shift:     Physical Exam: General: NAD, laying in bed  Head: Normocephalic, atraumatic. Moist oral mucosal membranes, hard of hearing  Eyes: Anicteric, PERRL  Neck: Supple, trachea midline  Lungs:  Clear to auscultation  Heart: Regular rate and rhythm  Abdomen:  Soft, nontender,   Extremities: no peripheral edema.  Neurologic: Nonfocal, moving all four extremities  Skin: No lesions  Access: Right arm AVG    Basic Metabolic Panel:  Recent Labs Lab 04/18/15 1856 04/19/15 0401  NA 138 137  K 5.9* 5.2*  CL 97* 99*  CO2 16* 22  GLUCOSE 24* 104*  BUN 58* 65*  CREATININE 7.27* 7.91*  CALCIUM 9.7 8.8*  MG  --  2.1    Liver Function Tests:  Recent Labs Lab 04/18/15 1856  AST 66*  ALT 48  ALKPHOS 133*  BILITOT 3.1*  PROT 8.0  ALBUMIN 3.9   No results for input(s): LIPASE, AMYLASE in the last 168 hours. No results for input(s): AMMONIA in the last 168 hours.  CBC:  Recent Labs Lab 04/18/15 1856 04/19/15 0401 04/19/15 0918  WBC 8.0 7.7  --   HGB 8.5* 7.2* 7.0*  HCT 26.0*  21.9* 21.0*  MCV 78.4* 77.3*  --   PLT 327 277  --     Cardiac Enzymes:  Recent Labs Lab 04/18/15 1856 04/18/15 2138  TROPONINI 0.13* 0.15*    BNP: Invalid input(s): POCBNP  CBG:  Recent Labs Lab 04/19/15 1615 04/19/15 2051 04/20/15 0025 04/20/15 0413 04/20/15 0803  GLUCAP 113* 131* 114* 106* 98    Microbiology: Results for orders placed or performed during the hospital encounter of 04/01/15  MRSA PCR Screening     Status: None   Collection Time: 04/02/15  3:32 AM  Result Value Ref Range Status   MRSA by PCR NEGATIVE NEGATIVE Final    Comment:        The GeneXpert MRSA Assay (FDA approved for NASAL specimens only), is one component of a comprehensive MRSA colonization surveillance program. It is not intended to diagnose MRSA infection nor to guide or monitor treatment for MRSA infections.     Coagulation Studies:  Recent Labs  04/18/15 1856  LABPROT 20.2*  INR 1.73    Urinalysis: No results for input(s): COLORURINE, LABSPEC, PHURINE, GLUCOSEU, HGBUR, BILIRUBINUR, KETONESUR, PROTEINUR, UROBILINOGEN, NITRITE, LEUKOCYTESUR in the last 72 hours.  Invalid input(s): APPERANCEUR    Imaging: Dg Chest Portable 1 View  04/18/2015  CLINICAL DATA:  Coffee ground emesis EXAM: PORTABLE CHEST 1 VIEW COMPARISON:  04/04/2015 FINDINGS: Cardiac shadow remains enlarged. A moderate-sized left-sided pleural effusion is again identified and stable. Underlying infiltrate is likely present. Mild vascular congestion remains. No new focal infiltrate is seen. IMPRESSION: Cardiomegaly and vascular congestion consistent with CHF. Stable left basilar infiltrate and effusion. Electronically Signed   By: Inez Catalina M.D.   On: 04/18/2015 19:20     Medications:   . dextrose 60 mL/hr at 04/20/15 0904   . amiodarone  200 mg Oral Daily  . amLODipine  5 mg Oral Daily  . calcium acetate  1,334 mg Oral TID WC  . cinacalcet  60 mg Oral Q breakfast  . fluticasone  furoate-vilanterol  1 puff Inhalation Daily  . isosorbide mononitrate  30 mg Oral Daily  . metoprolol tartrate  12.5 mg Oral BID  . pantoprazole (PROTONIX) IV  40 mg Intravenous Q12H   albuterol, hydrALAZINE, ipratropium-albuterol, ondansetron  Assessment/ Plan:  [name] is a 60 y.o. 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 End Stage Renal Disease: TTS [facility] With hyperkalemia. Short treatment yesterday.  - Plan on hemodialysis today. Orders prepared. Continue TTS schedule  2. Anemia of chronic kidney disease: hemoglobin 7 with hematemesis.  - transfusion as per Hospitalist. Appreciate GI input.  - epo with treatment  3. Secondary Hyperparathyroidism: PTH >2600 on 3/23 as outpatient - Cinacalcet - calcium acetate for binding.   4. Hypertension: elevated - amlodipine, metoprolol and imdur .    LOS: 2 [name] 4/1/20179:07 AM

## 2015-04-20 NOTE — Consult Note (Signed)
GI Inpatient Follow-up Note  Patient Identification: Kelsey Peterson is a 60 y.o. female with ESRD on dialysis, heme positive stool, anemia  Subjective: Kelsey Peterson reports that she is very sleep today.  She denies any pain, reports SOB but states that is her baseline.  She received a unit of blood yesterday, dialysis yesterday and today.    Denies any further vomiting.  Scheduled Inpatient Medications:  . amiodarone  200 mg Oral Daily  . amLODipine  5 mg Oral Daily  . calcium acetate  1,334 mg Oral TID WC  . cinacalcet  60 mg Oral Q breakfast  . fluticasone furoate-vilanterol  1 puff Inhalation Daily  . isosorbide mononitrate  30 mg Oral Daily  . metoprolol tartrate  12.5 mg Oral BID  . pantoprazole (PROTONIX) IV  40 mg Intravenous Q12H    Continuous Inpatient Infusions:   . dextrose 60 mL/hr at 04/20/15 0904    PRN Inpatient Medications:  albuterol, hydrALAZINE, ipratropium-albuterol, ondansetron  Review of Systems: Constitutional: Weight is stable.  Eyes: No changes in vision. ENT: No oral lesions, sore throat.  GI: see HPI.  Heme/Lymph: No easy bruising.  CV: No chest pain.  GU: No hematuria.  Integumentary: No rashes.  Neuro: No headaches.  Psych: No depression/anxiety.  Endocrine: No heat/cold intolerance.  Allergic/Immunologic: No urticaria.  Resp: No cough  Musculoskeletal: No joint swelling.    Physical Examination: BP 153/99 mmHg  Pulse 88  Temp(Src) 97.9 F (36.6 C) (Oral)  Resp 18  Ht 5\' 2"  (1.575 m)  Wt 86.9 kg (191 lb 9.3 oz)  BMI 35.03 kg/m2  SpO2 93% Gen: NAD, alert and oriented x 4, used written communication to ask questions HEENT: PEERLA, EOMI, Neck: supple, no JVD or thyromegaly Chest: lower left rales, decreased breath sounds on left side CV: RRR, no m/g/c/r Abd: soft, NT, ND, +BS in all four quadrants; no HSM, guarding, ridigity, or rebound tenderness Ext: no edema, well perfused with 2+ pulses, Skin: no rash or lesions  noted Lymph: no LAD  Data: Lab Results  Component Value Date   WBC 5.3 04/20/2015   HGB 7.8* 04/20/2015   HCT 23.8* 04/20/2015   MCV 78.8* 04/20/2015   PLT 257 04/20/2015    Recent Labs Lab 04/19/15 0401 04/19/15 0918 04/20/15 0958  HGB 7.2* 7.0* 7.8*   Lab Results  Component Value Date   NA 134* 04/20/2015   K 4.2 04/20/2015   CL 96* 04/20/2015   CO2 29 04/20/2015   BUN 33* 04/20/2015   CREATININE 5.56* 04/20/2015   Lab Results  Component Value Date   ALT 48 04/18/2015   AST 66* 04/18/2015   ALKPHOS 133* 04/18/2015   BILITOT 3.1* 04/18/2015    Recent Labs Lab 04/18/15 1856  APTT 33  INR 1.73   Assessment/Plan: Kelsey Peterson is a 60 y.o. female with heme positive stool, coffee ground emesis (resolved), anemia.  Has received 1 unit of blood.  Hgb at 10am today 7.8.  She does have elevated BNP, however, this appears to be chronic.  Recommendations: No new GI recommendations at this time.  We will continue to follow with you. Please call with questions or concerns.  Salvadore Farber, PA-C  I personally performed these services.

## 2015-04-21 LAB — CBC
HEMATOCRIT: 24.4 % — AB (ref 35.0–47.0)
HEMOGLOBIN: 8 g/dL — AB (ref 12.0–16.0)
MCH: 26 pg (ref 26.0–34.0)
MCHC: 32.6 g/dL (ref 32.0–36.0)
MCV: 79.8 fL — AB (ref 80.0–100.0)
PLATELETS: 249 10*3/uL (ref 150–440)
RBC: 3.06 MIL/uL — AB (ref 3.80–5.20)
RDW: 21.4 % — ABNORMAL HIGH (ref 11.5–14.5)
WBC: 5.1 10*3/uL (ref 3.6–11.0)

## 2015-04-21 LAB — GLUCOSE, CAPILLARY
GLUCOSE-CAPILLARY: 103 mg/dL — AB (ref 65–99)
Glucose-Capillary: 101 mg/dL — ABNORMAL HIGH (ref 65–99)
Glucose-Capillary: 110 mg/dL — ABNORMAL HIGH (ref 65–99)
Glucose-Capillary: 181 mg/dL — ABNORMAL HIGH (ref 65–99)
Glucose-Capillary: 91 mg/dL (ref 65–99)
Glucose-Capillary: 98 mg/dL (ref 65–99)

## 2015-04-21 MED ORDER — PREDNISONE 50 MG PO TABS
50.0000 mg | ORAL_TABLET | Freq: Once | ORAL | Status: AC
  Administered 2015-04-21: 50 mg via ORAL
  Filled 2015-04-21: qty 1

## 2015-04-21 MED ORDER — DIPHENHYDRAMINE HCL 25 MG PO CAPS
50.0000 mg | ORAL_CAPSULE | Freq: Once | ORAL | Status: AC
  Administered 2015-04-22: 50 mg via ORAL
  Filled 2015-04-21: qty 2

## 2015-04-21 MED ORDER — PREDNISONE 50 MG PO TABS
50.0000 mg | ORAL_TABLET | Freq: Once | ORAL | Status: AC
  Administered 2015-04-22: 50 mg via ORAL
  Filled 2015-04-21: qty 1

## 2015-04-21 MED ORDER — DEXTROSE-NACL 5-0.9 % IV SOLN
INTRAVENOUS | Status: DC
  Administered 2015-04-21 – 2015-04-22 (×2): via INTRAVENOUS

## 2015-04-21 NOTE — Progress Notes (Signed)
[facility] NAME: Kelsey Peterson    MR#:  [id number]  DATE OF BIRTH:  November 29, 1955  SUBJECTIVE:  CHIEF COMPLAINT:   Chief Complaint  Patient presents with  . Hematemesis   No more vomits, had some dark stool, Hb stable after teransfusion, tolerating soft diet and asking for upgrade. REVIEW OF SYSTEMS:  CONSTITUTIONAL: No fever, fatigue or weakness.  EYES: No blurred or double vision.  EARS, NOSE, AND THROAT: No tinnitus or ear pain.  RESPIRATORY: No cough, shortness of breath, wheezing or hemoptysis.  CARDIOVASCULAR: No chest pain, orthopnea, edema.  GASTROINTESTINAL: positive nausea, vomiting,no diarrhea or abdominal pain. Dark vomit. GENITOURINARY: No dysuria, hematuria.  ENDOCRINE: No polyuria, nocturia,  HEMATOLOGY: No anemia, easy bruising or bleeding SKIN: No rash or lesion. MUSCULOSKELETAL: No joint pain or arthritis.   NEUROLOGIC: No tingling, numbness, weakness.  PSYCHIATRY: No anxiety or depression.   ROS  DRUG ALLERGIES:   Allergies  Allergen Reactions  . Contrast Media [Iodinated Diagnostic Agents] Other (See Comments)    Unknown reaction  . Morphine And Related Hives  . Other Rash    Blood Pressure Pill (Pt doesn't remember name)     VITALS:  Blood pressure 132/85, pulse 82, temperature 98 F (36.7 C), temperature source Oral, resp. rate 16, height 5\' 2"  (1.575 m), weight 86.9 kg (191 lb 9.3 oz), SpO2 97 %.  PHYSICAL EXAMINATION:  GENERAL:  60 y.o.-year-old patient lying in the bed with no acute distress.  EYES: Pupils equal, round, reactive to light and accommodation. No scleral icterus. Extraocular muscles intact.  HEENT: Head atraumatic, normocephalic. Oropharynx and nasopharynx clear.  NECK:  Supple, no jugular venous distention. No thyroid enlargement, no tenderness.  LUNGS: Normal breath sounds bilaterally, no wheezing, rales,rhonchi or crepitation. No use of accessory muscles of  respiration.  CARDIOVASCULAR: S1, S2 normal. No murmurs, rubs, or gallops.  ABDOMEN: Soft, nontender, nondistended. Bowel sounds present. No organomegaly or mass.  EXTREMITIES: No pedal edema, cyanosis, or clubbing.  NEUROLOGIC: Cranial nerves II through XII are intact. Muscle strength 5/5 in all extremities. Sensation intact. Gait not checked.  PSYCHIATRIC: The patient is alert and oriented x 3.  SKIN: No obvious rash, lesion, or ulcer.   Physical Exam LABORATORY PANEL:   CBC  Recent Labs Lab 04/21/15 0537  WBC 5.1  HGB 8.0*  HCT 24.4*  PLT 249   ------------------------------------------------------------------------------------------------------------------  Chemistries   Recent Labs Lab 04/18/15 1856 04/19/15 0401 04/20/15 0958  NA 138 137 134*  K 5.9* 5.2* 4.2  CL 97* 99* 96*  CO2 16* 22 29  GLUCOSE 24* 104* 93  BUN 58* 65* 33*  CREATININE 7.27* 7.91* 5.56*  CALCIUM 9.7 8.8* 8.6*  MG  --  2.1  --   AST 66*  --   --   ALT 48  --   --   ALKPHOS 133*  --   --   BILITOT 3.1*  --   --    ------------------------------------------------------------------------------------------------------------------  Cardiac Enzymes  Recent Labs Lab 04/18/15 1856 04/18/15 2138  TROPONINI 0.13* 0.15*   ------------------------------------------------------------------------------------------------------------------  RADIOLOGY:  No results found.  ASSESSMENT AND PLAN:   Principal Problem:   Hypoglycemia Active Problems:   Hyperkalemia   Upper GI bleed   Elevated troponin   ESRD on hemodialysis (HCC)   Volume depletion, gastrointestinal loss   Uncontrolled hypertension  1). Severe hypoglycemia - Patient presented with severe hypoglycemia with initial glucose of 11 and subsequent rechecks were  13 and 24. Glucose is currently stable on dextrose drip- decrease to 30 ml / hour. - Patient previously presented to the ED about 2 weeks ago with severe hypoglycemia.  Etiology is unclear.  - She denies Hx of Diabetes, HBA1c is normal. Checked insulin and C peptide- higher. - Blood sugar stable on D5 drip.  2). Hypokalemia - Patient's potassium is 5.9. EKG does not show any peaked T waves. - Patient given 15 g of Kayexalate.  - Had HD Today.  3). Possible upper GI bleed - Patient has been having coffee-ground emesis all day yesterday - Patient's hemoglobin was 8.5. -  Later dropped to 7.0 , consult GI - suggested no endoscopy. - Patient is on liquids orally now as no more vomoting.- tolerated soft diet also- will give regular food now. - Continue pantoprazole and Zofran.  4). ESRD on hemodialysis - Patient is on hemodialysis on Tuesday Thursday Saturday . She missed her dialysis  due to significant nausea and vomiting. - Nephrology has been consulted .  5). Volume depletion - Patient is significantly volume depleted clinically likely due to intractable vomiting. - Gentle IV fluid bolus given due to patient's CHF and ESRD. - Monitor fluid status closely - Hold Lasix - now appears well hydrated.  6). Elevated troponin - Troponin is elevated at 0.13. This is likely due to end-stage renal disease. We will however check troponin one more time to rule out ACS. Patient doesn't have chest pain. - Continue metoprolol  7). Uncontrolled hypertension - Continue home blood pressure medications and add IV hydralazine when necessary for elevated blood pressure.    All the records are reviewed and case discussed with Care Management/Social Workerr. Management plans discussed with the patient, family and they are in agreement.  CODE STATUS: Full  TOTAL TIME TAKING CARE OF THIS PATIENT: 35 minutes.  We will wait till her blood sugar is stable without IV drip.   POSSIBLE D/C IN 2-3 DAYS, DEPENDING ON CLINICAL CONDITION.   Vaughan Basta M.D on 04/21/2015   Between 7am to 6pm - Pager - [phone]  After 6pm go to [email or web address] -  password [email or web address] [facility]  [facility]  Office  [phone]  CC: Primary care physician; No primary care provider on file.  Note: This dictation was prepared with Dragon dictation along with smaller phrase technology. Any transcriptional errors that result from this process are unintentional.

## 2015-04-21 NOTE — Consult Note (Signed)
GI Inpatient Follow-up Note  Patient Identification: Kelsey Peterson is a 60 y.o. female with ESRD on dialysis, heme positive stool, anemia  Subjective: Patient reports she has been experiencing abdominal pain, describes it as generalized and crampy.  Does not improve with eating.  She is very concerned about finding out why she was vomiting blood.  No further vomiting  And denies any BM's today.  She is starting a regular diet with lunch.  She has no other complaints at this time.  Scheduled Inpatient Medications:  . amiodarone  200 mg Oral Daily  . amLODipine  5 mg Oral Daily  . calcium acetate  1,334 mg Oral TID WC  . cinacalcet  60 mg Oral Q breakfast  . fluticasone furoate-vilanterol  1 puff Inhalation Daily  . isosorbide mononitrate  30 mg Oral Daily  . metoprolol tartrate  12.5 mg Oral BID  . pantoprazole (PROTONIX) IV  40 mg Intravenous Q12H    Continuous Inpatient Infusions:     PRN Inpatient Medications:  albuterol, hydrALAZINE, ipratropium-albuterol, ondansetron  Review of Systems: Constitutional: Weight is stable.  Eyes: No changes in vision. ENT: No oral lesions, sore throat.  GI: see HPI.  Heme/Lymph: No easy bruising.  CV: No chest pain.  GU: No hematuria.  Integumentary: No rashes.  Neuro: No headaches.  Psych: No depression/anxiety.  Endocrine: No heat/cold intolerance.  Allergic/Immunologic: No urticaria.  Resp: No cough, SOB.  Musculoskeletal: No joint swelling.    Physical Examination: BP 136/90 mmHg  Pulse 83  Temp(Src) 97.6 F (36.4 C) (Oral)  Resp 18  Ht 5\' 2"  (1.575 m)  Wt 86.9 kg (191 lb 9.3 oz)  BMI 35.03 kg/m2  SpO2 95% Gen: NAD, alert and oriented x 4, communicated with questions in writing HEENT: PEERLA, EOMI, Neck: supple, no JVD or thyromegaly Chest: lower left rales, decreased breath sounds on the left side, hard to get her to take a deep breath CV: RRR, no m/g/c/r Abd: soft, tender in epigastric area and bilateral  lower, ND, +BS in all four quadrants; no HSM, guarding, ridigity, or rebound tenderness Ext: no edema, well perfused with 2+ pulses, Skin: no rash or lesions noted Lymph: no LAD  Data: Lab Results  Component Value Date   WBC 5.1 04/21/2015   HGB 8.0* 04/21/2015   HCT 24.4* 04/21/2015   MCV 79.8* 04/21/2015   PLT 249 04/21/2015    Recent Labs Lab 04/19/15 0918 04/20/15 0958 04/21/15 0537  HGB 7.0* 7.8* 8.0*   Lab Results  Component Value Date   NA 134* 04/20/2015   K 4.2 04/20/2015   CL 96* 04/20/2015   CO2 29 04/20/2015   BUN 33* 04/20/2015   CREATININE 5.56* 04/20/2015   Lab Results  Component Value Date   ALT 48 04/18/2015   AST 66* 04/18/2015   ALKPHOS 133* 04/18/2015   BILITOT 3.1* 04/18/2015    Recent Labs Lab 04/18/15 1856  APTT 33  INR 1.73   Assessment/Plan: [name] is a 60 y.o. female with ESRD on dialysis, heme positive stool, anemia.  Hbg stable 8.0 (close to her baseline)    Recommendations: We will proceed with upper endoscopy tomorrow for further evaluation.  She should be NPO after midnight.  We will continue to follow with you. Please call with questions or concerns.  Salvadore Farber, PA-C  I personally performed these services.

## 2015-04-21 NOTE — Progress Notes (Signed)
[facility]  ROUNDING NOTE   Subjective:   Hemodialysis treatment yesterday. Tolerated treatment well. UF of 1 litre.   No more nausea/vomiting. However now complaining of constipation.   Continues on D5W for hypoglycemia- diet advanced  Objective:  Vital signs in last 24 hours:  Temp:  [97.5 F (36.4 C)-98.2 F (36.8 C)] 98 F (36.7 C) (04/02 0634) Pulse Rate:  [82-88] 82 (04/02 0634) Resp:  [16] 16 (04/02 0634) BP: (131-153)/(83-99) 132/85 mmHg (04/02 0634) SpO2:  [97 %] 97 % (04/02 0634) Weight:  [86.9 kg (191 lb 9.3 oz)] 86.9 kg (191 lb 9.3 oz) (04/01 0945)  Weight change: 2.4 kg (5 lb 4.7 oz) Filed Weights   04/19/15 0905 04/19/15 1230 04/20/15 0945  Weight: 84.5 kg (186 lb 4.6 oz) 83.3 kg (183 lb 10.3 oz) 86.9 kg (191 lb 9.3 oz)    Intake/Output:     Intake/Output this shift:     Physical Exam: General: NAD, laying in bed  Head: Normocephalic, atraumatic. Moist oral mucosal membranes, hard of hearing  Eyes: Anicteric, PERRL  Neck: Supple, trachea midline  Lungs:  Clear to auscultation  Heart: Regular rate and rhythm  Abdomen:  Soft, nontender,   Extremities: no peripheral edema.  Neurologic: Nonfocal, moving all four extremities  Skin: No lesions  Access: Right arm AVG    Basic Metabolic Panel:  Recent Labs Lab 04/18/15 1856 04/19/15 0401 04/20/15 0958  NA 138 137 134*  K 5.9* 5.2* 4.2  CL 97* 99* 96*  CO2 16* 22 29  GLUCOSE 24* 104* 93  BUN 58* 65* 33*  CREATININE 7.27* 7.91* 5.56*  CALCIUM 9.7 8.8* 8.6*  MG  --  2.1  --   PHOS  --   --  4.9*    Liver Function Tests:  Recent Labs Lab 04/18/15 1856 04/20/15 0958  AST 66*  --   ALT 48  --   ALKPHOS 133*  --   BILITOT 3.1*  --   PROT 8.0  --   ALBUMIN 3.9 3.3*   No results for input(s): LIPASE, AMYLASE in the last 168 hours. No results for input(s): AMMONIA in the last 168 hours.  CBC:  Recent Labs Lab 04/18/15 1856 04/19/15 0401 04/19/15 0918 04/20/15 0958  04/21/15 0537  WBC 8.0 7.7  --  5.3 5.1  HGB 8.5* 7.2* 7.0* 7.8* 8.0*  HCT 26.0* 21.9* 21.0* 23.8* 24.4*  MCV 78.4* 77.3*  --  78.8* 79.8*  PLT 327 277  --  257 249    Cardiac Enzymes:  Recent Labs Lab 04/18/15 1856 04/18/15 2138  TROPONINI 0.13* 0.15*    BNP: Invalid input(s): POCBNP  CBG:  Recent Labs Lab 04/20/15 1628 04/20/15 2033 04/21/15 0036 04/21/15 0632 04/21/15 0748  GLUCAP 138* 119* 110* 101* 15    Microbiology: Results for orders placed or performed during the hospital encounter of 04/01/15  MRSA PCR Screening     Status: None   Collection Time: 04/02/15  3:32 AM  Result Value Ref Range Status   MRSA by PCR NEGATIVE NEGATIVE Final    Comment:        The GeneXpert MRSA Assay (FDA approved for NASAL specimens only), is one component of a comprehensive MRSA colonization surveillance program. It is not intended to diagnose MRSA infection nor to guide or monitor treatment for MRSA infections.     Coagulation Studies:  Recent Labs  04/18/15 1856  LABPROT 20.2*  INR 1.73    Urinalysis: No results for input(s):  COLORURINE, LABSPEC, [place], GLUCOSEU, HGBUR, BILIRUBINUR, KETONESUR, PROTEINUR, UROBILINOGEN, NITRITE, LEUKOCYTESUR in the last 72 hours.  Invalid input(s): APPERANCEUR    Imaging: No results found.   Medications:   . dextrose 30 mL/hr at 04/20/15 1746   . amiodarone  200 mg Oral Daily  . amLODipine  5 mg Oral Daily  . calcium acetate  1,334 mg Oral TID WC  . cinacalcet  60 mg Oral Q breakfast  . fluticasone furoate-vilanterol  1 puff Inhalation Daily  . isosorbide mononitrate  30 mg Oral Daily  . metoprolol tartrate  12.5 mg Oral BID  . pantoprazole (PROTONIX) IV  40 mg Intravenous Q12H   albuterol, hydrALAZINE, ipratropium-albuterol, ondansetron  Assessment/ Plan:  Kelsey Peterson is a 60 y.o. black female  with hypertension, anemia of chronic kidney disease, secondary hyperparathyroidism, history of  meningitis as a child with resultant hearing loss,   1. End Stage Renal Disease: TTS [facility] [place] With hyperkalemia on admission requiring emergent dialysis. Last hemodialysis treatment Saturday, tolerated treatment. Potassium and volume status at goal Continue TTS schedule  2. Anemia of chronic kidney disease: hemoglobin 8 with hematemesis. Status post transfusion 3/31 - Appreciate GI input.  - epo with treatment  3. Secondary Hyperparathyroidism: PTH >2600 on 3/23 as outpatient - Cinacalcet - calcium acetate for binding.   4. Hypertension: at goal.  - amlodipine, metoprolol and imdur .    LOS: Kelsey Peterson, Kelsey Peterson 4/2/20178:19 AM

## 2015-04-21 NOTE — Consult Note (Signed)
Spoke to patient about an EGD tomorrow and she very much wants to have it, "I'm not going home until You find out where the bleeding is coming from".  Chest exam better with no rales at this time.  Will do EGD and possible banding tomorrow morning.

## 2015-04-21 NOTE — Progress Notes (Signed)
Patient is refusing to eat anything unless her diet order is changed to eat whatever she wants. Blood sugars have been running around the 90's with dextrose running. Dextrose has now been discontinued and patient has not eaten. Refused to take her medications this morning as well. Dr. Anselm Jungling paged with no response. Dr. Juleen China called and stated the patient can be on a regular diet. Patient is noncompliant with a renal and diabetic diet at home as well. Will update patient and let her know she can order what she would like.

## 2015-04-21 NOTE — Progress Notes (Signed)
[facility] [place] NAME: Kelsey Peterson    MR#:  [id number]  DATE OF BIRTH:  10-20-1955  SUBJECTIVE:  CHIEF COMPLAINT:   Chief Complaint  Patient presents with  . Hematemesis   No more vomits, had some dark stool, Hb stable after teransfusion, tolerated regular food, have some abdominal pain, no relation with food.   REVIEW OF SYSTEMS:  CONSTITUTIONAL: No fever, fatigue or weakness.  EYES: No blurred or double vision.  EARS, NOSE, AND THROAT: No tinnitus or ear pain.  RESPIRATORY: No cough, shortness of breath, wheezing or hemoptysis.  CARDIOVASCULAR: No chest pain, orthopnea, edema.  GASTROINTESTINAL: positive nausea, vomiting,no diarrhea or abdominal pain. Dark vomit. GENITOURINARY: No dysuria, hematuria.  ENDOCRINE: No polyuria, nocturia,  HEMATOLOGY: No anemia, easy bruising or bleeding SKIN: No rash or lesion. MUSCULOSKELETAL: No joint pain or arthritis.   NEUROLOGIC: No tingling, numbness, weakness.  PSYCHIATRY: No anxiety or depression.   ROS  DRUG ALLERGIES:   Allergies  Allergen Reactions  . Contrast Media [Iodinated Diagnostic Agents] Other (See Comments)    Unknown reaction  . Morphine And Related Hives  . Other Rash    Blood Pressure Pill (Pt doesn't remember name)     VITALS:  Blood pressure 136/90, pulse 83, temperature 97.6 F (36.4 C), temperature source Oral, resp. rate 18, height 5\' 2"  (1.575 m), weight 86.9 kg (191 lb 9.3 oz), SpO2 95 %.  PHYSICAL EXAMINATION:  GENERAL:  60 y.o.-year-old patient lying in the bed with no acute distress.  EYES: Pupils equal, round, reactive to light and accommodation. No scleral icterus. Extraocular muscles intact.  HEENT: Head atraumatic, normocephalic. Oropharynx and nasopharynx clear.  NECK:  Supple, no jugular venous distention. No thyroid enlargement, no tenderness.  LUNGS: Normal breath sounds bilaterally, no wheezing, rales,rhonchi or crepitation. No use  of accessory muscles of respiration.  CARDIOVASCULAR: S1, S2 normal. No murmurs, rubs, or gallops.  ABDOMEN: Soft, nontender, nondistended. Bowel sounds present. No organomegaly or mass.  EXTREMITIES: No pedal edema, cyanosis, or clubbing.  NEUROLOGIC: Cranial nerves II through XII are intact. Muscle strength 5/5 in all extremities. Sensation intact. Gait not checked.  PSYCHIATRIC: The patient is alert and oriented x 3.  SKIN: No obvious rash, lesion, or ulcer.   Physical Exam LABORATORY PANEL:   CBC  Recent Labs Lab 04/21/15 0537  WBC 5.1  HGB 8.0*  HCT 24.4*  PLT 249   ------------------------------------------------------------------------------------------------------------------  Chemistries   Recent Labs Lab 04/18/15 1856 04/19/15 0401 04/20/15 0958  NA 138 137 134*  K 5.9* 5.2* 4.2  CL 97* 99* 96*  CO2 16* 22 29  GLUCOSE 24* 104* 93  BUN 58* 65* 33*  CREATININE 7.27* 7.91* 5.56*  CALCIUM 9.7 8.8* 8.6*  MG  --  2.1  --   AST 66*  --   --   ALT 48  --   --   ALKPHOS 133*  --   --   BILITOT 3.1*  --   --    ------------------------------------------------------------------------------------------------------------------  Cardiac Enzymes  Recent Labs Lab 04/18/15 1856 04/18/15 2138  TROPONINI 0.13* 0.15*   ------------------------------------------------------------------------------------------------------------------  RADIOLOGY:  No results found.  ASSESSMENT AND PLAN:   Principal Problem:   Hypoglycemia Active Problems:   Hyperkalemia   Upper GI bleed   Elevated troponin   ESRD on hemodialysis (HCC)   Volume depletion, gastrointestinal loss   Uncontrolled hypertension  1). Severe hypoglycemia - Patient presented with severe hypoglycemia with initial glucose  of 11 and subsequent rechecks were 13 and 24. Glucose is currently stable on dextrose drip- decrease to 30 ml / hour. - Patient previously presented to the ED about 2 weeks ago with  severe hypoglycemia. Etiology is unclear.  - She denies Hx of Diabetes, HBA1c is normal. Checked insulin and C peptide- higher. - Blood sugar stable on D5 drip 30 ml/hr- stopped dextrose drip, and monitor without it. - C peptide is high- so I will get CT abdomen and pelvis with contrast to look for insulinoma today.  2). Hypokalemia - Patient's potassium is 5.9. EKG does not show any peaked T waves. - Patient given 15 g of Kayexalate.  - Had HD - stable now.  3). Possible upper GI bleed - Patient has been having coffee-ground emesis all day yesterday - Patient's hemoglobin was 8.5. -  Later dropped to 7.0 , consult GI - suggested no endoscopy. - Patient is on liquids orally now as no more vomoting.- tolerated soft diet also- will give regular food now. - Continue pantoprazole and Zofran. - pt insisted to know the reason of bleed, so GI agreed to have EGD tomorrow.  4). ESRD on hemodialysis - Patient is on hemodialysis on Tuesday Thursday Saturday . She missed her dialysis  due to significant nausea and vomiting. - Nephrology has been consulted .  5). Volume depletion - Patient is significantly volume depleted clinically likely due to intractable vomiting. - Gentle IV fluid bolus given due to patient's CHF and ESRD. - Monitor fluid status closely - Hold Lasix - now appears well hydrated.  6). Elevated troponin - Troponin is elevated at 0.13. This is likely due to end-stage renal disease. We will however check troponin one more time to rule out ACS. Patient doesn't have chest pain. - Continue metoprolol  7). Uncontrolled hypertension - Continue home blood pressure medications and add IV hydralazine when necessary for elevated blood pressure.    All the records are reviewed and case discussed with Care Management/Social Workerr. Management plans discussed with the patient, family and they are in agreement.  CODE STATUS: Full  TOTAL TIME TAKING CARE OF THIS PATIENT:  35 minutes.  We will wait till her blood sugar is stable without IV drip.   POSSIBLE D/C IN 2-3 DAYS, DEPENDING ON CLINICAL CONDITION.   Vaughan Basta M.D on 04/21/2015   Between 7am to 6pm - Pager - [phone]  After 6pm go to [email or web address] - password [email or web address]  [facility]  Office  [phone]  CC: Primary care physician; No primary care provider on file.  Note: This dictation was prepared with Dragon dictation along with smaller phrase technology. Any transcriptional errors that result from this process are unintentional.

## 2015-04-21 NOTE — Consult Note (Signed)
Pt asleep when I went to see her and I let her sleep.  Discussed with PA Claudie Leach, her PE showed insp rales and hgb of 8, plt 249, Will make her NPO after midnight for possible EGD tomorrow.

## 2015-04-21 NOTE — Progress Notes (Signed)
Patient has rested comfortably today. She has refused all of her medications today, but was willing to take the prednisone. Explained to patient the plan for having the CT scan of her abdomen. Patient states she does not want to be woken up in the middle of the night because she gets very cranky when she is woken up. Consent has already been previously signed for endoscopy and patient has been educated. Will continue to monitor.

## 2015-04-22 ENCOUNTER — Inpatient Hospital Stay: Payer: Medicare Other

## 2015-04-22 ENCOUNTER — Inpatient Hospital Stay: Payer: Medicare Other | Admitting: Certified Registered Nurse Anesthetist

## 2015-04-22 ENCOUNTER — Encounter: Payer: Self-pay | Admitting: Radiology

## 2015-04-22 ENCOUNTER — Encounter
Admission: EM | Disposition: A | Discharge status: Home Health | Payer: Self-pay | Source: Home / Self Care | Attending: Internal Medicine

## 2015-04-22 HISTORY — PX: ESOPHAGOGASTRODUODENOSCOPY: SHX5428

## 2015-04-22 LAB — GLUCOSE, CAPILLARY
GLUCOSE-CAPILLARY: 138 mg/dL — AB (ref 65–99)
GLUCOSE-CAPILLARY: 147 mg/dL — AB (ref 65–99)
Glucose-Capillary: 141 mg/dL — ABNORMAL HIGH (ref 65–99)
Glucose-Capillary: 153 mg/dL — ABNORMAL HIGH (ref 65–99)
Glucose-Capillary: 117 mg/dL — ABNORMAL HIGH (ref 65–99)
Glucose-Capillary: 77 mg/dL (ref 65–99)

## 2015-04-22 LAB — BETA-HYDROXYBUTYRIC ACID: Beta-Hydroxybutyric Acid: 0.92 mmol/L — ABNORMAL HIGH (ref 0.05–0.27)

## 2015-04-22 LAB — GLUCOSE, RANDOM: GLUCOSE: 101 mg/dL — AB (ref 65–99)

## 2015-04-22 SURGERY — EGD (ESOPHAGOGASTRODUODENOSCOPY)
Anesthesia: General

## 2015-04-22 MED ORDER — IPRATROPIUM-ALBUTEROL 0.5-2.5 (3) MG/3ML IN SOLN: 3.0000 mL | Freq: Once | RESPIRATORY_TRACT | Status: DC

## 2015-04-22 MED ORDER — EPOETIN ALFA 10000 UNIT/ML IJ SOLN
4000.0000 [IU] | Freq: Once | INTRAMUSCULAR | Status: AC
Start: 2015-04-22 — End: 2015-04-22
  Administered 2015-04-22: 4000 [IU] via INTRAVENOUS

## 2015-04-22 MED ORDER — ZOLPIDEM TARTRATE 5 MG PO TABS
5.0000 mg | ORAL_TABLET | Freq: Once | ORAL | Status: AC
  Administered 2015-04-22: 5 mg via ORAL
  Filled 2015-04-22: qty 1

## 2015-04-22 MED ORDER — LIDOCAINE HCL (CARDIAC) 20 MG/ML IV SOLN
INTRAVENOUS | Status: DC | PRN
  Administered 2015-04-22: 60 mg via INTRAVENOUS

## 2015-04-22 MED ORDER — BUTAMBEN-TETRACAINE-BENZOCAINE 2-2-14 % EX AERO
INHALATION_SPRAY | CUTANEOUS | Status: AC
  Filled 2015-04-22: qty 20

## 2015-04-22 MED ORDER — PROPOFOL 10 MG/ML IV BOLUS
INTRAVENOUS | Status: DC | PRN
  Administered 2015-04-22 (×2): 20 mg via INTRAVENOUS

## 2015-04-22 MED ORDER — IOPAMIDOL (ISOVUE-300) INJECTION 61%
100.0000 mL | Freq: Once | INTRAVENOUS | Status: AC | PRN
  Administered 2015-04-22: 100 mL via INTRAVENOUS

## 2015-04-22 MED ORDER — BUTAMBEN-TETRACAINE-BENZOCAINE 2-2-14 % EX AERO
INHALATION_SPRAY | CUTANEOUS | Status: DC | PRN
  Administered 2015-04-22: 1 via TOPICAL

## 2015-04-22 MED ORDER — KETOROLAC TROMETHAMINE 30 MG/ML IJ SOLN
30.0000 mg | Freq: Once | INTRAMUSCULAR | Status: DC
  Filled 2015-04-22: qty 1

## 2015-04-22 NOTE — Progress Notes (Signed)
Post HD  

## 2015-04-22 NOTE — Consult Note (Signed)
Patient was seen while in dialysis.  She was examined and interviewed.  Full consult to follow Orders placed.

## 2015-04-22 NOTE — Anesthesia Postprocedure Evaluation (Signed)
Anesthesia Post Note  Patient: Kelsey Peterson  Procedure(s) Performed: Procedure(s) (LRB): ESOPHAGOGASTRODUODENOSCOPY (EGD) (N/A)  Patient location during evaluation: Endoscopy Anesthesia Type: General Level of consciousness: awake and alert Pain management: pain level controlled Vital Signs Assessment: post-procedure vital signs reviewed and stable Respiratory status: spontaneous breathing, nonlabored ventilation, respiratory function stable and patient connected to nasal cannula oxygen Cardiovascular status: blood pressure returned to baseline and stable Postop Assessment: no signs of nausea or vomiting Anesthetic complications: no    Last Vitals:  Filed Vitals:   04/22/15 1157 04/22/15 1158  BP: 156/108   Pulse: 79   Temp:  36.5 C  Resp: 18     Last Pain:  Filed Vitals:   04/22/15 1159  PainSc: Asleep                 Martha Clan

## 2015-04-22 NOTE — Consult Note (Signed)
Few flecks of fresh blood in duodenum, no ulcerations or tumors portal hypertensive gastropathy in stomach, no varices in esophagus or stomach.  Follow her hgb, transfuse as needed, on PPI twice a day.

## 2015-04-22 NOTE — Progress Notes (Signed)
Tx ended    

## 2015-04-22 NOTE — Progress Notes (Signed)
Patient's CBG is 77 this evening. Dinner tray was ordered around 1400 to come at 1700 after patient finished dialysis. Dinner tray was never received. I have called twice now for the tray and haven't received it yet. Given apple juice until meal tray arrives.

## 2015-04-22 NOTE — Progress Notes (Signed)
[facility]  ROUNDING NOTE   Subjective:   Patient had EGD earlier today Currently reports that she is unable to breath well   Objective:  Vital signs in last 24 hours:  Temp:  [97.3 F (36.3 C)-97.7 F (36.5 C)] 97.7 F (36.5 C) (04/03 1158) Pulse Rate:  [77-91] 79 (04/03 1157) Resp:  [7-39] 18 (04/03 1157) BP: (146-198)/(92-116) 156/108 mmHg (04/03 1157) SpO2:  [95 %-100 %] 100 % (04/03 1157)  Weight change:  Filed Weights   04/19/15 0905 04/19/15 1230 04/20/15 0945  Weight: 84.5 kg (186 lb 4.6 oz) 83.3 kg (183 lb 10.3 oz) 86.9 kg (191 lb 9.3 oz)    Intake/Output:     Intake/Output this shift:  Total I/O In: 550 [I.V.:550] Out: 0   Physical Exam: General: NAD, sitting up on side of the bed  Head: Normocephalic, atraumatic. Moist oral mucosal membranes, hard of hearing  Eyes: Anicteric,  Neck: Supple, trachea midline  Lungs:  Diffuse crackles b/l  Heart: regular  Abdomen:  Soft, nontender,   Extremities: no peripheral edema.  Neurologic: Nonfocal, moving all four extremities  Skin: No lesions  Access: Right arm AVG    Basic Metabolic Panel:  Recent Labs Lab 04/18/15 1856 04/19/15 0401 04/20/15 0958  NA 138 137 134*  K 5.9* 5.2* 4.2  CL 97* 99* 96*  CO2 16* 22 29  GLUCOSE 24* 104* 93  BUN 58* 65* 33*  CREATININE 7.27* 7.91* 5.56*  CALCIUM 9.7 8.8* 8.6*  MG  --  2.1  --   PHOS  --   --  4.9*    Liver Function Tests:  Recent Labs Lab 04/18/15 1856 04/20/15 0958  AST 66*  --   ALT 48  --   ALKPHOS 133*  --   BILITOT 3.1*  --   PROT 8.0  --   ALBUMIN 3.9 3.3*   No results for input(s): LIPASE, AMYLASE in the last 168 hours. No results for input(s): AMMONIA in the last 168 hours.  CBC:  Recent Labs Lab 04/18/15 1856 04/19/15 0401 04/19/15 0918 04/20/15 0958 04/21/15 0537  WBC 8.0 7.7  --  5.3 5.1  HGB 8.5* 7.2* 7.0* 7.8* 8.0*  HCT 26.0* 21.9* 21.0* 23.8* 24.4*  MCV 78.4* 77.3*  --  78.8* 79.8*  PLT 327 277  --   257 249    Cardiac Enzymes:  Recent Labs Lab 04/18/15 1856 04/18/15 2138  TROPONINI 0.13* 0.15*    BNP: Invalid input(s): POCBNP  CBG:  Recent Labs Lab 04/21/15 2124 04/22/15 0019 04/22/15 0444 04/22/15 0728 04/22/15 1200  GLUCAP 181* 147* 141* 153* 117*    Microbiology: Results for orders placed or performed during the hospital encounter of 04/01/15  MRSA PCR Screening     Status: None   Collection Time: 04/02/15  3:32 AM  Result Value Ref Range Status   MRSA by PCR NEGATIVE NEGATIVE Final    Comment:        The GeneXpert MRSA Assay (FDA approved for NASAL specimens only), is one component of a comprehensive MRSA colonization surveillance program. It is not intended to diagnose MRSA infection nor to guide or monitor treatment for MRSA infections.     Coagulation Studies: No results for input(s): LABPROT, INR in the last 72 hours.  Urinalysis: No results for input(s): COLORURINE, LABSPEC, PHURINE, GLUCOSEU, HGBUR, BILIRUBINUR, KETONESUR, PROTEINUR, UROBILINOGEN, NITRITE, LEUKOCYTESUR in the last 72 hours.  Invalid input(s): APPERANCEUR    Imaging: Ct Abdomen Pelvis W Wo Contrast  04/22/2015  CLINICAL DATA:  Generalized abdominal pain and vomiting, coffee ground emesis. EXAM: CT ABDOMEN AND PELVIS WITHOUT AND WITH CONTRAST TECHNIQUE: Multidetector CT imaging of the abdomen and pelvis was performed following the standard protocol before and following the bolus administration of intravenous contrast. CONTRAST:  145mL ISOVUE-300 IOPAMIDOL (ISOVUE-300) INJECTION 61% COMPARISON:  01/18/2014. FINDINGS: Lower chest: Lung bases show a loculated moderate left pleural effusion. Small right pleural effusion. Added density in the lung bases may be due to expiratory phase imaging. Minimal compressive atelectasis in the left lower lobe. Heart is enlarged. No pericardial effusion. Hepatobiliary: Liver is decreased in attenuation diffusely. Large stones are seen in the  gallbladder. No definite biliary ductal dilatation. Pancreas: Negative. Spleen: Negative. Adrenals/Urinary Tract: Adrenal glands are unremarkable. Kidneys are atrophic. Low-attenuation lesions in the native kidneys measure up to 13 mm on the right, difficult to characterize due to size. There may be a tiny cystocele. Bladder is otherwise grossly unremarkable. Stomach/Bowel: Tiny hiatal hernia. Stomach, small bowel, appendix and colon are unremarkable. Vascular/Lymphatic: Possible esophageal varices. No pathologically enlarged lymph nodes. Reproductive: Uterus and ovaries are visualized. Other: Small ascites.  Diffuse body wall edema. Musculoskeletal: Dense osseous structures, indicative of chronic renal disease. No worrisome lytic or sclerotic lesions. Degenerative changes are seen in the spine. IMPRESSION: 1. No evidence of a pancreatic lesion. 2. Hepatic steatosis. 3. Ascites and bilateral pleural effusions, left greater than right. 4. Cholelithiasis. Electronically Signed   By: Lorin Picket M.D.   On: 04/22/2015 10:21   Dg Chest Port 1 View  04/22/2015  CLINICAL DATA:  Shortness of Breath EXAM: PORTABLE CHEST 1 VIEW COMPARISON:  04/18/2015 FINDINGS: Cardiac shadow remains enlarged. Persistent left pleural effusion and likely underlying infiltrate is present. Vascular congestion is seen. No acute bony abnormality is noted. IMPRESSION: Vascular congestion consistent with CHF. Persistent left basilar changes are noted. Electronically Signed   By: Inez Catalina M.D.   On: 04/22/2015 01:17     Medications:   . dextrose 5 % and 0.9% NaCl 75 mL/hr at 04/21/15 2203   . amiodarone  200 mg Oral Daily  . amLODipine  5 mg Oral Daily  . butamben-tetracaine-benzocaine      . calcium acetate  1,334 mg Oral TID WC  . cinacalcet  60 mg Oral Q breakfast  . fluticasone furoate-vilanterol  1 puff Inhalation Daily  . ipratropium-albuterol  3 mL Nebulization Once  . isosorbide mononitrate  30 mg Oral Daily  .  ketorolac  30 mg Intravenous Once  . metoprolol tartrate  12.5 mg Oral BID  . pantoprazole (PROTONIX) IV  40 mg Intravenous Q12H   albuterol, hydrALAZINE, ipratropium-albuterol, ondansetron  Assessment/ Plan:  Kelsey Peterson is a 60 y.o. 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 End Stage Renal Disease: TTS [facility] [place] With hyperkalemia on admission requiring emergent dialysis. Last hemodialysis treatment Saturday, tolerated treatment.   Continue TTS schedule Extra HD today since clinically patient appears to be in Pulm edema  2. Anemia of chronic kidney disease: hemoglobin 8 with hematemesis. Status post transfusion 3/31 - Appreciate GI input.  - epo with treatment  3. Secondary Hyperparathyroidism: PTH >2600 on 3/23 as outpatient - Cinacalcet - calcium acetate for binding.      LOS: 4 Kelsey Peterson 4/3/20172:20 PM

## 2015-04-22 NOTE — Anesthesia Preprocedure Evaluation (Signed)
Anesthesia Evaluation  Patient identified by MRN, date of birth, ID band Patient awake    Reviewed: Allergy & Precautions, H&P , NPO status , Patient's Chart, lab work & pertinent test results, reviewed documented beta blocker date and time   History of Anesthesia Complications Negative for: history of anesthetic complications  Airway Mallampati: II  TM Distance: >3 FB Neck ROM: full    Dental no notable dental hx. (+) Poor Dentition, Missing   Pulmonary shortness of breath, with exertion and at rest, neg sleep apnea, COPD,  COPD inhaler and oxygen dependent, neg recent URI,    Pulmonary exam normal breath sounds clear to auscultation       Cardiovascular Exercise Tolerance: Good hypertension, (-) angina+ CAD and +CHF  (-) Past MI, (-) Cardiac Stents and (-) CABG Normal cardiovascular exam+ dysrhythmias Supra Ventricular Tachycardia + Valvular Problems/Murmurs MR  Rhythm:regular Rate:Normal     Neuro/Psych negative neurological ROS  negative psych ROS   GI/Hepatic GERD  ,(+) Hepatitis -, C  Endo/Other  negative endocrine ROSdiabetes  Renal/GU ESRF and DialysisRenal disease (last dialysis on saturday)negative Renal ROS  negative genitourinary   Musculoskeletal   Abdominal   Peds  Hematology  (+) Blood dyscrasia, anemia ,   Anesthesia Other Findings Past Medical History:   ESRD on hemodialysis ([place])                                   DM2 (diabetes mellitus, type 2) (HCC)                        Hepatitis C                                                  Migraine with aura                                           HOH (hard of hearing)                                        Mitral regurgitation                                           Comment:a. calcified mitral annulus, mod MR by echo in               06/2014   Cognitive impairment                                         CAD (coronary artery disease)                                 HTN (hypertension)  Chronic systolic CHF (congestive heart failure*                Comment:a. EF 45% by echo in 06/2014   Paroxysmal SVT (supraventricular tachycardia) *                Comment:a. history of this, with last known occurrence               in 06/2014   COPD (chronic obstructive pulmonary disease) (* 07/14/2014    GERD (gastroesophageal reflux disease)          07/14/2014    Renal insufficiency                                          Reproductive/Obstetrics negative OB ROS                             Anesthesia Physical Anesthesia Plan  ASA: IV  Anesthesia Plan: General   Post-op Pain Management:    Induction:   Airway Management Planned:   Additional Equipment:   Intra-op Plan:   Post-operative Plan:   Informed Consent: I have reviewed the patients History and Physical, chart, labs and discussed the procedure including the risks, benefits and alternatives for the proposed anesthesia with the patient or authorized representative who has indicated his/her understanding and acceptance.   Dental Advisory Given  Plan Discussed with: Anesthesiologist, CRNA and Surgeon  Anesthesia Plan Comments:         Anesthesia Quick Evaluation

## 2015-04-22 NOTE — Progress Notes (Signed)
TX started 

## 2015-04-22 NOTE — Op Note (Signed)
[facility]astroenterology Patient Name: Kelsey Peterson Procedure Date: 04/22/2015 10:07 AM MRN: [id number] Account #: [id number] Date of Birth: 15-Aug-1955 Admit Type: Inpatient Age: 60 Room: [facility] ENDO ROOM 1 Gender: Female Note Status: Finalized Procedure:            Upper GI endoscopy Indications:          Suspected upper gastrointestinal bleeding Providers:            Manya Silvas, MD Referring MD:         No Local Md, MD (Referring MD) Medicines:            Propofol per Anesthesia Complications:        No immediate complications. Procedure:            Pre-Anesthesia Assessment:                       - After reviewing the risks and benefits, the patient                        was deemed in satisfactory condition to undergo the                        procedure.                       After obtaining informed consent, the endoscope was                        passed under direct vision. Throughout the procedure,                        the patient's blood pressure, pulse, and oxygen                        saturations were monitored continuously. The Endoscope                        was introduced through the mouth, and advanced to the                        second part of duodenum. The upper GI endoscopy was                        accomplished without difficulty. The patient tolerated                        the procedure well. Findings:      The examined esophagus was normal. No varices seen.      Moderate portal hypertensive gastropathy was found in the gastric body.       Antrum OK      Red blood-small amounts like flecks was found in the second portion of       the duodenum. Very small amount. No obvious location for bleeding. No       masses, no ulcers. Impression:           - Normal esophagus.                       - Portal hypertensive gastropathy.                       -  Blood in the second portion of the duodenum.                       - No  specimens collected. Recommendation:       - The findings and recommendations were discussed with                        the patient's primary physician. Manya Silvas, MD 04/22/2015 10:30:47 AM This report has been signed electronically. Number of Addenda: 0 Note Initiated On: 04/22/2015 10:07 AM      [facility]

## 2015-04-22 NOTE — Care Management (Signed)
Patient for EGD today.  Hgb has been stable.  Anticipate discharge within 24 hours if all remains stable

## 2015-04-22 NOTE — Progress Notes (Signed)
Dialysis called to inform primary RN that patient is complaining of abdominal pain downstairs and doesn't want to be hooked up to dialysis until she gets something for pain. Patient has complained of no pain until now and was offered to get medication ordered for stomach cramps and the patient refused yesterday and last night. Paged Dr. Anselm Jungling and MD ordered a one time dose of toradol. Took medication down to dialysis and patient was asleep. Left medication with dialysis nurse in case patient wakes up and wants the pain med.

## 2015-04-22 NOTE — Care Management Important Message (Signed)
Important Message  Patient Details  Name: Kelsey Peterson MRN: [id number] Date of Birth: [birth year]   Medicare Important Message Given:  Yes    Katrina Stack, RN 04/22/2015, 1:43 PM

## 2015-04-22 NOTE — Anesthesia Procedure Notes (Signed)
Date/Time: 04/22/2015 10:10 AM Performed by: Johnna Acosta Pre-anesthesia Checklist: Patient identified, Emergency Drugs available, Suction available, Patient being monitored and Timeout performed Patient Re-evaluated:Patient Re-evaluated prior to inductionOxygen Delivery Method: Nasal cannula

## 2015-04-22 NOTE — Consult Note (Signed)
ENDOCRINOLOGY CONSULTATION  REFERRING PHYSICIAN:  Vaughan Basta, MD CONSULTING PHYSICIAN:  A. Lavone Orn, MD.  CHIEF COMPLAINT:  Hypoglycemia  HISTORY OF PRESENT ILLNESS:  60 y.o. female with ESRD on HD admitted on 3/30 with nausea/vomiting with hematemesis and found to have severe hypoglycemia with initial blood sugar of 24 on venous testing. She was started on IV dextrose which has been continued since admission. She was also admitted two weeks ago, on 04/02/15 for SOB attributed to pulmonary edema and also noted to have an incidental low blood sugar of 46. She does not have a history of known diabetes. While it is listed in her chart, she denies this. She denies use of insulin.   Since admission and while receiving IV dextrose 5%, sugars have been in the 91-130 range. She was started on oral Prednisone yesterday. She is eating a regular diet. Does not seem that she continues to have nausea or vomiting. She did undergo EGD earlier today which did not reveal clear source of her hematemesis. Additionally she underwent CT abdomen today, notable for no pancreatic mass or other acute pathology.  Notable labs have included on 3/31: glucose 104, insulin 6.7, c-peptide 8.2, and cortisol 12.8 (all obtained at 4 AM). Last Hgb A1c in 06/2014 was 5.8%.   She is a difficult historian as she is hard of hearing and this history was obtained by writing notes on a noteboard that she would read, verbalize, and speak answers to. She then fell asleep mid way through this interview.   PAST MEDICAL HISTORY:  Past Medical History  Diagnosis Date  . ESRD on hemodialysis ([place])   . Secondary hyperparathyroidism of renal origin ([place])   . Hepatitis C   . Migraine with aura   . HOH (hard of hearing)   . Mitral regurgitation     a. calcified mitral annulus, mod MR by echo in 06/2014  . Cognitive impairment   . CAD (coronary artery disease)   . HTN (hypertension)   . Chronic systolic CHF (congestive heart  failure) ([facility])     a. EF 45% by echo in 06/2014  . Paroxysmal SVT (supraventricular tachycardia) ([place])     a. history of this, with last known occurrence in 06/2014  . COPD (chronic obstructive pulmonary disease) ([place]) 07/14/2014  . GERD (gastroesophageal reflux disease) 07/14/2014  . Anemia of chronic disease     CURRENT MEDICATIONS:  . amiodarone  200 mg Oral Daily  . amLODipine  5 mg Oral Daily  . butamben-tetracaine-benzocaine      . calcium acetate  1,334 mg Oral TID WC  . cinacalcet  60 mg Oral Q breakfast  . fluticasone furoate-vilanterol  1 puff Inhalation Daily  . ipratropium-albuterol  3 mL Nebulization Once  . isosorbide mononitrate  30 mg Oral Daily  . ketorolac  30 mg Intravenous Once  . metoprolol tartrate  12.5 mg Oral BID  . pantoprazole (PROTONIX) IV  40 mg Intravenous Q12H     SOCIAL HISTORY:  Social History  Substance Use Topics  . Smoking status: Never Smoker   . Smokeless tobacco: Never Used  . Alcohol Use: No     FAMILY HISTORY:   Family History  Problem Relation Age of Onset  . Heart disease Mother   . Hypertension Mother      ALLERGIES:  Allergies  Allergen Reactions  . Contrast Media [Iodinated Diagnostic Agents] Other (See Comments)    Unknown reaction  . Morphine And Related Hives  . Other Rash  Blood Pressure Pill (Pt doesn't remember name)     REVIEW OF SYSTEMS:  Difficult to obtain as she is hard of hearing and drowsy.  PHYSICAL EXAMINATION:  BP 154/98 mmHg  Pulse 68  Temp(Src) 97.6 F (36.4 C) (Oral)  Resp 20  Ht 5\' 2"  (1.575 m)  Wt 83.4 kg (183 lb 13.8 oz)  BMI 33.62 kg/m2  SpO2 100%  GENERAL:  Well-developed AA female in NAD. HEENT:  EOMI.  Oropharynx is clear.  NECK:  Supple.  No thyromegaly.  No neck tenderness.  CARDIAC:  Regular rate and rhythm without murmur.  PULMONARY:  Clear to auscultation bilaterally. No wheeze. ABDOMEN:  Diffusely soft, nontender, nondistended.  EXTREMITIES:  No peripheral edema is  present.  Left arm fistula present. SKIN:  No rash or dermatopathy noted. NEUROLOGIC:   No tremor. +hard of hearing. PSYCHIATRIC:  Alert and oriented.  LABORATORY DATA:  Results for orders placed or performed during the hospital encounter of 04/18/15 (from the past 24 hour(s))  Glucose, capillary     Status: Abnormal   Collection Time: 04/21/15  9:24 PM  Result Value Ref Range   Glucose-Capillary 181 (H) 65 - 99 mg/dL  Glucose, capillary     Status: Abnormal   Collection Time: 04/22/15 12:19 AM  Result Value Ref Range   Glucose-Capillary 147 (H) 65 - 99 mg/dL  Glucose, capillary     Status: Abnormal   Collection Time: 04/22/15  4:44 AM  Result Value Ref Range   Glucose-Capillary 141 (H) 65 - 99 mg/dL  Glucose, capillary     Status: Abnormal   Collection Time: 04/22/15  7:28 AM  Result Value Ref Range   Glucose-Capillary 153 (H) 65 - 99 mg/dL  Glucose, capillary     Status: Abnormal   Collection Time: 04/22/15 12:00 PM  Result Value Ref Range   Glucose-Capillary 117 (H) 65 - 99 mg/dL   Radiology 04/22/15 EXAM:  CT ABDOMEN AND PELVIS WITHOUT AND WITH CONTRAST  TECHNIQUE: Multidetector CT imaging of the abdomen and pelvis was performed following the standard protocol before and following the bolus administration of intravenous contrast.  CONTRAST: 166mL ISOVUE-300 IOPAMIDOL (ISOVUE-300) INJECTION 61%  COMPARISON: 01/18/2014.  FINDINGS: Lower chest: Lung bases show a loculated moderate left pleural effusion. Small right pleural effusion. Added density in the lung bases may be due to expiratory phase imaging. Minimal compressive atelectasis in the left lower lobe. Heart is enlarged. No pericardial effusion.  Hepatobiliary: Liver is decreased in attenuation diffusely. Large stones are seen in the gallbladder. No definite biliary ductal dilatation.  Pancreas: Negative.  Spleen: Negative.  Adrenals/Urinary Tract: Adrenal glands are unremarkable. Kidneys  are atrophic. Low-attenuation lesions in the native kidneys measure up to 13 mm on the right, difficult to characterize due to size. There may be a tiny cystocele. Bladder is otherwise grossly unremarkable.  Stomach/Bowel: Tiny hiatal hernia. Stomach, small bowel, appendix and colon are unremarkable.  Vascular/Lymphatic: Possible esophageal varices. No pathologically enlarged lymph nodes.  Reproductive: Uterus and ovaries are visualized.  Other: Small ascites. Diffuse body wall edema.  Musculoskeletal: Dense osseous structures, indicative of chronic renal disease. No worrisome lytic or sclerotic lesions. Degenerative changes are seen in the spine.  IMPRESSION: 1. No evidence of a pancreatic lesion. 2. Hepatic steatosis. 3. Ascites and bilateral pleural effusions, left greater than right. 4. Cholelithiasis.   Electronically Signed  By: Lorin Picket M.D.  On: 04/22/2015 10:21   ASSESSMENT:  Hypoglycemia ESRD on HD  PLAN: She has a history of  severe recurrent hypoglycemia proven on venous testing, however not clear that she has exhibited typical hypoglycemia symptoms.   Causes of hypoglycemia may include malnutrition, medications, or less likely would be hyperinsulinemic causes (insulinoma, autoimmune, and nesidioblastosis).  Labs notable for elevated c-peptide, however that level is fairly meaningless as she had a normal serum glucose and was receiving IV dextrose at the time. To prove endogeneous hyperinsulinemia, a serum glucose must be low and preferably <50 at the time when the insulin/c-peptide is high.   Will stop her IV dextrose now. All sugars to decrease, if this occurs. Continue to allow regular diet. Leave crackers/juice at bedside for her to self treat if symptomatic. Continue fingerstick BG testing q4 hrs. If a fingerstick BG is <50, then will obtain labs for hypoglycemia including c-peptide, beta hydroxybutyrate, insulin, glucose, and sulfonyluea  screen. Draw labs before letting her eat. Then treat with PO juice/food and can restart IV dextose if hypoglycemia persists with symptoms.   Will follow along with you.  Thank you for the kind request for consultation.

## 2015-04-22 NOTE — Progress Notes (Signed)
MD informed RN that patient would go down to CT scan at 700 this morning. Called CT and they stated a transporter would be here shortly to get the patient. Currently has fluids running at 50 per night shift MD and took her prednisone and benadryl this morning. Patient is feeling nauseous, but states she will not take anymore pills and when I asked her if she wanted anything for the nausea she stated "do whatever you want". The only med available is zofran tablet, which patient does not want. Transporter is here now to get patient.

## 2015-04-22 NOTE — Progress Notes (Signed)
Spoke with Dr. Marney Setting about patient's chest X ray, Dr. Marney Setting stated to reduce fluids to 31ml/hr.

## 2015-04-22 NOTE — Progress Notes (Signed)
Patient's dinner tray arrived and she is eating now. She completed the CT of her abdomen today, had her EGD that was negative for any active bleeding, and also had dialysis today. Vitals are stable. NSR on tele. Patient can most likely discharge tomorrow depending on dialysis. Will continue to monitor.

## 2015-04-22 NOTE — H&P (Signed)
Spoke with Dr. Melynda Ripple about patient recent wheezing throughout lungs. Patient has fluids going at 78ml/hr. New order stop fluids, stat chest X-ray, and neb treatment.

## 2015-04-22 NOTE — OR Nursing (Signed)
PT became very agitated wanted o2 mask of of face. Encouraged to take deep breaths an 02 changed to 4 l [place]. Dr Teryl Lucy aware of pt;s bp but is allowing her to calm down and get her regular meds when she returns to floor

## 2015-04-22 NOTE — OR Nursing (Signed)
DR Vira Agar IN.

## 2015-04-22 NOTE — Progress Notes (Signed)
PRE HD   

## 2015-04-22 NOTE — Transfer of Care (Signed)
Immediate Anesthesia Transfer of Care Note  Patient: Kelsey Peterson  Procedure(s) Performed: Procedure(s): ESOPHAGOGASTRODUODENOSCOPY (EGD) (N/A)  Patient Location: PACU  Anesthesia Type:General  Level of Consciousness: sedated  Airway & Oxygen Therapy: Patient Spontanous Breathing and Patient connected to face mask oxygen  Post-op Assessment: Report given to RN and Post -op Vital signs reviewed and stable  Post vital signs: Reviewed and stable  Last Vitals:  Filed Vitals:   04/22/15 0536 04/22/15 1032  BP: 158/103 189/92  Pulse: 91 77  Temp:  36.3 C  Resp:  27    Complications: No apparent anesthesia complications

## 2015-04-23 ENCOUNTER — Encounter: Payer: Self-pay | Admitting: Unknown Physician Specialty

## 2015-04-23 LAB — GLUCOSE, CAPILLARY
GLUCOSE-CAPILLARY: 117 mg/dL — AB (ref 65–99)
GLUCOSE-CAPILLARY: 93 mg/dL (ref 65–99)
Glucose-Capillary: 112 mg/dL — ABNORMAL HIGH (ref 65–99)
Glucose-Capillary: 117 mg/dL — ABNORMAL HIGH (ref 65–99)
Glucose-Capillary: 127 mg/dL — ABNORMAL HIGH (ref 65–99)

## 2015-04-23 LAB — CORTISOL: CORTISOL PLASMA: 4.8 ug/dL

## 2015-04-23 MED ORDER — PANTOPRAZOLE SODIUM 40 MG PO TBEC
40.0000 mg | DELAYED_RELEASE_TABLET | Freq: Two times a day (BID) | ORAL | Status: DC
  Administered 2015-04-23: 40 mg via ORAL
  Filled 2015-04-23: qty 1

## 2015-04-23 MED ORDER — PANTOPRAZOLE SODIUM 40 MG PO TBEC: 40.0000 mg | DELAYED_RELEASE_TABLET | Freq: Two times a day (BID) | ORAL | Status: AC

## 2015-04-23 MED ORDER — IPRATROPIUM-ALBUTEROL 0.5-2.5 (3) MG/3ML IN SOLN: 3.0000 mL | RESPIRATORY_TRACT | Status: AC | PRN

## 2015-04-23 MED ORDER — EPOETIN ALFA 10000 UNIT/ML IJ SOLN: 4000.0000 [IU] | INTRAMUSCULAR | Status: DC

## 2015-04-23 NOTE — Progress Notes (Signed)
Endocrinology follow up  History of present illness Kelsey Peterson is a 60 y.o. female with ESRD on HD and history of hypoglycemia. She was seen while doing dialysis. No acute events. IV dextrose stopped yesterday. Sugars in last 24 hrs have been in the 93-138 range. No recurrence of hypoglycemia. Of note, when fingerstick BG yesterday even was 77, a venous BG was 101. She is on a regular diet and eating 25-50% of meals.    Medical history Past Medical History  Diagnosis Date  . ESRD on hemodialysis ([place])   . Secondary hyperparathyroidism of renal origin ([place])   . Hepatitis C   . Migraine with aura   . HOH (hard of hearing)   . Mitral regurgitation     a. calcified mitral annulus, mod MR by echo in 06/2014  . Cognitive impairment   . CAD (coronary artery disease)   . HTN (hypertension)   . Chronic systolic CHF (congestive heart failure) ([facility])     a. EF 45% by echo in 06/2014  . Paroxysmal SVT (supraventricular tachycardia) ([place])     a. history of this, with last known occurrence in 06/2014  . COPD (chronic obstructive pulmonary disease) ([place]) 07/14/2014  . GERD (gastroesophageal reflux disease) 07/14/2014  . Anemia of chronic disease      Surgical history Past Surgical History  Procedure Laterality Date  . Dialysis fistula creation    . Tubal ligation    . Colonoscopy with propofol Left 07/09/2014    Procedure: COLONOSCOPY WITH PROPOFOL;  Surgeon: Hulen Luster, MD;  Location[facility] ENDOSCOPY;  Service: Endoscopy;  Laterality: Left;  . Colonoscopy N/A 07/15/2014    Procedure: COLONOSCOPY;  Surgeon: Manya Silvas, MD;  Location: [facility] ENDOSCOPY;  Service: Endoscopy;  Laterality: N/A;  . Esophagogastroduodenoscopy N/A 04/22/2015    Procedure: ESOPHAGOGASTRODUODENOSCOPY (EGD);  Surgeon: Manya Silvas, MD;  Location: [facility] ENDOSCOPY;  Service: Endoscopy;  Laterality: N/A;     Medications . amiodarone  200 mg Oral Daily  . amLODipine  5 mg Oral Daily  . calcium acetate  1,334 mg  Oral TID WC  . cinacalcet  60 mg Oral Q breakfast  . [START ON 04/24/2015] epoetin (EPOGEN/PROCRIT) injection  4,000 Units Intravenous Q T,Th,Sa-HD  . fluticasone furoate-vilanterol  1 puff Inhalation Daily  . ipratropium-albuterol  3 mL Nebulization Once  . isosorbide mononitrate  30 mg Oral Daily  . ketorolac  30 mg Intravenous Once  . metoprolol tartrate  12.5 mg Oral BID  . pantoprazole  40 mg Oral BID AC     Social history Social History  Substance Use Topics  . Smoking status: Never Smoker   . Smokeless tobacco: Never Used  . Alcohol Use: No     Family history Family History  Problem Relation Age of Onset  . Heart disease Mother   . Hypertension Mother      Review of systems CV: no chest pain or palpitations PULM: no cough or shortness of breath ABD: no abdominal pain or nausea or vomiting   Physical Exam BP 132/88 mmHg  Pulse 74  Temp(Src) 97.6 F (36.4 C) (Oral)  Resp 17  Ht 5\' 2"  (1.575 m)  Wt 81.9 kg (180 lb 8.9 oz)  BMI 33.02 kg/m2  SpO2 95%  GEN: well developed, well nourished female, in NAD. HEENT: No proptosis, EOMI, lid lag or stare. Oropharynx is clear.  NECK: supple, trachea midline.   RESPIRATORY: clear bilaterally, no wheeze, good inspiratory effort. CV: No carotid bruits, RRR.  MUSCULOSKELETAL: normal gait and station, no clubbing, no tremor ABD: soft, NT/ND, no organomegaly EXT: no peripheral edema SKIN: no dermatopathy or rash or acanthosis nigricans.  LYMPH: no submandibular or supraclavicular LAD NEURO: PERRL, 2+ knee DTRs equal and symmetric.  PSYC: alert and oriented, good insight  Labs Results for orders placed or performed during the hospital encounter of 04/18/15 (from the past 24 hour(s))  Beta-hydroxybutyric acid     Status: Abnormal   Collection Time: 04/22/15  5:07 PM  Result Value Ref Range   Beta-Hydroxybutyric Acid 0.92 (H) 0.05 - 0.27 mmol/L  Glucose, random     Status: Abnormal   Collection Time: 04/22/15  5:07 PM   Result Value Ref Range   Glucose, Bld 101 (H) 65 - 99 mg/dL  Glucose, capillary     Status: None   Collection Time: 04/22/15  5:43 PM  Result Value Ref Range   Glucose-Capillary 77 65 - 99 mg/dL  Glucose, capillary     Status: Abnormal   Collection Time: 04/22/15  9:03 PM  Result Value Ref Range   Glucose-Capillary 138 (H) 65 - 99 mg/dL  Glucose, capillary     Status: Abnormal   Collection Time: 04/23/15 12:29 AM  Result Value Ref Range   Glucose-Capillary 117 (H) 65 - 99 mg/dL  Glucose, capillary     Status: Abnormal   Collection Time: 04/23/15  4:56 AM  Result Value Ref Range   Glucose-Capillary 127 (H) 65 - 99 mg/dL  Glucose, capillary     Status: Abnormal   Collection Time: 04/23/15  7:33 AM  Result Value Ref Range   Glucose-Capillary 117 (H) 65 - 99 mg/dL  Glucose, capillary     Status: Abnormal   Collection Time: 04/23/15 11:46 AM  Result Value Ref Range   Glucose-Capillary 112 (H) 65 - 99 mg/dL  Glucose, capillary     Status: None   Collection Time: 04/23/15  4:16 PM  Result Value Ref Range   Glucose-Capillary 93 65 - 99 mg/dL     Assessment Hypoglycemia ESRD on HD  Plan -No recurrence of hypoglycemia. -Continue supportive care. -Will leave standing labs in place, just in case hypoglycemia recurs.  -In future, if she re-presents with hypoglycemia, perhaps these labs can be drawn or can be added to labs drawn at presentation to evaluate further. If c-peptide/insulin levels low when glucose is low, this would suggest malnutrition as cause of hypoglycemia.   Will follow along and re-evaluate when/if she has hypoglycemia during this hospitalization. Please call if concerns.

## 2015-04-23 NOTE — Discharge Summary (Signed)
[facility] NAME: Kelsey Peterson    MR#:  [id number]  DATE OF BIRTH:  July 04, 1955  DATE OF ADMISSION:  04/18/2015 ADMITTING PHYSICIAN: Henreitta Leber, MD  DATE OF DISCHARGE: 04/23/2015  PRIMARY CARE PHYSICIAN: No primary care provider on file.    ADMISSION DIAGNOSIS:  Acute hyperkalemia [E87.5] Hypoglycemia [E16.2] Upper GI bleed [K92.2]  DISCHARGE DIAGNOSIS:  Principal Problem:   Hypoglycemia Active Problems:   Hyperkalemia   Upper GI bleed   Elevated troponin   ESRD on hemodialysis (HCC)   Volume depletion, gastrointestinal loss   Uncontrolled hypertension   SECONDARY DIAGNOSIS:   Past Medical History  Diagnosis Date  . ESRD on hemodialysis ([place])   . Secondary hyperparathyroidism of renal origin ([place])   . Hepatitis C   . Migraine with aura   . HOH (hard of hearing)   . Mitral regurgitation     a. calcified mitral annulus, mod MR by echo in 06/2014  . Cognitive impairment   . CAD (coronary artery disease)   . HTN (hypertension)   . Chronic systolic CHF (congestive heart failure) (HCC)     a. EF 45% by echo in 06/2014  . Paroxysmal SVT (supraventricular tachycardia) ([place])     a. history of this, with last known occurrence in 06/2014  . COPD (chronic obstructive pulmonary disease) ([place]) 07/14/2014  . GERD (gastroesophageal reflux disease) 07/14/2014  . Anemia of chronic disease     HOSPITAL COURSE:   1). Severe hypoglycemia - Patient presented with severe hypoglycemia with initial glucose of 11 and subsequent rechecks were 13 and 24. Glucose was currently stable on dextrose drip- stopped and still normal blood sugar. - Patient previously presented to the ED about 2 weeks ago with severe hypoglycemia. Etiology is unclear.  - She denies Hx of Diabetes, HBA1c is normal. Checked insulin and C peptide- higher. - Blood sugar stable on D5 drip 30 ml/hr- stopped dextrose drip, and monitored without it. - C  peptide is high- negative CT abdomen and pelvis with contrast to look for insulinoma  - called endocrine consult. Blood sugar now stable. As per endocrinologist- C peptide was collected when she was on Dextrose drip and blood sugar was high- so it has no value.  - likely Hypoglycemia was due to not eating well due to gastropathy.  2). Hypokalemia - Patient's potassium is 5.9. EKG does not show any peaked T waves. - Patient given 15 g of Kayexalate.  - Had HD - stable now.  3). Possible upper GI bleed - Patient has been having coffee-ground emesis all day yesterday - Patient's hemoglobin was 8.5. - Later dropped to 7.0 , consult GI - suggested no endoscopy. - Patient is on liquids orally now as no more vomoting.- tolerated soft diet also- will give regular food now. - Continue pantoprazole and Zofran. - pt insisted to know the reason of bleed, so GI agreed to have EGD - showed Portal hypertensive gastropathy. - had some abdominal pain after EGD in HD - given toradol- resolved. - will give PPI BID on discharge and follow in GI clinic.  4). ESRD on hemodialysis - Patient is on hemodialysis on Tuesday Thursday Saturday . She missed her dialysis due to significant nausea and vomiting. - Nephrology has been consulted . - continued routine HD  5). Volume depletion - Patient is significantly volume depleted clinically likely due to intractable vomiting. - Gentle IV fluid bolus given due to patient's CHF and  ESRD. - Monitor fluid status closely - Held Lasix - now appears well hydrated.  6). Elevated troponin - Troponin is elevated at 0.13. This is likely due to end-stage renal disease. We will however check troponin one more time to rule out ACS. Patient doesn't have chest pain. - Continue metoprolol  7). Uncontrolled hypertension - Continue home blood pressure medications and add IV hydralazine when necessary for elevated blood pressure.   DISCHARGE CONDITIONS:    Stable.  CONSULTS OBTAINED:  Treatment Team:  Lavonia Dana, MD Manya Silvas, MD Abby Percell Locus, MD  DRUG ALLERGIES:   Allergies  Allergen Reactions  . Contrast Media [Iodinated Diagnostic Agents] Other (See Comments)    Unknown reaction  . Morphine And Related Hives  . Other Rash    Blood Pressure Pill (Pt doesn't remember name)     DISCHARGE MEDICATIONS:   Current Discharge Medication List    START taking these medications   Details  !! ipratropium-albuterol (DUONEB) 0.5-2.5 (3) MG/3ML SOLN Take 3 mLs by nebulization every 4 (four) hours as needed. Qty: 360 mL, Refills: 0    pantoprazole (PROTONIX) 40 MG tablet Take 1 tablet (40 mg total) by mouth 2 (two) times daily. Qty: 60 tablet, Refills: 0     !! - Potential duplicate medications found. Please discuss with provider.    CONTINUE these medications which have NOT CHANGED   Details  albuterol (PROVENTIL HFA;VENTOLIN HFA) 108 (90 Base) MCG/ACT inhaler Inhale 2 puffs into the lungs every 6 (six) hours as needed for wheezing or shortness of breath. Qty: 1 Inhaler, Refills: 2    amiodarone (PACERONE) 200 MG tablet Take 1 tablet (200 mg total) by mouth daily.    amLODipine (NORVASC) 5 MG tablet Take 5 mg by mouth daily.    calcium acetate (PHOSLO) 667 MG capsule Take 2 capsules by mouth 3 (three) times daily with meals. And one capsule with snakes    cinacalcet (SENSIPAR) 60 MG tablet Take 60 mg by mouth daily.    clopidogrel (PLAVIX) 75 MG tablet Take 75 mg by mouth daily.    Fluticasone-Salmeterol (ADVAIR DISKUS) 250-50 MCG/DOSE AEPB Inhale 1 puff into the lungs 2 (two) times daily. Qty: 60 each, Refills: 0    folic acid-vitamin b complex-vitamin c-selenium-zinc (DIALYVITE) 3 MG TABS tablet Take 1 tablet by mouth daily.    furosemide (LASIX) 80 MG tablet Take 80 mg by mouth daily. Patient takes on non dialysis days.    !! ipratropium-albuterol (DUONEB) 0.5-2.5 (3) MG/3ML SOLN Take 3 mLs by  nebulization 4 (four) times daily as needed. DX: COPD DX Code: J44.9 Qty: 360 mL, Refills: 11    isosorbide mononitrate (IMDUR) 30 MG 24 hr tablet Take 30 mg by mouth daily.    metoprolol tartrate (LOPRESSOR) 25 MG tablet Take 12.5 mg by mouth 2 (two) times daily.    ondansetron (ZOFRAN) 4 MG tablet Take 4 mg by mouth every 8 (eight) hours as needed for nausea or vomiting. Reported on 02/05/2015     !! - Potential duplicate medications found. Please discuss with provider.    STOP taking these medications     esomeprazole (NEXIUM) 40 MG capsule          DISCHARGE INSTRUCTIONS:    Follow with Gi and Endocrine clinic.  If you experience worsening of your admission symptoms, develop shortness of breath, life threatening emergency, suicidal or homicidal thoughts you must seek medical attention immediately by calling 911 or calling your MD immediately  if symptoms  less severe.  You Must read complete instructions/literature along with all the possible adverse reactions/side effects for all the Medicines you take and that have been prescribed to you. Take any new Medicines after you have completely understood and accept all the possible adverse reactions/side effects.   Please note  You were cared for by a hospitalist during your hospital stay. If you have any questions about your discharge medications or the care you received while you were in the hospital after you are discharged, you can call the unit and asked to speak with the hospitalist on call if the hospitalist that took care of you is not available. Once you are discharged, your primary care physician will handle any further medical issues. Please note that NO REFILLS for any discharge medications will be authorized once you are discharged, as it is imperative that you return to your primary care physician (or establish a relationship with a primary care physician if you do not have one) for your aftercare needs so that they can  reassess your need for medications and monitor your lab values.    Today   CHIEF COMPLAINT:   Chief Complaint  Patient presents with  . Hematemesis    HISTORY OF PRESENT ILLNESS:  Kelsey Peterson  is a 60 y.o. female with a known history of ESRD on hemodialysis, hepatitis C, cognitive impairment, hearing loss, coronary artery disease, hypertension, CHF, PSVT and COPD. She presents to the ED today due to complaining of significant nausea and vomiting. Patient states that her nausea started last night and she started vomiting early today. She is a poor historian due to her hearing loss and is unable to tell me a full history but she states that she has vomited many times today (she cannot put a number). Vomitus contained of recently ingested food and coffee ground emesis. She also states that she had some blood in her vomitus at a point in time. She has been unable to eat for the last 3 days but she denies any fever, abdominal pain, recent travel, consumption of unusual food or any changes in bowel habits. She also denies any changes in urine habits. She was unable to undergo her hemodialysis today due to her illness. While being chest noted to the ED, EMS noted that her glucose level was 11. She was given IM glucagon and on arrival in the ED, glucose was 12. Patient was then given D50. She denies any chest pain but has had some mild shortness of breath which is chronic. She is not on any blood thinners and denies use of over-the-counter medications. There has been no recent change in her medications. She states that she is not diabetic.  Of note, patient was admitted about 2 weeks ago to this facility and was managed for acute on chronic respiratory failure with hypoxia as well as significant hypoglycemia and hyperkalemia. A1c was normal at that time and etiology of hypoglycemia was not found.  In the ED today, hemoglobin was 8.5 with a normal platelet counts and white count. CMP was sig for a  calcium of 5.9, BUN of 58, creatinine of 7.27 and glucose of 24. Troponin was elevated at 0.13 and INR was 1.7. EKG showed first-degree AV block with no peaked T waves and chest x-ray was only significant for cardiomegaly and increased vascular congestion/CHF. Patient was given D50 and was type and screen in the ED. Obtaining history from patient, patient continued to vomit greenish/dark brown fluids which appears to be copious.  VITAL SIGNS:  Blood pressure 129/82, pulse 76, temperature 98.5 F (36.9 C), temperature source Oral, resp. rate 18, height 5\' 2"  (1.575 m), weight 83.4 kg (183 lb 13.8 oz), SpO2 95 %.  I/O:   Intake/Output Summary (Last 24 hours) at 04/23/15 1054 Last data filed at 04/23/15 1001  Gross per 24 hour  Intake    120 ml  Output   3000 ml  Net  -2880 ml    PHYSICAL EXAMINATION:  GENERAL:  60 y.o.-year-old patient lying in the bed with no acute distress.  EYES: Pupils equal, round, reactive to light and accommodation. No scleral icterus. Extraocular muscles intact.  HEENT: Head atraumatic, normocephalic. Oropharynx and nasopharynx clear.  NECK:  Supple, no jugular venous distention. No thyroid enlargement, no tenderness.  LUNGS: Normal breath sounds bilaterally, no wheezing, rales,rhonchi or crepitation. No use of accessory muscles of respiration.  CARDIOVASCULAR: S1, S2 normal. No murmurs, rubs, or gallops.  ABDOMEN: Soft, non-tender, non-distended. Bowel sounds present. No organomegaly or mass.  EXTREMITIES: No pedal edema, cyanosis, or clubbing.  NEUROLOGIC: Cranial nerves II through XII are intact. Muscle strength 5/5 in all extremities. Sensation intact. Gait not checked.  PSYCHIATRIC: The patient is alert and oriented x 3.  SKIN: No obvious rash, lesion, or ulcer.   DATA REVIEW:   CBC  Recent Labs Lab 04/21/15 0537  WBC 5.1  HGB 8.0*  HCT 24.4*  PLT 249    Chemistries   Recent Labs Lab 04/18/15 1856 04/19/15 0401 04/20/15 0958 04/22/15 1707   NA 138 137 134*  --   K 5.9* 5.2* 4.2  --   CL 97* 99* 96*  --   CO2 16* 22 29  --   GLUCOSE 24* 104* 93 101*  BUN 58* 65* 33*  --   CREATININE 7.27* 7.91* 5.56*  --   CALCIUM 9.7 8.8* 8.6*  --   MG  --  2.1  --   --   AST 66*  --   --   --   ALT 48  --   --   --   ALKPHOS 133*  --   --   --   BILITOT 3.1*  --   --   --     Cardiac Enzymes  Recent Labs Lab 04/18/15 2138  TROPONINI 0.15*    Microbiology Results  Results for orders placed or performed during the hospital encounter of 04/01/15  MRSA PCR Screening     Status: None   Collection Time: 04/02/15  3:32 AM  Result Value Ref Range Status   MRSA by PCR NEGATIVE NEGATIVE Final    Comment:        The GeneXpert MRSA Assay (FDA approved for NASAL specimens only), is one component of a comprehensive MRSA colonization surveillance program. It is not intended to diagnose MRSA infection nor to guide or monitor treatment for MRSA infections.     RADIOLOGY:  Ct Abdomen Pelvis W Wo Contrast  04/22/2015  CLINICAL DATA:  Generalized abdominal pain and vomiting, coffee ground emesis. EXAM: CT ABDOMEN AND PELVIS WITHOUT AND WITH CONTRAST TECHNIQUE: Multidetector CT imaging of the abdomen and pelvis was performed following the standard protocol before and following the bolus administration of intravenous contrast. CONTRAST:  150mL ISOVUE-300 IOPAMIDOL (ISOVUE-300) INJECTION 61% COMPARISON:  01/18/2014. FINDINGS: Lower chest: Lung bases show a loculated moderate left pleural effusion. Small right pleural effusion. Added density in the lung bases may be due to expiratory phase imaging. Minimal compressive atelectasis in the left  lower lobe. Heart is enlarged. No pericardial effusion. Hepatobiliary: Liver is decreased in attenuation diffusely. Large stones are seen in the gallbladder. No definite biliary ductal dilatation. Pancreas: Negative. Spleen: Negative. Adrenals/Urinary Tract: Adrenal glands are unremarkable. Kidneys are  atrophic. Low-attenuation lesions in the native kidneys measure up to 13 mm on the right, difficult to characterize due to size. There may be a tiny cystocele. Bladder is otherwise grossly unremarkable. Stomach/Bowel: Tiny hiatal hernia. Stomach, small bowel, appendix and colon are unremarkable. Vascular/Lymphatic: Possible esophageal varices. No pathologically enlarged lymph nodes. Reproductive: Uterus and ovaries are visualized. Other: Small ascites.  Diffuse body wall edema. Musculoskeletal: Dense osseous structures, indicative of chronic renal disease. No worrisome lytic or sclerotic lesions. Degenerative changes are seen in the spine. IMPRESSION: 1. No evidence of a pancreatic lesion. 2. Hepatic steatosis. 3. Ascites and bilateral pleural effusions, left greater than right. 4. Cholelithiasis. Electronically Signed   By: Lorin Picket M.D.   On: 04/22/2015 10:21   Dg Chest Port 1 View  04/22/2015  CLINICAL DATA:  Shortness of Breath EXAM: PORTABLE CHEST 1 VIEW COMPARISON:  04/18/2015 FINDINGS: Cardiac shadow remains enlarged. Persistent left pleural effusion and likely underlying infiltrate is present. Vascular congestion is seen. No acute bony abnormality is noted. IMPRESSION: Vascular congestion consistent with CHF. Persistent left basilar changes are noted. Electronically Signed   By: Inez Catalina M.D.   On: 04/22/2015 01:17     Management plans discussed with the patient, family and they are in agreement.  CODE STATUS:     Code Status Orders        Start     Ordered   04/18/15 2334  Full code   Continuous     04/18/15 2333    Code Status History    Date Active Date Inactive Code Status Order ID Comments User Context   04/02/2015  3:21 AM 04/04/2015  8:25 PM Full Code [id number]  Lance Coon, MD Inpatient   02/05/2015  9:17 AM 02/08/2015  8:00 PM Full Code [id number]  Fritzi Mandes, MD Inpatient   12/01/2014  3:08 PM 12/07/2014  7:15 PM Full Code [id number]  Demetrios Loll, MD Inpatient    09/01/2014  5:57 PM 09/06/2014  2:09 PM Full Code [id number]  Demetrios Loll, MD ED   07/14/2014  5:17 AM 07/16/2014  6:40 PM Full Code [id number]  Lance Coon, MD Inpatient   07/03/2014  2:06 PM 07/10/2014  7:11 PM Full Code [id number]  Demetrios Loll, MD Inpatient   06/21/2014  1:35 PM 06/22/2014  4:33 PM Full Code [id number]  Max Sane, MD Inpatient      TOTAL TIME TAKING CARE OF THIS PATIENT: 35 minutes.    Vaughan Basta M.D on 04/23/2015 at 10:54 AM  Between 7am to 6pm - Pager - [phone]  After 6pm go to [email or web address] - password [email or web address]  Office  [phone]  CC: Primary care physician; No primary care provider on file.   Note: This dictation was prepared with Dragon dictation along with smaller phrase technology. Any transcriptional errors that result from this process are unintentional.

## 2015-04-23 NOTE — Progress Notes (Signed)
TX ended.    

## 2015-04-23 NOTE — Progress Notes (Signed)
PRE HD   

## 2015-04-23 NOTE — Care Management (Signed)
After much much encouragement, patient with recommendation of her son Farnes, patient is agreeable to home health nurse.  She is current with PCP at [facility]  She has dialysis T TH Sat from 5A- 10a.  She takes a nap after she gets home from dialysis.  Patient would prefer afternoon home health visits.  She has a hearing disability - reads lips well if speak slowly.  She asks for [facility] to text her rather than call because she does not always answer the phone.  Confirmed her address and phone number.  Son Sanylah Vacchiano asks to be called if there are any concerns regarding hone visits- [phone].  REferral to [facility] and will make initial visit 4/5.

## 2015-04-23 NOTE — Progress Notes (Signed)
[facility] [place] NAME: Kelsey Peterson    MR#:  [id number]  DATE OF BIRTH:  [birth year]  SUBJECTIVE:  CHIEF COMPLAINT:   Chief Complaint  Patient presents with  . Hematemesis   No more vomits, had some dark stool, Hb stable after teransfusion, tolerated regular food, have some abdominal pain, no relation with food.  Endoscopy did show some portal hypertensive gastropathy, blood sugar stable.   REVIEW OF SYSTEMS:  CONSTITUTIONAL: No fever, fatigue or weakness.  EYES: No blurred or double vision.  EARS, NOSE, AND THROAT: No tinnitus or ear pain.  RESPIRATORY: No cough, shortness of breath, wheezing or hemoptysis.  CARDIOVASCULAR: No chest pain, orthopnea, edema.  GASTROINTESTINAL: positive nausea, vomiting,no diarrhea or abdominal pain. Dark vomit. GENITOURINARY: No dysuria, hematuria.  ENDOCRINE: No polyuria, nocturia,  HEMATOLOGY: No anemia, easy bruising or bleeding SKIN: No rash or lesion. MUSCULOSKELETAL: No joint pain or arthritis.   NEUROLOGIC: No tingling, numbness, weakness.  PSYCHIATRY: No anxiety or depression.   ROS  DRUG ALLERGIES:   Allergies  Allergen Reactions  . Contrast Media [Iodinated Diagnostic Agents] Other (See Comments)    Unknown reaction  . Morphine And Related Hives  . Other Rash    Blood Pressure Pill (Pt doesn't remember name)     VITALS:  Blood pressure 129/82, pulse 76, temperature 98.5 F (36.9 C), temperature source Oral, resp. rate 18, height 5\' 2"  (1.575 m), weight 83.4 kg (183 lb 13.8 oz), SpO2 95 %.  PHYSICAL EXAMINATION:  GENERAL:  60 y.o.-year-old patient lying in the bed with no acute distress.  EYES: Pupils equal, round, reactive to light and accommodation. No scleral icterus. Extraocular muscles intact.  HEENT: Head atraumatic, normocephalic. Oropharynx and nasopharynx clear.  NECK:  Supple, no jugular venous distention. No thyroid enlargement, no tenderness.  LUNGS:  Normal breath sounds bilaterally, no wheezing, rales,rhonchi or crepitation. No use of accessory muscles of respiration.  CARDIOVASCULAR: S1, S2 normal. No murmurs, rubs, or gallops.  ABDOMEN: Soft, nontender, nondistended. Bowel sounds present. No organomegaly or mass.  EXTREMITIES: No pedal edema, cyanosis, or clubbing.  NEUROLOGIC: Cranial nerves II through XII are intact. Muscle strength 5/5 in all extremities. Sensation intact. Gait not checked.  PSYCHIATRIC: The patient is alert and oriented x 3.  SKIN: No obvious rash, lesion, or ulcer.   Physical Exam LABORATORY PANEL:   CBC  Recent Labs Lab 04/21/15 0537  WBC 5.1  HGB 8.0*  HCT 24.4*  PLT 249   ------------------------------------------------------------------------------------------------------------------  Chemistries   Recent Labs Lab 04/18/15 1856 04/19/15 0401 04/20/15 0958 04/22/15 1707  NA 138 137 134*  --   K 5.9* 5.2* 4.2  --   CL 97* 99* 96*  --   CO2 16* 22 29  --   GLUCOSE 24* 104* 93 101*  BUN 58* 65* 33*  --   CREATININE 7.27* 7.91* 5.56*  --   CALCIUM 9.7 8.8* 8.6*  --   MG  --  2.1  --   --   AST 66*  --   --   --   ALT 48  --   --   --   ALKPHOS 133*  --   --   --   BILITOT 3.1*  --   --   --    ------------------------------------------------------------------------------------------------------------------  Cardiac Enzymes  Recent Labs Lab 04/18/15 1856 04/18/15 2138  TROPONINI 0.13* 0.15*   ------------------------------------------------------------------------------------------------------------------  RADIOLOGY:  Ct Abdomen Pelvis W Wo Contrast  04/22/2015  CLINICAL DATA:  Generalized abdominal pain and vomiting, coffee ground emesis. EXAM: CT ABDOMEN AND PELVIS WITHOUT AND WITH CONTRAST TECHNIQUE: Multidetector CT imaging of the abdomen and pelvis was performed following the standard protocol before and following the bolus administration of intravenous contrast. CONTRAST:   131mL ISOVUE-300 IOPAMIDOL (ISOVUE-300) INJECTION 61% COMPARISON:  01/18/2014. FINDINGS: Lower chest: Lung bases show a loculated moderate left pleural effusion. Small right pleural effusion. Added density in the lung bases may be due to expiratory phase imaging. Minimal compressive atelectasis in the left lower lobe. Heart is enlarged. No pericardial effusion. Hepatobiliary: Liver is decreased in attenuation diffusely. Large stones are seen in the gallbladder. No definite biliary ductal dilatation. Pancreas: Negative. Spleen: Negative. Adrenals/Urinary Tract: Adrenal glands are unremarkable. Kidneys are atrophic. Low-attenuation lesions in the native kidneys measure up to 13 mm on the right, difficult to characterize due to size. There may be a tiny cystocele. Bladder is otherwise grossly unremarkable. Stomach/Bowel: Tiny hiatal hernia. Stomach, small bowel, appendix and colon are unremarkable. Vascular/Lymphatic: Possible esophageal varices. No pathologically enlarged lymph nodes. Reproductive: Uterus and ovaries are visualized. Other: Small ascites.  Diffuse body wall edema. Musculoskeletal: Dense osseous structures, indicative of chronic renal disease. No worrisome lytic or sclerotic lesions. Degenerative changes are seen in the spine. IMPRESSION: 1. No evidence of a pancreatic lesion. 2. Hepatic steatosis. 3. Ascites and bilateral pleural effusions, left greater than right. 4. Cholelithiasis. Electronically Signed   By: Lorin Picket M.D.   On: 04/22/2015 10:21   Dg Chest Port 1 View  04/22/2015  CLINICAL DATA:  Shortness of Breath EXAM: PORTABLE CHEST 1 VIEW COMPARISON:  04/18/2015 FINDINGS: Cardiac shadow remains enlarged. Persistent left pleural effusion and likely underlying infiltrate is present. Vascular congestion is seen. No acute bony abnormality is noted. IMPRESSION: Vascular congestion consistent with CHF. Persistent left basilar changes are noted. Electronically Signed   By: Inez Catalina M.D.    On: 04/22/2015 01:17    ASSESSMENT AND PLAN:   Principal Problem:   Hypoglycemia Active Problems:   Hyperkalemia   Upper GI bleed   Elevated troponin   ESRD on hemodialysis (HCC)   Volume depletion, gastrointestinal loss   Uncontrolled hypertension  1). Severe hypoglycemia - Patient presented with severe hypoglycemia with initial glucose of 11 and subsequent rechecks were 13 and 24. Glucose was currently stable on dextrose drip- stopped and still normal blood sugar. - Patient previously presented to the ED about 2 weeks ago with severe hypoglycemia. Etiology is unclear.  - She denies Hx of Diabetes, HBA1c is normal. Checked insulin and C peptide- higher. - Blood sugar stable on D5 drip 30 ml/hr- stopped dextrose drip, and monitor without it. - C peptide is high- negative CT abdomen and pelvis with contrast to look for insulinoma  - called endocrine consult. Blood sugar now stable.  2). Hypokalemia - Patient's potassium is 5.9. EKG does not show any peaked T waves. - Patient given 15 g of Kayexalate.  - Had HD - stable now.  3). Possible upper GI bleed - Patient has been having coffee-ground emesis all day yesterday - Patient's hemoglobin was 8.5. -  Later dropped to 7.0 , consult GI - suggested no endoscopy. - Patient is on liquids orally now as no more vomoting.- tolerated soft diet also- will give regular food now. - Continue pantoprazole and Zofran. - pt insisted to know the reason of bleed, so GI agreed to have EGD - showed Portal hypertensive gastropathy. - had some abdominal pain  after EGD in HD today.  4). ESRD on hemodialysis - Patient is on hemodialysis on Tuesday Thursday Saturday . She missed her dialysis  due to significant nausea and vomiting. - Nephrology has been consulted . - continued routine HD  5). Volume depletion - Patient is significantly volume depleted clinically likely due to intractable vomiting. - Gentle IV fluid bolus given due to  patient's CHF and ESRD. - Monitor fluid status closely - Held Lasix - now appears well hydrated.  6). Elevated troponin - Troponin is elevated at 0.13. This is likely due to end-stage renal disease. We will however check troponin one more time to rule out ACS. Patient doesn't have chest pain. - Continue metoprolol  7). Uncontrolled hypertension - Continue home blood pressure medications and add IV hydralazine when necessary for elevated blood pressure.    All the records are reviewed and case discussed with Care Management/Social Workerr. Management plans discussed with the patient, family and they are in agreement.  CODE STATUS: Full  TOTAL TIME TAKING CARE OF THIS PATIENT: 35 minutes.    POSSIBLE D/C IN 2-3 DAYS, DEPENDING ON CLINICAL CONDITION.   Vaughan Basta M.D on 04/23/2015   Between 7am to 6pm - Pager - [phone]  After 6pm go to [email or web address] - password [email or web address] [facility]  [facility]  Office  [phone]  CC: Primary care physician; No primary care provider on file.  Note: This dictation was prepared with Dragon dictation along with smaller phrase technology. Any transcriptional errors that result from this process are unintentional.

## 2015-04-23 NOTE — Progress Notes (Signed)
Patient is discharge home with home health nurse in a stable condition , summary and f/u care given  To both pt's and son, verbalized understanding , left with son

## 2015-04-23 NOTE — Care Management (Signed)
Patient for discharge home and will not allow cm to discuss some discharge services.  If CM is present at the time son comes to pick up patient, will discuss with him but ultimately patient will have to allow the service.  Suggested home health nurse in hopes of decreasing readmissions.

## 2015-04-23 NOTE — Progress Notes (Signed)
Tx started 

## 2015-04-23 NOTE — Progress Notes (Signed)
[facility]  ROUNDING NOTE   Subjective:   3 L removed with dialysis yesterday Breathing better today Slept Comfortably last night   Objective:  Vital signs in last 24 hours:  Temp:  [97.6 F (36.4 C)-98.5 F (36.9 C)] 97.6 F (36.4 C) (04/04 1143) Pulse Rate:  [66-78] 74 (04/04 1143) Resp:  [16-35] 18 (04/04 1143) BP: (129-161)/(79-103) 138/91 mmHg (04/04 1143) SpO2:  [90 %-100 %] 90 % (04/04 1143) Weight:  [83.4 kg (183 lb 13.8 oz)-86.9 kg (191 lb 9.3 oz)] 83.4 kg (183 lb 13.8 oz) (04/03 1700)  Weight change:  Filed Weights   04/20/15 0945 04/22/15 1340 04/22/15 1700  Weight: 86.9 kg (191 lb 9.3 oz) 86.9 kg (191 lb 9.3 oz) 83.4 kg (183 lb 13.8 oz)    Intake/Output: I/O last 3 completed shifts: In: 550 [I.V.:550] Out: 3000 [Other:3000]   Intake/Output this shift:  Total I/O In: 120 [P.O.:120] Out: -   Physical Exam: General: NAD, sitting up on side of the bed  Head: Normocephalic, atraumatic. Moist oral mucosal membranes, hard of hearing  Eyes: Anicteric,  Neck: Supple, trachea midline  Lungs:  Diffuse basilar crackles b/l  Heart: regular  Abdomen:  Soft, nontender,   Extremities: no peripheral edema.  Neurologic: Nonfocal, moving all four extremities  Skin: No lesions  Access: Right arm AVG    Basic Metabolic Panel:  Recent Labs Lab 04/18/15 1856 04/19/15 0401 04/20/15 0958 04/22/15 1707  NA 138 137 134*  --   K 5.9* 5.2* 4.2  --   CL 97* 99* 96*  --   CO2 16* 22 29  --   GLUCOSE 24* 104* 93 101*  BUN 58* 65* 33*  --   CREATININE 7.27* 7.91* 5.56*  --   CALCIUM 9.7 8.8* 8.6*  --   MG  --  2.1  --   --   PHOS  --   --  4.9*  --     Liver Function Tests:  Recent Labs Lab 04/18/15 1856 04/20/15 0958  AST 66*  --   ALT 48  --   ALKPHOS 133*  --   BILITOT 3.1*  --   PROT 8.0  --   ALBUMIN 3.9 3.3*   No results for input(s): LIPASE, AMYLASE in the last 168 hours. No results for input(s): AMMONIA in the last 168  hours.  CBC:  Recent Labs Lab 04/18/15 1856 04/19/15 0401 04/19/15 0918 04/20/15 0958 04/21/15 0537  WBC 8.0 7.7  --  5.3 5.1  HGB 8.5* 7.2* 7.0* 7.8* 8.0*  HCT 26.0* 21.9* 21.0* 23.8* 24.4*  MCV 78.4* 77.3*  --  78.8* 79.8*  PLT 327 277  --  257 249    Cardiac Enzymes:  Recent Labs Lab 04/18/15 1856 04/18/15 2138  TROPONINI 0.13* 0.15*    BNP: Invalid input(s): POCBNP  CBG:  Recent Labs Lab 04/22/15 2103 04/23/15 0029 04/23/15 0456 04/23/15 0733 04/23/15 1146  GLUCAP 138* 117* 127* 117* 112*    Microbiology: Results for orders placed or performed during the hospital encounter of 04/01/15  MRSA PCR Screening     Status: None   Collection Time: 04/02/15  3:32 AM  Result Value Ref Range Status   MRSA by PCR NEGATIVE NEGATIVE Final    Comment:        The GeneXpert MRSA Assay (FDA approved for NASAL specimens only), is one component of a comprehensive MRSA colonization surveillance program. It is not intended to diagnose MRSA infection nor to guide  or monitor treatment for MRSA infections.     Coagulation Studies: No results for input(s): LABPROT, INR in the last 72 hours.  Urinalysis: No results for input(s): COLORURINE, LABSPEC, PHURINE, GLUCOSEU, HGBUR, BILIRUBINUR, KETONESUR, PROTEINUR, UROBILINOGEN, NITRITE, LEUKOCYTESUR in the last 72 hours.  Invalid input(s): APPERANCEUR    Imaging: Ct Abdomen Pelvis W Wo Contrast  04/22/2015  CLINICAL DATA:  Generalized abdominal pain and vomiting, coffee ground emesis. EXAM: CT ABDOMEN AND PELVIS WITHOUT AND WITH CONTRAST TECHNIQUE: Multidetector CT imaging of the abdomen and pelvis was performed following the standard protocol before and following the bolus administration of intravenous contrast. CONTRAST:  170mL ISOVUE-300 IOPAMIDOL (ISOVUE-300) INJECTION 61% COMPARISON:  01/18/2014. FINDINGS: Lower chest: Lung bases show a loculated moderate left pleural effusion. Small right pleural effusion. Added  density in the lung bases may be due to expiratory phase imaging. Minimal compressive atelectasis in the left lower lobe. Heart is enlarged. No pericardial effusion. Hepatobiliary: Liver is decreased in attenuation diffusely. Large stones are seen in the gallbladder. No definite biliary ductal dilatation. Pancreas: Negative. Spleen: Negative. Adrenals/Urinary Tract: Adrenal glands are unremarkable. Kidneys are atrophic. Low-attenuation lesions in the native kidneys measure up to 13 mm on the right, difficult to characterize due to size. There may be a tiny cystocele. Bladder is otherwise grossly unremarkable. Stomach/Bowel: Tiny hiatal hernia. Stomach, small bowel, appendix and colon are unremarkable. Vascular/Lymphatic: Possible esophageal varices. No pathologically enlarged lymph nodes. Reproductive: Uterus and ovaries are visualized. Other: Small ascites.  Diffuse body wall edema. Musculoskeletal: Dense osseous structures, indicative of chronic renal disease. No worrisome lytic or sclerotic lesions. Degenerative changes are seen in the spine. IMPRESSION: 1. No evidence of a pancreatic lesion. 2. Hepatic steatosis. 3. Ascites and bilateral pleural effusions, left greater than right. 4. Cholelithiasis. Electronically Signed   By: Lorin Picket M.D.   On: 04/22/2015 10:21   Dg Chest Port 1 View  04/22/2015  CLINICAL DATA:  Shortness of Breath EXAM: PORTABLE CHEST 1 VIEW COMPARISON:  04/18/2015 FINDINGS: Cardiac shadow remains enlarged. Persistent left pleural effusion and likely underlying infiltrate is present. Vascular congestion is seen. No acute bony abnormality is noted. IMPRESSION: Vascular congestion consistent with CHF. Persistent left basilar changes are noted. Electronically Signed   By: Inez Catalina M.D.   On: 04/22/2015 01:17     Medications:     . amiodarone  200 mg Oral Daily  . amLODipine  5 mg Oral Daily  . calcium acetate  1,334 mg Oral TID WC  . cinacalcet  60 mg Oral Q breakfast  .  fluticasone furoate-vilanterol  1 puff Inhalation Daily  . ipratropium-albuterol  3 mL Nebulization Once  . isosorbide mononitrate  30 mg Oral Daily  . ketorolac  30 mg Intravenous Once  . metoprolol tartrate  12.5 mg Oral BID  . pantoprazole (PROTONIX) IV  40 mg Intravenous Q12H   albuterol, hydrALAZINE, ipratropium-albuterol, ondansetron  Assessment/ Plan:  [name] is a 60 y.o. 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 End Stage Renal Disease: TTS [facility] With hyperkalemia on admission requiring emergent dialysis. Last hemodialysis treatment Saturday, tolerated treatment.   Continue TTS schedule Extra HD monday for acute Pulm edema   2. Anemia of chronic kidney disease: hemoglobin 8 with hematemesis. Status post transfusion 3/31 - Appreciate GI input.  - epo with treatment  3. Secondary Hyperparathyroidism: PTH >2600 on 3/23  as outpatient - Cinacalcet - calcium acetate for binding.      LOS: 5 Lariah Fleer 4/4/201712:18 PM

## 2015-04-23 NOTE — Progress Notes (Signed)
Pre hd 

## 2015-04-23 NOTE — Discharge Instructions (Signed)
Check your blood sugar- when you feel weak, Sweaty or sleepy. If < 70, need to take some juice with sugar, and if < 50 need to call 911.

## 2015-04-23 NOTE — Consult Note (Signed)
If patient has falling hgb will need to repeat EGD in the future.  She had a colonoscopy last year with removal of polyps and had bleeding from the polypectomy sites and had a second colonosocopy with clipping of the sites 6 days after the first procedure.  If her hgb falls significantly or other evidence of bleeding will need to do repeat EGD.

## 2015-04-26 ENCOUNTER — Telehealth: Payer: Self-pay | Admitting: Pulmonary Disease

## 2015-04-26 NOTE — Telephone Encounter (Signed)
Called [facility] and the state that Duoneb was ran thru as COPD w/Part B and shows pt is medicare ineligible. Called son to inform and he states he will call [facility] b/c pt has no other insurance. Nothing further needed.

## 2015-04-26 NOTE — Telephone Encounter (Signed)
Son Yvone Neu calling stating [facility] cannot fill inhaler medicine as it is not coded right for insurance to pay.    [facility] al[facility]ol code invalid for medicare please change and resubmit.

## 2015-04-29 NOTE — Telephone Encounter (Signed)
Spoke with son and he states they haven't been able to pick up duoneb. Informed son he would need to contact Medicare b/c the rx has been sent in with dx code and pharmacy states she is medicare ineligible. Son states he will contact medicare to see what is going on with pt's insurance.

## 2015-04-29 NOTE — Telephone Encounter (Signed)
Kelsey Peterson from [facility] wants to talk to you about the duoneb rx.  Still unable to refill rx.

## 2015-05-02 LAB — SULFONYLUREA HYPOGLYCEMICS PANEL, SERUM
ACETOHEXAMIDE: NEGATIVE ug/mL (ref 20–60)
Chlorpropamide: NEGATIVE ug/mL (ref 75–250)
GLIMEPIRIDE: NEGATIVE ng/mL (ref 80–250)
GLYBURIDE: NEGATIVE ng/mL
Glipizide: NEGATIVE ng/mL (ref 200–1000)
NATEGLINIDE: NEGATIVE ng/mL
Repaglinide: NEGATIVE ng/mL
TOLBUTAMIDE: NEGATIVE ug/mL (ref 40–100)
Tolazamide: NEGATIVE ug/mL

## 2015-05-02 LAB — INSULIN AND C-PEPTIDE, SERUM
C-Peptide: 7.1 ng/mL — ABNORMAL HIGH (ref 1.1–4.4)
INSULIN: 4.2 u[IU]/mL (ref 2.6–24.9)

## 2015-05-12 ENCOUNTER — Emergency Department: Payer: Medicare Other

## 2015-05-12 ENCOUNTER — Emergency Department
Admission: EM | Admit: 2015-05-12 | Discharge: 2015-05-13 | Disposition: A | Discharge status: Home / Self Care | Payer: Medicare Other | Attending: Emergency Medicine | Admitting: Emergency Medicine

## 2015-05-12 DIAGNOSIS — Z992 Dependence on renal dialysis: Secondary | ICD-10-CM | POA: Insufficient documentation

## 2015-05-12 DIAGNOSIS — I132 Hypertensive heart and chronic kidney disease with heart failure and with stage 5 chronic kidney disease, or end stage renal disease: Secondary | ICD-10-CM | POA: Insufficient documentation

## 2015-05-12 DIAGNOSIS — Z91041 Radiographic dye allergy status: Secondary | ICD-10-CM | POA: Diagnosis not present

## 2015-05-12 DIAGNOSIS — I2510 Atherosclerotic heart disease of native coronary artery without angina pectoris: Secondary | ICD-10-CM | POA: Insufficient documentation

## 2015-05-12 DIAGNOSIS — R109 Unspecified abdominal pain: Secondary | ICD-10-CM | POA: Diagnosis not present

## 2015-05-12 DIAGNOSIS — Z885 Allergy status to narcotic agent status: Secondary | ICD-10-CM | POA: Diagnosis not present

## 2015-05-12 DIAGNOSIS — I5022 Chronic systolic (congestive) heart failure: Secondary | ICD-10-CM | POA: Insufficient documentation

## 2015-05-12 DIAGNOSIS — E875 Hyperkalemia: Secondary | ICD-10-CM | POA: Insufficient documentation

## 2015-05-12 DIAGNOSIS — R7989 Other specified abnormal findings of blood chemistry: Secondary | ICD-10-CM

## 2015-05-12 DIAGNOSIS — R112 Nausea with vomiting, unspecified: Secondary | ICD-10-CM | POA: Insufficient documentation

## 2015-05-12 DIAGNOSIS — Z79899 Other long term (current) drug therapy: Secondary | ICD-10-CM | POA: Diagnosis not present

## 2015-05-12 DIAGNOSIS — J449 Chronic obstructive pulmonary disease, unspecified: Secondary | ICD-10-CM | POA: Insufficient documentation

## 2015-05-12 DIAGNOSIS — R740 Nonspecific elevation of levels of transaminase and lactic acid dehydrogenase [LDH]: Secondary | ICD-10-CM | POA: Diagnosis not present

## 2015-05-12 DIAGNOSIS — N186 End stage renal disease: Secondary | ICD-10-CM | POA: Insufficient documentation

## 2015-05-12 LAB — LIPASE, BLOOD: LIPASE: 24 U/L (ref 11–51)

## 2015-05-12 LAB — COMPREHENSIVE METABOLIC PANEL
ALK PHOS: 120 U/L (ref 38–126)
ALT: 32 U/L (ref 14–54)
AST: 53 U/L — AB (ref 15–41)
Albumin: 3.8 g/dL (ref 3.5–5.0)
Anion gap: 17 — ABNORMAL HIGH (ref 5–15)
BUN: 35 mg/dL — AB (ref 6–20)
CALCIUM: 9.3 mg/dL (ref 8.9–10.3)
CO2: 23 mmol/L (ref 22–32)
CREATININE: 5.64 mg/dL — AB (ref 0.44–1.00)
Chloride: 96 mmol/L — ABNORMAL LOW (ref 101–111)
GFR, EST AFRICAN AMERICAN: 9 mL/min — AB (ref >60)
GFR, EST NON AFRICAN AMERICAN: 7 mL/min — AB (ref >60)
Glucose, Bld: 79 mg/dL (ref 65–99)
Potassium: 5.9 mmol/L — ABNORMAL HIGH (ref 3.5–5.1)
Sodium: 136 mmol/L (ref 135–145)
Total Bilirubin: 2 mg/dL — ABNORMAL HIGH (ref 0.3–1.2)
Total Protein: 7.6 g/dL (ref 6.5–8.1)

## 2015-05-12 LAB — CBC WITH DIFFERENTIAL/PLATELET
BASOS ABS: 0.2 10*3/uL — AB (ref 0–0.1)
Basophils Relative: 4 %
EOS PCT: 2 %
Eosinophils Absolute: 0.1 10*3/uL (ref 0–0.7)
HCT: 24.4 % — ABNORMAL LOW (ref 35.0–47.0)
HEMOGLOBIN: 8.3 g/dL — AB (ref 12.0–16.0)
LYMPHS ABS: 0.9 10*3/uL — AB (ref 1.0–3.6)
Lymphocytes Relative: 17 %
MCH: 26.9 pg (ref 26.0–34.0)
MCHC: 34 g/dL (ref 32.0–36.0)
MCV: 79.1 fL — AB (ref 80.0–100.0)
Monocytes Absolute: 0.5 10*3/uL (ref 0.2–0.9)
Monocytes Relative: 9 %
Neutro Abs: 3.7 10*3/uL (ref 1.4–6.5)
Neutrophils Relative %: 68 %
PLATELETS: 243 10*3/uL (ref 150–440)
RBC: 3.09 MIL/uL — AB (ref 3.80–5.20)
RDW: 24.4 % — ABNORMAL HIGH (ref 11.5–14.5)
WBC: 5.3 10*3/uL (ref 3.6–11.0)

## 2015-05-12 LAB — TROPONIN I: TROPONIN I: 0.07 ng/mL — AB (ref <0.031)

## 2015-05-12 MED ORDER — ONDANSETRON HCL 4 MG/2ML IJ SOLN
4.0000 mg | Freq: Once | INTRAMUSCULAR | Status: AC
  Administered 2015-05-13: 4 mg via INTRAVENOUS
  Filled 2015-05-12: qty 2

## 2015-05-12 MED ORDER — NITROGLYCERIN 0.4 MG SL SUBL
0.4000 mg | SUBLINGUAL_TABLET | SUBLINGUAL | Status: DC | PRN
  Administered 2015-05-13 (×3): 0.4 mg via SUBLINGUAL
  Filled 2015-05-12: qty 1

## 2015-05-12 NOTE — ED Provider Notes (Signed)
[facility] Emergency Department Provider Note  ____________________________________________  Time seen: Approximately 2307 PM  I have reviewed the triage vital signs and the nursing notes.   HISTORY  Chief Complaint Flank Pain and Hypertension    HPI Kelsey Peterson is a 60 y.o. female who comes into the hospital today reporting that her blood pressure was going up. Her home health care nurse came in and told her blood pressure was up. The patient reports that she has been vomiting most of the day and has not taken any of her medication. She had dialysis yesterday and was fine but reports that the vomiting started last night. At first she thought it was something that she ate and she had no appetite today but she is also having some abdomen pain, back pain and headache. She reports that the aching. The patient denies any diarrhea just soft stool. She has no sick contacts reports that she stays by herself. She's had no fever but has continued nausea. She took her home medicine before EMS brought her here. She reports that she does feel some shortness of breath as well as chest pain and all started last night as well. She reports she's been up and down all night despite that she's felt unwell. The patient is here for evaluation and wants to know if she needs to stay in the hospital.   Past Medical History  Diagnosis Date  . ESRD on hemodialysis ([place])   . Secondary hyperparathyroidism of renal origin ([place])   . Hepatitis C   . Migraine with aura   . HOH (hard of hearing)   . Mitral regurgitation     a. calcified mitral annulus, mod MR by echo in 06/2014  . Cognitive impairment   . CAD (coronary artery disease)   . HTN (hypertension)   . Chronic systolic CHF (congestive heart failure) ([facility])     a. EF 45% by echo in 06/2014  . Paroxysmal SVT (supraventricular tachycardia) ([place])     a. history of this, with last known occurrence in 06/2014  . COPD (chronic  obstructive pulmonary disease) ([place]) 07/14/2014  . GERD (gastroesophageal reflux disease) 07/14/2014  . Anemia of chronic disease     Patient Active Problem List   Diagnosis Date Noted  . Hypoglycemia 04/18/2015  . Upper GI bleed 04/18/2015  . Elevated troponin 04/18/2015  . ESRD on hemodialysis ([place]) 04/18/2015  . Volume depletion, gastrointestinal loss 04/18/2015  . Uncontrolled hypertension 04/18/2015  . Acute respiratory failure with hypoxia ([place]) 04/02/2015  . Pleural effusion, left 04/02/2015  . Extreme hearing loss 03/26/2015  . Acute on chronic systolic heart failure ([place]) 02/28/2015  . Cholelithiasis 12/01/2014  . Hyperkalemia 09/01/2014  . Fluid overload 09/01/2014  . GERD (gastroesophageal reflux disease) 07/14/2014  . COPD (chronic obstructive pulmonary disease) ([place]) 07/14/2014  . HTN (hypertension) 07/14/2014  . CAD (coronary artery disease) 07/14/2014  . End stage renal disease on dialysis ([place])   . Supraventricular tachycardia ([place])   . NSVT (nonsustained ventricular tachycardia) ([place])   . Anemia 07/03/2014  . Diarrhea     Past Surgical History  Procedure Laterality Date  . Dialysis fistula creation    . Tubal ligation    . Colonoscopy with propofol Left 07/09/2014    Procedure: COLONOSCOPY WITH PROPOFOL;  Surgeon: Hulen Luster, MD;  Location: [facility] ENDOSCOPY;  Service: Endoscopy;  Laterality: Left;  . Colonoscopy N/A 07/15/2014    Procedure: COLONOSCOPY;  Surgeon: Manya Silvas, MD;  Location:  [facility] ENDOSCOPY;  Service: Endoscopy;  Laterality: N/A;  . Esophagogastroduodenoscopy N/A 04/22/2015    Procedure: ESOPHAGOGASTRODUODENOSCOPY (EGD);  Surgeon: Manya Silvas, MD;  Location: [facility] ENDOSCOPY;  Service: Endoscopy;  Laterality: N/A;    Current Outpatient Rx  Name  Route  Sig  Dispense  Refill  . SPIRIVA HANDIHALER 18 MCG inhalation capsule   Oral   Take 1 puff by mouth daily.           Dispense as written.   Marland Kitchen albuterol (PROVENTIL HFA;VENTOLIN HFA) 108  (90 Base) MCG/ACT inhaler   Inhalation   Inhale 2 puffs into the lungs every 6 (six) hours as needed for wheezing or shortness of breath.   1 Inhaler   2   . amiodarone (PACERONE) 200 MG tablet   Oral   Take 1 tablet (200 mg total) by mouth daily.         Marland Kitchen amLODipine (NORVASC) 5 MG tablet   Oral   Take 5 mg by mouth daily.         . calcium acetate (PHOSLO) 667 MG capsule   Oral   Take 2 capsules by mouth 3 (three) times daily with meals. And one capsule with snakes         . cinacalcet (SENSIPAR) 60 MG tablet   Oral   Take 60 mg by mouth daily.         . clopidogrel (PLAVIX) 75 MG tablet   Oral   Take 75 mg by mouth daily.         . Fluticasone-Salmeterol (ADVAIR DISKUS) 250-50 MCG/DOSE AEPB   Inhalation   Inhale 1 puff into the lungs 2 (two) times daily.   60 each   0   . folic acid-vitamin b complex-vitamin c-selenium-zinc (DIALYVITE) 3 MG TABS tablet   Oral   Take 1 tablet by mouth daily.         . furosemide (LASIX) 80 MG tablet   Oral   Take 80 mg by mouth daily. Patient takes on non dialysis days.         Marland Kitchen ipratropium-albuterol (DUONEB) 0.5-2.5 (3) MG/3ML SOLN   Nebulization   Take 3 mLs by nebulization 4 (four) times daily as needed. DX: COPD DX Code: J44.9   360 mL   11   . ipratropium-albuterol (DUONEB) 0.5-2.5 (3) MG/3ML SOLN   Nebulization   Take 3 mLs by nebulization every 4 (four) hours as needed.   360 mL   0   . isosorbide mononitrate (IMDUR) 30 MG 24 hr tablet   Oral   Take 30 mg by mouth daily.         . metoprolol tartrate (LOPRESSOR) 25 MG tablet   Oral   Take 12.5 mg by mouth 2 (two) times daily.         . ondansetron (ZOFRAN) 4 MG tablet   Oral   Take 4 mg by mouth every 8 (eight) hours as needed for nausea or vomiting. Reported on 02/05/2015         . pantoprazole (PROTONIX) 40 MG tablet   Oral   Take 1 tablet (40 mg total) by mouth 2 (two) times daily.   60 tablet   0   . sodium polystyrene (KAYEXALATE)  15 GM/60ML suspension   Oral   Take 60 mLs (15 g total) by mouth once.   120 mL   0     Allergies Contrast media; Morphine and related; and Other  Family History  Problem  Relation Age of Onset  . Heart disease Mother   . Hypertension Mother     Social History Social History  Substance Use Topics  . Smoking status: Never Smoker   . Smokeless tobacco: Never Used  . Alcohol Use: No    Review of Systems Constitutional: No fever/chills Eyes: No visual changes. ENT: No sore throat. Cardiovascular:  chest pain. Respiratory:  shortness of breath. Gastrointestinal:  abdominal pain.   Nausea,  vomiting.  No diarrhea.  No constipation. Genitourinary: Negative for dysuria. Musculoskeletal:  back pain. Skin: Negative for rash. Neurological: Headache  10-point ROS otherwise negative.  ____________________________________________   PHYSICAL EXAM:  VITAL SIGNS: ED Triage Vitals  Enc Vitals Group     BP 05/12/15 2159 172/109 mmHg     Pulse Rate 05/12/15 2159 98     Resp 05/12/15 2159 26     Temp 05/12/15 2159 98 F (36.7 C)     Temp Source 05/12/15 2159 Oral     SpO2 05/12/15 2153 99 %     Weight --      Height --      Head Cir --      Peak Flow --      Pain Score 05/12/15 2201 8     Pain Loc --      Pain Edu? --      Excl. in [place]? --     Constitutional: Alert and oriented. Well appearing and in Mild distress. Eyes: Conjunctivae are normal. PERRL. EOMI. Head: Atraumatic. Nose: No congestion/rhinnorhea. Mouth/Throat: Mucous membranes are moist.  Oropharynx non-erythematous. Cardiovascular: Normal rate, regular rhythm. Grossly normal heart sounds.  Good peripheral circulation. Respiratory: Normal respiratory effort.  No retractions. Lungs CTAB. Gastrointestinal: Soft with right-sided tenderness to palpation. No distention. Positive bowel sounds Musculoskeletal: No lower extremity tenderness nor edema.   Neurologic:  Normal speech and language.  Skin:  Skin is  warm, dry and intact.  Psychiatric: Mood and affect are normal.   ____________________________________________   LABS (all labs ordered are listed, but only abnormal results are displayed)  Labs Reviewed  COMPREHENSIVE METABOLIC PANEL - Abnormal; Notable for the following:    Potassium 5.9 (*)    Chloride 96 (*)    BUN 35 (*)    Creatinine, Ser 5.64 (*)    AST 53 (*)    Total Bilirubin 2.0 (*)    GFR calc non Af Amer 7 (*)    GFR calc Af Amer 9 (*)    Anion gap 17 (*)    All other components within normal limits  CBC WITH DIFFERENTIAL/PLATELET - Abnormal; Notable for the following:    RBC 3.09 (*)    Hemoglobin 8.3 (*)    HCT 24.4 (*)    MCV 79.1 (*)    RDW 24.4 (*)    Lymphs Abs 0.9 (*)    Basophils Absolute 0.2 (*)    All other components within normal limits  TROPONIN I - Abnormal; Notable for the following:    Troponin I 0.07 (*)    All other components within normal limits  LACTIC ACID, PLASMA - Abnormal; Notable for the following:    Lactic Acid, Venous 2.7 (*)    All other components within normal limits  LIPASE, BLOOD   ____________________________________________  EKG  ED ECG REPORT I, Loney Hering, the attending physician, personally viewed and interpreted this ECG.   Date: 05/12/2015  EKG Time: 2206  Rate: 96  Rhythm: normal sinus rhythm  Axis: normal  Intervals:none  ST&T Change: none  ____________________________________________  RADIOLOGY  CXR: Chronic moderate left pleural effusion, stable since prior ____________________________________________   PROCEDURES  Procedure(s) performed: None  Critical Care performed: No  ____________________________________________   INITIAL IMPRESSION / ASSESSMENT AND PLAN / ED COURSE  Pertinent labs & imaging results that were available during my care of the patient were reviewed by me and considered in my medical decision making (see chart for details).  This is a 60 year old female who  comes into the hospital today with some vomiting and elevated blood pressure. The patient reports that she has been vomiting and feeling short of breath with chest pain ever since last night after dialysis. The patient does have some lower abdominal pain on exam. I will do a CT scan of the patient to evaluate her abdomen. She will have some oral contrast. She does have a mildly elevated troponin but is chronic given her dialysis state.  Patient stated that she wanted to leave the hospital. She reports that she is angry and she does not want a CT scan. I informed her that she would have to sign out [place]. We were discussing her labs after she took off her leads and contacted her family she reports that she may consider staying she did not have to do a CT scan. When the patient's family arrived they report that the patient was fine yesterday. Although the patient said she had belly pain and chest pain and vomiting all day the patient's family reports that she had not had any of those symptoms. She reports that she had been with her grandchildren most of the day and did not start having complaints until after EMS arrived. I did contact nephrology Dr. Juleen China who felt that the patient could be discharged with some Kayexalate. He reports that she has some chronically elevated lactic acid especially as she had 3 L taken off yesterday. The patient was able to drink her Kayexalate here without any difficulty and appeared to be well. I will discharge her to home and have her follow back up with Dr. Holley Raring today. She has no complaints or concerns at this time. After speaking with her family member we did not have her sign out [place] as her complaints had changed. ____________________________________________   FINAL CLINICAL IMPRESSION(S) / ED DIAGNOSES  Final diagnoses:  Nausea and vomiting, vomiting of unspecified type  Abdominal pain, unspecified abdominal location  Hyperkalemia   Elevated lactic acid level      Loney Hering, MD 05/13/15 [phone]

## 2015-05-12 NOTE — ED Notes (Signed)
Per EMS: PT presents with left/right flank cramping and back cramping after being dialyzed yesterday. Pt has fistula in right arm. Pt has hx of rneal failure, diabetes, CHF, Hep C, and COPD. Pt still able to make urine. Pt did not take meds all day. Pt took meds with EMS present before arriving to the hospital.  Per patient. Pt c/o nausea and vomiting.

## 2015-05-12 NOTE — ED Notes (Signed)
PT was dialyzed yesterday. Pt was 81kg prior, 77 after. 3L was taken off

## 2015-05-13 DIAGNOSIS — R112 Nausea with vomiting, unspecified: Secondary | ICD-10-CM | POA: Diagnosis not present

## 2015-05-13 LAB — LACTIC ACID, PLASMA: LACTIC ACID, VENOUS: 2.7 mmol/L — AB (ref 0.5–2.0)

## 2015-05-13 MED ORDER — IPRATROPIUM-ALBUTEROL 0.5-2.5 (3) MG/3ML IN SOLN
RESPIRATORY_TRACT | Status: AC
  Filled 2015-05-13: qty 3

## 2015-05-13 MED ORDER — SODIUM POLYSTYRENE SULFONATE 15 GM/60ML PO SUSP
30.0000 g | Freq: Once | ORAL | Status: AC
  Administered 2015-05-13: 30 g via ORAL
  Filled 2015-05-13: qty 120

## 2015-05-13 MED ORDER — SODIUM CHLORIDE 0.9 % IV BOLUS (SEPSIS)
250.0000 mL | Freq: Once | INTRAVENOUS | Status: AC
  Administered 2015-05-13: 250 mL via INTRAVENOUS

## 2015-05-13 MED ORDER — SODIUM BICARBONATE 8.4 % IV SOLN
50.0000 meq | Freq: Once | INTRAVENOUS | Status: AC
  Administered 2015-05-13: 50 meq via INTRAVENOUS
  Filled 2015-05-13: qty 50

## 2015-05-13 MED ORDER — METOCLOPRAMIDE HCL 5 MG/ML IJ SOLN
10.0000 mg | Freq: Once | INTRAMUSCULAR | Status: AC
  Administered 2015-05-13: 10 mg via INTRAVENOUS
  Filled 2015-05-13: qty 2

## 2015-05-13 MED ORDER — SODIUM POLYSTYRENE SULFONATE 15 GM/60ML PO SUSP
15.0000 g | Freq: Once | ORAL | Status: AC
  Administered 2015-05-13: 15 g via ORAL
  Filled 2015-05-13: qty 60

## 2015-05-13 MED ORDER — SODIUM POLYSTYRENE SULFONATE 15 GM/60ML PO SUSP: 15.0000 g | Freq: Once | ORAL | Status: DC

## 2015-05-13 MED ORDER — IPRATROPIUM-ALBUTEROL 0.5-2.5 (3) MG/3ML IN SOLN
3.0000 mL | Freq: Once | RESPIRATORY_TRACT | Status: AC
  Administered 2015-05-13: 3 mL via RESPIRATORY_TRACT

## 2015-05-13 MED ORDER — BARIUM SULFATE 2.1 % PO SUSP: 450.0000 mL | ORAL | Status: AC

## 2015-05-13 NOTE — ED Notes (Signed)
Reviewed d/c instructions, follow-up care, and prescriptions with pt and pt's family. Pt and family verbalized understanding

## 2015-05-13 NOTE — ED Notes (Signed)
Pt vomited kayexalate. MD informed

## 2015-05-13 NOTE — ED Notes (Signed)
Pt placed on bedpan. Pt unable to urinate at this time 

## 2015-05-13 NOTE — ED Notes (Signed)
Pt refusing CT scan. Pt requesting to leave hospital.  MD notified

## 2015-05-13 NOTE — ED Notes (Signed)
MD to bediside

## 2015-05-13 NOTE — Discharge Instructions (Signed)
At this time you have decided to leave the hospital against medical advice. We have not been able to thoroughly examine you to discover the cause of your illness and we have not been able to resolve all of your symptoms. You accept the risk that leaving the hospital my result in death or permanent disability. You may return if your symptoms are worse and you unable to handle them at home. Otherwise you may follow up with your primary care physician.   Abdominal Pain, Adult Many things can cause abdominal pain. Usually, abdominal pain is not caused by a disease and will improve without treatment. It can often be observed and treated at home. Your health care provider will do a physical exam and possibly order blood tests and X-rays to help determine the seriousness of your pain. However, in many cases, more time must pass before a clear cause of the pain can be found. Before that point, your health care provider may not know if you need more testing or further treatment. HOME CARE INSTRUCTIONS Monitor your abdominal pain for any changes. The following actions may help to alleviate any discomfort you are experiencing:  Only take over-the-counter or prescription medicines as directed by your health care provider.  Do not take laxatives unless directed to do so by your health care provider.  Try a clear liquid diet (broth, tea, or water) as directed by your health care provider. Slowly move to a bland diet as tolerated. SEEK MEDICAL CARE IF:  You have unexplained abdominal pain.  You have abdominal pain associated with nausea or diarrhea.  You have pain when you urinate or have a bowel movement.  You experience abdominal pain that wakes you in the night.  You have abdominal pain that is worsened or improved by eating food.  You have abdominal pain that is worsened with eating fatty foods.  You have a fever. SEEK IMMEDIATE MEDICAL CARE IF:  Your pain does not go away within 2 hours.  You  keep throwing up (vomiting).  Your pain is felt only in portions of the abdomen, such as the right side or the left lower portion of the abdomen.  You pass bloody or black tarry stools. MAKE SURE YOU:  Understand these instructions.  Will watch your condition.  Will get help right away if you are not doing well or get worse.   This information is not intended to replace advice given to you by your health care provider. Make sure you discuss any questions you have with your health care provider.   Document Released: 10/15/2004 Document Revised: 09/26/2014 Document Reviewed: 09/14/2012 Elsevier Interactive Patient Education 2016 Else[employer] Inc[employer]  Hyperkalemia Hyperkalemia is when you have too much potassium in your blood. Potassium is normally removed (excreted) from your body by your kidneys. If there is too much potassium in your blood, it can affect how your heart works. HOME CARE  Take medicines only as told by your doctor.  Do not take any supplements, natural products, herbs, or vitamins unless your doctor says it is okay.  Limit your alcohol intake as told by your doctor.  Stop illegal drug use. If you need help quitting, ask your doctor.  Keep all follow-up visits as told by your doctor. This is important.  If you have kidney disease, you may need to follow a low potassium diet. A food specialist (dietitian) can help you. GET HELP IF:   Your heartbeat is not regular or very slow.  You feel dizzy (light-headed).  You feel weak.  You feel sick to your stomach (nauseous).  You have tingling in your hands or feet.  You cannot feel your hands or feet. GET HELP RIGHT AWAY IF:  You are short of breath.  You have chest pain.  You pass out (faint).  You cannot move your muscles. MAKE SURE YOU:   Understand these instructions.  Will watch your condition.  Will get help right away if you are not doing well or get worse.   This information is not intended to  replace advice given to you by your health care provider. Make sure you discuss any questions you have with your health care provider.   Document Released: 01/05/2005 Document Revised: 01/26/2014 Document Reviewed: 04/12/2013 Elsevier Interactive Patient Education 2016 Elsevier Inc.  Nausea and Vomiting Nausea is a sick feeling that often comes before throwing up (vomiting). Vomiting is a reflex where stomach contents come out of your mouth. Vomiting can cause severe loss of body fluids (dehydration). Children and elderly adults can become dehydrated quickly, especially if they also have diarrhea. Nausea and vomiting are symptoms of a condition or disease. It is important to find the cause of your symptoms. CAUSES   Direct irritation of the stomach lining. This irritation can result from increased acid production (gastroesophageal reflux disease), infection, food poisoning, taking certain medicines (such as nonsteroidal anti-inflammatory drugs), alcohol use, or tobacco use.  Signals from the brain.These signals could be caused by a headache, heat exposure, an inner ear disturbance, increased pressure in the brain from injury, infection, a tumor, or a concussion, pain, emotional stimulus, or metabolic problems.  An obstruction in the gastrointestinal tract (bowel obstruction).  Illnesses such as diabetes, hepatitis, gallbladder problems, appendicitis, kidney problems, cancer, sepsis, atypical symptoms of a heart attack, or eating disorders.  Medical treatments such as chemotherapy and radiation.  Receiving medicine that makes you sleep (general anesthetic) during surgery. DIAGNOSIS Your caregiver may ask for tests to be done if the problems do not improve after a few days. Tests may also be done if symptoms are severe or if the reason for the nausea and vomiting is not clear. Tests may include:  Urine tests.  Blood tests.  Stool tests.  Cultures (to look for evidence of  infection).  X-rays or other imaging studies. Test results can help your caregiver make decisions about treatment or the need for additional tests. TREATMENT You need to stay well hydrated. Drink frequently but in small amounts.You may wish to drink water, sports drinks, clear broth, or eat frozen ice pops or gelatin dessert to help stay hydrated.When you eat, eating slowly may help prevent nausea.There are also some antinausea medicines that may help prevent nausea. HOME CARE INSTRUCTIONS   Take all medicine as directed by your caregiver.  If you do not have an appetite, do not force yourself to eat. However, you must continue to drink fluids.  If you have an appetite, eat a normal diet unless your caregiver tells you differently.  Eat a variety of complex carbohydrates (rice, wheat, potatoes, bread), lean meats, yogurt, fruits, and vegetables.  Avoid high-fat foods because they are more difficult to digest.  Drink enough water and fluids to keep your urine clear or pale yellow.  If you are dehydrated, ask your caregiver for specific rehydration instructions. Signs of dehydration may include:  Severe thirst.  Dry lips and mouth.  Dizziness.  Dark urine.  Decreasing urine frequency and amount.  Confusion.  Rapid breathing or pulse. SEEK IMMEDIATE  MEDICAL CARE IF:   You have blood or brown flecks (like coffee grounds) in your vomit.  You have black or bloody stools.  You have a severe headache or stiff neck.  You are confused.  You have severe abdominal pain.  You have chest pain or trouble breathing.  You do not urinate at least once every 8 hours.  You develop cold or clammy skin.  You continue to vomit for longer than 24 to 48 hours.  You have a fever. MAKE SURE YOU:   Understand these instructions.  Will watch your condition.  Will get help right away if you are not doing well or get worse.   This information is not intended to replace advice  given to you by your health care provider. Make sure you discuss any questions you have with your health care provider.   Document Released: 01/05/2005 Document Revised: 03/30/2011 Document Reviewed: 06/04/2010 Elsevier Interactive Patient Education [employer].

## 2015-06-10 ENCOUNTER — Emergency Department: Payer: Medicare Other

## 2015-06-10 ENCOUNTER — Encounter: Payer: Self-pay | Admitting: *Deleted

## 2015-06-10 ENCOUNTER — Emergency Department
Admission: EM | Admit: 2015-06-10 | Discharge: 2015-06-10 | Disposition: A | Discharge status: Home / Self Care | Payer: Medicare Other | Attending: Emergency Medicine | Admitting: Emergency Medicine

## 2015-06-10 DIAGNOSIS — Z79899 Other long term (current) drug therapy: Secondary | ICD-10-CM | POA: Diagnosis not present

## 2015-06-10 DIAGNOSIS — Z7902 Long term (current) use of antithrombotics/antiplatelets: Secondary | ICD-10-CM | POA: Insufficient documentation

## 2015-06-10 DIAGNOSIS — Z7951 Long term (current) use of inhaled steroids: Secondary | ICD-10-CM | POA: Diagnosis not present

## 2015-06-10 DIAGNOSIS — I2510 Atherosclerotic heart disease of native coronary artery without angina pectoris: Secondary | ICD-10-CM | POA: Diagnosis not present

## 2015-06-10 DIAGNOSIS — I5023 Acute on chronic systolic (congestive) heart failure: Secondary | ICD-10-CM | POA: Diagnosis not present

## 2015-06-10 DIAGNOSIS — Z8679 Personal history of other diseases of the circulatory system: Secondary | ICD-10-CM | POA: Diagnosis not present

## 2015-06-10 DIAGNOSIS — N2581 Secondary hyperparathyroidism of renal origin: Secondary | ICD-10-CM | POA: Insufficient documentation

## 2015-06-10 DIAGNOSIS — R0600 Dyspnea, unspecified: Secondary | ICD-10-CM | POA: Insufficient documentation

## 2015-06-10 DIAGNOSIS — R0602 Shortness of breath: Secondary | ICD-10-CM | POA: Diagnosis present

## 2015-06-10 DIAGNOSIS — I132 Hypertensive heart and chronic kidney disease with heart failure and with stage 5 chronic kidney disease, or end stage renal disease: Secondary | ICD-10-CM | POA: Insufficient documentation

## 2015-06-10 DIAGNOSIS — J449 Chronic obstructive pulmonary disease, unspecified: Secondary | ICD-10-CM | POA: Insufficient documentation

## 2015-06-10 DIAGNOSIS — N186 End stage renal disease: Secondary | ICD-10-CM | POA: Diagnosis not present

## 2015-06-10 DIAGNOSIS — N2889 Other specified disorders of kidney and ureter: Secondary | ICD-10-CM

## 2015-06-10 DIAGNOSIS — Z992 Dependence on renal dialysis: Secondary | ICD-10-CM | POA: Insufficient documentation

## 2015-06-10 DIAGNOSIS — I151 Hypertension secondary to other renal disorders: Secondary | ICD-10-CM

## 2015-06-10 LAB — HEPATIC FUNCTION PANEL
ALK PHOS: 99 U/L (ref 38–126)
ALT: 22 U/L (ref 14–54)
AST: 37 U/L (ref 15–41)
Albumin: 3.8 g/dL (ref 3.5–5.0)
BILIRUBIN DIRECT: 1.4 mg/dL — AB (ref 0.1–0.5)
BILIRUBIN TOTAL: 2.8 mg/dL — AB (ref 0.3–1.2)
Indirect Bilirubin: 1.4 mg/dL — ABNORMAL HIGH (ref 0.3–0.9)
Total Protein: 7.5 g/dL (ref 6.5–8.1)

## 2015-06-10 LAB — CBC WITH DIFFERENTIAL/PLATELET
BASOS ABS: 0.1 10*3/uL (ref 0–0.1)
Basophils Relative: 1 %
Eosinophils Absolute: 0 10*3/uL (ref 0–0.7)
Eosinophils Relative: 1 %
HEMATOCRIT: 23.8 % — AB (ref 35.0–47.0)
HEMOGLOBIN: 7.7 g/dL — AB (ref 12.0–16.0)
LYMPHS PCT: 12 %
Lymphs Abs: 0.6 10*3/uL — ABNORMAL LOW (ref 1.0–3.6)
MCH: 26 pg (ref 26.0–34.0)
MCHC: 32.5 g/dL (ref 32.0–36.0)
MCV: 79.9 fL — AB (ref 80.0–100.0)
Monocytes Absolute: 0.4 10*3/uL (ref 0.2–0.9)
Monocytes Relative: 8 %
NEUTROS ABS: 4.1 10*3/uL (ref 1.4–6.5)
Neutrophils Relative %: 78 %
Platelets: 232 10*3/uL (ref 150–440)
RBC: 2.98 MIL/uL — AB (ref 3.80–5.20)
RDW: 23 % — ABNORMAL HIGH (ref 11.5–14.5)
WBC: 5.3 10*3/uL (ref 3.6–11.0)

## 2015-06-10 LAB — LIPASE, BLOOD: Lipase: 22 U/L (ref 11–51)

## 2015-06-10 LAB — TROPONIN I: Troponin I: 0.08 ng/mL — ABNORMAL HIGH (ref <0.031)

## 2015-06-10 LAB — AMMONIA: AMMONIA: 19 umol/L (ref 9–35)

## 2015-06-10 MED ORDER — NITROGLYCERIN 0.4 MG SL SUBL
0.4000 mg | SUBLINGUAL_TABLET | SUBLINGUAL | Status: DC | PRN
  Administered 2015-06-10 (×2): 0.4 mg via SUBLINGUAL
  Filled 2015-06-10: qty 3

## 2015-06-10 NOTE — ED Provider Notes (Signed)
[facility] Emergency Department Provider Note  ____________________________________________  Time seen: 12:05 PM  I have reviewed the triage vital signs and the nursing notes.   HISTORY  Chief Complaint Shortness of Breath  Level 5 caveat:  Portions of the history and physical were unable to be obtained due to the patient's being a poor historian   HPI Kelsey Peterson is a 60 y.o. female who complains of shortness of breath for the past 2 days. She gets dialysis Tuesday Thursday and Saturday, last dialysis was Saturday and completed uneventfully. She had a full sessions without symptoms. She feels that she is volume overloaded and although she is scheduled for routine dialysis tomorrow she requested have dialysis today at the hospital. No cough fever dizziness or syncope. She complains of some abdominal cramping pain without vomiting or diarrhea.     Past Medical History  Diagnosis Date  . ESRD on hemodialysis ([place])   . Secondary hyperparathyroidism of renal origin ([place])   . Hepatitis C   . Migraine with aura   . HOH (hard of hearing)   . Mitral regurgitation     a. calcified mitral annulus, mod MR by echo in 06/2014  . Cognitive impairment   . CAD (coronary artery disease)   . HTN (hypertension)   . Chronic systolic CHF (congestive heart failure) (HCC)     a. EF 45% by echo in 06/2014  . Paroxysmal SVT (supraventricular tachycardia) ([place])     a. history of this, with last known occurrence in 06/2014  . COPD (chronic obstructive pulmonary disease) ([place]) 07/14/2014  . GERD (gastroesophageal reflux disease) 07/14/2014  . Anemia of chronic disease      Patient Active Problem List   Diagnosis Date Noted  . Hypoglycemia 04/18/2015  . Upper GI bleed 04/18/2015  . Elevated troponin 04/18/2015  . ESRD on hemodialysis ([place]) 04/18/2015  . Volume depletion, gastrointestinal loss 04/18/2015  . Uncontrolled hypertension 04/18/2015  . Acute respiratory  failure with hypoxia ([place]) 04/02/2015  . Pleural effusion, left 04/02/2015  . Extreme hearing loss 03/26/2015  . Acute on chronic systolic heart failure ([place]) 02/28/2015  . Cholelithiasis 12/01/2014  . Hyperkalemia 09/01/2014  . Fluid overload 09/01/2014  . GERD (gastroesophageal reflux disease) 07/14/2014  . COPD (chronic obstructive pulmonary disease) ([place]) 07/14/2014  . HTN (hypertension) 07/14/2014  . CAD (coronary artery disease) 07/14/2014  . End stage renal disease on dialysis ([place])   . Supraventricular tachycardia ([place])   . NSVT (nonsustained ventricular tachycardia) ([place])   . Anemia 07/03/2014  . Diarrhea      Past Surgical History  Procedure Laterality Date  . Dialysis fistula creation    . Tubal ligation    . Colonoscopy with propofol Left 07/09/2014    Procedure: COLONOSCOPY WITH PROPOFOL;  Surgeon: Hulen Luster, MD;  Location: [facility] ENDOSCOPY;  Service: Endoscopy;  Laterality: Left;  . Colonoscopy N/A 07/15/2014    Procedure: COLONOSCOPY;  Surgeon: Manya Silvas, MD;  Location: [facility] ENDOSCOPY;  Service: Endoscopy;  Laterality: N/A;  . Esophagogastroduodenoscopy N/A 04/22/2015    Procedure: ESOPHAGOGASTRODUODENOSCOPY (EGD);  Surgeon: Manya Silvas, MD;  Location: [facility] ENDOSCOPY;  Service: Endoscopy;  Laterality: N/A;     Current Outpatient Rx  Name  Route  Sig  Dispense  Refill  . albuterol (PROVENTIL HFA;VENTOLIN HFA) 108 (90 Base) MCG/ACT inhaler   Inhalation   Inhale 2 puffs into the lungs every 6 (six) hours as needed for wheezing or shortness of breath.   1  Inhaler   2   . amiodarone (PACERONE) 200 MG tablet   Oral   Take 1 tablet (200 mg total) by mouth daily.         Marland Kitchen amLODipine (NORVASC) 5 MG tablet   Oral   Take 5 mg by mouth daily.         . calcium acetate (PHOSLO) 667 MG capsule   Oral   Take 2 capsules by mouth 3 (three) times daily with meals. And one capsule with snakes         . cinacalcet (SENSIPAR) 60 MG tablet   Oral   Take 60  mg by mouth daily.         . clopidogrel (PLAVIX) 75 MG tablet   Oral   Take 75 mg by mouth daily.         . Fluticasone-Salmeterol (ADVAIR DISKUS) 250-50 MCG/DOSE AEPB   Inhalation   Inhale 1 puff into the lungs 2 (two) times daily.   60 each   0   . folic acid-vitamin b complex-vitamin c-selenium-zinc (DIALYVITE) 3 MG TABS tablet   Oral   Take 1 tablet by mouth daily.         . furosemide (LASIX) 80 MG tablet   Oral   Take 80 mg by mouth daily. Patient takes on non dialysis days.         Marland Kitchen ipratropium-albuterol (DUONEB) 0.5-2.5 (3) MG/3ML SOLN   Nebulization   Take 3 mLs by nebulization 4 (four) times daily as needed. DX: COPD DX Code: J44.9   360 mL   11   . ipratropium-albuterol (DUONEB) 0.5-2.5 (3) MG/3ML SOLN   Nebulization   Take 3 mLs by nebulization every 4 (four) hours as needed.   360 mL   0   . isosorbide mononitrate (IMDUR) 30 MG 24 hr tablet   Oral   Take 30 mg by mouth daily.         . metoprolol tartrate (LOPRESSOR) 25 MG tablet   Oral   Take 12.5 mg by mouth 2 (two) times daily.         . ondansetron (ZOFRAN) 4 MG tablet   Oral   Take 4 mg by mouth every 8 (eight) hours as needed for nausea or vomiting. Reported on 02/05/2015         . pantoprazole (PROTONIX) 40 MG tablet   Oral   Take 1 tablet (40 mg total) by mouth 2 (two) times daily.   60 tablet   0   . sodium polystyrene (KAYEXALATE) 15 GM/60ML suspension   Oral   Take 60 mLs (15 g total) by mouth once.   120 mL   0   . SPIRIVA HANDIHALER 18 MCG inhalation capsule   Oral   Take 1 puff by mouth daily.           Dispense as written.      Allergies Contrast media; Morphine and related; and Other   Family History  Problem Relation Age of Onset  . Heart disease Mother   . Hypertension Mother     Social History Social History  Substance Use Topics  . Smoking status: Never Smoker   . Smokeless tobacco: Never Used  . Alcohol Use: No    Review of  Systems  Constitutional:   No fever or chills.  Eyes:   No vision changes.  ENT:   No sore throat. No rhinorrhea. Cardiovascular:   No chest pain. Respiratory:   Positive SOB, acute  on chronic. Gastrointestinal:   Negative for abdominal pain, vomiting and diarrhea.  Genitourinary:   Negative for dysuria or difficulty urinating. Musculoskeletal:   Negative for focal pain or swelling Neurological:   Negative for headaches 10-point ROS otherwise negative.  ____________________________________________   PHYSICAL EXAM:  VITAL SIGNS: ED Triage Vitals  Enc Vitals Group     BP 06/10/15 1101 164/111 mmHg     Pulse Rate 06/10/15 1101 83     Resp 06/10/15 1101 24     Temp 06/10/15 1101 97.1 F (36.2 C)     Temp Source 06/10/15 1101 Oral     SpO2 06/10/15 1101 99 %     Weight 06/10/15 1101 174 lb 11.2 oz (79.243 kg)     Height 06/10/15 1101 5\' 3"  (1.6 m)     Head Cir --      Peak Flow --      Pain Score 06/10/15 1100 0     Pain Loc --      Pain Edu? --      Excl. in [place]? --     Vital signs reviewed, nursing assessments reviewed.   Constitutional:   Alert and oriented. Well appearing and in no distress. Eyes:   No scleral icterus. No conjunctival pallor. PERRL. EOMI.  No nystagmus. ENT   Head:   Normocephalic and atraumatic.   Nose:   No congestion/rhinnorhea. No septal hematoma   Mouth/Throat:   MMM, no pharyngeal erythema. No peritonsillar mass.    Neck:   No stridor. No SubQ emphysema. No meningismus.Chronically engorged neck veins with many prominent nonpulsatile vessels Hematological/Lymphatic/Immunilogical:   No cervical lymphadenopathy. Cardiovascular:   RRR. Symmetric bilateral radial and DP pulses.  No murmurs.  Respiratory:   Normal work of breathing, positive tachypnea. Breath sounds are clear and equal bilaterally. Crackles at the left base  Gastrointestinal:   Soft and nontender. Non distended. There is no CVA tenderness.  No rebound, rigidity, or  guarding. Genitourinary:   deferred Musculoskeletal:   Nontender with normal range of motion in all extremities. No joint effusions.  No lower extremity tenderness.  No edema. Neurologic:   Normal speech and language.  CN 2-10 normal. Motor grossly intact. No gross focal neurologic deficits are appreciated.  Skin:    Skin is warm, dry and intact. No rash noted.  No petechiae, purpura, or bullae.  ____________________________________________    LABS (pertinent positives/negatives) (all labs ordered are listed, but only abnormal results are displayed) Labs Reviewed  CBC WITH DIFFERENTIAL/PLATELET - Abnormal; Notable for the following:    RBC 2.98 (*)    Hemoglobin 7.7 (*)    HCT 23.8 (*)    MCV 79.9 (*)    RDW 23.0 (*)    Lymphs Abs 0.6 (*)    All other components within normal limits  HEPATIC FUNCTION PANEL - Abnormal; Notable for the following:    Total Bilirubin 2.8 (*)    Bilirubin, Direct 1.4 (*)    Indirect Bilirubin 1.4 (*)    All other components within normal limits  TROPONIN I - Abnormal; Notable for the following:    Troponin I 0.08 (*)    All other components within normal limits  LIPASE, BLOOD  AMMONIA   ____________________________________________   EKG  Interpreted by me Sinus rhythm rate of 84, normal axis and intervals. Poor R-wave progression in anterior precordial leads. Normal ST segments and T waves. Large inferior Q-wave consistent with old infarcts.  ____________________________________________    RADIOLOGY  Chest x-ray  shows chronic left effusion and left lower lobe collapse which is unchanged compared to prior chest x-rays. There is some vascular congestion without pulmonary edema.  ____________________________________________   PROCEDURES   ____________________________________________   INITIAL IMPRESSION / ASSESSMENT AND PLAN / ED COURSE  Pertinent labs & imaging results that were available during my care of the patient were  reviewed by me and considered in my medical decision making (see chart for details).  Patient well appearing no acute distress. No significant lower extremity edema. She appears to have chronic volume overload but based on labs and clinical exam today, there is no indication for acute emergent dialysis as the patient requests. She is scheduled for dialysis tomorrow. No other acute findings. She has chronically elevated bilirubin as well as chronically elevated troponin. Her troponin today is actually lower than it usually is. This is most likely due to the heart strain from her chronic volume overload. She also has chronic anemia, likely related to chronic disease. Low suspicion for GI bleeding or other significant blood loss. She does not appear to be coagulopathic at this time.Considering the patient's symptoms, medical history, and physical examination today, I have low suspicion for ACS, PE, TAD, pneumothorax, carditis, mediastinitis, pneumonia, CHF, or sepsis.  The abdominal pain does not appear to be clinically important at this time. I have low suspicion for cholecystitis or biliary pathology, pancreatitis, perforation or bowel obstruction, hernia, intra-abdominal abscess, AAA or dissection, volvulus or intussusception, mesenteric ischemia, or appendicitis.  I counseled the patient on the favorable results that she does not appear to be severely ill today although she definitely has significant chronic illnesses. Encouraged her to see her scheduled dialysis tomorrow and that there is no indication for admission or emergent dialysis today. Patient will call her son to prepare for discharge   ____________________________________________   FINAL CLINICAL IMPRESSION(S) / ED DIAGNOSES  Final diagnoses:  Dyspnea  ESRD on hemodialysis ([place])  Hypertension secondary to other renal disorders       Portions of this note were generated with dragon dictation software. Dictation errors may occur  despite best attempts at proofreading.   Carrie Mew, MD 06/10/15 [phone]

## 2015-06-10 NOTE — ED Notes (Signed)
Triage by Katie, RN.  

## 2015-06-10 NOTE — ED Notes (Signed)
Pt here from home via [facility] with c/o difficulty brething. EMS reports initial O2 sat in high 80s-90s. Pt reports oxygen was turned off last night at home by homecare provider. Pt is dialysis patient, TThS. Pt feels like she has fluid on her.

## 2015-06-10 NOTE — ED Notes (Signed)
Patient transported to x-ray. ?

## 2015-06-10 NOTE — ED Notes (Addendum)
Patient is texting on her phone during assessment. Denies 3rd dose of nitro. States, "It's not going to make any difference."

## 2015-06-10 NOTE — Discharge Instructions (Signed)
Dialysis °Dialysis is a procedure that replaces some of the work healthy kidneys do. It is done when you lose about 85-90% of your kidney function. It may also be done earlier if your symptoms may be improved by dialysis. During dialysis, wastes, salt, and extra water are removed from the blood, and the levels of certain chemicals in the blood (such as potassium) are maintained. Dialysis is done in sessions. Dialysis sessions are continued until the kidneys get better. If the kidneys cannot get better, such as in end-stage kidney disease, dialysis is continued for life or until you receive a new kidney (kidney transplant). There are two types of dialysis: hemodialysis and peritoneal dialysis. °WHAT IS HEMODIALYSIS?  °Hemodialysis is a type of dialysis in which a machine called a dialyzer is used to filter the blood. Before beginning hemodialysis, you will have surgery to create a site where blood can be removed from the body and returned to the body (vascular access). There are three types of vascular accesses: °· Arteriovenous fistula. To create this type of access, an artery is connected to a vein (usually in the arm). A fistula takes 1-6 months to develop after surgery. If it develops properly, it usually lasts longer than the other types of vascular accesses. It is also less likely to become infected and cause blood clots. °· Arteriovenous graft. To create this type of access, an artery and a vein in the arm are connected with a tube. A graft may be used within 2-3 weeks of surgery. °· A venous catheter. To create this type of access, a thin, flexible tube (catheter) is placed in a large vein in your neck, chest, or groin. A catheter may be used right away. It is usually used as a temporary access when dialysis needs to begin immediately. °During hemodialysis, blood leaves the body through your access. It travels through a tube to the dialyzer, where it is filtered. The blood then returns to your body through  another tube. °Hemodialysis is usually performed by a health care provider at a hospital or dialysis center three times a week. Visits last about 3-4 hours. It may also be performed with the help of another person at home with training.  °WHAT IS PERITONEAL DIALYSIS? °Peritoneal dialysis is a type of dialysis in which the thin lining of the abdomen (peritoneum) is used as a filter. Before beginning peritoneal dialysis, you will have surgery to place a catheter in your abdomen. The catheter will be used to transfer a fluid called dialysate to and from your abdomen. At the start of a session, your abdomen is filled with dialysate. During the session, wastes, salt, and extra water in the blood pass through the peritoneum and into the dialysate. The dialysate is drained from the body at the end of the session. The process of filling and draining the dialysate is called an exchange. Exchanges are repeated until you have used up all the dialysate for the day. °Peritoneal dialysis may be performed by you at home or at almost any other location. It is done every day. You may need up to five exchanges a day. The amount of time the dialysate is in your body between exchanges is called a dwell. The dwell depends on the number of exchanges needed and the characteristics of the peritoneum. It usually varies from 1.5-3 hours. You may go about your day normally between exchanges. Alternately, the exchanges may be done at night while you sleep, using a machine called a cycler. °WHICH TYPE   OF DIALYSIS SHOULD I CHOOSE?  Both hemodialysis and peritoneal dialysis have advantages and disadvantages. Talk to your health care provider about which type of dialysis would be best for you. Your lifestyle and preferences should be considered along with your medical condition. In some cases, only one type of dialysis may be an option.  Advantages of hemodialysis  It is done less often than peritoneal dialysis.  Someone else can do the  dialysis for you.  If you go to a dialysis center, your health care provider will be able to recognize any problems right away.  If you go to a dialysis center, you can interact with others who are having dialysis. This can provide you with emotional support. Disadvantages of hemodialysis  Hemodialysis may cause cramps and low blood pressure. It may leave you feeling tired on the days you have the treatment.  If you go to a dialysis center, you will need to make weekly appointments and work around the center's schedule.  You will need to take extra care when traveling. If you go to a dialysis center, you will need to make special arrangements to visit a dialysis center near your destination. If you are having treatments at home, you will need to take the dialyzer with you to your destination.  You will need to avoid more foods than you would need to avoid on peritoneal dialysis. Advantages of peritoneal dialysis  It is less likely than hemodialysis to cause cramps and low blood pressure.  You may do exchanges on your own wherever you are, including when you travel.  You do not need to avoid as many foods as you do on hemodialysis. Disadvantages of peritoneal dialysis  It is done more often than hemodialysis.  Performing peritoneal dialysis requires you to have dexterity of the hands. You must also be able to lift bags.  You will have to learn sterilization techniques. You will need to practice them every day to reduce the risk of infection. WHAT CHANGES WILL I NEED TO MAKE TO MY DIET DURING DIALYSIS? Both hemodialysis and peritoneal dialysis require you to make some changes to your diet. For example, you will need to limit your intake of foods high in the minerals phosphorus and potassium. You will also need to limit your fluid intake. Your dietitian can help you plan meals. A good meal plan can improve your dialysis and your health.  WHAT SHOULD I EXPECT WHEN BEGINNING  DIALYSIS? Adjusting to the dialysis treatment, schedule, and diet can take some time. You may need to stop working and may not be able to do some of the things you normally do. You may feel anxious or depressed when beginning dialysis. Eventually, many people feel better overall because of dialysis. Some people are able to return to work after making some changes, such as reducing work intensity. WHERE CAN I FIND MORE INFORMATION?   [place]: www.kidney.org  American Association of Kidney Patients: BombTimer.gl  [employer]: www.kidneyfund.org   This information is not intended to replace advice given to you by your health care provider. Make sure you discuss any questions you have with your health care provider.   Document Released: 03/28/2002 Document Revised: 01/26/2014 Document Reviewed: 03/01/2012 Elsevier Interactive Patient Education 2016 [employer].  Hypertension Hypertension is another name for high blood pressure. High blood pressure forces your heart to work harder to pump blood. A blood pressure reading has two numbers, which includes a higher number over a lower number (example: 110/72). HOME  CARE   Have your blood pressure rechecked by your doctor.  Only take medicine as told by your doctor. Follow the directions carefully. The medicine does not work as well if you skip doses. Skipping doses also puts you at risk for problems.  Do not smoke.  Monitor your blood pressure at home as told by your doctor. GET HELP IF:  You think you are having a reaction to the medicine you are taking.  You have repeat headaches or feel dizzy.  You have puffiness (swelling) in your ankles.  You have trouble with your vision. GET HELP RIGHT AWAY IF:   You get a very bad headache and are confused.  You feel weak, numb, or faint.  You get chest or belly (abdominal) pain.  You throw up (vomit).  You cannot breathe very well. MAKE SURE YOU:    Understand these instructions.  Will watch your condition.  Will get help right away if you are not doing well or get worse.   This information is not intended to replace advice given to you by your health care provider. Make sure you discuss any questions you have with your health care provider.   Document Released: 06/24/2007 Document Revised: 01/10/2013 Document Reviewed: 10/28/2012 Elsevier Interactive Patient Education 2016 [place] of Breath Shortness of breath means you have trouble breathing. It could also mean that you have a medical problem. You should get immediate medical care for shortness of breath. CAUSES   Not enough oxygen in the air such as with high altitudes or a smoke-filled room.  Certain lung diseases, infections, or problems.  Heart disease or conditions, such as angina or heart failure.  Low red blood cells (anemia).  Poor physical fitness, which can cause shortness of breath when you exercise.  Chest or back injuries or stiffness.  Being overweight.  Smoking.  Anxiety, which can make you feel like you are not getting enough air. DIAGNOSIS  Serious medical problems can often be found during your physical exam. Tests may also be done to determine why you are having shortness of breath. Tests may include:  Chest X-rays.  Lung function tests.  Blood tests.  An electrocardiogram (ECG).  An ambulatory electrocardiogram. An ambulatory ECG records your heartbeat patterns over a 24-hour period.  Exercise testing.  A transthoracic echocardiogram (TTE). During echocardiography, sound waves are used to evaluate how blood flows through your heart.  A transesophageal echocardiogram (TEE).  Imaging scans. Your health care provider may not be able to find a cause for your shortness of breath after your exam. In this case, it is important to have a follow-up exam with your health care provider as directed.  TREATMENT  Treatment for  shortness of breath depends on the cause of your symptoms and can vary greatly. HOME CARE INSTRUCTIONS   Do not smoke. Smoking is a common cause of shortness of breath. If you smoke, ask for help to quit.  Avoid being around chemicals or things that may bother your breathing, such as paint fumes and dust.  Rest as needed. Slowly resume your usual activities.  If medicines were prescribed, take them as directed for the full length of time directed. This includes oxygen and any inhaled medicines.  Keep all follow-up appointments as directed by your health care provider. SEEK MEDICAL CARE IF:   Your condition does not improve in the time expected.  You have a hard time doing your normal activities even with rest.  You have any new  symptoms. SEEK IMMEDIATE MEDICAL CARE IF:   Your shortness of breath gets worse.  You feel light-headed, faint, or develop a cough not controlled with medicines.  You start coughing up blood.  You have pain with breathing.  You have chest pain or pain in your arms, shoulders, or abdomen.  You have a fever.  You are unable to walk up stairs or exercise the way you normally do. MAKE SURE YOU:  Understand these instructions.  Will watch your condition.  Will get help right away if you are not doing well or get worse.   This information is not intended to replace advice given to you by your health care provider. Make sure you discuss any questions you have with your health care provider.   Document Released: 09/30/2000 Document Revised: 01/10/2013 Document Reviewed: 03/23/2011 Elsevier Interactive Patient Education [employer].

## 2015-06-14 ENCOUNTER — Ambulatory Visit: Payer: Medicare Other | Admitting: Pulmonary Disease

## 2015-07-08 ENCOUNTER — Encounter: Payer: Self-pay | Admitting: Pulmonary Disease

## 2015-07-11 ENCOUNTER — Emergency Department
Admission: EM | Admit: 2015-07-11 | Discharge: 2015-07-11 | Disposition: A | Discharge status: Home / Self Care | Payer: Medicare Other | Attending: Emergency Medicine | Admitting: Emergency Medicine

## 2015-07-11 DIAGNOSIS — I132 Hypertensive heart and chronic kidney disease with heart failure and with stage 5 chronic kidney disease, or end stage renal disease: Secondary | ICD-10-CM | POA: Diagnosis not present

## 2015-07-11 DIAGNOSIS — N186 End stage renal disease: Secondary | ICD-10-CM | POA: Diagnosis not present

## 2015-07-11 DIAGNOSIS — Z7902 Long term (current) use of antithrombotics/antiplatelets: Secondary | ICD-10-CM | POA: Diagnosis not present

## 2015-07-11 DIAGNOSIS — I2510 Atherosclerotic heart disease of native coronary artery without angina pectoris: Secondary | ICD-10-CM | POA: Diagnosis not present

## 2015-07-11 DIAGNOSIS — T82838A Hemorrhage of vascular prosthetic devices, implants and grafts, initial encounter: Secondary | ICD-10-CM

## 2015-07-11 DIAGNOSIS — Z79899 Other long term (current) drug therapy: Secondary | ICD-10-CM | POA: Diagnosis not present

## 2015-07-11 DIAGNOSIS — Z8679 Personal history of other diseases of the circulatory system: Secondary | ICD-10-CM | POA: Insufficient documentation

## 2015-07-11 DIAGNOSIS — Z7951 Long term (current) use of inhaled steroids: Secondary | ICD-10-CM | POA: Insufficient documentation

## 2015-07-11 DIAGNOSIS — Z992 Dependence on renal dialysis: Secondary | ICD-10-CM | POA: Insufficient documentation

## 2015-07-11 DIAGNOSIS — N2581 Secondary hyperparathyroidism of renal origin: Secondary | ICD-10-CM | POA: Insufficient documentation

## 2015-07-11 DIAGNOSIS — Y828 Other medical devices associated with adverse incidents: Secondary | ICD-10-CM | POA: Diagnosis not present

## 2015-07-11 DIAGNOSIS — I5023 Acute on chronic systolic (congestive) heart failure: Secondary | ICD-10-CM | POA: Insufficient documentation

## 2015-07-11 DIAGNOSIS — J449 Chronic obstructive pulmonary disease, unspecified: Secondary | ICD-10-CM | POA: Insufficient documentation

## 2015-07-11 NOTE — ED Notes (Signed)
PT awaiting EMS transport home

## 2015-07-11 NOTE — ED Provider Notes (Signed)
[facility] Emergency Department Provider Note  Time seen: 11:51 AM  I have reviewed the triage vital signs and the nursing notes.   HISTORY  Chief Complaint Vascular Access Problem    HPI Kelsey Peterson is a 60 y.o. female with a past medical history of end-stage renal disease on hemodialysis Tuesday, Thursday, Saturday, hypertension, COPD, who presents the emergency department with a bleeding dialysis site. Patient received her full dialysis session today but they cannot get the dialysis access site to stop bleeding so a center to the emergency department. Upon arrival there is a mild ooze from the dialysis insertion site. A fistula clamp was applied to the area. Patient denies any discomfort. Denies any complaints. States I want to go home.     Past Medical History  Diagnosis Date  . ESRD on hemodialysis ([place])   . Secondary hyperparathyroidism of renal origin ([place])   . Hepatitis C   . Migraine with aura   . HOH (hard of hearing)   . Mitral regurgitation     a. calcified mitral annulus, mod MR by echo in 06/2014  . Cognitive impairment   . CAD (coronary artery disease)   . HTN (hypertension)   . Chronic systolic CHF (congestive heart failure) (HCC)     a. EF 45% by echo in 06/2014  . Paroxysmal SVT (supraventricular tachycardia) ([place])     a. history of this, with last known occurrence in 06/2014  . COPD (chronic obstructive pulmonary disease) ([place]) 07/14/2014  . GERD (gastroesophageal reflux disease) 07/14/2014  . Anemia of chronic disease     Patient Active Problem List   Diagnosis Date Noted  . Hypoglycemia 04/18/2015  . Upper GI bleed 04/18/2015  . Elevated troponin 04/18/2015  . ESRD on hemodialysis ([place]) 04/18/2015  . Volume depletion, gastrointestinal loss 04/18/2015  . Uncontrolled hypertension 04/18/2015  . Acute respiratory failure with hypoxia ([place]) 04/02/2015  . Pleural effusion, left 04/02/2015  . Extreme hearing loss 03/26/2015   . Acute on chronic systolic heart failure ([place]) 02/28/2015  . Cholelithiasis 12/01/2014  . Hyperkalemia 09/01/2014  . Fluid overload 09/01/2014  . GERD (gastroesophageal reflux disease) 07/14/2014  . COPD (chronic obstructive pulmonary disease) ([place]) 07/14/2014  . HTN (hypertension) 07/14/2014  . CAD (coronary artery disease) 07/14/2014  . End stage renal disease on dialysis ([place])   . Supraventricular tachycardia ([place])   . NSVT (nonsustained ventricular tachycardia) ([place])   . Anemia 07/03/2014  . Diarrhea     Past Surgical History  Procedure Laterality Date  . Dialysis fistula creation    . Tubal ligation    . Colonoscopy with propofol Left 07/09/2014    Procedure: COLONOSCOPY WITH PROPOFOL;  Surgeon: Hulen Luster, MD;  [facility] ENDOSCOPY;  Service: Endoscopy;  Laterality: Left;  . Colonoscopy N/A 07/15/2014    Procedure: COLONOSCOPY;  Surgeon: Manya Silvas, MD;  Location: [facility] ENDOSCOPY;  Service: Endoscopy;  Laterality: N/A;  . Esophagogastroduodenoscopy N/A 04/22/2015    Procedure: ESOPHAGOGASTRODUODENOSCOPY (EGD);  Surgeon: Manya Silvas, MD;  Location: [facility] ENDOSCOPY;  Service: Endoscopy;  Laterality: N/A;    Current Outpatient Rx  Name  Route  Sig  Dispense  Refill  . albuterol (PROVENTIL HFA;VENTOLIN HFA) 108 (90 Base) MCG/ACT inhaler   Inhalation   Inhale 2 puffs into the lungs every 6 (six) hours as needed for wheezing or shortness of breath.   1 Inhaler   2   . amiodarone (PACERONE) 200 MG tablet   Oral  Take 1 tablet (200 mg total) by mouth daily.         Marland Kitchen amLODipine (NORVASC) 5 MG tablet   Oral   Take 5 mg by mouth daily.         . calcium acetate (PHOSLO) 667 MG capsule   Oral   Take 2 capsules by mouth 3 (three) times daily with meals. And one capsule with snakes         . cinacalcet (SENSIPAR) 60 MG tablet   Oral   Take 60 mg by mouth daily.         . clopidogrel (PLAVIX) 75 MG tablet   Oral   Take 75 mg by mouth daily.          . Fluticasone-Salmeterol (ADVAIR DISKUS) 250-50 MCG/DOSE AEPB   Inhalation   Inhale 1 puff into the lungs 2 (two) times daily.   60 each   0   . folic acid-vitamin b complex-vitamin c-selenium-zinc (DIALYVITE) 3 MG TABS tablet   Oral   Take 1 tablet by mouth daily.         . furosemide (LASIX) 80 MG tablet   Oral   Take 80 mg by mouth daily. Patient takes on non dialysis days.         Marland Kitchen ipratropium-albuterol (DUONEB) 0.5-2.5 (3) MG/3ML SOLN   Nebulization   Take 3 mLs by nebulization 4 (four) times daily as needed. DX: COPD DX Code: J44.9   360 mL   11   . ipratropium-albuterol (DUONEB) 0.5-2.5 (3) MG/3ML SOLN   Nebulization   Take 3 mLs by nebulization every 4 (four) hours as needed.   360 mL   0   . isosorbide mononitrate (IMDUR) 30 MG 24 hr tablet   Oral   Take 30 mg by mouth daily.         . metoprolol tartrate (LOPRESSOR) 25 MG tablet   Oral   Take 12.5 mg by mouth 2 (two) times daily.         . ondansetron (ZOFRAN) 4 MG tablet   Oral   Take 4 mg by mouth every 8 (eight) hours as needed for nausea or vomiting. Reported on 02/05/2015         . pantoprazole (PROTONIX) 40 MG tablet   Oral   Take 1 tablet (40 mg total) by mouth 2 (two) times daily.   60 tablet   0   . sodium polystyrene (KAYEXALATE) 15 GM/60ML suspension   Oral   Take 60 mLs (15 g total) by mouth once.   120 mL   0   . SPIRIVA HANDIHALER 18 MCG inhalation capsule   Oral   Take 1 puff by mouth daily.           Dispense as written.     Allergies Contrast media; Morphine and related; and Other  Family History  Problem Relation Age of Onset  . Heart disease Mother   . Hypertension Mother     Social History Social History  Substance Use Topics  . Smoking status: Never Smoker   . Smokeless tobacco: Never Used  . Alcohol Use: No    Review of Systems Constitutional: Negative for fever. Cardiovascular: Negative for chest pain. Respiratory: Negative for shortness of  breath. Gastrointestinal: Negative for abdominal pain Musculoskeletal: Negative for back pain. Neurological: Negative for headache 10-point ROS otherwise negative.  ____________________________________________   PHYSICAL EXAM:  VITAL SIGNS: ED Triage Vitals  Enc Vitals Group     BP 07/11/15  1138 160/87 mmHg     Pulse --      Resp 07/11/15 1138 16     Temp 07/11/15 1136 98.2 F (36.8 C)     Temp src --      SpO2 07/11/15 1138 100 %     Weight --      Height --      Head Cir --      Peak Flow --      Pain Score --      Pain Loc --      Pain Edu? --      Excl. in [place]? --     Constitutional: Alert and oriented. Well appearing and in no distress. Eyes: Normal exam ENT   Head: Normocephalic and atraumatic.   Mouth/Throat: Mucous membranes are moist. Cardiovascular: Normal rate, regular rhythm. No murmur Respiratory: Normal respiratory effort without tachypnea nor retractions. Breath sounds are clear Gastrointestinal: Soft and nontender. No distention.   Musculoskeletal: Nontender with normal range of motion in all extremities. 2 axis sites in the right upper extremity, minimal oozing from lower access site. Neurologic:  Normal speech and language. No gross focal neurologic deficits Skin:  Skin is warm, dry and intact.  Psychiatric: Mood and affect are normal.   ____________________________________________     INITIAL IMPRESSION / ASSESSMENT AND PLAN / ED COURSE  Pertinent labs & imaging results that were available during my care of the patient were reviewed by me and considered in my medical decision making (see chart for details).  Patient presents the emergency department with a bleeding AV fistula access site. Minimal oozing upon presentation, fistula clamp applied. Reevaluation after 20 minutes of fistula clamp shows no bleeding. I have applied Dermabond, we'll monitor in the emergency department to ensure the bleeding remained stopped. Patient has no  complaints, continuously asks to be discharged home.  AV fistula remains hemostatic. We'll discharge home with PCP follow-up. I discussed return precautions with the patient.  ____________________________________________   FINAL CLINICAL IMPRESSION(S) / ED DIAGNOSES  Bleeding AV fistula   Harvest Dark, MD 07/11/15 1220

## 2015-07-11 NOTE — ED Notes (Addendum)
Pt BIB EM, received dialysis today and has been unable to get access site to stop bleeding. Clamp currently in place. Pt on 2L o2 chronically at home. Pt takes plavix

## 2015-07-29 ENCOUNTER — Encounter: Payer: Self-pay | Admitting: Emergency Medicine

## 2015-07-29 ENCOUNTER — Inpatient Hospital Stay: Admission: RE | Admit: 2015-07-29 | Payer: Medicare Other | Source: Ambulatory Visit

## 2015-07-29 ENCOUNTER — Emergency Department: Payer: Medicare Other

## 2015-07-29 ENCOUNTER — Inpatient Hospital Stay
Admission: EM | Admit: 2015-07-29 | Discharge: 2015-07-31 | DRG: 640 | Disposition: A | Discharge status: Home / Self Care | Payer: Medicare Other | Attending: Internal Medicine | Admitting: Internal Medicine

## 2015-07-29 DIAGNOSIS — Z8249 Family history of ischemic heart disease and other diseases of the circulatory system: Secondary | ICD-10-CM | POA: Diagnosis not present

## 2015-07-29 DIAGNOSIS — Z79899 Other long term (current) drug therapy: Secondary | ICD-10-CM

## 2015-07-29 DIAGNOSIS — Z8661 Personal history of infections of the central nervous system: Secondary | ICD-10-CM

## 2015-07-29 DIAGNOSIS — E875 Hyperkalemia: Principal | ICD-10-CM | POA: Diagnosis present

## 2015-07-29 DIAGNOSIS — E162 Hypoglycemia, unspecified: Secondary | ICD-10-CM | POA: Diagnosis present

## 2015-07-29 DIAGNOSIS — I132 Hypertensive heart and chronic kidney disease with heart failure and with stage 5 chronic kidney disease, or end stage renal disease: Secondary | ICD-10-CM | POA: Diagnosis present

## 2015-07-29 DIAGNOSIS — Z7951 Long term (current) use of inhaled steroids: Secondary | ICD-10-CM | POA: Diagnosis not present

## 2015-07-29 DIAGNOSIS — K219 Gastro-esophageal reflux disease without esophagitis: Secondary | ICD-10-CM | POA: Diagnosis present

## 2015-07-29 DIAGNOSIS — I2510 Atherosclerotic heart disease of native coronary artery without angina pectoris: Secondary | ICD-10-CM | POA: Diagnosis present

## 2015-07-29 DIAGNOSIS — Z992 Dependence on renal dialysis: Secondary | ICD-10-CM

## 2015-07-29 DIAGNOSIS — Z9889 Other specified postprocedural states: Secondary | ICD-10-CM | POA: Diagnosis not present

## 2015-07-29 DIAGNOSIS — I340 Nonrheumatic mitral (valve) insufficiency: Secondary | ICD-10-CM | POA: Diagnosis present

## 2015-07-29 DIAGNOSIS — D631 Anemia in chronic kidney disease: Secondary | ICD-10-CM | POA: Diagnosis present

## 2015-07-29 DIAGNOSIS — N186 End stage renal disease: Secondary | ICD-10-CM | POA: Diagnosis present

## 2015-07-29 DIAGNOSIS — I5022 Chronic systolic (congestive) heart failure: Secondary | ICD-10-CM | POA: Diagnosis present

## 2015-07-29 DIAGNOSIS — Z888 Allergy status to other drugs, medicaments and biological substances status: Secondary | ICD-10-CM | POA: Diagnosis not present

## 2015-07-29 DIAGNOSIS — N2581 Secondary hyperparathyroidism of renal origin: Secondary | ICD-10-CM | POA: Diagnosis present

## 2015-07-29 DIAGNOSIS — Z9851 Tubal ligation status: Secondary | ICD-10-CM | POA: Diagnosis not present

## 2015-07-29 DIAGNOSIS — I471 Supraventricular tachycardia: Secondary | ICD-10-CM | POA: Diagnosis present

## 2015-07-29 DIAGNOSIS — H9190 Unspecified hearing loss, unspecified ear: Secondary | ICD-10-CM | POA: Diagnosis present

## 2015-07-29 DIAGNOSIS — R479 Unspecified speech disturbances: Secondary | ICD-10-CM | POA: Diagnosis present

## 2015-07-29 DIAGNOSIS — Z885 Allergy status to narcotic agent status: Secondary | ICD-10-CM

## 2015-07-29 DIAGNOSIS — J449 Chronic obstructive pulmonary disease, unspecified: Secondary | ICD-10-CM | POA: Diagnosis present

## 2015-07-29 LAB — COMPREHENSIVE METABOLIC PANEL
ALK PHOS: 109 U/L (ref 38–126)
ALT: 25 U/L (ref 14–54)
AST: 61 U/L — AB (ref 15–41)
Albumin: 4 g/dL (ref 3.5–5.0)
Anion gap: 30 — ABNORMAL HIGH (ref 5–15)
BILIRUBIN TOTAL: 2.5 mg/dL — AB (ref 0.3–1.2)
BUN: 53 mg/dL — AB (ref 6–20)
CALCIUM: 9.9 mg/dL (ref 8.9–10.3)
CO2: 14 mmol/L — ABNORMAL LOW (ref 22–32)
CREATININE: 9.1 mg/dL — AB (ref 0.44–1.00)
Chloride: 94 mmol/L — ABNORMAL LOW (ref 101–111)
GFR calc Af Amer: 5 mL/min — ABNORMAL LOW (ref >60)
GFR, EST NON AFRICAN AMERICAN: 4 mL/min — AB (ref >60)
Glucose, Bld: 135 mg/dL — ABNORMAL HIGH (ref 65–99)
Sodium: 138 mmol/L (ref 135–145)
TOTAL PROTEIN: 7.8 g/dL (ref 6.5–8.1)

## 2015-07-29 LAB — GLUCOSE, CAPILLARY
GLUCOSE-CAPILLARY: 50 mg/dL — AB (ref 65–99)
Glucose-Capillary: 51 mg/dL — ABNORMAL LOW (ref 65–99)
Glucose-Capillary: <10 mg/dL — CL (ref 65–99)
Glucose-Capillary: 124 mg/dL — ABNORMAL HIGH (ref 65–99)
Glucose-Capillary: 141 mg/dL — ABNORMAL HIGH (ref 65–99)
Glucose-Capillary: 110 mg/dL — ABNORMAL HIGH (ref 65–99)
Glucose-Capillary: 91 mg/dL (ref 65–99)

## 2015-07-29 LAB — CBC
HCT: 23.1 % — ABNORMAL LOW (ref 35.0–47.0)
Hemoglobin: 7.6 g/dL — ABNORMAL LOW (ref 12.0–16.0)
MCH: 25.9 pg — AB (ref 26.0–34.0)
MCHC: 32.9 g/dL (ref 32.0–36.0)
MCV: 78.9 fL — AB (ref 80.0–100.0)
PLATELETS: 299 10*3/uL (ref 150–440)
RBC: 2.93 MIL/uL — AB (ref 3.80–5.20)
RDW: 21.7 % — ABNORMAL HIGH (ref 11.5–14.5)
WBC: 6.9 10*3/uL (ref 3.6–11.0)

## 2015-07-29 LAB — TROPONIN I: TROPONIN I: 0.08 ng/mL — AB (ref <0.03)

## 2015-07-29 LAB — MRSA PCR SCREENING: MRSA BY PCR: POSITIVE — AB

## 2015-07-29 LAB — LIPASE, BLOOD: Lipase: 18 U/L (ref 11–51)

## 2015-07-29 MED ORDER — ONDANSETRON HCL 4 MG/2ML IJ SOLN
4.0000 mg | Freq: Once | INTRAMUSCULAR | Status: AC
  Administered 2015-07-29: 4 mg via INTRAVENOUS
  Filled 2015-07-29: qty 2

## 2015-07-29 MED ORDER — HEPARIN SODIUM (PORCINE) 5000 UNIT/ML IJ SOLN
5000.0000 [IU] | Freq: Three times a day (TID) | INTRAMUSCULAR | Status: DC
  Filled 2015-07-29 (×2): qty 1

## 2015-07-29 MED ORDER — TIOTROPIUM BROMIDE MONOHYDRATE 18 MCG IN CAPS
18.0000 ug | ORAL_CAPSULE | Freq: Every day | RESPIRATORY_TRACT | Status: DC
  Administered 2015-07-31: 18 ug via RESPIRATORY_TRACT
  Filled 2015-07-29: qty 5

## 2015-07-29 MED ORDER — ONDANSETRON HCL 4 MG/2ML IJ SOLN
4.0000 mg | Freq: Four times a day (QID) | INTRAMUSCULAR | Status: DC | PRN
  Administered 2015-07-29 – 2015-07-30 (×2): 4 mg via INTRAVENOUS
  Filled 2015-07-29: qty 2

## 2015-07-29 MED ORDER — ACETAMINOPHEN 650 MG RE SUPP
650.0000 mg | Freq: Four times a day (QID) | RECTAL | Status: DC | PRN
Start: 2015-07-29 — End: 2015-07-31

## 2015-07-29 MED ORDER — CALCIUM GLUCONATE 10 % IV SOLN
INTRAVENOUS | Status: AC
  Filled 2015-07-29: qty 10

## 2015-07-29 MED ORDER — ALBUTEROL SULFATE (2.5 MG/3ML) 0.083% IN NEBU
5.0000 mg | INHALATION_SOLUTION | Freq: Once | RESPIRATORY_TRACT | Status: AC
  Administered 2015-07-29: 5 mg via RESPIRATORY_TRACT
  Filled 2015-07-29: qty 6

## 2015-07-29 MED ORDER — SODIUM POLYSTYRENE SULFONATE 15 GM/60ML PO SUSP: 30.0000 g | Freq: Once | ORAL | Status: DC

## 2015-07-29 MED ORDER — PANTOPRAZOLE SODIUM 40 MG PO TBEC
40.0000 mg | DELAYED_RELEASE_TABLET | Freq: Two times a day (BID) | ORAL | Status: DC
  Administered 2015-07-29 – 2015-07-31 (×3): 40 mg via ORAL
  Filled 2015-07-29 (×3): qty 1

## 2015-07-29 MED ORDER — CINACALCET HCL 30 MG PO TABS
60.0000 mg | ORAL_TABLET | Freq: Every day | ORAL | Status: DC
  Administered 2015-07-31: 60 mg via ORAL
  Filled 2015-07-29 (×2): qty 2

## 2015-07-29 MED ORDER — SODIUM CHLORIDE 0.9 % IV BOLUS (SEPSIS): 1000.0000 mL | Freq: Once | INTRAVENOUS | Status: DC

## 2015-07-29 MED ORDER — CLOPIDOGREL BISULFATE 75 MG PO TABS
75.0000 mg | ORAL_TABLET | Freq: Every day | ORAL | Status: DC
  Administered 2015-07-31: 75 mg via ORAL
  Filled 2015-07-29: qty 1

## 2015-07-29 MED ORDER — SODIUM BICARBONATE 8.4 % IV SOLN: 50.0000 meq | Freq: Once | INTRAVENOUS | Status: DC

## 2015-07-29 MED ORDER — RENA-VITE PO TABS: 1.0000 | ORAL_TABLET | Freq: Every day | ORAL | Status: DC

## 2015-07-29 MED ORDER — HYDRALAZINE HCL 20 MG/ML IJ SOLN: 10.0000 mg | INTRAMUSCULAR | Status: DC | PRN

## 2015-07-29 MED ORDER — ADULT MULTIVITAMIN W/MINERALS CH
1.0000 | ORAL_TABLET | Freq: Every day | ORAL | Status: DC
  Administered 2015-07-31: 1 via ORAL
  Filled 2015-07-29: qty 1

## 2015-07-29 MED ORDER — AMLODIPINE BESYLATE 5 MG PO TABS
5.0000 mg | ORAL_TABLET | Freq: Every day | ORAL | Status: DC
  Administered 2015-07-31: 5 mg via ORAL
  Filled 2015-07-29: qty 1

## 2015-07-29 MED ORDER — CALCIUM ACETATE (PHOS BINDER) 667 MG PO CAPS: 1334.0000 mg | ORAL_CAPSULE | Freq: Three times a day (TID) | ORAL | Status: DC

## 2015-07-29 MED ORDER — ACETAMINOPHEN 325 MG PO TABS: 650.0000 mg | ORAL_TABLET | Freq: Four times a day (QID) | ORAL | Status: DC | PRN

## 2015-07-29 MED ORDER — ONDANSETRON HCL 4 MG PO TABS: 4.0000 mg | ORAL_TABLET | Freq: Four times a day (QID) | ORAL | Status: DC | PRN

## 2015-07-29 MED ORDER — DIALYVITE 3000 3 MG PO TABS: 1.0000 | ORAL_TABLET | Freq: Every day | ORAL | Status: DC

## 2015-07-29 MED ORDER — SODIUM CHLORIDE 0.9% FLUSH
3.0000 mL | Freq: Two times a day (BID) | INTRAVENOUS | Status: DC
  Administered 2015-07-29: 3 mL via INTRAVENOUS

## 2015-07-29 MED ORDER — AMIODARONE HCL 200 MG PO TABS
200.0000 mg | ORAL_TABLET | Freq: Every day | ORAL | Status: DC
  Administered 2015-07-29 – 2015-07-31 (×2): 200 mg via ORAL
  Filled 2015-07-29 (×2): qty 1

## 2015-07-29 MED ORDER — MOMETASONE FURO-FORMOTEROL FUM 200-5 MCG/ACT IN AERO
2.0000 | INHALATION_SPRAY | Freq: Two times a day (BID) | RESPIRATORY_TRACT | Status: DC
  Administered 2015-07-29 – 2015-07-31 (×3): 2 via RESPIRATORY_TRACT
  Filled 2015-07-29: qty 8.8

## 2015-07-29 MED ORDER — IPRATROPIUM-ALBUTEROL 0.5-2.5 (3) MG/3ML IN SOLN: 3.0000 mL | RESPIRATORY_TRACT | Status: DC | PRN

## 2015-07-29 MED ORDER — ISOSORBIDE MONONITRATE ER 30 MG PO TB24
30.0000 mg | ORAL_TABLET | Freq: Every day | ORAL | Status: DC
Start: 2015-07-29 — End: 2015-07-31
  Administered 2015-07-29 – 2015-07-31 (×2): 30 mg via ORAL
  Filled 2015-07-29 (×2): qty 1

## 2015-07-29 MED ORDER — METOPROLOL TARTRATE 25 MG PO TABS
12.5000 mg | ORAL_TABLET | Freq: Two times a day (BID) | ORAL | Status: DC
  Administered 2015-07-29 – 2015-07-31 (×3): 12.5 mg via ORAL
  Filled 2015-07-29 (×3): qty 1

## 2015-07-29 MED ORDER — DEXTROSE 50 % IV SOLN
50.0000 mL | Freq: Once | INTRAVENOUS | Status: AC
  Administered 2015-07-29: 50 mL via INTRAVENOUS
  Filled 2015-07-29: qty 50

## 2015-07-29 MED ORDER — SODIUM CHLORIDE 0.9 % IV SOLN
1.0000 g | Freq: Once | INTRAVENOUS | Status: AC
  Administered 2015-07-29: 1 g via INTRAVENOUS
  Filled 2015-07-29: qty 10

## 2015-07-29 NOTE — ED Provider Notes (Signed)
[facility] Emergency Department Provider Note  ____________________________________________  Time seen: Approximately 2:22 PM  I have reviewed the triage vital signs and the nursing notes.   HISTORY  Chief Complaint Abdominal Pain; Nausea; and Emesis   HPI Kelsey Peterson is a 60 y.o. female with a past medical history of end-stage renal disease on hemodialysis Tuesday, Thursday, Saturday, hypertension, COPD, hypoglycemia who presents to the emergency department for evaluation of fatigue and weakness. Patient reports that she has been feeling very fatigued since 7/4. She reports that her doctor called her last week and told her to come to [facility] ER for a blood transfusion as her blood count was very low however she reports that she's been so fatigued that she hasn't been able to come to the hospital. Her last dialysis was on Thursday. She missed dialysis on Saturday because of her fatigue. She denies fever, cough, chest pain, abdominal pain, diarrhea, melena, dysuria, cough. She does endorse chronic shortness of breath although that has been worse since missing HD. She denies abdominal pain but reports multiple episodes of NBNB emesis and nausea for the past week. Also reports anorexia however reports that she is still eating and drinking. History is limited as patient is deaf and can only answer to questions that are written down her pad.   Past Medical History  Diagnosis Date  . ESRD on hemodialysis ([place])   . Secondary hyperparathyroidism of renal origin ([place])   . Hepatitis C   . Migraine with aura   . HOH (hard of hearing)   . Mitral regurgitation     a. calcified mitral annulus, mod MR by echo in 06/2014  . Cognitive impairment   . CAD (coronary artery disease)   . HTN (hypertension)   . Chronic systolic CHF (congestive heart failure) (HCC)     a. EF 45% by echo in 06/2014  . Paroxysmal SVT (supraventricular tachycardia) ([place])     a. history of this,  with last known occurrence in 06/2014  . COPD (chronic obstructive pulmonary disease) ([place]) 07/14/2014  . GERD (gastroesophageal reflux disease) 07/14/2014  . Anemia of chronic disease     Patient Active Problem List   Diagnosis Date Noted  . Hypoglycemia 04/18/2015  . Elevated troponin 04/18/2015  . ESRD on hemodialysis ([place]) 04/18/2015  . Uncontrolled hypertension 04/18/2015  . Acute respiratory failure with hypoxia ([place]) 04/02/2015  . Pleural effusion, left 04/02/2015  . Extreme hearing loss 03/26/2015  . Acute on chronic systolic heart failure ([place]) 02/28/2015  . Cholelithiasis 12/01/2014  . Hyperkalemia 09/01/2014  . Fluid overload 09/01/2014  . GERD (gastroesophageal reflux disease) 07/14/2014  . COPD (chronic obstructive pulmonary disease) ([place]) 07/14/2014  . HTN (hypertension) 07/14/2014  . CAD (coronary artery disease) 07/14/2014  . End stage renal disease on dialysis ([place])   . Supraventricular tachycardia ([place])   . NSVT (nonsustained ventricular tachycardia) ([place])   . Anemia 07/03/2014    Past Surgical History  Procedure Laterality Date  . Dialysis fistula creation    . Tubal ligation    . Colonoscopy with propofol Left 07/09/2014    Procedure: COLONOSCOPY WITH PROPOFOL;  Surgeon: Hulen Luster, MD;  Location: [facility] ENDOSCOPY;  Service: Endoscopy;  Laterality: Left;  . Colonoscopy N/A 07/15/2014    Procedure: COLONOSCOPY;  Surgeon: Manya Silvas, MD;  Location: [facility] ENDOSCOPY;  Service: Endoscopy;  Laterality: N/A;  . Esophagogastroduodenoscopy N/A 04/22/2015    Procedure: ESOPHAGOGASTRODUODENOSCOPY (EGD);  Surgeon: Manya Silvas, MD;  Location:  [place] ENDOSCOPY;  Service: Endoscopy;  Laterality: N/A;    No current outpatient prescriptions on file.  Allergies Contrast media; Morphine and related; and Other  Family History  Problem Relation Age of Onset  . Heart disease Mother   . Hypertension Mother     Social History Social History  Substance Use Topics  .  Smoking status: Never Smoker   . Smokeless tobacco: Never Used  . Alcohol Use: No    Review of Systems Constitutional: Negative for fever. + generalized fatigue and weakness Eyes: Negative for visual changes. ENT: Negative for sore throat. Cardiovascular: Negative for chest pain. Respiratory: Negative for shortness of breath. Gastrointestinal: Negative for abdominal pain,  Diarrhea. + N/V, anorexia Genitourinary: Negative for dysuria. Musculoskeletal: Negative for back pain. Skin: Negative for rash. Neurological: Negative for headaches, weakness or numbness.  ____________________________________________   PHYSICAL EXAM:  VITAL SIGNS: ED Triage Vitals  Enc Vitals Group     BP 07/29/15 1259 204/116 mmHg     Pulse Rate 07/29/15 1259 93     Resp 07/29/15 1259 22     Temp --      Temp Source 07/29/15 1259 Oral     SpO2 07/29/15 1259 98 %     Weight 07/29/15 1259 175 lb (79.379 kg)     Height 07/29/15 1259 5\' 4"  (1.626 m)     Head Cir --      Peak Flow --      Pain Score 07/29/15 1331 10     Pain Loc --      Pain Edu? --      Excl. in [place]? --     Constitutional: Alert and oriented, tachypneic, mild distress. HEENT:      Head: Normocephalic and atraumatic.         Eyes: Conjunctivae are normal. Sclera is non-icteric. EOMI. PERRL      Mouth/Throat: Mucous membranes are moist.       Neck: Supple with no signs of meningismus. Cardiovascular: Regular rate and rhythm. No murmurs, gallops, or rubs. 2+ symmetrical distal pulses are present in all extremities. No JVD. Respiratory: Tachypneic, maintaining sats on 3L [place] (baseline). Per son her increased WOB is patient's baseline. No crackles or wheezing.  Gastrointestinal: Soft, non tender, and non distended with positive bowel sounds. No rebound or guarding. Musculoskeletal: Nontender with normal range of motion in all extremities. No edema, cyanosis, or erythema of extremities. Neurologic: Normal speech and language. Face is  symmetric. Moving all extremities. No gross focal neurologic deficits are appreciated. Skin: Skin is warm, dry and intact. No rash noted. Psychiatric: Mood and affect are normal. Speech and behavior are normal.  ____________________________________________   LABS (all labs ordered are listed, but only abnormal results are displayed)  Labs Reviewed  MRSA PCR SCREENING - Abnormal; Notable for the following:    MRSA by PCR POSITIVE (*)    All other components within normal limits  CBC - Abnormal; Notable for the following:    RBC 2.93 (*)    Hemoglobin 7.6 (*)    HCT 23.1 (*)    MCV 78.9 (*)    MCH 25.9 (*)    RDW 21.7 (*)    All other components within normal limits  GLUCOSE, CAPILLARY - Abnormal; Notable for the following:    Glucose-Capillary <10 (*)    All other components within normal limits  COMPREHENSIVE METABOLIC PANEL - Abnormal; Notable for the following:    Potassium >7.5 (*)    Chloride 94 (*)  CO2 14 (*)    Glucose, Bld 135 (*)    BUN 53 (*)    Creatinine, Ser 9.10 (*)    AST 61 (*)    Total Bilirubin 2.5 (*)    GFR calc non Af Amer 4 (*)    GFR calc Af Amer 5 (*)    Anion gap 30 (*)    All other components within normal limits  TROPONIN I - Abnormal; Notable for the following:    Troponin I 0.08 (*)    All other components within normal limits  GLUCOSE, CAPILLARY - Abnormal; Notable for the following:    Glucose-Capillary 51 (*)    All other components within normal limits  GLUCOSE, CAPILLARY - Abnormal; Notable for the following:    Glucose-Capillary 50 (*)    All other components within normal limits  GLUCOSE, CAPILLARY - Abnormal; Notable for the following:    Glucose-Capillary 124 (*)    All other components within normal limits  GLUCOSE, CAPILLARY - Abnormal; Notable for the following:    Glucose-Capillary 141 (*)    All other components within normal limits  GLUCOSE, CAPILLARY - Abnormal; Notable for the following:    Glucose-Capillary 110 (*)     All other components within normal limits  LIPASE, BLOOD  GLUCOSE, CAPILLARY  GLUCOSE, CAPILLARY  BASIC METABOLIC PANEL  CBC   ____________________________________________  EKG  ED ECG REPORT I, Rudene Re, the attending physician, personally viewed and interpreted this ECG.  Junctional rhythm, rate 91, normal QRS/ QTC, normal axis, no STE or depressions.   ____________________________________________  RADIOLOGY  CXR; cardiomegaly, mild pulmonary edema and chronic L pleural effusion ____________________________________________   PROCEDURES  Procedure(s) performed: None Critical Care performed: yes  CRITICAL CARE Performed by: Rudene Re  ?  Total critical care time: 35 min  Critical care time was exclusive of separately billable procedures and treating other patients.  Critical care was necessary to treat or prevent imminent or life-threatening deterioration.  Critical care was time spent personally by me on the following activities: development of treatment plan with patient and/or surrogate as well as nursing, discussions with consultants, evaluation of patient's response to treatment, examination of patient, obtaining history from patient or surrogate, ordering and performing treatments and interventions, ordering and review of laboratory studies, ordering and review of radiographic studies, pulse oximetry and re-evaluation of patient's condition.  ____________________________________________   INITIAL IMPRESSION / ASSESSMENT AND PLAN / ED COURSE  60 y.o. female with a past medical history of end-stage renal disease on hemodialysis Tuesday, Thursday, Saturday, hypertension, COPD, hypoglycemia who presents the emergency department for evaluation of fatigue, N/V x 1 week. Patient is dyspneic on exam, to the mid 20s, maintaining her sats on 3 L nasal cannula which is her baseline, blood pressure elevated 204/116, lungs are clear to auscultation with no  wheezing, Abdomen is soft and nontender. Initial labs showing patient is hypoglycemic. Review of Epic shows the patient has had episodes of hypoglycemia in the past with unclear etiology at this time. This could be contributing to her symptoms. Her son is at the bedside and reports the patient does eat at home. The patient's hemoglobin here is at her baseline of 7.6 so unclear why she was receiving transfusion at [facility]. CMP still pending, troponin 0.08 which is patient's baseline.  ----------------------------------------- 4:23 PM on 07/29/2015 -----------------------------------------  Patient's blood glucose remains in the 50s after 1 amp of D50 and orange juice. Her CMP still pending, call the lab, working on it.  We'll give her another amp of D50. Plan to admit patient was CMP is back for hypoglycemia and dialysis. No evidence of hyperkalemia on EKG  _________________________ 5:41 PM on 07/29/2015 -----------------------------------------  After multiple attempts to get a CMP, it finally has a resulting from the lab with a potassium of 7.5. Patient has no EKG changes of hyperkalemia. Her glucose has now improved after 2 Amps of D50 and PO orange juice. We'll discuss with nephrology for dialysis versus treatment of hyperkalemia if they're unable to dialyze patient this evening.  _________________________ 5:45 PM on 07/29/2015 -----------------------------------------  Discussed with Dr. Cyndia Diver, nephrology who recommended calcium gluconate and albuterol. Hold off insulin as patient has had severe hypoglycemia requiring multiple rounds of D50 at this time. We'll admit to the hospitalist service.   Pertinent labs & imaging results that were available during my care of the patient were reviewed by me and considered in my medical decision making (see chart for details).    ____________________________________________   FINAL CLINICAL IMPRESSION(S) / ED DIAGNOSES  Final diagnoses:    Hyperkalemia  Hypoglycemia      NEW MEDICATIONS STARTED DURING THIS VISIT:  Current Discharge Medication List       Note:  This document was prepared using Dragon voice recognition software and may include unintentional dictation errors.   Rudene Re, MD 07/30/15 [phone]

## 2015-07-29 NOTE — ED Notes (Signed)
Pt keeps asking for ice.  Will wait until md decides.  She says she feels jittery as well.  Pt sits in fowlers and appears sob.

## 2015-07-29 NOTE — H&P (Signed)
[facility] [place] NAME: Kelsey Peterson    MR#:  [id number]  DATE OF BIRTH:  1955-10-05   DATE OF ADMISSION:  07/29/2015  PRIMARY CARE PHYSICIAN: No PCP Per Patient   REQUESTING/REFERRING PHYSICIAN: veronese  CHIEF COMPLAINT:   Weakness  HISTORY OF PRESENT ILLNESS:  Kelsey Peterson  is a 60 y.o. female with a known history of End-stage renal disease on hemodialysis who is presenting with weakness. Apparently she was scheduled to see one of her regular doctors today but she did not have any way to get there. Her family went to see her and found that she was extremely weak and fatigued. She was so weak that she skips her most recent dialysis session on Saturday (3 days ago) . She was found to also be hypoglycemic glucose in the 20s potassium greater than 7 Emergency department course ER staff discussed case with nephrology or planning for dialysis Patient is a rather poor historian history obtained from family members present at bedside  PAST MEDICAL HISTORY:   Past Medical History  Diagnosis Date  . ESRD on hemodialysis ([place])   . Secondary hyperparathyroidism of renal origin ([place])   . Hepatitis C   . Migraine with aura   . HOH (hard of hearing)   . Mitral regurgitation     a. calcified mitral annulus, mod MR by echo in 06/2014  . Cognitive impairment   . CAD (coronary artery disease)   . HTN (hypertension)   . Chronic systolic CHF (congestive heart failure) ([facility])     a. EF 45% by echo in 06/2014  . Paroxysmal SVT (supraventricular tachycardia) ([place])     a. history of this, with last known occurrence in 06/2014  . COPD (chronic obstructive pulmonary disease) ([place]) 07/14/2014  . GERD (gastroesophageal reflux disease) 07/14/2014  . Anemia of chronic disease     PAST SURGICAL HISTORY:   Past Surgical History  Procedure Laterality Date  . Dialysis fistula creation    . Tubal ligation    . Colonoscopy with propofol Left  07/09/2014    Procedure: COLONOSCOPY WITH PROPOFOL;  Surgeon: Hulen Luster, MD;  Location: [facility] END[facility]COPY;  Service: Endoscopy;  Laterality: Left;  . Colonoscopy N/A 07/15/2014    Procedure: COLONOSCOPY;  Surgeon: Manya Silvas, MD;  Location: [facility] ENDOSCOPY;  Service: Endoscopy;  Laterality: N/A;  . Esophagogastroduodenoscopy N/A 04/22/2015    Procedure: ESOPHAGOGASTRODUODENOSCOPY (EGD);  Surgeon: Manya Silvas, MD;  Location: [facility] ENDOSCOPY;  Service: Endoscopy;  Laterality: N/A;    SOCIAL HISTORY:   Social History  Substance Use Topics  . Smoking status: Never Smoker   . Smokeless tobacco: Never Used  . Alcohol Use: No    FAMILY HISTORY:   Family History  Problem Relation Age of Onset  . Heart disease Mother   . Hypertension Mother     DRUG ALLERGIES:   Allergies  Allergen Reactions  . Contrast Media [Iodinated Diagnostic Agents] Other (See Comments)    Unknown reaction  . Morphine And Related Hives  . Other Rash    Blood Pressure Pill (Pt doesn't remember name)     REVIEW OF SYSTEMS:  Unable to obtain given patient's mental status medical condition   MEDICATIONS AT HOME:   Prior to Admission medications   Medication Sig Start Date End Date Taking? Authorizing Provider  albuterol (PROVENTIL HFA;VENTOLIN HFA) 108 (90 Base) MCG/ACT inhaler Inhale 2 puffs into the lungs every 6 (six) hours as needed  for wheezing or shortness of breath. 04/04/15  Yes Srikar Sudini, MD  amiodarone (PACERONE) 200 MG tablet Take 1 tablet (200 mg total) by mouth daily. 04/04/15  Yes Srikar Sudini, MD  amLODipine (NORVASC) 5 MG tablet Take 5 mg by mouth daily.   Yes Historical Provider, MD  calcium acetate (PHOSLO) 667 MG capsule Take 2 capsules by mouth 3 (three) times daily with meals. And one capsule with snakes 10/05/14  Yes Historical Provider, MD  cinacalcet (SENSIPAR) 60 MG tablet Take 60 mg by mouth daily.   Yes Historical Provider, MD  clopidogrel (PLAVIX) 75 MG tablet Take 75 mg by  mouth daily. 08/31/14  Yes Historical Provider, MD  Fluticasone-Salmeterol (ADVAIR DISKUS) 250-50 MCG/DOSE AEPB Inhale 1 puff into the lungs 2 (two) times daily. 04/04/15  Yes Srikar Sudini, MD  folic acid-vitamin b complex-vitamin c-selenium-zinc (DIALYVITE) 3 MG TABS tablet Take 1 tablet by mouth daily.   Yes Historical Provider, MD  furosemide (LASIX) 80 MG tablet Take 80 mg by mouth daily. Patient takes on non dialysis days.   Yes Historical Provider, MD  ipratropium-albuterol (DUONEB) 0.5-2.5 (3) MG/3ML SOLN Take 3 mLs by nebulization 4 (four) times daily as needed. DX: COPD DX Code: J44.9 04/16/15  Yes Wilhelmina Mcardle, MD  ipratropium-albuterol (DUONEB) 0.5-2.5 (3) MG/3ML SOLN Take 3 mLs by nebulization every 4 (four) hours as needed. 04/23/15  Yes Vaughan Basta, MD  isosorbide mononitrate (IMDUR) 30 MG 24 hr tablet Take 30 mg by mouth daily.   Yes Historical Provider, MD  metoprolol tartrate (LOPRESSOR) 25 MG tablet Take 12.5 mg by mouth 2 (two) times daily. 10/21/14  Yes Historical Provider, MD  ondansetron (ZOFRAN) 4 MG tablet Take 4 mg by mouth every 8 (eight) hours as needed for nausea or vomiting. Reported on 02/05/2015   Yes Historical Provider, MD  pantoprazole (PROTONIX) 40 MG tablet Take 1 tablet (40 mg total) by mouth 2 (two) times daily. 04/23/15  Yes Vaughan Basta, MD  sodium polystyrene (KAYEXALATE) 15 GM/60ML suspension Take 60 mLs (15 g total) by mouth once. 05/13/15  Yes Loney Hering, MD  SPIRIVA HANDIHALER 18 MCG inhalation capsule Take 1 puff by mouth daily. 05/02/15  Yes Historical Provider, MD      VITAL SIGNS:  Blood pressure 201/121, pulse 98, resp. rate 47, height 5\' 4"  (D34-534 m), weight 175 lb (79.379 kg), SpO2 98 %.  PHYSICAL EXAMINATION:   VITAL SIGNS: Filed Vitals:   07/29/15 1330 07/29/15 1400  BP: 193/111 201/121  Pulse: 97 98  Resp:  74   GENERAL:60 y.o.female moderate distress given mental status.  HEAD: Normocephalic, atraumatic.  EYES:  Pupils equal, round, reactive to light. Unable to assess extraocular muscles given mental status/medical condition. No scleral icterus.  MOUTH: Moist mucosal membrane. Dentition intact. No abscess noted.  EAR, NOSE, THROAT: Clear without exudates. No external lesions.  NECK: Supple. No thyromegaly. No nodules. No JVD.  PULMONARY:Scant basilar rhonchi without wheeze  No use of accessory muscles, Good respiratory effort. good air entry bilaterally CHEST: Nontender to palpation.  CARDIOVASCULAR: S1 and S2. Regular rate and rhythm. No murmurs, rubs, or gallops. No edema. Pedal pulses 2+ bilaterally.  GASTROINTESTINAL: Soft, nontender, nondistended. No masses. Positive bowel sounds. No hepatosplenomegaly.  MUSCULOSKELETAL: No swelling, clubbing, or edema. Range of motion full in all extremities.  NEUROLOGIC: Unable to assess given mental status/medical condition SKIN: No ulceration, lesions, rashes, or cyanosis. Skin warm and dry. Turgor intact.  PSYCHIATRIC: Unable to assess given mental status/medical condition  LABORATORY PANEL:   CBC  Recent Labs Lab 07/29/15 1312  WBC 6.9  HGB 7.6*  HCT 23.1*  PLT 299   ------------------------------------------------------------------------------------------------------------------  Chemistries   Recent Labs Lab 07/29/15 1631  NA 138  K >7.5*  CL 94*  CO2 14*  GLUCOSE 135*  BUN 53*  CREATININE 9.10*  CALCIUM 9.9  AST 61*  ALT 25  ALKPHOS 109  BILITOT 2.5*   ------------------------------------------------------------------------------------------------------------------  Cardiac Enzymes  Recent Labs Lab 07/29/15 1631  TROPONINI 0.08*   ------------------------------------------------------------------------------------------------------------------  RADIOLOGY:  Dg Chest Portable 1 View  07/29/2015  CLINICAL DATA:  Abdominal pain. Emesis and nausea. Shortness of breath and fatigue it. EXAM: PORTABLE CHEST 1 VIEW  COMPARISON:  Two-view chest x-ray 06/10/2015. FINDINGS: The heart is enlarged. Moderate pulmonary vascular congestion mild generalized edema is present. A chronic left pleural effusion is again noted. Associated airspace disease is similar to the prior exam. Rightward curvature of the mid thoracic spine is noted. The patient is rotated to the left. The visualized soft tissues and bony thorax are unremarkable. IMPRESSION: 1. Cardiomegaly and mild edema compatible with congestive heart failure. Edema is somewhat worse than on the prior exam. 2. Chronic left pleural effusion and associated airspace disease. Electronically Signed   By: San Morelle M.D.   On: 07/29/2015 15:14    EKG:   Orders placed or performed during the hospital encounter of 07/29/15  . EKG 12-Lead  . EKG 12-Lead  . ED EKG  . ED EKG    IMPRESSION AND PLAN:   60 year old African-American female history end-stage renal disease on dialysis presenting with weakness found to have remarkably elevated potassium.  1. Hyperkalemia: Case discussed with nephrology plan for urgent hemodialysis in the interim calcium bicarbonate albuterol Kayexalate follow potassium level Place on telemetry 2. Hypoglycemia: Secondary likely to poor oral intake: Glucose currently stabilized after receiving 2 A of D50, follow Accu-Chek every 2 hours until she remained stable 3. End-stage renal disease hemodialysis: Tuesday Thursday Saturday, nephrology for dialysis today as above   4. Essential hypertension: Norvasc, Lopressor, imdur 5. GERD without esophagitis PPI therapy 5. Venous thromboembolism prophylactic: Heparin    All the records are reviewed and case discussed with ED provider. Management plans discussed with the patient, family and they are in agreement.  CODE STATUS: Full  TOTAL TIME TAKING CARE OF THIS PATIENT: 45 critical care minutes.    Hower,  Karenann Cai.D on 07/29/2015 at 6:25 PM  Between 7am to 6pm - Pager -  [phone]  After 6pm: House Pager: - [phone]  [facility]  Office  [phone]  CC: Primary care physician; No PCP Per Patient

## 2015-07-29 NOTE — ED Notes (Addendum)
Assessment completed  Pt with right arm AV fistula present  She cannot tell me the last time she had HD  Pt with chronic o2 via [place]  2L  Pt reports that she has had nausea and shortness of breath since July 4th  10/10 all over pain present

## 2015-07-29 NOTE — ED Notes (Signed)
Pt with abdominal pain, emesis, nausea since July 4. Pt unable to answer questions, states she can't. She periodically calls out for help but doesn't answer questions.

## 2015-07-30 LAB — CBC
HCT: 21.8 % — ABNORMAL LOW (ref 35.0–47.0)
HEMATOCRIT: 21.8 % — AB (ref 35.0–47.0)
Hemoglobin: 7.1 g/dL — ABNORMAL LOW (ref 12.0–16.0)
Hemoglobin: 7.3 g/dL — ABNORMAL LOW (ref 12.0–16.0)
MCH: 25.5 pg — ABNORMAL LOW (ref 26.0–34.0)
MCH: 25.9 pg — ABNORMAL LOW (ref 26.0–34.0)
MCHC: 32.6 g/dL (ref 32.0–36.0)
MCHC: 33.5 g/dL (ref 32.0–36.0)
MCV: 78.2 fL — AB (ref 80.0–100.0)
MCV: 77.4 fL — AB (ref 80.0–100.0)
PLATELETS: 284 10*3/uL (ref 150–440)
PLATELETS: 250 10*3/uL (ref 150–440)
RBC: 2.79 MIL/uL — AB (ref 3.80–5.20)
RBC: 2.82 MIL/uL — ABNORMAL LOW (ref 3.80–5.20)
RDW: 21 % — ABNORMAL HIGH (ref 11.5–14.5)
RDW: 21.4 % — AB (ref 11.5–14.5)
WBC: 9.5 10*3/uL (ref 3.6–11.0)
WBC: 8.4 10*3/uL (ref 3.6–11.0)

## 2015-07-30 LAB — GLUCOSE, CAPILLARY
GLUCOSE-CAPILLARY: 69 mg/dL (ref 65–99)
GLUCOSE-CAPILLARY: 84 mg/dL (ref 65–99)
GLUCOSE-CAPILLARY: 85 mg/dL (ref 65–99)
GLUCOSE-CAPILLARY: 69 mg/dL (ref 65–99)
GLUCOSE-CAPILLARY: 83 mg/dL (ref 65–99)
GLUCOSE-CAPILLARY: 63 mg/dL — AB (ref 65–99)
GLUCOSE-CAPILLARY: 74 mg/dL (ref 65–99)
GLUCOSE-CAPILLARY: 115 mg/dL — AB (ref 65–99)
GLUCOSE-CAPILLARY: 118 mg/dL — AB (ref 65–99)
GLUCOSE-CAPILLARY: 90 mg/dL (ref 65–99)
Glucose-Capillary: 92 mg/dL (ref 65–99)
Glucose-Capillary: 135 mg/dL — ABNORMAL HIGH (ref 65–99)

## 2015-07-30 LAB — BASIC METABOLIC PANEL
ANION GAP: 19 — AB (ref 5–15)
ANION GAP: 13 (ref 5–15)
BUN: 64 mg/dL — ABNORMAL HIGH (ref 6–20)
BUN: 28 mg/dL — ABNORMAL HIGH (ref 6–20)
CALCIUM: 9.1 mg/dL (ref 8.9–10.3)
CHLORIDE: 93 mmol/L — AB (ref 101–111)
CO2: 26 mmol/L (ref 22–32)
CO2: 30 mmol/L (ref 22–32)
CREATININE: 9.79 mg/dL — AB (ref 0.44–1.00)
Calcium: 9.7 mg/dL (ref 8.9–10.3)
Chloride: 95 mmol/L — ABNORMAL LOW (ref 101–111)
Creatinine, Ser: 5.06 mg/dL — ABNORMAL HIGH (ref 0.44–1.00)
GFR calc Af Amer: 10 mL/min — ABNORMAL LOW (ref >60)
GFR calc non Af Amer: 4 mL/min — ABNORMAL LOW (ref >60)
GFR, EST AFRICAN AMERICAN: 4 mL/min — AB (ref >60)
GFR, EST NON AFRICAN AMERICAN: 8 mL/min — AB (ref >60)
GLUCOSE: 85 mg/dL (ref 65–99)
Glucose, Bld: 113 mg/dL — ABNORMAL HIGH (ref 65–99)
POTASSIUM: 7.4 mmol/L — AB (ref 3.5–5.1)
Potassium: 5.1 mmol/L (ref 3.5–5.1)
SODIUM: 138 mmol/L (ref 135–145)
SODIUM: 138 mmol/L (ref 135–145)

## 2015-07-30 MED ORDER — DEXTROSE-NACL 5-0.9 % IV SOLN
INTRAVENOUS | Status: DC
  Administered 2015-07-30: 03:00:00 via INTRAVENOUS

## 2015-07-30 MED ORDER — CALCIUM ACETATE (PHOS BINDER) 667 MG PO CAPS
2001.0000 mg | ORAL_CAPSULE | Freq: Three times a day (TID) | ORAL | Status: DC
  Administered 2015-07-30 – 2015-07-31 (×3): 2001 mg via ORAL
  Filled 2015-07-30 (×3): qty 3

## 2015-07-30 MED ORDER — EPOETIN ALFA 10000 UNIT/ML IJ SOLN
10000.0000 [IU] | INTRAMUSCULAR | Status: DC
  Administered 2015-07-30: 10000 [IU] via INTRAVENOUS

## 2015-07-30 MED ORDER — GUAIFENESIN-DM 100-10 MG/5ML PO SYRP
5.0000 mL | ORAL_SOLUTION | ORAL | Status: DC | PRN
  Administered 2015-07-30: 5 mL via ORAL
  Filled 2015-07-30: qty 5

## 2015-07-30 NOTE — Plan of Care (Signed)
Problem: Safety: Goal: Ability to remain free from injury will improve Outcome: Not Progressing Patient stated that she had a fall on Friday. Patient stated that she was weak.

## 2015-07-30 NOTE — Progress Notes (Signed)
START OF HD 

## 2015-07-30 NOTE — Progress Notes (Addendum)
Patient has multiple and frequent requests. Patient is not pleased about q2hour POCT. Patient requests to have bed lights turned off, but explained to patient that staff is unable to deactivate light under bed. Bed alarm is not applied per patient request.

## 2015-07-30 NOTE — Progress Notes (Signed)
[facility] [place] NAME: Kelsey Peterson    MR#:  [id number]  DATE OF BIRTH:  [birth year]  SUBJECTIVE:  CHIEF COMPLAINT:   Chief Complaint  Patient presents with  . Abdominal Pain  . Nausea  . Emesis  wants to stay here till Saturday, Hb 7.3, K 5.1 s/p HD. No new c/o REVIEW OF SYSTEMS:  Review of Systems  Constitutional: Positive for malaise/fatigue. Negative for fever, weight loss and diaphoresis.  HENT: Negative for ear discharge, ear pain, hearing loss, nosebleeds, sore throat and tinnitus.   Eyes: Negative for blurred vision and pain.  Respiratory: Negative for cough, hemoptysis, shortness of breath and wheezing.   Cardiovascular: Negative for chest pain, palpitations, orthopnea and leg swelling.  Gastrointestinal: Negative for heartburn, nausea, vomiting, abdominal pain, diarrhea, constipation and blood in stool.  Genitourinary: Negative for dysuria, urgency and frequency.  Musculoskeletal: Negative for myalgias and back pain.  Skin: Negative for itching and rash.  Neurological: Positive for weakness. Negative for dizziness, tingling, tremors, focal weakness, seizures and headaches.  Psychiatric/Behavioral: Negative for depression. The patient is not nervous/anxious.     DRUG ALLERGIES:   Allergies  Allergen Reactions  . Contrast Media [Iodinated Diagnostic Agents] Other (See Comments)    Unknown reaction  . Morphine And Related Hives  . Other Rash    Blood Pressure Pill (Pt doesn't remember name)    VITALS:  Blood pressure 138/78, pulse 82, temperature 97.9 F (36.6 C), temperature source Oral, resp. rate 18, height 5\' 4"  (1.626 m), weight 73.165 kg (161 lb 4.8 oz), SpO2 96 %. PHYSICAL EXAMINATION:  Physical Exam  Constitutional: She is oriented to person, place, and time and well-developed, well-nourished, and in no distress.  HENT:  Head: Normocephalic and atraumatic.  Eyes: Conjunctivae and EOM are normal. Pupils  are equal, round, and reactive to light.  Neck: Normal range of motion. Neck supple. No tracheal deviation present. No thyromegaly present.  Cardiovascular: Normal rate, regular rhythm and normal heart sounds.   Pulmonary/Chest: Effort normal and breath sounds normal. No respiratory distress. She has no wheezes. She exhibits no tenderness.  Abdominal: Soft. Bowel sounds are normal. She exhibits no distension. There is no tenderness.  Musculoskeletal: Normal range of motion.  Neurological: She is alert and oriented to person, place, and time. No cranial nerve deficit.  Skin: Skin is warm and dry. No rash noted.  Psychiatric: Mood and affect normal.   Access: Right arm AVG        LABORATORY PANEL:   CBC  Recent Labs Lab 07/30/15 1424  WBC 8.4  HGB 7.3*  HCT 21.8*  PLT 250   ------------------------------------------------------------------------------------------------------------------ Chemistries   Recent Labs Lab 07/29/15 1631  07/30/15 1424  NA 138  < > 138  K >7.5*  < > 5.1  CL 94*  < > 95*  CO2 14*  < > 30  GLUCOSE 135*  < > 85  BUN 53*  < > 28*  CREATININE 9.10*  < > 5.06*  CALCIUM 9.9  < > 9.1  AST 61*  --   --   ALT 25  --   --   ALKPHOS 109  --   --   BILITOT 2.5*  --   --   < > = values in this interval not displayed. RADIOLOGY:  No results found. ASSESSMENT AND PLAN:  60 year old African-American female history end-stage renal disease on dialysis presenting with weakness found to have remarkably elevated potassium.  1. Hyperkalemia: s/p hemodialysis today, K 5.1 after HD 2. Hypoglycemia: Secondary likely to poor oral intake: was on D5 but now doesn't have IV and hard to get new one in, she is able to maintain her sugars in 60-80s without IV, will encourage PO intake and consider more orange juice, follow Accu-Chek every 2 hours until she remained stable, diabetic nurse c/s 3. End-stage renal disease hemodialysis: Tuesday Thursday Saturday, nephrology  for dialysis today as above  4. Essential hypertension: Norvasc, Lopressor, imdur 5. GERD without esophagitis PPI therapy 5. Anemia of chronic kidney dz: Hb 7.3, monitor, no need of trasfusion Venous thromboembolism prophylactic: Heparin   She is hard of hearing, also has baseline speech disturbances which makes it difficult to converse, Nurse at bedside writing in notebook to make sure patient is understanding plans.  All the records are reviewed and case discussed with Care Management/Social Worker. Management plans discussed with the patient and she is in agreement.  CODE STATUS: FULL CODE  TOTAL TIME TAKING CARE OF THIS PATIENT: 35 minutes.   More than 50% of the time was spent in counseling/coordination of care: YES  POSSIBLE D/C IN 1-2 DAYS, DEPENDING ON CLINICAL CONDITION.   [facility], Meital Riehl M.D on 07/30/2015 at 3:41 PM  Between 7am to 6pm - Pager - [phone]  After 6pm go to [email or web address] - [employer] [facility]  Office  [phone]  CC: Primary care physician; No PCP Per Patient  Note: This dictation was prepared with Dragon dictation along with smaller phrase technology. Any transcriptional errors that result from this process are unintentional.

## 2015-07-30 NOTE — Progress Notes (Signed)
Patient has refused to take her daily medications after dialysis. Stating "I am afraid I will throw them up."

## 2015-07-30 NOTE — Progress Notes (Signed)
Communicated with Dr. Estanislado Pandy, about patient's poor PO intake. Patient is currently vomiting,and refusing to eat. BS has been dropping. Patient will possibly have dialysis this morning. Nurse informed Pyreddy that she spoke with crosley about possibly starting fluids, crosley stated to administer D5 in NS at 26ml/hr. Nurse questioned Pyreddy about administering fluids at 86ml/hr due to patient being on dialysis. Dr. Estanislado Pandy stated to continue with fluids.

## 2015-07-30 NOTE — Plan of Care (Signed)
Problem: Education: Goal: Knowledge of [facility] Education information/materials will improve Outcome: Not Progressing Patient stated that she missed her dialysis treatment. Nurse educated patient on the importance of treatment. Patient rolled her eyes and did not respond.

## 2015-07-30 NOTE — Progress Notes (Signed)
POST HD TX 

## 2015-07-30 NOTE — Care Management (Signed)
Admitted from home where patient lives alone with hyperkalemia, hypoglycemia. ESRD on dialysis Tues, Thurs, Sat. Following progression.

## 2015-07-30 NOTE — Progress Notes (Signed)
PRE HD   

## 2015-07-30 NOTE — Progress Notes (Signed)
Alert and oriented. CBG's are now stable and patient is eating better. Still no IV access, MD is aware. Vitals are stable. No complaints of pain. Patient did not take any of her daily meds today except for her calcium acetate at dinner time. Patient's son was here this evening to visit and requested information about becoming her healthcare power of attorney. Chaplain consult added. Patient will most likely discharge in the next day or two based off of her labs. She is to have a scheduled blood transfusion on Friday outpatient.

## 2015-07-30 NOTE — Progress Notes (Signed)
Post hd tx 

## 2015-07-30 NOTE — Progress Notes (Signed)
PRE HD ASSESSMENT 

## 2015-07-30 NOTE — Progress Notes (Signed)
Patient's CBG on arrival to unit after dialysis was 69. No signs and symptoms of hypoglycemia. Patient has had some orange juice, will recheck aroun 1400 due to patient request to wait. Patient has not eaten a meal since being admitted due to nausea, she has ordered a meal tray and feels like eating now. There is an order to run D5 normal saline and Dr. Manuella Ghazi was informed night shift stopped the fluids due to patient's breathing status. MD stated to restart at 43mL/hr. Patient's IV in her hand has infiltrated and is no longer usable. Both myself and charge nurse have assessed the patient's left arm for IV access and do not see any access to attempt. Supervisor called and she stated that if we do not see any access, she will not be successful either. Dr. Manuella Ghazi notified to see if ultrasound is needed and MD stated to hold off on IV site for the time being. Fluids will be discontinued and CBG's are still being checked every 2 hours. Patient is able to keep orange juice down is sugars were to drop again. Will closely monitor.

## 2015-07-30 NOTE — Progress Notes (Signed)
[facility]  ROUNDING NOTE   Subjective:   Admitted for hyperkalemia. Missed last dialysis treatment.  2K bath.     HEMODIALYSIS FLOWSHEET:  Blood Flow Rate (mL/min): 400 mL/min Arterial Pressure (mmHg): -140 mmHg Venous Pressure (mmHg): 250 mmHg Transmembrane Pressure (mmHg): 80 mmHg Ultrafiltration Rate (mL/min): 1170 mL/min Dialysate Flow Rate (mL/min): 600 ml/min Conductivity: Machine : 14.2 Conductivity: Machine : 14.2 Dialysis Fluid Bolus: Normal Saline Bolus Amount (mL): 250 mL (prime) Dialysate Change: 2K Intra-Hemodialysis Comments: 623.pt resting, vss, goal raised per pt request.    Objective:  Vital signs in last 24 hours:  Temp:  [97.1 F (36.2 C)-97.6 F (36.4 C)] 97.1 F (36.2 C) (07/11 0916) Pulse Rate:  [63-98] 66 (07/11 1000) Resp:  [18-47] 22 (07/11 1000) BP: (150-204)/(83-164) 163/101 mmHg (07/11 1000) SpO2:  [90 %-100 %] 97 % (07/11 1000) Weight:  [76.658 kg (169 lb)-79.7 kg (175 lb 11.3 oz)] 79.7 kg (175 lb 11.3 oz) (07/11 0916)  Weight change:  Filed Weights   07/29/15 1259 07/29/15 2017 07/30/15 0916  Weight: 79.379 kg (175 lb) 76.658 kg (169 lb) 79.7 kg (175 lb 11.3 oz)    Intake/Output:     Intake/Output this shift:     Physical Exam: General: NAD, laying in bed  Head: Normocephalic, atraumatic. Moist oral mucosal membranes  Eyes: Anicteric, PERRL  Neck: Supple, trachea midline  Lungs:  Clear to auscultation  Heart: Regular rate and rhythm  Abdomen:  Soft, nontender,   Extremities: no peripheral edema.  Neurologic: Nonfocal, moving all four extremities  Skin: No lesions  Access: Right arm AVG    Basic Metabolic Panel:  Recent Labs Lab 07/29/15 1631 07/30/15 0613  NA 138 138  K >7.5* 7.4*  CL 94* 93*  CO2 14* 26  GLUCOSE 135* 113*  BUN 53* 64*  CREATININE 9.10* 9.79*  CALCIUM 9.9 9.7    Liver Function Tests:  Recent Labs Lab 07/29/15 1631  AST 61*  ALT 25  ALKPHOS 109  BILITOT 2.5*  PROT 7.8   ALBUMIN 4.0    Recent Labs Lab 07/29/15 1631  LIPASE 18   No results for input(s): AMMONIA in the last 168 hours.  CBC:  Recent Labs Lab 07/29/15 1312 07/30/15 0613  WBC 6.9 9.5  HGB 7.6* 7.1*  HCT 23.1* 21.8*  MCV 78.9* 78.2*  PLT 299 284    Cardiac Enzymes:  Recent Labs Lab 07/29/15 1631  TROPONINI 0.08*    BNP: Invalid input(s): POCBNP  CBG:  Recent Labs Lab 07/29/15 2319 07/30/15 0114 07/30/15 0302 07/30/15 0547 07/30/15 0752  GLUCAP 91 69 84 92 85    Microbiology: Results for orders placed or performed during the hospital encounter of 07/29/15  MRSA PCR Screening     Status: Abnormal   Collection Time: 07/29/15  9:17 PM  Result Value Ref Range Status   MRSA by PCR POSITIVE (A) NEGATIVE Final    Comment:        The GeneXpert MRSA Assay (FDA approved for NASAL specimens only), is one component of a comprehensive MRSA colonization surveillance program. It is not intended to diagnose MRSA infection nor to guide or monitor treatment for MRSA infections. CRITICAL RESULT CALLED TO, READ BACK BY AND VERIFIED WITH: TIFFANY MATTOCKS @ [id number] ON 07/29/2015 BY CAF     Coagulation Studies: No results for input(s): LABPROT, INR in the last 72 hours.  Urinalysis: No results for input(s): COLORURINE, LABSPEC, PHURINE, GLUCOSEU, HGBUR, BILIRUBINUR, KETONESUR, PROTEINUR, UROBILINOGEN, NITRITE, LEUKOCYTESUR in the last  72 hours.  Invalid input(s): APPERANCEUR    Imaging: Dg Chest Portable 1 View  07/29/2015  CLINICAL DATA:  Abdominal pain. Emesis and nausea. Shortness of breath and fatigue it. EXAM: PORTABLE CHEST 1 VIEW COMPARISON:  Two-view chest x-ray 06/10/2015. FINDINGS: The heart is enlarged. Moderate pulmonary vascular congestion mild generalized edema is present. A chronic left pleural effusion is again noted. Associated airspace disease is similar to the prior exam. Rightward curvature of the mid thoracic spine is noted. The patient is rotated  to the left. The visualized soft tissues and bony thorax are unremarkable. IMPRESSION: 1. Cardiomegaly and mild edema compatible with congestive heart failure. Edema is somewhat worse than on the prior exam. 2. Chronic left pleural effusion and associated airspace disease. Electronically Signed   By: San Morelle M.D.   On: 07/29/2015 15:14     Medications:   . dextrose 5 % and 0.9% NaCl 75 mL/hr at 07/30/15 0307   . amiodarone  200 mg Oral Daily  . amLODipine  5 mg Oral Daily  . calcium acetate  1,334 mg Oral TID WC  . cinacalcet  60 mg Oral Daily  . clopidogrel  75 mg Oral Daily  . epoetin (EPOGEN/PROCRIT) injection  10,000 Units Intravenous Q T,Th,Sa-HD  . heparin  5,000 Units Subcutaneous Q8H  . isosorbide mononitrate  30 mg Oral Daily  . metoprolol tartrate  12.5 mg Oral BID  . mometasone-formoterol  2 puff Inhalation BID  . multivitamin with minerals  1 tablet Oral Daily  . pantoprazole  40 mg Oral BID  . sodium bicarbonate  50 mEq Intravenous Once  . sodium chloride flush  3 mL Intravenous Q12H  . sodium polystyrene  30 g Oral Once  . tiotropium  18 mcg Inhalation Daily   acetaminophen **OR** acetaminophen, guaiFENesin-dextromethorphan, hydrALAZINE, ipratropium-albuterol, ondansetron **OR** ondansetron (ZOFRAN) IV  Assessment/ Plan:  [name] is a 60 y.o. black female with hypertension, anemia of chronic kidney disease, secondary hyperparathyroidism, history of meningitis as a child with resultant hearing loss  1. End Stage Renal Disease: TTS [facility] [place] With hyperkalemia on admission requiring emergent dialysis.  - dialysis today with 2 K bath. 3 litre UF goal.   2. Anemia of chronic kidney disease:  - epo with treatment  3. Secondary Hyperparathyroidism: PTH >706on 3/23 as outpatient, and hyperphosphatemia 8.4 - Cinacalcet - calcium acetate for binding.    LOS: 1 Kelsey Peterson 7/11/201710:31 AM

## 2015-07-30 NOTE — Progress Notes (Signed)
HD TX END 

## 2015-07-30 NOTE — Progress Notes (Signed)
Explained to patient about risk for falls, patient has refused to wears yellow socks and have bed alarm activated.

## 2015-07-31 LAB — CBC
HEMATOCRIT: 19.9 % — AB (ref 35.0–47.0)
HEMOGLOBIN: 6.5 g/dL — AB (ref 12.0–16.0)
MCH: 25.7 pg — AB (ref 26.0–34.0)
MCHC: 32.9 g/dL (ref 32.0–36.0)
MCV: 78.1 fL — ABNORMAL LOW (ref 80.0–100.0)
Platelets: 225 10*3/uL (ref 150–440)
RBC: 2.55 MIL/uL — AB (ref 3.80–5.20)
RDW: 21.1 % — ABNORMAL HIGH (ref 11.5–14.5)
WBC: 6.5 10*3/uL (ref 3.6–11.0)

## 2015-07-31 LAB — GLUCOSE, CAPILLARY
GLUCOSE-CAPILLARY: 93 mg/dL (ref 65–99)
GLUCOSE-CAPILLARY: 86 mg/dL (ref 65–99)
GLUCOSE-CAPILLARY: 75 mg/dL (ref 65–99)
GLUCOSE-CAPILLARY: 97 mg/dL (ref 65–99)
Glucose-Capillary: 100 mg/dL — ABNORMAL HIGH (ref 65–99)
Glucose-Capillary: 111 mg/dL — ABNORMAL HIGH (ref 65–99)
Glucose-Capillary: 90 mg/dL (ref 65–99)
Glucose-Capillary: 91 mg/dL (ref 65–99)

## 2015-07-31 LAB — PREPARE RBC (CROSSMATCH)

## 2015-07-31 LAB — BASIC METABOLIC PANEL
Anion gap: 10 (ref 5–15)
BUN: 33 mg/dL — AB (ref 6–20)
CALCIUM: 9.3 mg/dL (ref 8.9–10.3)
CHLORIDE: 97 mmol/L — AB (ref 101–111)
CO2: 32 mmol/L (ref 22–32)
CREATININE: 6.16 mg/dL — AB (ref 0.44–1.00)
GFR, EST AFRICAN AMERICAN: 8 mL/min — AB (ref >60)
GFR, EST NON AFRICAN AMERICAN: 7 mL/min — AB (ref >60)
Glucose, Bld: 87 mg/dL (ref 65–99)
POTASSIUM: 5.6 mmol/L — AB (ref 3.5–5.1)
Sodium: 139 mmol/L (ref 135–145)

## 2015-07-31 LAB — HEPATITIS B SURFACE ANTIGEN: Hepatitis B Surface Ag: NEGATIVE

## 2015-07-31 MED ORDER — SODIUM CHLORIDE 0.9 % IV SOLN
Freq: Once | INTRAVENOUS | Status: DC
Start: 2015-07-31 — End: 2015-07-31

## 2015-07-31 MED ORDER — MUPIROCIN CALCIUM 2 % EX CREA
TOPICAL_CREAM | Freq: Two times a day (BID) | CUTANEOUS | Status: DC
  Filled 2015-07-31: qty 15

## 2015-07-31 MED ORDER — MUPIROCIN 2 % EX OINT
TOPICAL_OINTMENT | Freq: Two times a day (BID) | CUTANEOUS | Status: DC
  Filled 2015-07-31: qty 22

## 2015-07-31 NOTE — Discharge Instructions (Signed)
Hyperkalemia  Hyperkalemia is when you have too much potassium in your blood. Potassium is normally removed (excreted) from your body by your kidneys. If there is too much potassium in your blood, it can affect your heart's ability to function.   CAUSES   Hyperkalemia may be caused by:   · Taking in too much potassium. You can do this by:    Using salt substitutes. They contain large amounts of potassium.    Taking potassium supplements.    Eating foods high in potassium.  · Excreting too little potassium. This can happen if:    Your kidneys are not working properly. Kidney (renal) disease, including short- or long-term renal failure, is a very common cause of hyperkalemia.    You are taking medicines that lower your excretion of potassium.    You have Addison disease.    You have a urinary tract blockage, such as kidney stones.    You are on treatment to mechanically clean your blood (dialysis) and you skip a treatment.  · Releasing a high amount of potassium from your cells into your blood. This can happen with:    Injury to muscles (rhabdomyolysis) or other tissues. Most potassium is stored in your muscles.    Severe burns or infections.    Acidic blood plasma (acidosis). Acidosis can result from many diseases, such as uncontrolled diabetes.  RISK FACTORS  The most common risk factor of hyperkalemia is kidney disease. Other risk factors of hyperkalemia include:  · Addison disease. This is a condition where your glands do not produce enough hormones.  · Alcoholism or heavy drug use.    · Using certain blood pressure medicines, such as angiotensin-converting enzyme (ACE) inhibitors, angiotensin II receptor blockers (ARBs), or potassium-sparing diuretics such as spironolactone.  · Severe injury or burn.  SIGNS AND SYMPTOMS   Oftentimes, there are no signs or symptoms of hyperkalemia.  However, when your potassium level becomes high enough, you may experience symptoms such as:  · Irregular or very slow  heartbeat.  · Nausea.  · Fatigue.  · Tingling of the skin or numbness of the hands or feet.  · Muscle weakness.  · Fatigue.  · Not being able to move (paralysis).  You may not have any symptoms of hyperkalemia.   DIAGNOSIS   Hyperkalemia may be diagnosed by:  · Physical exam.  · Blood tests.  · ECG (electrocardiogram).  · Discussion of prescription and non-prescription drug use.  TREATMENT   Treatment for hyperkalemia is often directed at the underlying cause. In some instances, treatment may include:   · Insulin.  · Glucose (sugar) and water solution given through a vein (intravenous or IV ).  · Dialysis.  · Medicines to remove the potassium from your body.  · Medicines to move calcium from your bloodstream into your tissues.  HOME CARE INSTRUCTIONS   · Take medicines only as directed by your health care provider.  · Do not take any supplements, natural products, herbs, or vitamins without reviewing them with your health care provider. Certain supplements and natural food products can have high amounts of potassium.  · Limit your alcohol intake as directed by your health care provider.  · Stop illegal drug use. If you need help quitting, ask your health care provider.  · Keep all follow-up visits as directed by your health care provider. This is important.  · If you have kidney disease, you may need to follow a low potassium diet. A dietitian can help educate you on   low potassium foods.  SEEK MEDICAL CARE IF:   · You notice an irregular or very slow heartbeat.  · You feel light-headed.  · You feel weak.  · You are nauseous.  · You have tingling or numbness in your hands or feet.  SEEK IMMEDIATE MEDICAL CARE IF:   · You have shortness of breath.  · You have chest pain or discomfort.  · You pass out.  · You have muscle paralysis.  MAKE SURE YOU:   · Understand these instructions.  · Will watch your condition.  · Will get help right away if you are not doing well or get worse.     This information is not intended to  replace advice given to you by your health care provider. Make sure you discuss any questions you have with your health care provider.     Document Released: 12/26/2001 Document Revised: 01/26/2014 Document Reviewed: 04/12/2013  Elsevier Interactive Patient Education ©2016 [employer]

## 2015-07-31 NOTE — Progress Notes (Signed)
Noted consult regarding hypoglycemia. In reviewing the chart noted patient does not have a history of diabetes and has not been given any DM medications. Hypoglycemia likely due to poor PO intake.  CBGs appear stable at this time and have been consistently stable over the past 12 hours. CBGs are ordered Q2H, if CBGs continue to be stable may want to change frequency to Q4H. Will continue to follow.   Thanks, Barnie Alderman, RN, MSN, CDE Diabetes Coordinator [facility] [phone] (Team Pager from [place] to [place]) [phone] ([facility] office) [phone] [facility] office) [phone] [facility] office)

## 2015-07-31 NOTE — Progress Notes (Signed)
POST HD ASSESSMENT 

## 2015-07-31 NOTE — Progress Notes (Signed)
PRE HD ASSESSMENT 

## 2015-07-31 NOTE — Progress Notes (Signed)
PRE HD TX 

## 2015-07-31 NOTE — Plan of Care (Signed)
Problem: Safety: Goal: Ability to remain free from injury will improve Outcome: Not Progressing Patient is non compliant with safety plan.  Problem: Health Behavior/Discharge Planning: Goal: Ability to manage health-related needs will improve Outcome: Not Progressing Patient is not compliant with diet order and fluid restriction.

## 2015-07-31 NOTE — Progress Notes (Signed)
This note also relates to the following rows which could not be included: Pulse Rate - Cannot attach notes to unvalidated device data Resp - Cannot attach notes to unvalidated device data SpO2 - Cannot attach notes to unvalidated device data   POST HD VITALS 

## 2015-07-31 NOTE — Progress Notes (Signed)
[facility]  ROUNDING NOTE   Subjective:   Potassium up at 5.6. Hemoglobin 6.5  Ordered extra treatment of dialysis to administer 1 unit PRBC transfusion.   Objective:  Vital signs in last 24 hours:  Temp:  [97.4 F (36.3 C)-98.2 F (36.8 C)] 98 F (36.7 C) (07/12 0935) Pulse Rate:  [69-84] 69 (07/12 0935) Resp:  [17-25] 18 (07/12 0935) BP: (130-168)/(65-102) 143/86 mmHg (07/12 0935) SpO2:  [95 %-100 %] 95 % (07/12 0935) Weight:  [73.1 kg (161 lb 2.5 oz)-73.165 kg (161 lb 4.8 oz)] 73.1 kg (161 lb 2.5 oz) (07/12 0424)  Weight change: 0.321 kg (11.3 oz) Filed Weights   07/30/15 0916 07/30/15 1316 07/31/15 0424  Weight: 79.7 kg (175 lb 11.3 oz) 73.165 kg (161 lb 4.8 oz) 73.1 kg (161 lb 2.5 oz)    Intake/Output: I/O last 3 completed shifts: In: -  Out: 3000 [Other:3000]   Intake/Output this shift:     Physical Exam: General: NAD, sitting in bed  Head: +hard of hearing  Eyes: Anicteric, PERRL  Neck: Supple, trachea midline  Lungs:  Clear to auscultation  Heart: Regular rate and rhythm  Abdomen:  Soft, nontender,   Extremities: no peripheral edema.  Neurologic: Nonfocal, moving all four extremities  Skin: No lesions  Access: Right arm AVG    Basic Metabolic Panel:  Recent Labs Lab 07/29/15 1631 07/30/15 0613 07/30/15 1424 07/31/15 0529  NA 138 138 138 139  K >7.5* 7.4* 5.1 5.6*  CL 94* 93* 95* 97*  CO2 14* 26 30 32  GLUCOSE 135* 113* 85 87  BUN 53* 64* 28* 33*  CREATININE 9.10* 9.79* 5.06* 6.16*  CALCIUM 9.9 9.7 9.1 9.3    Liver Function Tests:  Recent Labs Lab 07/29/15 1631  AST 61*  ALT 25  ALKPHOS 109  BILITOT 2.5*  PROT 7.8  ALBUMIN 4.0    Recent Labs Lab 07/29/15 1631  LIPASE 18   No results for input(s): AMMONIA in the last 168 hours.  CBC:  Recent Labs Lab 07/29/15 1312 07/30/15 0613 07/30/15 1424 07/31/15 0529  WBC 6.9 9.5 8.4 6.5  HGB 7.6* 7.1* 7.3* 6.5*  HCT 23.1* 21.8* 21.8* 19.9*  MCV 78.9* 78.2*  77.4* 78.1*  PLT 299 284 250 225    Cardiac Enzymes:  Recent Labs Lab 07/29/15 1631  TROPONINI 0.08*    BNP: Invalid input(s): POCBNP  CBG:  Recent Labs Lab 07/30/15 2201 07/31/15 0004 07/31/15 0156 07/31/15 0422 07/31/15 0821  GLUCAP 90 100* 93 91 86    Microbiology: Results for orders placed or performed during the hospital encounter of 07/29/15  MRSA PCR Screening     Status: Abnormal   Collection Time: 07/29/15  9:17 PM  Result Value Ref Range Status   MRSA by PCR POSITIVE (A) NEGATIVE Final    Comment:        The GeneXpert MRSA Assay (FDA approved for NASAL specimens only), is one component of a comprehensive MRSA colonization surveillance program. It is not intended to diagnose MRSA infection nor to guide or monitor treatment for MRSA infections. CRITICAL RESULT CALLED TO, READ BACK BY AND VERIFIED WITH: TIFFANY MATTOCKS @ [id number] ON 07/29/2015 BY CAF     Coagulation Studies: No results for input(s): LABPROT, INR in the last 72 hours.  Urinalysis: No results for input(s): COLORURINE, LABSPEC, PHURINE, GLUCOSEU, HGBUR, BILIRUBINUR, KETONESUR, PROTEINUR, UROBILINOGEN, NITRITE, LEUKOCYTESUR in the last 72 hours.  Invalid input(s): APPERANCEUR    Imaging: Dg Chest Portable 1 View  07/29/2015  CLINICAL DATA:  Abdominal pain. Emesis and nausea. Shortness of breath and fatigue it. EXAM: PORTABLE CHEST 1 VIEW COMPARISON:  Two-view chest x-ray 06/10/2015. FINDINGS: The heart is enlarged. Moderate pulmonary vascular congestion mild generalized edema is present. A chronic left pleural effusion is again noted. Associated airspace disease is similar to the prior exam. Rightward curvature of the mid thoracic spine is noted. The patient is rotated to the left. The visualized soft tissues and bony thorax are unremarkable. IMPRESSION: 1. Cardiomegaly and mild edema compatible with congestive heart failure. Edema is somewhat worse than on the prior exam. 2. Chronic left  pleural effusion and associated airspace disease. Electronically Signed   By: San Morelle M.D.   On: 07/29/2015 15:14     Medications:     . sodium chloride   Intravenous Once  . amiodarone  200 mg Oral Daily  . amLODipine  5 mg Oral Daily  . calcium acetate  2,001 mg Oral TID WC  . cinacalcet  60 mg Oral Daily  . clopidogrel  75 mg Oral Daily  . heparin  5,000 Units Subcutaneous Q8H  . isosorbide mononitrate  30 mg Oral Daily  . metoprolol tartrate  12.5 mg Oral BID  . mometasone-formoterol  2 puff Inhalation BID  . multivitamin with minerals  1 tablet Oral Daily  . mupirocin cream   Topical BID  . pantoprazole  40 mg Oral BID  . sodium bicarbonate  50 mEq Intravenous Once  . sodium chloride flush  3 mL Intravenous Q12H  . sodium polystyrene  30 g Oral Once  . tiotropium  18 mcg Inhalation Daily   acetaminophen **OR** acetaminophen, guaiFENesin-dextromethorphan, hydrALAZINE, ipratropium-albuterol, ondansetron **OR** ondansetron (ZOFRAN) IV  Assessment/ Plan:  [name] is a 60 y.o. black female with hypertension, anemia of chronic kidney disease, secondary hyperparathyroidism, history of meningitis as a child with resultant hearing loss  1. End Stage Renal Disease: TTS [facility] [place] With hyperkalemia on admission requiring emergent dialysis. Now with persistent hyperkalemia requiring an extra treatment.  - dialysis today with 2 K bath. Orders prepared.   2. Anemia of chronic kidney disease:  - 1 unit PRBC ordered.  - epo with treatment TTS  3. Secondary Hyperparathyroidism: PTH >706 on 3/23 as outpatient, and hyperphosphatemia 8.4 - Cinacalcet - calcium acetate for binding.    LOS: [place] 7/12/201710:03 AM

## 2015-07-31 NOTE — Progress Notes (Signed)
This note also relates to the following rows which could not be included: Pulse Rate - Cannot attach notes to unvalidated device data Resp - Cannot attach notes to unvalidated device data   [facility] [place]

## 2015-07-31 NOTE — Progress Notes (Signed)
Patient is discharge home in a stable condition, summary and f/u care given to both pt and son cerbalized understanding , left wit son

## 2015-07-31 NOTE — Progress Notes (Signed)
HD TX START 

## 2015-08-02 ENCOUNTER — Ambulatory Visit
Admission: RE | Admit: 2015-08-02 | Discharge: 2015-08-02 | Disposition: A | Discharge status: Home / Self Care | Payer: Medicare Other | Source: Ambulatory Visit | Attending: Nephrology | Admitting: Nephrology

## 2015-08-02 DIAGNOSIS — N186 End stage renal disease: Secondary | ICD-10-CM | POA: Insufficient documentation

## 2015-08-02 DIAGNOSIS — D631 Anemia in chronic kidney disease: Secondary | ICD-10-CM | POA: Diagnosis present

## 2015-08-02 LAB — TYPE AND SCREEN
ABO/RH(D): O NEG
ANTIBODY SCREEN: NEGATIVE
UNIT DIVISION: 0

## 2015-08-02 LAB — HEMOGLOBIN: Hemoglobin: 8 g/dL — ABNORMAL LOW (ref 12.0–16.0)

## 2015-08-02 MED ORDER — SODIUM CHLORIDE 0.9 % IV SOLN
Freq: Once | INTRAVENOUS | Status: AC
  Administered 2015-08-02: 10:00:00 via INTRAVENOUS

## 2015-08-02 NOTE — Discharge Summary (Signed)
[facility] [place] [facility] [place] NAME: Kelsey Peterson    MR#:  [id number]  DATE OF BIRTH:  10-May-1955  DATE OF ADMISSION:  07/29/2015 ADMITTING PHYSICIAN: Lytle Butte, MD  DATE OF DISCHARGE: 07/31/2015  5:49 PM  PRIMARY CARE PHYSICIAN: No PCP Per Patient    ADMISSION DIAGNOSIS:  Hyperkalemia [E87.5] Hypoglycemia [E16.2]  DISCHARGE DIAGNOSIS:  Active Problems:   Hyperkalemia  SECONDARY DIAGNOSIS:   Past Medical History  Diagnosis Date  . ESRD on hemodialysis ([place])   . Secondary hyperparathyroidism of renal origin ([place])   . Hepatitis C   . Migraine with aura   . HOH (hard of hearing)   . Mitral regurgitation     a. calcified mitral annulus, mod MR by echo in 06/2014  . Cognitive impairment   . CAD (coronary artery disease)   . HTN (hypertension)   . Chronic systolic CHF (congestive heart failure) (HCC)     a. EF 45% by echo in 06/2014  . Paroxysmal SVT (supraventricular tachycardia) ([place])     a. history of this, with last known occurrence in 06/2014  . COPD (chronic obstructive pulmonary disease) ([place]) 07/14/2014  . GERD (gastroesophageal reflux disease) 07/14/2014  . Anemia of chronic disease     HOSPITAL COURSE:  60 year old African-American female history end-stage renal disease on dialysis admitted for weakness found to have remarkably elevated potassium.  1. Hyperkalemia: s/p hemodialysis and K normalized 2. Hypoglycemia: Secondary likely to poor oral intake, resolved and sugars maintaining ok. 3. End-stage renal disease hemodialysis: Tuesday Thursday Saturday 4. Essential hypertension: Norvasc, Lopressor, imdur 5. GERD without esophagitis PPI therapy 5. Anemia of chronic kidney dz: Hb 7.3, monitor, no need of trasfusion  DISCHARGE CONDITIONS:   stable  CONSULTS OBTAINED:  Treatment Team:  Lytle Butte, MD Lavonia Dana, MD  DRUG ALLERGIES:   Allergies  Allergen Reactions  . Contrast Media [Iodinated  Diagnostic Agents] Other (See Comments)    Unknown reaction  . Morphine And Related Hives  . Other Rash    Blood Pressure Pill (Pt doesn't remember name)     DISCHARGE MEDICATIONS:   Discharge Medication List as of 07/31/2015  4:56 PM    CONTINUE these medications which have NOT CHANGED   Details  albuterol (PROVENTIL HFA;VENTOLIN HFA) 108 (90 Base) MCG/ACT inhaler Inhale 2 puffs into the lungs every 6 (six) hours as needed for wheezing or shortness of breath., Starting 04/04/2015, Until Discontinued, Normal    amiodarone (PACERONE) 200 MG tablet Take 1 tablet (200 mg total) by mouth daily., Starting 04/04/2015, Until Discontinued, No Print    amLODipine (NORVASC) 5 MG tablet Take 5 mg by mouth daily., Until Discontinued, Historical Med    calcium acetate (PHOSLO) 667 MG capsule Take 2 capsules by mouth 3 (three) times daily with meals. And one capsule with snakes, Starting 10/05/2014, Until Discontinued, Historical Med    cinacalcet (SENSIPAR) 60 MG tablet Take 60 mg by mouth daily., Until Discontinued, Historical Med    clopidogrel (PLAVIX) 75 MG tablet Take 75 mg by mouth daily., Starting 08/31/2014, Until Discontinued, Historical Med    Fluticasone-Salmeterol (ADVAIR DISKUS) 250-50 MCG/DOSE AEPB Inhale 1 puff into the lungs 2 (two) times daily., Starting 04/04/2015, Until Discontinued, Normal    folic acid-vitamin b complex-vitamin c-selenium-zinc (DIALYVITE) 3 MG TABS tablet Take 1 tablet by mouth daily., Until Discontinued, Historical Med    furosemide (LASIX) 80 MG tablet Take 80 mg by mouth daily. Patient takes on non  dialysis days., Until Discontinued, Historical Med    !! ipratropium-albuterol (DUONEB) 0.5-2.5 (3) MG/3ML SOLN Take 3 mLs by nebulization 4 (four) times daily as needed. DX: COPD DX Code: J44.9, Starting 04/16/2015, Until Discontinued, Normal    !! ipratropium-albuterol (DUONEB) 0.5-2.5 (3) MG/3ML SOLN Take 3 mLs by nebulization every 4 (four) hours as needed.,  Starting 04/23/2015, Until Discontinued, Print    isosorbide mononitrate (IMDUR) 30 MG 24 hr tablet Take 30 mg by mouth daily., Until Discontinued, Historical Med    metoprolol tartrate (LOPRESSOR) 25 MG tablet Take 12.5 mg by mouth 2 (two) times daily., Starting 10/21/2014, Until Discontinued, Historical Med    ondansetron (ZOFRAN) 4 MG tablet Take 4 mg by mouth every 8 (eight) hours as needed for nausea or vomiting. Reported on 02/05/2015, Until Discontinued, Historical Med    pantoprazole (PROTONIX) 40 MG tablet Take 1 tablet (40 mg total) by mouth 2 (two) times daily., Starting 04/23/2015, Until Discontinued, Print    sodium polystyrene (KAYEXALATE) 15 GM/60ML suspension Take 60 mLs (15 g total) by mouth once., Starting 05/13/2015, Print    SPIRIVA HANDIHALER 18 MCG inhalation capsule Take 1 puff by mouth daily., Starting 05/02/2015, Until Discontinued, Historical Med     !! - Potential duplicate medications found. Please discuss with provider.       DISCHARGE INSTRUCTIONS:    DIET:  Renal diet  DISCHARGE CONDITION:  Good  ACTIVITY:  Activity as tolerated  OXYGEN:  Home Oxygen: No.   Oxygen Delivery: room air  DISCHARGE LOCATION:  home   If you experience worsening of your admission symptoms, develop shortness of breath, life threatening emergency, suicidal or homicidal thoughts you must seek medical attention immediately by calling 911 or calling your MD immediately  if symptoms less severe.  You Must read complete instructions/literature along with all the possible adverse reactions/side effects for all the Medicines you take and that have been prescribed to you. Take any new Medicines after you have completely understood and accpet all the possible adverse reactions/side effects.   Please note  You were cared for by a hospitalist during your hospital stay. If you have any questions about your discharge medications or the care you received while you were in the hospital  after you are discharged, you can call the unit and asked to speak with the hospitalist on call if the hospitalist that took care of you is not available. Once you are discharged, your primary care physician will handle any further medical issues. Please note that NO REFILLS for any discharge medications will be authorized once you are discharged, as it is imperative that you return to your primary care physician (or establish a relationship with a primary care physician if you do not have one) for your aftercare needs so that they can reassess your need for medications and monitor your lab values.    On the day of Discharge:  VITAL SIGNS:  Blood pressure 142/82, pulse 71, temperature 97.9 F (36.6 C), temperature source Axillary, resp. rate 17, height 5\' 4"  (1.626 m), weight 74.4 kg (164 lb 0.4 oz), SpO2 100 %.  PHYSICAL EXAMINATION:  GENERAL:  60 y.o.-year-old patient lying in the bed with no acute distress.  EYES: Pupils equal, round, reactive to light and accommodation. No scleral icterus. Extraocular muscles intact.  HEENT: Head atraumatic, normocephalic. Oropharynx and nasopharynx clear.  NECK:  Supple, no jugular venous distention. No thyroid enlargement, no tenderness.  LUNGS: Normal breath sounds bilaterally, no wheezing, rales,rhonchi or crepitation. No use of  accessory muscles of respiration.  CARDIOVASCULAR: S1, S2 normal. No murmurs, rubs, or gallops.  ABDOMEN: Soft, non-tender, non-distended. Bowel sounds present. No organomegaly or mass.  EXTREMITIES: No pedal edema, cyanosis, or clubbing.  NEUROLOGIC: Cranial nerves II through XII are intact. Muscle strength 5/5 in all extremities. Sensation intact. Gait not checked.  PSYCHIATRIC: The patient is alert and oriented x 3.  SKIN: No obvious rash, lesion, or ulcer.  DATA REVIEW:   CBC  Recent Labs Lab 07/31/15 0529 08/02/15 0949  WBC 6.5  --   HGB 6.5* 8.0*  HCT 19.9*  --   PLT 225  --     Chemistries   Recent  Labs Lab 07/29/15 1631  07/31/15 0529  NA 138  < > 139  K >7.5*  < > 5.6*  CL 94*  < > 97*  CO2 14*  < > 32  GLUCOSE 135*  < > 87  BUN 53*  < > 33*  CREATININE 9.10*  < > 6.16*  CALCIUM 9.9  < > 9.3  AST 61*  --   --   ALT 25  --   --   ALKPHOS 109  --   --   BILITOT 2.5*  --   --   < > = values in this interval not displayed.  Cardiac Enzymes  Recent Labs Lab 07/29/15 1631  TROPONINI 0.08*    Microbiology Results  Results for orders placed or performed during the hospital encounter of 07/29/15  MRSA PCR Screening     Status: Abnormal   Collection Time: 07/29/15  9:17 PM  Result Value Ref Range Status   MRSA by PCR POSITIVE (A) NEGATIVE Final    Comment:        The GeneXpert MRSA Assay (FDA approved for NASAL specimens only), is one component of a comprehensive MRSA colonization surveillance program. It is not intended to diagnose MRSA infection nor to guide or monitor treatment for MRSA infections. CRITICAL RESULT CALLED TO, READ BACK BY AND VERIFIED WITH: TIFFANY MATTOCKS @ [id number] ON 07/29/2015 BY [facility]      Management plans discussed with the patient, family and they are in agreement.  CODE STATUS: FULL CODE  TOTAL TIME TAKING CARE OF THIS PATIENT: 45 minutes.    [facility], Baley Lorimer M.D on 08/02/2015 at 1:49 PM  Between 7am to 6pm - Pager - [phone]  After 6pm go to [email or web address] - password [email or web address] [facility]  [place] [facility]  Office  [phone]  CC: Primary care physician; No PCP Per Patient   Note: This dictation was prepared with Dragon dictation along with smaller phrase technology. Any transcriptional errors that result from this process are unintentional.

## 2015-08-02 NOTE — OR Nursing (Signed)
Dr Elwyn Lade office instructed pt be discharged to home without blood transfusion since receiving one since this order was received and hgb has increased. Transportation services notified.

## 2015-08-02 NOTE — OR Nursing (Signed)
Discovered that patient received a unit of blood Wednesday while she was at dialysis. Dr. Elwyn Lade office called and spoke with his nurse Anderson Malta. She stated to discontinue order for transfusion and send a hgb. Results of hgb are 8.0. His office has been notifed.

## 2015-08-02 NOTE — OR Nursing (Signed)
Pt discharge via wheelchair.

## 2015-10-07 ENCOUNTER — Emergency Department: Payer: Medicare Other

## 2015-10-07 ENCOUNTER — Inpatient Hospital Stay
Admission: EM | Admit: 2015-10-07 | Discharge: 2015-10-10 | DRG: 291 | Disposition: A | Discharge status: Home / Self Care | Payer: Medicare Other | Attending: Internal Medicine | Admitting: Internal Medicine

## 2015-10-07 DIAGNOSIS — E875 Hyperkalemia: Secondary | ICD-10-CM | POA: Diagnosis present

## 2015-10-07 DIAGNOSIS — R2681 Unsteadiness on feet: Secondary | ICD-10-CM

## 2015-10-07 DIAGNOSIS — I2510 Atherosclerotic heart disease of native coronary artery without angina pectoris: Secondary | ICD-10-CM | POA: Diagnosis present

## 2015-10-07 DIAGNOSIS — Z8661 Personal history of infections of the central nervous system: Secondary | ICD-10-CM

## 2015-10-07 DIAGNOSIS — Z8249 Family history of ischemic heart disease and other diseases of the circulatory system: Secondary | ICD-10-CM

## 2015-10-07 DIAGNOSIS — I868 Varicose veins of other specified sites: Secondary | ICD-10-CM | POA: Diagnosis present

## 2015-10-07 DIAGNOSIS — N2581 Secondary hyperparathyroidism of renal origin: Secondary | ICD-10-CM | POA: Diagnosis present

## 2015-10-07 DIAGNOSIS — Z79899 Other long term (current) drug therapy: Secondary | ICD-10-CM | POA: Diagnosis not present

## 2015-10-07 DIAGNOSIS — J9621 Acute and chronic respiratory failure with hypoxia: Secondary | ICD-10-CM | POA: Diagnosis present

## 2015-10-07 DIAGNOSIS — I509 Heart failure, unspecified: Secondary | ICD-10-CM

## 2015-10-07 DIAGNOSIS — M170 Bilateral primary osteoarthritis of knee: Secondary | ICD-10-CM | POA: Diagnosis present

## 2015-10-07 DIAGNOSIS — D631 Anemia in chronic kidney disease: Secondary | ICD-10-CM | POA: Diagnosis present

## 2015-10-07 DIAGNOSIS — I5023 Acute on chronic systolic (congestive) heart failure: Principal | ICD-10-CM | POA: Diagnosis present

## 2015-10-07 DIAGNOSIS — E162 Hypoglycemia, unspecified: Secondary | ICD-10-CM | POA: Diagnosis not present

## 2015-10-07 DIAGNOSIS — J449 Chronic obstructive pulmonary disease, unspecified: Secondary | ICD-10-CM | POA: Diagnosis present

## 2015-10-07 DIAGNOSIS — Z7902 Long term (current) use of antithrombotics/antiplatelets: Secondary | ICD-10-CM | POA: Diagnosis not present

## 2015-10-07 DIAGNOSIS — Z992 Dependence on renal dialysis: Secondary | ICD-10-CM

## 2015-10-07 DIAGNOSIS — N186 End stage renal disease: Secondary | ICD-10-CM | POA: Diagnosis present

## 2015-10-07 DIAGNOSIS — I471 Supraventricular tachycardia: Secondary | ICD-10-CM | POA: Diagnosis present

## 2015-10-07 DIAGNOSIS — R52 Pain, unspecified: Secondary | ICD-10-CM

## 2015-10-07 DIAGNOSIS — R791 Abnormal coagulation profile: Secondary | ICD-10-CM | POA: Diagnosis present

## 2015-10-07 DIAGNOSIS — I340 Nonrheumatic mitral (valve) insufficiency: Secondary | ICD-10-CM | POA: Diagnosis present

## 2015-10-07 DIAGNOSIS — R0600 Dyspnea, unspecified: Secondary | ICD-10-CM

## 2015-10-07 DIAGNOSIS — H9190 Unspecified hearing loss, unspecified ear: Secondary | ICD-10-CM | POA: Diagnosis present

## 2015-10-07 DIAGNOSIS — J9601 Acute respiratory failure with hypoxia: Secondary | ICD-10-CM

## 2015-10-07 DIAGNOSIS — I132 Hypertensive heart and chronic kidney disease with heart failure and with stage 5 chronic kidney disease, or end stage renal disease: Secondary | ICD-10-CM | POA: Diagnosis present

## 2015-10-07 DIAGNOSIS — R262 Difficulty in walking, not elsewhere classified: Secondary | ICD-10-CM

## 2015-10-07 LAB — CBC WITH DIFFERENTIAL/PLATELET
BASOS ABS: 0 10*3/uL (ref 0–0.1)
Basophils Relative: 1 %
Eosinophils Absolute: 0.2 10*3/uL (ref 0–0.7)
Eosinophils Relative: 5 %
HEMATOCRIT: 26.3 % — AB (ref 35.0–47.0)
Hemoglobin: 8.6 g/dL — ABNORMAL LOW (ref 12.0–16.0)
LYMPHS ABS: 0.7 10*3/uL — AB (ref 1.0–3.6)
LYMPHS PCT: 15 %
MCH: 25.7 pg — AB (ref 26.0–34.0)
MCHC: 32.9 g/dL (ref 32.0–36.0)
MCV: 78.1 fL — AB (ref 80.0–100.0)
Monocytes Absolute: 0.6 10*3/uL (ref 0.2–0.9)
Monocytes Relative: 12 %
NEUTROS ABS: 3.3 10*3/uL (ref 1.4–6.5)
Neutrophils Relative %: 67 %
Platelets: 175 10*3/uL (ref 150–440)
RBC: 3.37 MIL/uL — AB (ref 3.80–5.20)
RDW: 22.6 % — ABNORMAL HIGH (ref 11.5–14.5)
WBC: 4.8 10*3/uL (ref 3.6–11.0)

## 2015-10-07 LAB — BASIC METABOLIC PANEL
ANION GAP: 10 (ref 5–15)
BUN: 57 mg/dL — ABNORMAL HIGH (ref 6–20)
CO2: 28 mmol/L (ref 22–32)
Calcium: 9.3 mg/dL (ref 8.9–10.3)
Chloride: 93 mmol/L — ABNORMAL LOW (ref 101–111)
Creatinine, Ser: 8.03 mg/dL — ABNORMAL HIGH (ref 0.44–1.00)
GFR calc Af Amer: 6 mL/min — ABNORMAL LOW (ref >60)
GFR, EST NON AFRICAN AMERICAN: 5 mL/min — AB (ref >60)
GLUCOSE: 86 mg/dL (ref 65–99)
POTASSIUM: 6.6 mmol/L — AB (ref 3.5–5.1)
Sodium: 131 mmol/L — ABNORMAL LOW (ref 135–145)

## 2015-10-07 LAB — GLUCOSE, CAPILLARY
GLUCOSE-CAPILLARY: 11 mg/dL — AB (ref 65–99)
GLUCOSE-CAPILLARY: 79 mg/dL (ref 65–99)

## 2015-10-07 LAB — COMPREHENSIVE METABOLIC PANEL
ALK PHOS: 109 U/L (ref 38–126)
ALT: 22 U/L (ref 14–54)
AST: 38 U/L (ref 15–41)
Albumin: 3.7 g/dL (ref 3.5–5.0)
Anion gap: 9 (ref 5–15)
BILIRUBIN TOTAL: 0.6 mg/dL (ref 0.3–1.2)
BUN: 55 mg/dL — AB (ref 6–20)
CALCIUM: 9.5 mg/dL (ref 8.9–10.3)
CHLORIDE: 92 mmol/L — AB (ref 101–111)
CO2: 29 mmol/L (ref 22–32)
CREATININE: 7.75 mg/dL — AB (ref 0.44–1.00)
GFR, EST AFRICAN AMERICAN: 6 mL/min — AB (ref >60)
GFR, EST NON AFRICAN AMERICAN: 5 mL/min — AB (ref >60)
Glucose, Bld: 84 mg/dL (ref 65–99)
Potassium: 7.4 mmol/L (ref 3.5–5.1)
Sodium: 130 mmol/L — ABNORMAL LOW (ref 135–145)
TOTAL PROTEIN: 8 g/dL (ref 6.5–8.1)

## 2015-10-07 LAB — TROPONIN I: TROPONIN I: 0.07 ng/mL — AB (ref <0.03)

## 2015-10-07 MED ORDER — NITROGLYCERIN 0.4 MG SL SUBL: 0.4000 mg | SUBLINGUAL_TABLET | SUBLINGUAL | Status: DC | PRN

## 2015-10-07 MED ORDER — POLYETHYLENE GLYCOL 3350 17 G PO PACK: 17.0000 g | PACK | Freq: Every day | ORAL | Status: DC | PRN

## 2015-10-07 MED ORDER — METOPROLOL TARTRATE 25 MG PO TABS
12.5000 mg | ORAL_TABLET | Freq: Two times a day (BID) | ORAL | Status: DC
  Administered 2015-10-08 – 2015-10-09 (×4): 12.5 mg via ORAL
  Filled 2015-10-07 (×4): qty 1

## 2015-10-07 MED ORDER — CALCIUM ACETATE (PHOS BINDER) 667 MG PO CAPS
667.0000 mg | ORAL_CAPSULE | Freq: Every day | ORAL | Status: DC
  Administered 2015-10-08: 667 mg via ORAL
  Administered 2015-10-08 (×2): 1334 mg via ORAL
  Administered 2015-10-09: 667 mg via ORAL
  Filled 2015-10-07: qty 4
  Filled 2015-10-07: qty 2
  Filled 2015-10-07 (×2): qty 1

## 2015-10-07 MED ORDER — SODIUM CHLORIDE 0.9% FLUSH
3.0000 mL | Freq: Two times a day (BID) | INTRAVENOUS | Status: DC
  Administered 2015-10-08 – 2015-10-09 (×2): 3 mL via INTRAVENOUS

## 2015-10-07 MED ORDER — DEXTROSE 50 % IV SOLN
INTRAVENOUS | Status: AC
  Filled 2015-10-07: qty 50

## 2015-10-07 MED ORDER — CINACALCET HCL 30 MG PO TABS
60.0000 mg | ORAL_TABLET | Freq: Every day | ORAL | Status: DC
  Administered 2015-10-08 – 2015-10-09 (×2): 60 mg via ORAL
  Filled 2015-10-07 (×2): qty 2

## 2015-10-07 MED ORDER — CALCIUM ACETATE (PHOS BINDER) 667 MG PO CAPS: 444889.0000 mg | ORAL_CAPSULE | Freq: Every day | ORAL | Status: DC

## 2015-10-07 MED ORDER — ONDANSETRON HCL 4 MG PO TABS
4.0000 mg | ORAL_TABLET | Freq: Four times a day (QID) | ORAL | Status: DC | PRN
Start: 2015-10-07 — End: 2015-10-10

## 2015-10-07 MED ORDER — SODIUM POLYSTYRENE SULFONATE 15 GM/60ML PO SUSP: 30.0000 g | ORAL | Status: DC

## 2015-10-07 MED ORDER — INSULIN ASPART 100 UNIT/ML IV SOLN
10.0000 [IU] | Freq: Once | INTRAVENOUS | Status: AC
  Administered 2015-10-07: 10 [IU] via INTRAVENOUS
  Filled 2015-10-07: qty 0.1

## 2015-10-07 MED ORDER — ACETAMINOPHEN 650 MG RE SUPP: 650.0000 mg | Freq: Four times a day (QID) | RECTAL | Status: DC | PRN

## 2015-10-07 MED ORDER — SODIUM CHLORIDE 0.9% FLUSH: 3.0000 mL | INTRAVENOUS | Status: DC | PRN

## 2015-10-07 MED ORDER — DOCUSATE SODIUM 100 MG PO CAPS
100.0000 mg | ORAL_CAPSULE | Freq: Two times a day (BID) | ORAL | Status: DC
  Administered 2015-10-08 – 2015-10-09 (×4): 100 mg via ORAL
  Filled 2015-10-07 (×4): qty 1

## 2015-10-07 MED ORDER — SODIUM CHLORIDE 0.9% FLUSH
3.0000 mL | Freq: Two times a day (BID) | INTRAVENOUS | Status: DC
  Administered 2015-10-09 (×2): 3 mL via INTRAVENOUS

## 2015-10-07 MED ORDER — TIOTROPIUM BROMIDE MONOHYDRATE 18 MCG IN CAPS
18.0000 ug | ORAL_CAPSULE | Freq: Every day | RESPIRATORY_TRACT | Status: DC
  Administered 2015-10-08 – 2015-10-09 (×2): 18 ug via RESPIRATORY_TRACT
  Filled 2015-10-07: qty 5

## 2015-10-07 MED ORDER — FUROSEMIDE 40 MG PO TABS
80.0000 mg | ORAL_TABLET | ORAL | Status: DC
  Administered 2015-10-08: 80 mg via ORAL
  Filled 2015-10-07: qty 2

## 2015-10-07 MED ORDER — ISOSORBIDE MONONITRATE ER 30 MG PO TB24
30.0000 mg | ORAL_TABLET | Freq: Every day | ORAL | Status: DC
  Administered 2015-10-08 – 2015-10-09 (×2): 30 mg via ORAL
  Filled 2015-10-07 (×2): qty 1

## 2015-10-07 MED ORDER — AMLODIPINE BESYLATE 5 MG PO TABS
5.0000 mg | ORAL_TABLET | Freq: Every day | ORAL | Status: DC
  Administered 2015-10-08 – 2015-10-09 (×2): 5 mg via ORAL
  Filled 2015-10-07 (×2): qty 1

## 2015-10-07 MED ORDER — PANTOPRAZOLE SODIUM 40 MG PO TBEC
40.0000 mg | DELAYED_RELEASE_TABLET | Freq: Two times a day (BID) | ORAL | Status: DC
  Administered 2015-10-08 – 2015-10-09 (×4): 40 mg via ORAL
  Filled 2015-10-07 (×4): qty 1

## 2015-10-07 MED ORDER — DEXTROSE 50 % IV SOLN
1.0000 | Freq: Once | INTRAVENOUS | Status: AC
  Administered 2015-10-07: 50 mL via INTRAVENOUS

## 2015-10-07 MED ORDER — HEPARIN SODIUM (PORCINE) 5000 UNIT/ML IJ SOLN: 5000.0000 [IU] | Freq: Three times a day (TID) | INTRAMUSCULAR | Status: DC

## 2015-10-07 MED ORDER — AMIODARONE HCL 200 MG PO TABS
200.0000 mg | ORAL_TABLET | Freq: Every day | ORAL | Status: DC
  Administered 2015-10-08 – 2015-10-09 (×2): 200 mg via ORAL
  Filled 2015-10-07 (×2): qty 1

## 2015-10-07 MED ORDER — INSULIN ASPART 100 UNIT/ML ~~LOC~~ SOLN: 6.0000 [IU] | Freq: Once | SUBCUTANEOUS | Status: DC

## 2015-10-07 MED ORDER — IPRATROPIUM-ALBUTEROL 0.5-2.5 (3) MG/3ML IN SOLN: 3.0000 mL | RESPIRATORY_TRACT | Status: DC | PRN

## 2015-10-07 MED ORDER — CLOPIDOGREL BISULFATE 75 MG PO TABS: 75.0000 mg | ORAL_TABLET | Freq: Every day | ORAL | Status: DC

## 2015-10-07 MED ORDER — SODIUM CHLORIDE 0.9 % IV SOLN
1.0000 g | Freq: Once | INTRAVENOUS | Status: AC
  Administered 2015-10-07: 1 g via INTRAVENOUS
  Filled 2015-10-07: qty 10

## 2015-10-07 MED ORDER — ALBUTEROL SULFATE (2.5 MG/3ML) 0.083% IN NEBU: 2.5000 mg | INHALATION_SOLUTION | RESPIRATORY_TRACT | Status: DC | PRN

## 2015-10-07 MED ORDER — DEXTROSE 50 % IV SOLN
25.0000 mL | Freq: Once | INTRAVENOUS | Status: AC
  Administered 2015-10-07: 25 mL via INTRAVENOUS
  Filled 2015-10-07: qty 50

## 2015-10-07 MED ORDER — ACETAMINOPHEN 325 MG PO TABS: 650.0000 mg | ORAL_TABLET | Freq: Four times a day (QID) | ORAL | Status: DC | PRN

## 2015-10-07 MED ORDER — METOPROLOL TARTRATE 25 MG PO TABS: 12.5000 mg | ORAL_TABLET | Freq: Two times a day (BID) | ORAL | Status: DC

## 2015-10-07 MED ORDER — ONDANSETRON HCL 4 MG/2ML IJ SOLN: 4.0000 mg | Freq: Four times a day (QID) | INTRAMUSCULAR | Status: DC | PRN

## 2015-10-07 MED ORDER — MOMETASONE FURO-FORMOTEROL FUM 200-5 MCG/ACT IN AERO
2.0000 | INHALATION_SPRAY | Freq: Two times a day (BID) | RESPIRATORY_TRACT | Status: DC
  Administered 2015-10-08 – 2015-10-09 (×4): 2 via RESPIRATORY_TRACT
  Filled 2015-10-07: qty 8.8

## 2015-10-07 MED ORDER — SODIUM CHLORIDE 0.9 % IV SOLN: 250.0000 mL | INTRAVENOUS | Status: DC | PRN

## 2015-10-07 NOTE — Progress Notes (Signed)
Start of hd 

## 2015-10-07 NOTE — Progress Notes (Signed)
Pre hd 

## 2015-10-07 NOTE — Progress Notes (Signed)
Pre hd assessment  

## 2015-10-07 NOTE — H&P (Signed)
[facility] [place] NAME: Kelsey Peterson    MR#:  [id number]  DATE OF BIRTH:  Jan 20, 1955  DATE OF ADMISSION:  10/07/2015  PRIMARY CARE PHYSICIAN: No PCP Per Patient   REQUESTING/REFERRING PHYSICIAN: Dr. Quentin Cornwall  CHIEF COMPLAINT:   Chief Complaint  Patient presents with  . Shortness of Breath    HISTORY OF PRESENT ILLNESS:  Kelsey Peterson  is a 60 y.o. 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 Patient is extremely tachypneic with conversational dyspnea at this time. Patient has not missed any dialysis. Poor historian. Has significant hearing loss. She has had no change in her diet or fluid intake recently. On blood work her potassium is 7.4. EKG shows prolonged PR interval.  PAST MEDICAL HISTORY:   Past Medical History:  Diagnosis Date  . Anemia of chronic disease   . CAD (coronary artery disease)   . Chronic systolic CHF (congestive heart failure) (HCC)    a. EF 45% by echo in 06/2014  . Cognitive impairment   . COPD (chronic obstructive pulmonary disease) ([place]) 07/14/2014  . ESRD on hemodialysis ([place])   . GERD (gastroesophageal reflux disease) 07/14/2014  . Hepatitis C   . HOH (hard of hearing)   . HTN (hypertension)   . Migraine with aura   . Mitral regurgitation    a. calcified mitral annulus, mod MR by echo in 06/2014  . Paroxysmal SVT (supraventricular tachycardia) ([place])    a. history of this, with last known occurrence in 06/2014  . Secondary hyperparathyroidism of renal origin [facility])     PAST SURGICAL HISTORY:   Past Surgical History:  Procedure Laterality Date  . COLONOSCOPY N/A 07/15/2014   Procedure: COLONOSCOPY;  Surgeon: Manya Silvas, MD;  Location: [facility] ENDOSCOPY;  Service: Endoscopy;  Laterality: N/A;  .  COLONOSCOPY WITH PROPOFOL Left 07/09/2014   Procedure: COLONOSCOPY WITH PROPOFOL;  Surgeon: Hulen Luster, MD;  Location: [facility] ENDOSCOPY;  Service: Endoscopy;  Laterality: Left;  . DIALYSIS FISTULA CREATION    . ESOPHAGOGASTRODUODENOSCOPY N/A 04/22/2015   Procedure: ESOPHAGOGASTRODUODENOSCOPY (EGD);  Surgeon: Manya Silvas, MD;  Location: [facility] ENDOSCOPY;  Service: Endoscopy;  Laterality: N/A;  . TUBAL LIGATION      SOCIAL HISTORY:   Social History  Substance Use Topics  . Smoking status: Never Smoker  . Smokeless tobacco: Never Used  . Alcohol use No    FAMILY HISTORY:   Family History  Problem Relation Age of Onset  . Heart disease Mother   . Hypertension Mother     DRUG ALLERGIES:   Allergies  Allergen Reactions  . Contrast Media [Iodinated Diagnostic Agents] Other (See Comments)    Unknown reaction  . Morphine And Related Hives  . Other Rash    Blood Pressure Pill (Pt doesn't remember name)     REVIEW OF SYSTEMS:   Review of Systems  Constitutional: Positive for malaise/fatigue. Negative for chills, fever and weight loss.  HENT: Negative for hearing loss and nosebleeds.   Eyes: Negative for blurred vision, double vision and pain.  Respiratory: Positive for cough and shortness of breath. Negative for hemoptysis, sputum production and wheezing.   Cardiovascular: Positive for chest pain and orthopnea. Negative for palpitations and leg swelling.  Gastrointestinal: Negative for abdominal pain, constipation, diarrhea, nausea and vomiting.  Genitourinary: Negative for dysuria and  hematuria.  Musculoskeletal: Negative for back pain, falls and myalgias.  Skin: Negative for rash.  Neurological: Positive for weakness. Negative for dizziness, tremors, sensory change, speech change, focal weakness, seizures and headaches.  Endo/Heme/Allergies: Does not bruise/bleed easily.  Psychiatric/Behavioral: Negative for depression and memory loss. The patient is not nervous/anxious.      MEDICATIONS AT HOME:   Prior to Admission medications   Medication Sig Start Date End Date Taking? Authorizing Provider  albuterol (PROVENTIL HFA;VENTOLIN HFA) 108 (90 Base) MCG/ACT inhaler Inhale 2 puffs into the lungs every 6 (six) hours as needed for wheezing or shortness of breath. 04/04/15  Yes Shallon Yaklin, MD  amiodarone (PACERONE) 200 MG tablet Take 1 tablet (200 mg total) by mouth daily. 04/04/15  Yes Shaquanna Lycan, MD  amLODipine (NORVASC) 5 MG tablet Take 5 mg by mouth daily.   Yes Historical Provider, MD  calcium acetate (PHOSLO) 667 MG capsule Take [phone] capsules by mouth 5 (five) times daily. 1334 mg 3 times daily with meals and 667 mg 2 times daily with snacks   Yes Historical Provider, MD  cinacalcet (SENSIPAR) 60 MG tablet Take 60 mg by mouth daily.   Yes Historical Provider, MD  clopidogrel (PLAVIX) 75 MG tablet Take 75 mg by mouth daily. 08/31/14  Yes Historical Provider, MD  Fluticasone-Salmeterol (ADVAIR DISKUS) 250-50 MCG/DOSE AEPB Inhale 1 puff into the lungs 2 (two) times daily. 04/04/15  Yes Eidan Muellner, MD  folic acid-vitamin b complex-vitamin c-selenium-zinc (DIALYVITE) 3 MG TABS tablet Take 1 tablet by mouth daily.   Yes Historical Provider, MD  furosemide (LASIX) 80 MG tablet Take 80 mg by mouth every Tuesday, Thursday, Saturday, and Sunday.    Yes Historical Provider, MD  ipratropium-albuterol (DUONEB) 0.5-2.5 (3) MG/3ML SOLN Take 3 mLs by nebulization every 4 (four) hours as needed. Patient taking differently: Take 3 mLs by nebulization every 4 (four) hours as needed. For wheezing/shortness of breath 04/23/15  Yes Vaughan Basta, MD  isosorbide mononitrate (IMDUR) 30 MG 24 hr tablet Take 30 mg by mouth daily.   Yes Historical Provider, MD  metoprolol tartrate (LOPRESSOR) 25 MG tablet Take 12.5 mg by mouth 2 (two) times daily. 10/21/14  Yes Historical Provider, MD  ondansetron (ZOFRAN) 4 MG tablet Take 4 mg by mouth every 8 (eight) hours as needed for  nausea or vomiting. Reported on 02/05/2015   Yes Historical Provider, MD  pantoprazole (PROTONIX) 40 MG tablet Take 1 tablet (40 mg total) by mouth 2 (two) times daily. 04/23/15  Yes Vaughan Basta, MD  tiotropium (SPIRIVA) 18 MCG inhalation capsule Place 18 mcg into inhaler and inhale daily.   Yes Historical Provider, MD     VITAL SIGNS:  Blood pressure (!) 166/111, pulse 80, temperature 98.2 F (36.8 C), temperature source Oral, resp. rate 18, height 5\' 2"  (1.575 m), weight 80.7 kg (178 lb), SpO2 98 %.  PHYSICAL EXAMINATION:  Physical Exam  GENERAL:  60 y.o.-year-old patient lying in the bed with Shortness of breath. Decreased hearing EYES: Pupils equal, round, reactive to light and accommodation. No scleral icterus. Extraocular muscles intact.  HEENT: Head atraumatic, normocephalic. Oropharynx and nasopharynx clear. No oropharyngeal erythema, moist oral mucosa  NECK:  Supple, no jugular venous distention. No thyroid enlargement, no tenderness.  LUNGS: Increased work of breathing with bilateral crackles. CARDIOVASCULAR: S1, S2 normal. No murmurs, rubs, or gallops.  ABDOMEN: Soft, nontender, nondistended. Bowel sounds present. No organomegaly or mass.  EXTREMITIES: No pedal edema, cyanosis, or clubbing. + 2 pedal & radial pulses  b/l.   NEUROLOGIC: Cranial nerves II through XII are intact. No focal Motor or sensory deficits appreciated b/l PSYCHIATRIC: The patient is alert and oriented x 3. Anxious SKIN: No obvious rash, lesion, or ulcer.   LABORATORY PANEL:   CBC  Recent Labs Lab 10/07/15 1808  WBC 4.8  HGB 8.6*  HCT 26.3*  PLT 175   ------------------------------------------------------------------------------------------------------------------  Chemistries   Recent Labs Lab 10/07/15 1808  NA 130*  K 7.4*  CL 92*  CO2 29  GLUCOSE 84  BUN 55*  CREATININE 7.75*  CALCIUM 9.5  AST 38  ALT 22  ALKPHOS 109  BILITOT 0.6    ------------------------------------------------------------------------------------------------------------------  Cardiac Enzymes  Recent Labs Lab 10/07/15 1808  TROPONINI 0.07*   ------------------------------------------------------------------------------------------------------------------  RADIOLOGY:  Dg Chest 2 View  Result Date: 10/07/2015 CLINICAL DATA:  Shortness of breath, chest pain, possible congestive heart failure EXAM: CHEST  2 VIEW COMPARISON:  07/29/2015 FINDINGS: Cardiomegaly. Central mild vascular congestion and mild perihilar interstitial prominence suspicious for mild interstitial edema. Small left pleural effusion with left basilar atelectasis on infiltrate. IMPRESSION: Central mild vascular congestion and mild perihilar interstitial prominence suspicious for mild interstitial edema. Small left pleural effusion with left basilar atelectasis on infiltrate. Electronically Signed   By: Lahoma Crocker M.D.   On: 10/07/2015 18:49     IMPRESSION AND PLAN:   * Acute on chronic systolic CHF with acute on chronic respiratory failure Etiology unclear. Patient will need emergent dialysis as she has end-stage renal disease. Case has been discussed with nephrology. Monitor input and output.  * Hyperkalemia with end-stage renal disease Calcium gluconate dose given in the emergency room. We will order insulin plus glucose. Patient is getting dialysis soon.  * Hypertension Continue home medications  * Anemia of chronic disease is stable  * DVT prophylaxis with heparin  Patient is critically ill  All the records are reviewed and case discussed with ED provider. Management plans discussed with the patient, family and they are in agreement.  CODE STATUS: Full code  TOTAL CC TIME TAKING CARE OF THIS PATIENT: 40 minutes.   Hillary Bow R M.D on 10/07/2015 at 7:23 PM  Between 7am to 6pm - Pager - [phone]  After 6pm go to [email or web address] - password [email or web address]  [facility]  [place] [facility]  Office  [phone]  CC: Primary care physician; No PCP Per Patient  Note: This dictation was prepared with Dragon dictation along with smaller phrase technology. Any transcriptional errors that result from this process are unintentional.

## 2015-10-07 NOTE — ED Notes (Addendum)
k 7.4  MD reports to repeat prior to administering insulin

## 2015-10-07 NOTE — ED Provider Notes (Signed)
[facility] Emergency Department Provider Note    First MD Initiated Contact with Patient 10/07/15 1725     (approximate)  I have reviewed the triage vital signs and the nursing notes.   HISTORY  Chief Complaint Shortness of Breath    HPI Kelsey Peterson is a 60 y.o. female with history of end-stage renal disease and severe COPD as well as none CAD and CHF presents with 1 day of worsening shortness of breath of midsternal chest pain. Patient is a Tuesday Thursday Saturday dialysis patient did not miss any dialysis. Patient states that she's been using her nebulizer treatments at home without improvement. States that it does hurt to take a deep breath. The pain does not radiate.  Dull in nature.  She currently rates it as a 5 out of 10 in severity. Denies any nausea vomiting or fevers.  Past Medical History:  Diagnosis Date  . Anemia of chronic disease   . CAD (coronary artery disease)   . Chronic systolic CHF (congestive heart failure) (HCC)    a. EF 45% by echo in 06/2014  . Cognitive impairment   . COPD (chronic obstructive pulmonary disease) ([place]) 07/14/2014  . ESRD on hemodialysis ([place])   . GERD (gastroesophageal reflux disease) 07/14/2014  . Hepatitis C   . HOH (hard of hearing)   . HTN (hypertension)   . Migraine with aura   . Mitral regurgitation    a. calcified mitral annulus, mod MR by echo in 06/2014  . Paroxysmal SVT (supraventricular tachycardia) ([place])    a. history of this, with last known occurrence in 06/2014  . Secondary hyperparathyroidism of renal origin [facility])     Patient Active Problem List   Diagnosis Date Noted  . CHF (congestive heart failure) ([place]) 10/07/2015  . Hypoglycemia 04/18/2015  . Elevated troponin 04/18/2015  . ESRD on hemodialysis ([place]) 04/18/2015  . Uncontrolled hypertension 04/18/2015  . Acute respiratory failure with hypoxia ([place]) 04/02/2015  . Pleural effusion, left 04/02/2015  . Extreme hearing loss  03/26/2015  . Acute on chronic systolic heart failure ([place]) 02/28/2015  . Cholelithiasis 12/01/2014  . Hyperkalemia 09/01/2014  . Fluid overload 09/01/2014  . GERD (gastroesophageal reflux disease) 07/14/2014  . COPD (chronic obstructive pulmonary disease) ([place]) 07/14/2014  . HTN (hypertension) 07/14/2014  . CAD (coronary artery disease) 07/14/2014  . End stage renal disease on dialysis ([place])   . Supraventricular tachycardia ([place])   . NSVT (nonsustained ventricular tachycardia) ([place])   . Anemia 07/03/2014    Past Surgical History:  Procedure Laterality Date  . COLONOSCOPY N/A 07/15/2014   Procedure: COLONOSCOPY;  Surgeon: Manya Silvas, MD;  Location: [facility] ENDOSCOPY;  Service: Endoscopy;  Laterality: N/A;  . COLONOSCOPY WITH PROPOFOL Left 07/09/2014   Procedure: COLONOSCOPY WITH PROPOFOL;  Surgeon: Hulen Luster, MD;  Location: [facility] ENDOSCOPY;  Service: Endoscopy;  Laterality: Left;  . DIALYSIS FISTULA CREATION    . ESOPHAGOGASTRODUODENOSCOPY N/A 04/22/2015   Procedure: ESOPHAGOGASTRODUODENOSCOPY (EGD);  Surgeon: Manya Silvas, MD;  Location: [facility] ENDOSCOPY;  Service: Endoscopy;  Laterality: N/A;  . TUBAL LIGATION      Prior to Admission medications   Medication Sig Start Date End Date Taking? Authorizing Provider  albuterol (PROVENTIL HFA;VENTOLIN HFA) 108 (90 Base) MCG/ACT inhaler Inhale 2 puffs into the lungs every 6 (six) hours as needed for wheezing or shortness of breath. 04/04/15  Yes Srikar Sudini, MD  amiodarone (PACERONE) 200 MG tablet Take 1 tablet (200 mg total) by mouth daily.  04/04/15  Yes Srikar Sudini, MD  amLODipine (NORVASC) 5 MG tablet Take 5 mg by mouth daily.   Yes Historical Provider, MD  calcium acetate (PHOSLO) 667 MG capsule Take [phone] capsules by mouth 5 (five) times daily. 1334 mg 3 times daily with meals and 667 mg 2 times daily with snacks   Yes Historical Provider, MD  cinacalcet (SENSIPAR) 60 MG tablet Take 60 mg by mouth daily.   Yes Historical  Provider, MD  clopidogrel (PLAVIX) 75 MG tablet Take 75 mg by mouth daily. 08/31/14  Yes Historical Provider, MD  Fluticasone-Salmeterol (ADVAIR DISKUS) 250-50 MCG/DOSE AEPB Inhale 1 puff into the lungs 2 (two) times daily. 04/04/15  Yes Srikar Sudini, MD  folic acid-vitamin b complex-vitamin c-selenium-zinc (DIALYVITE) 3 MG TABS tablet Take 1 tablet by mouth daily.   Yes Historical Provider, MD  furosemide (LASIX) 80 MG tablet Take 80 mg by mouth every Tuesday, Thursday, Saturday, and Sunday.    Yes Historical Provider, MD  ipratropium-albuterol (DUONEB) 0.5-2.5 (3) MG/3ML SOLN Take 3 mLs by nebulization every 4 (four) hours as needed. Patient taking differently: Take 3 mLs by nebulization every 4 (four) hours as needed. For wheezing/shortness of breath 04/23/15  Yes Vaughan Basta, MD  isosorbide mononitrate (IMDUR) 30 MG 24 hr tablet Take 30 mg by mouth daily.   Yes Historical Provider, MD  metoprolol tartrate (LOPRESSOR) 25 MG tablet Take 12.5 mg by mouth 2 (two) times daily. 10/21/14  Yes Historical Provider, MD  ondansetron (ZOFRAN) 4 MG tablet Take 4 mg by mouth every 8 (eight) hours as needed for nausea or vomiting. Reported on 02/05/2015   Yes Historical Provider, MD  pantoprazole (PROTONIX) 40 MG tablet Take 1 tablet (40 mg total) by mouth 2 (two) times daily. 04/23/15  Yes Vaughan Basta, MD  tiotropium (SPIRIVA) 18 MCG inhalation capsule Place 18 mcg into inhaler and inhale daily.   Yes Historical Provider, MD    Allergies Contrast media [iodinated diagnostic agents]; Morphine and related; and Other  Family History  Problem Relation Age of Onset  . Heart disease Mother   . Hypertension Mother     Social History Social History  Substance Use Topics  . Smoking status: Never Smoker  . Smokeless tobacco: Never Used  . Alcohol use No    Review of Systems Patient denies headaches, rhinorrhea, blurry vision, numbness, shortness of breath, chest pain, edema, cough, abdominal  pain, nausea, vomiting, diarrhea, dysuria, fevers, rashes or hallucinations unless otherwise stated above in HPI. ____________________________________________   PHYSICAL EXAM:  VITAL SIGNS: Vitals:   10/07/15 2330 10/08/15 0000  BP: (!) 158/73 (!) 151/66  Pulse: 63 65  Resp: 20 19  Temp:      Constitutional: Alert and oriented. Mild respiratory distress. Eyes: Conjunctivae are normal. PERRL. EOMI. Head: Atraumatic. Nose: No congestion/rhinnorhea. Mouth/Throat: Mucous membranes are moist.  Oropharynx non-erythematous. Neck: No stridor. Painless ROM. No cervical spine tenderness to palpation Hematological/Lymphatic/Immunilogical: No cervical lymphadenopathy. Cardiovascular: Normal rate, regular rhythm. Grossly normal heart sounds.  Good peripheral circulation. Respiratory: tachypnic.  No retractions. diminished air movement throughout. Gastrointestinal: Soft and nontender. No distention. No abdominal bruits. No CVA tenderness. Musculoskeletal: No lower extremity tenderness nor edema.  No joint effusions. Neurologic:  Normal speech and language. No gross focal neurologic deficits are appreciated. No gait instability. Skin:  Skin is warm, dry and intact. No rash noted. Psychiatric: Mood and affect are normal. Speech and behavior are normal.  ____________________________________________   LABS (all labs ordered are listed, but only abnormal  results are displayed)  Results for orders placed or performed during the hospital encounter of 10/07/15 (from the past 24 hour(s))  CBC with Differential/Platelet     Status: Abnormal   Collection Time: 10/07/15  6:08 PM  Result Value Ref Range   WBC 4.8 3.6 - 11.0 K/uL   RBC 3.37 (L) 3.80 - 5.20 MIL/uL   Hemoglobin 8.6 (L) 12.0 - 16.0 g/dL   HCT 26.3 (L) 35.0 - 47.0 %   MCV 78.1 (L) 80.0 - 100.0 fL   MCH 25.7 (L) 26.0 - 34.0 pg   MCHC 32.9 32.0 - 36.0 g/dL   RDW 22.6 (H) 11.5 - 14.5 %   Platelets 175 150 - 440 K/uL   Neutrophils  Relative % 67 %   Neutro Abs 3.3 1.4 - 6.5 K/uL   Lymphocytes Relative 15 %   Lymphs Abs 0.7 (L) 1.0 - 3.6 K/uL   Monocytes Relative 12 %   Monocytes Absolute 0.6 0.2 - 0.9 K/uL   Eosinophils Relative 5 %   Eosinophils Absolute 0.2 0 - 0.7 K/uL   Basophils Relative 1 %   Basophils Absolute 0.0 0 - 0.1 K/uL  Comprehensive metabolic panel     Status: Abnormal   Collection Time: 10/07/15  6:08 PM  Result Value Ref Range   Sodium 130 (L) 135 - 145 mmol/L   Potassium 7.4 (HH) 3.5 - 5.1 mmol/L   Chloride 92 (L) 101 - 111 mmol/L   CO2 29 22 - 32 mmol/L   Glucose, Bld 84 65 - 99 mg/dL   BUN 55 (H) 6 - 20 mg/dL   Creatinine, Ser 7.75 (H) 0.44 - 1.00 mg/dL   Calcium 9.5 8.9 - 10.3 mg/dL   Total Protein 8.0 6.5 - 8.1 g/dL   Albumin 3.7 3.5 - 5.0 g/dL   AST 38 15 - 41 U/L   ALT 22 14 - 54 U/L   Alkaline Phosphatase 109 38 - 126 U/L   Total Bilirubin 0.6 0.3 - 1.2 mg/dL   GFR calc non Af Amer 5 (L) >60 mL/min   GFR calc Af Amer 6 (L) >60 mL/min   Anion gap 9 5 - 15  Troponin I     Status: Abnormal   Collection Time: 10/07/15  6:08 PM  Result Value Ref Range   Troponin I 0.07 (HH) <0.03 ng/mL  Basic metabolic panel     Status: Abnormal   Collection Time: 10/07/15  7:39 PM  Result Value Ref Range   Sodium 131 (L) 135 - 145 mmol/L   Potassium 6.6 (HH) 3.5 - 5.1 mmol/L   Chloride 93 (L) 101 - 111 mmol/L   CO2 28 22 - 32 mmol/L   Glucose, Bld 86 65 - 99 mg/dL   BUN 57 (H) 6 - 20 mg/dL   Creatinine, Ser 8.03 (H) 0.44 - 1.00 mg/dL   Calcium 9.3 8.9 - 10.3 mg/dL   GFR calc non Af Amer 5 (L) >60 mL/min   GFR calc Af Amer 6 (L) >60 mL/min   Anion gap 10 5 - 15  Glucose, capillary     Status: Abnormal   Collection Time: 10/07/15 10:29 PM  Result Value Ref Range   Glucose-Capillary 11 (LL) 65 - 99 mg/dL   Comment 1 Notify RN   Glucose, capillary     Status: None   Collection Time: 10/07/15 10:53 PM  Result Value Ref Range   Glucose-Capillary 79 65 - 99 mg/dL    ____________________________________________  EKG My  review and personal interpretation at Time: 17:14   Indication: chest pain  Rate: 8  Rhythm: 80 Axis: normal Other: non specific st changes,no acute ischemia ____________________________________________  RADIOLOGY  CXR with interval worsening edema. ____________________________________________   PROCEDURES  Procedure(s) performed: none    Critical Care performed: yes CRITICAL CARE Performed by: Merlyn Lot   Total critical care time: 40 minutes  Critical care time was exclusive of separately billable procedures and treating other patients.  Critical care was necessary to treat or prevent imminent or life-threatening deterioration.  Critical care was time spent personally by me on the following activities: development of treatment plan with patient and/or surrogate as well as nursing, discussions with consultants, evaluation of patient's response to treatment, examination of patient, obtaining history from patient or surrogate, ordering and performing treatments and interventions, ordering and review of laboratory studies, ordering and review of radiographic studies, pulse oximetry and re-evaluation of patient's condition.  ____________________________________________   INITIAL IMPRESSION / ASSESSMENT AND PLAN / ED COURSE  Pertinent labs & imaging results that were available during my care of the patient were reviewed by me and considered in my medical decision making (see chart for details).  DDX: Asthma, copd, CHF, pna, ptx, malignancy, Pe, anemia   Kelsey Peterson is a 60 y.o. who presents to the ED with acute dyspnea and hypoxic respiratory failure. Patient's presentation is complicated due to multiple comorbidities including underlying COPD as well as CAD and CHF on end-stage renal disease. No evidence of acute ischemia on EKG. Chest x-ray ordered due to concern for worsening congestive heart failure flash  pulmonary edema. Will give nebulizer treatments for underlying COPD.  The patient will be placed on continuous pulse oximetry and telemetry for monitoring.  Laboratory evaluation will be sent to evaluate for the above complaints.     Clinical Course  Comment By Time  CXR with evidence of interval worsening edema Merlyn Lot, MD 09/18 1852  Troponin I baseline. No acute ischemia on EKG. Hemoglobin at baseline secondary to a chronic renal disease. She does have hyperkalemia but this is a hemolyzed specimen. We'll give IV calcium.  I sopke with Nephrology regarding her condition and given her apparent volume overload and hypoerkalemia will admit for dialysis and monitoring. Merlyn Lot, MD 09/18 1857     ____________________________________________   FINAL CLINICAL IMPRESSION(S) / ED DIAGNOSES  Final diagnoses:  Acute hyperkalemia  Dyspnea  Acute respiratory failure with hypoxia ([place])      NEW MEDICATIONS STARTED DURING THIS VISIT:  Current Discharge Medication List       Note:  This document was prepared using Dragon voice recognition software and may include unintentional dictation errors.    Merlyn Lot, MD 10/08/15 [phone]

## 2015-10-07 NOTE — ED Triage Notes (Signed)
Pt arrives to ER via [facility] c/o SOB. Pt received 1 duoneb en route, VSS. Also c/o chest pressure to center of chest.

## 2015-10-07 NOTE — Progress Notes (Signed)
Subjective:   Patient known to our practice from outpatient dialysis and previous admissions She called her dialysis nurse that she is not able to breath. Dialysis nurse called 911 to get her evaluated. In ER, her CXR shows congestion. She was hypoxic. She denies fever but has cough and sputum. Potassium is critically high therefore urgent HD is requested No Nausea or vomiting Patient reports b/l knee pain No Leg edema  Objective:  Vital signs in last 24 hours:  Temp:  [98.2 F (36.8 C)] 98.2 F (36.8 C) (09/18 1730) Pulse Rate:  [80] 80 (09/18 1730) Resp:  [18] 18 (09/18 1730) BP: (166)/(111) 166/111 (09/18 1730) SpO2:  [98 %] 98 % (09/18 1730) Weight:  [80.7 kg (178 lb)] 80.7 kg (178 lb) (09/18 1730)  Weight change:  Filed Weights   10/07/15 1730  Weight: 80.7 kg (178 lb)    Intake/Output:   No intake or output data in the 24 hours ending 10/07/15 2051   Physical Exam: General: NAD, laying in bed  HEENT Anicteric; moist mucus membranes  Neck supple  Pulm/lungs Coarse b/l, basilar crackles, worse on left  CVS/Heart Regular, no rub  Abdomen:  Soft, NT  Extremities: No edema  Neurologic: Alert, oreinted  Skin: No acute rashes  Access: AVF       Basic Metabolic Panel:   Recent Labs Lab 10/07/15 1808 10/07/15 1939  NA 130* 131*  K 7.4* 6.6*  CL 92* 93*  CO2 29 28  GLUCOSE 84 86  BUN 55* 57*  CREATININE 7.75* 8.03*  CALCIUM 9.5 9.3     CBC:  Recent Labs Lab 10/07/15 1808  WBC 4.8  NEUTROABS 3.3  HGB 8.6*  HCT 26.3*  MCV 78.1*  PLT 175      Microbiology:  No results found for this or any previous visit (from the past 720 hour(s)).  Coagulation Studies: No results for input(s): LABPROT, INR in the last 72 hours.  Urinalysis: No results for input(s): COLORURINE, LABSPEC, PHURINE, GLUCOSEU, HGBUR, BILIRUBINUR, KETONESUR, PROTEINUR, UROBILINOGEN, NITRITE, LEUKOCYTESUR in the last 72 hours.  Invalid input(s): APPERANCEUR     Imaging: Dg Chest 2 View  Result Date: 10/07/2015 CLINICAL DATA:  Shortness of breath, chest pain, possible congestive heart failure EXAM: CHEST  2 VIEW COMPARISON:  07/29/2015 FINDINGS: Cardiomegaly. Central mild vascular congestion and mild perihilar interstitial prominence suspicious for mild interstitial edema. Small left pleural effusion with left basilar atelectasis on infiltrate. IMPRESSION: Central mild vascular congestion and mild perihilar interstitial prominence suspicious for mild interstitial edema. Small left pleural effusion with left basilar atelectasis on infiltrate. Electronically Signed   By: Lahoma Crocker M.D.   On: 10/07/2015 18:49     Medications:   . sodium chloride    . calcium gluconate 1 GM IV     . amiodarone  200 mg Oral Daily  . [START ON 10/08/2015] amLODipine  5 mg Oral Daily  . calcium acetate  [phone] mg Oral 5 X Daily  . [START ON 10/08/2015] cinacalcet  60 mg Oral Daily  . [START ON 10/08/2015] clopidogrel  75 mg Oral Daily  . docusate sodium  100 mg Oral BID  . [START ON 10/08/2015] furosemide  80 mg Oral Q T,Th,S,Su  . heparin  5,000 Units Subcutaneous Q8H  . insulin aspart  6 Units Intravenous Once  . isosorbide mononitrate  30 mg Oral Daily  . [START ON 10/08/2015] metoprolol tartrate  12.5 mg Oral BID  . mometasone-formoterol  2 puff Inhalation BID  .  pantoprazole  40 mg Oral BID  . sodium chloride flush  3 mL Intravenous Q12H  . sodium chloride flush  3 mL Intravenous Q12H  . tiotropium  18 mcg Inhalation Daily   sodium chloride, acetaminophen **OR** acetaminophen, albuterol, ipratropium-albuterol, nitroGLYCERIN, ondansetron **OR** ondansetron (ZOFRAN) IV, polyethylene glycol, sodium chloride flush  Assessment/ Plan:  60 y.o.african [place] female with hypertension, anemia of chronic kidney disease, secondary hyperparathyroidism, history of meningitis as a child with resultant hearing loss  1. End Stage Renal Disease: TTS [facility] [place] With hyperkalemia on admission requiring emergent dialysis.   -urgent HD dialysis today with 1 K bath. Orders prepared.   2. Anemia of chronic kidney disease:  - epo with treatment TTS  3. Secondary Hyperparathyroidism:   - monitor phos during hospital stay - Cinacalcet - calcium acetate for binding.   4. Shortness of breath - chronic left pleural effusion - Acute pulmonary edema - Urgent HD for fluid removal   Case discussed with ER physician, Dialysis nurse and Dr Darvin Neighbours   LOS: 0 Murlean Iba 9/18/20178:51 PM

## 2015-10-08 ENCOUNTER — Encounter
Admission: EM | Disposition: A | Discharge status: Home / Self Care | Payer: Self-pay | Source: Home / Self Care | Attending: Internal Medicine

## 2015-10-08 HISTORY — PX: PERIPHERAL VASCULAR CATHETERIZATION: SHX172C

## 2015-10-08 LAB — BASIC METABOLIC PANEL
ANION GAP: 7 (ref 5–15)
BUN: 25 mg/dL — ABNORMAL HIGH (ref 6–20)
CALCIUM: 9 mg/dL (ref 8.9–10.3)
CHLORIDE: 97 mmol/L — AB (ref 101–111)
CO2: 32 mmol/L (ref 22–32)
Creatinine, Ser: 4.31 mg/dL — ABNORMAL HIGH (ref 0.44–1.00)
GFR calc Af Amer: 12 mL/min — ABNORMAL LOW (ref >60)
GFR calc non Af Amer: 10 mL/min — ABNORMAL LOW (ref >60)
GLUCOSE: 111 mg/dL — AB (ref 65–99)
Potassium: 4.1 mmol/L (ref 3.5–5.1)
Sodium: 136 mmol/L (ref 135–145)

## 2015-10-08 LAB — GLUCOSE, CAPILLARY
GLUCOSE-CAPILLARY: 82 mg/dL (ref 65–99)
GLUCOSE-CAPILLARY: 87 mg/dL (ref 65–99)
GLUCOSE-CAPILLARY: 86 mg/dL (ref 65–99)
GLUCOSE-CAPILLARY: 283 mg/dL — AB (ref 65–99)
Glucose-Capillary: 82 mg/dL (ref 65–99)

## 2015-10-08 LAB — POTASSIUM
POTASSIUM: 3.7 mmol/L (ref 3.5–5.1)
POTASSIUM: 4.3 mmol/L (ref 3.5–5.1)

## 2015-10-08 LAB — CBC
HEMATOCRIT: 23.8 % — AB (ref 35.0–47.0)
HEMOGLOBIN: 8.2 g/dL — AB (ref 12.0–16.0)
MCH: 26.3 pg (ref 26.0–34.0)
MCHC: 34.3 g/dL (ref 32.0–36.0)
MCV: 76.8 fL — AB (ref 80.0–100.0)
Platelets: 236 10*3/uL (ref 150–440)
RBC: 3.1 MIL/uL — AB (ref 3.80–5.20)
RDW: 22.7 % — ABNORMAL HIGH (ref 11.5–14.5)
WBC: 3.9 10*3/uL (ref 3.6–11.0)

## 2015-10-08 LAB — MRSA PCR SCREENING: MRSA by PCR: POSITIVE — AB

## 2015-10-08 SURGERY — A/V SHUNTOGRAM/FISTULAGRAM
Anesthesia: Moderate Sedation

## 2015-10-08 MED ORDER — CLOPIDOGREL BISULFATE 75 MG PO TABS
75.0000 mg | ORAL_TABLET | Freq: Every day | ORAL | Status: DC
  Administered 2015-10-09: 75 mg via ORAL
  Filled 2015-10-08: qty 1

## 2015-10-08 MED ORDER — CHLORHEXIDINE GLUCONATE CLOTH 2 % EX PADS: 6.0000 | MEDICATED_PAD | Freq: Every day | CUTANEOUS | Status: DC

## 2015-10-08 MED ORDER — MIDAZOLAM HCL 5 MG/5ML IJ SOLN
INTRAMUSCULAR | Status: AC
  Filled 2015-10-08: qty 5

## 2015-10-08 MED ORDER — DIPHENHYDRAMINE HCL 50 MG/ML IJ SOLN
INTRAMUSCULAR | Status: AC
  Filled 2015-10-08: qty 1

## 2015-10-08 MED ORDER — FAMOTIDINE 20 MG PO TABS
20.0000 mg | ORAL_TABLET | Freq: Once | ORAL | Status: AC
  Administered 2015-10-08: 20 mg via ORAL

## 2015-10-08 MED ORDER — LIDOCAINE HCL (PF) 1 % IJ SOLN
INTRAMUSCULAR | Status: AC
  Filled 2015-10-08: qty 30

## 2015-10-08 MED ORDER — IOPAMIDOL (ISOVUE-300) INJECTION 61%
INTRAVENOUS | Status: DC | PRN
  Administered 2015-10-08: 15 mL via INTRA_ARTERIAL

## 2015-10-08 MED ORDER — FENTANYL CITRATE (PF) 100 MCG/2ML IJ SOLN
INTRAMUSCULAR | Status: DC | PRN
  Administered 2015-10-08 (×2): 50 ug via INTRAVENOUS

## 2015-10-08 MED ORDER — MUPIROCIN 2 % EX OINT
1.0000 "application " | TOPICAL_OINTMENT | Freq: Two times a day (BID) | CUTANEOUS | Status: DC
  Administered 2015-10-09 (×2): 1 via NASAL
  Filled 2015-10-08: qty 22

## 2015-10-08 MED ORDER — MIDAZOLAM HCL 2 MG/2ML IJ SOLN
INTRAMUSCULAR | Status: DC | PRN
  Administered 2015-10-08 (×2): 2 mg via INTRAVENOUS

## 2015-10-08 MED ORDER — DEXTROSE 5 % IV SOLN
1.5000 g | Freq: Once | INTRAVENOUS | Status: AC
  Administered 2015-10-08: 1.5 g via INTRAVENOUS

## 2015-10-08 MED ORDER — DEXTROSE 10 % IV SOLN
INTRAVENOUS | Status: DC
  Administered 2015-10-08: 12:00:00 via INTRAVENOUS

## 2015-10-08 MED ORDER — METHYLPREDNISOLONE SODIUM SUCC 125 MG IJ SOLR
125.0000 mg | Freq: Once | INTRAMUSCULAR | Status: AC
  Administered 2015-10-08: 125 mg via INTRAVENOUS

## 2015-10-08 MED ORDER — FENTANYL CITRATE (PF) 100 MCG/2ML IJ SOLN
INTRAMUSCULAR | Status: AC
  Filled 2015-10-08: qty 2

## 2015-10-08 MED ORDER — HEPARIN SODIUM (PORCINE) 1000 UNIT/ML IJ SOLN
INTRAMUSCULAR | Status: AC
  Filled 2015-10-08: qty 1

## 2015-10-08 MED ORDER — GELATIN ABSORBABLE 12-7 MM EX MISC
1.0000 | Freq: Once | CUTANEOUS | Status: AC
  Administered 2015-10-08: 1 via TOPICAL
  Filled 2015-10-08: qty 1

## 2015-10-08 MED ORDER — FAMOTIDINE 20 MG PO TABS
ORAL_TABLET | ORAL | Status: AC
  Administered 2015-10-08: 20 mg via ORAL
  Filled 2015-10-08: qty 1

## 2015-10-08 SURGICAL SUPPLY — 5 items
DRAPE BRACHIAL (DRAPES) ×3 IMPLANT
PACK ANGIOGRAPHY (CUSTOM PROCEDURE TRAY) ×3 IMPLANT
SET INTRO CAPELLA COAXIAL (SET/KITS/TRAYS/PACK) ×3 IMPLANT
SHEATH BRITE TIP 6FRX5.5 (SHEATH) ×3 IMPLANT
TOWEL OR 17X26 4PK STRL BLUE (TOWEL DISPOSABLE) ×3 IMPLANT

## 2015-10-08 NOTE — Progress Notes (Signed)
Patient ID: Kelsey Peterson, female   DOB: 1955/01/22, 60 y.o.   MRN: [id number]  [facility] PROGRESS NOTE  Kelsey Peterson [id number] DOB: 1955/05/22 DOA: 10/07/2015 PCP: No PCP Per Patient  HPI/Subjective: Patient had prolonged bleeding last night after dialysis. Dialysis needed to be done secondary to acute on chronic systolic congestive heart failure and acute on chronic respiratory failure and hyperkalemia. Patient asked me when she can go home. With a complaint of chest pain and shortness of breath.  Objective: Vitals:   10/08/15 1545 10/08/15 1616  BP: (!) 129/98 (!) 137/93  Pulse: 60 70  Resp: 19 16  Temp:      Filed Weights   10/07/15 2330 10/08/15 0300 10/08/15 0333  Weight: 74 kg (163 lb 2.3 oz) 72 kg (158 lb 11.7 oz) 72.2 kg (159 lb 2.8 oz)    ROS: Review of Systems  Constitutional: Negative for chills and fever.  Eyes: Negative for blurred vision.  Respiratory: Positive for cough and shortness of breath.   Cardiovascular: Positive for chest pain.  Gastrointestinal: Negative for abdominal pain, constipation, diarrhea, nausea and vomiting.  Genitourinary: Negative for dysuria.  Musculoskeletal: Negative for joint pain.  Neurological: Negative for dizziness and headaches.   Exam: Physical Exam  Constitutional: She is oriented to person, place, and time.  HENT:  Nose: No mucosal edema.  Mouth/Throat: No oropharyngeal exudate or posterior oropharyngeal edema.  Eyes: Conjunctivae, EOM and lids are normal. Pupils are equal, round, and reactive to light.  Neck: No JVD present. Carotid bruit is not present. No edema present. No thyroid mass and no thyromegaly present.  Cardiovascular: S1 normal and S2 normal.  Exam reveals no gallop.   Murmur heard.  Systolic murmur is present with a grade of 2/6  Pulses:      Dorsalis pedis pulses are 2+ on the right side, and 2+ on the left side.  Chest pain reproducible to palpation  Respiratory: No respiratory  distress. She has decreased breath sounds in the right lower field and the left lower field. She has no wheezes. She has no rhonchi. She has rales in the right lower field and the left lower field.  GI: Soft. Bowel sounds are normal. There is no tenderness.  Musculoskeletal:       Right ankle: She exhibits no swelling.       Left ankle: She exhibits no swelling.  Lymphadenopathy:    She has no cervical adenopathy.  Neurological: She is alert and oriented to person, place, and time. No cranial nerve deficit.  Skin: Skin is warm. No rash noted. Nails show no clubbing.  Psychiatric: She has a normal mood and affect.      Data Reviewed: Basic Metabolic Panel:  Recent Labs Lab 10/07/15 0034 10/07/15 1808 10/07/15 1939 10/08/15 0134 10/08/15 0432  NA  --  130* 131*  --  136  K 4.3 7.4* 6.6* 3.7 4.1  CL  --  92* 93*  --  97*  CO2  --  29 28  --  32  GLUCOSE  --  84 86  --  111*  BUN  --  55* 57*  --  25*  CREATININE  --  7.75* 8.03*  --  4.31*  CALCIUM  --  9.5 9.3  --  9.0   Liver Function Tests:  Recent Labs Lab 10/07/15 1808  AST 38  ALT 22  ALKPHOS 109  BILITOT 0.6  PROT 8.0  ALBUMIN 3.7   CBC:  Recent Labs  Lab 10/07/15 1808 10/08/15 0432  WBC 4.8 3.9  NEUTROABS 3.3  --   HGB 8.6* 8.2*  HCT 26.3* 23.8*  MCV 78.1* 76.8*  PLT 175 236   Cardiac Enzymes:  Recent Labs Lab 10/07/15 1808  TROPONINI 0.07*   BNP (last 3 results)  Recent Labs  02/05/15 0647 04/18/15 1856  BNP >4,500.0* >4,500.0*     CBG:  Recent Labs Lab 10/07/15 2229 10/07/15 2253 10/08/15 0746 10/08/15 1156 10/08/15 1421  GLUCAP 11* 79 82 87 82      Studies: Dg Chest 2 View  Result Date: 10/07/2015 CLINICAL DATA:  Shortness of breath, chest pain, possible congestive heart failure EXAM: CHEST  2 VIEW COMPARISON:  07/29/2015 FINDINGS: Cardiomegaly. Central mild vascular congestion and mild perihilar interstitial prominence suspicious for mild interstitial edema. Small  left pleural effusion with left basilar atelectasis on infiltrate. IMPRESSION: Central mild vascular congestion and mild perihilar interstitial prominence suspicious for mild interstitial edema. Small left pleural effusion with left basilar atelectasis on infiltrate. Electronically Signed   By: Lahoma Crocker M.D.   On: 10/07/2015 18:49    Scheduled Meds: . [MAR Hold] amiodarone  200 mg Oral Daily  . [MAR Hold] amLODipine  5 mg Oral Daily  . [MAR Hold] calcium acetate  667-1,334 mg Oral 5 X Daily  . [MAR Hold] cinacalcet  60 mg Oral Daily  . [MAR Hold] clopidogrel  75 mg Oral Daily  . [MAR Hold] docusate sodium  100 mg Oral BID  . [MAR Hold] furosemide  80 mg Oral Q T,Th,S,Su  . [MAR Hold] insulin aspart  6 Units Intravenous Once  . [MAR Hold] isosorbide mononitrate  30 mg Oral Daily  . [MAR Hold] metoprolol tartrate  12.5 mg Oral BID  . [MAR Hold] mometasone-formoterol  2 puff Inhalation BID  . [MAR Hold] pantoprazole  40 mg Oral BID  . [MAR Hold] sodium chloride flush  3 mL Intravenous Q12H  . [MAR Hold] sodium chloride flush  3 mL Intravenous Q12H  . [MAR Hold] tiotropium  18 mcg Inhalation Daily   Continuous Infusions: . dextrose 30 mL/hr at 10/08/15 1214    Assessment/Plan:  1. Acute on chronic systolic congestive heart failure with moderate mitral regurgitation. May be because dialysis was not filtering the blood as well with the access. 2. Acute on chronic respiratory failure continue oxygen supplementation 3. Hypoglycemia last night. Could've been secondary to the treatment with insulin for the hyperkalemia. Patient on low dose dextrose drip right now. 4. Bleeding from dialysis access. Vascular surgery did an angiogram today. 5. Severe hyperkalemia on presentation. Treated with dialysis 6. History of SVT on amiodarone 7. End-stage renal disease on dialysis. Received dialysis late last night or early this morning. 8. Essential hypertension on Norvasc and metoprolol  Code Status:      Code Status Orders        Start     Ordered   10/07/15 1921  Full code  Continuous     10/07/15 1921    Code Status History    Date Active Date Inactive Code Status Order ID Comments User Context   07/29/2015  6:11 PM 07/31/2015  8:49 PM Full Code [id number]  Lytle Butte, MD ED   04/18/2015 11:33 PM 04/23/2015  8:26 PM Full Code [id number]  Sylvan Cheese, MD Inpatient   04/02/2015  3:21 AM 04/04/2015  8:25 PM Full Code [id number]  Lance Coon, MD Inpatient   02/05/2015  9:17 AM 02/08/2015  8:00 PM Full Code  [id number]  Fritzi Mandes, MD Inpatient   12/01/2014  3:08 PM 12/07/2014  7:15 PM Full Code [id number]  Demetrios Loll, MD Inpatient   09/01/2014  5:57 PM 09/06/2014  2:09 PM Full Code [id number]  Demetrios Loll, MD ED   07/14/2014  5:17 AM 07/16/2014  6:40 PM Full Code [id number]  Lance Coon, MD Inpatient   07/03/2014  2:06 PM 07/10/2014  7:11 PM Full Code [id number]  Demetrios Loll, MD Inpatient   06/21/2014  1:35 PM 06/22/2014  4:33 PM Full Code [id number]  Max Sane, MD Inpatient     Disposition Plan: Will reassess tomorrow for potential disposition.  Consultants:  Vascular surgery  Nephrology  Time spent: 25 minutes  [place], [place]

## 2015-10-08 NOTE — Progress Notes (Signed)
Patient returned from dialysis. Vitals signs stable. Minimal blood drainage on Av dressing. Patient called about five minutes later stating " I'm bleeding". Manual pressure held for over an hour with no relief. MD notified after about 30 minutes. Dialysis nurse called to floor. Assessed. Healed pressure and redressed site. Patient made NPO. Will continue to monitor.

## 2015-10-08 NOTE — Progress Notes (Signed)
Subjective:   Patient known to our practice from outpatient dialysis and previous admissions She called her dialysis nurse that she is not able to breath. Dialysis nurse called 911 to get her evaluated. In ER, her CXR shows congestion. She was hypoxic. She denies fever but has cough and sputum. Potassium is critically high therefore urgent HD is requested  3000 cc removed with HD overnight Post HS patient had significantly bleeding from access site. Prolonged pressure had to be applied scehduled for angiogram today   Objective:  Vital signs in last 24 hours:  Temp:  [97.4 F (36.3 C)-98.2 F (36.8 C)] 97.4 F (36.3 C) (09/19 1358) Pulse Rate:  [63-103] 66 (09/19 1358) Resp:  [14-20] 14 (09/19 1358) BP: (121-170)/(60-111) 134/83 (09/19 1358) SpO2:  [98 %-100 %] 98 % (09/19 1358) Weight:  [71.8 kg (158 lb 6.4 oz)-80.7 kg (178 lb)] 72.2 kg (159 lb 2.8 oz) (09/19 0333)  Weight change:  Filed Weights   10/07/15 2330 10/08/15 0300 10/08/15 0333  Weight: 74 kg (163 lb 2.3 oz) 72 kg (158 lb 11.7 oz) 72.2 kg (159 lb 2.8 oz)    Intake/Output:    Intake/Output Summary (Last 24 hours) at 10/08/15 1406 Last data filed at 10/08/15 0300  Gross per 24 hour  Intake                0 ml  Output             3000 ml  Net            -3000 ml     Physical Exam: General: NAD, laying in bed  HEENT Anicteric; moist mucus membranes  Neck supple  Pulm/lungs Clear b/l  CVS/Heart Regular, no rub  Abdomen:  Soft, NT  Extremities: No edema  Neurologic: Alert, oreinted  Skin: No acute rashes  Access: AVF       Basic Metabolic Panel:   Recent Labs Lab 10/07/15 0034 10/07/15 1808 10/07/15 1939 10/08/15 0134 10/08/15 0432  NA  --  130* 131*  --  136  K 4.3 7.4* 6.6* 3.7 4.1  CL  --  92* 93*  --  97*  CO2  --  29 28  --  32  GLUCOSE  --  84 86  --  111*  BUN  --  55* 57*  --  25*  CREATININE  --  7.75* 8.03*  --  4.31*  CALCIUM  --  9.5 9.3  --  9.0     CBC:  Recent  Labs Lab 10/07/15 1808 10/08/15 0432  WBC 4.8 3.9  NEUTROABS 3.3  --   HGB 8.6* 8.2*  HCT 26.3* 23.8*  MCV 78.1* 76.8*  PLT 175 236      Microbiology:  No results found for this or any previous visit (from the past 720 hour(s)).  Coagulation Studies: No results for input(s): LABPROT, INR in the last 72 hours.  Urinalysis: No results for input(s): COLORURINE, LABSPEC, PHURINE, GLUCOSEU, HGBUR, BILIRUBINUR, KETONESUR, PROTEINUR, UROBILINOGEN, NITRITE, LEUKOCYTESUR in the last 72 hours.  Invalid input(s): APPERANCEUR    Imaging: Dg Chest 2 View  Result Date: 10/07/2015 CLINICAL DATA:  Shortness of breath, chest pain, possible congestive heart failure EXAM: CHEST  2 VIEW COMPARISON:  07/29/2015 FINDINGS: Cardiomegaly. Central mild vascular congestion and mild perihilar interstitial prominence suspicious for mild interstitial edema. Small left pleural effusion with left basilar atelectasis on infiltrate. IMPRESSION: Central mild vascular congestion and mild perihilar interstitial prominence suspicious for mild interstitial edema.  Small left pleural effusion with left basilar atelectasis on infiltrate. Electronically Signed   By: Lahoma Crocker M.D.   On: 10/07/2015 18:49     Medications:   . dextrose 30 mL/hr at 10/08/15 1214   . [MAR Hold] amiodarone  200 mg Oral Daily  . [MAR Hold] amLODipine  5 mg Oral Daily  . [MAR Hold] calcium acetate  667-1,334 mg Oral 5 X Daily  . [MAR Hold] cinacalcet  60 mg Oral Daily  . [MAR Hold] clopidogrel  75 mg Oral Daily  . [MAR Hold] docusate sodium  100 mg Oral BID  . [MAR Hold] furosemide  80 mg Oral Q T,Th,S,Su  . [MAR Hold] insulin aspart  6 Units Intravenous Once  . [MAR Hold] isosorbide mononitrate  30 mg Oral Daily  . [MAR Hold] metoprolol tartrate  12.5 mg Oral BID  . [MAR Hold] mometasone-formoterol  2 puff Inhalation BID  . [MAR Hold] pantoprazole  40 mg Oral BID  . [MAR Hold] sodium chloride flush  3 mL Intravenous Q12H  . [MAR  Hold] sodium chloride flush  3 mL Intravenous Q12H  . [MAR Hold] tiotropium  18 mcg Inhalation Daily   [MAR Hold] sodium chloride, [MAR Hold] acetaminophen **OR** [MAR Hold] acetaminophen, [MAR Hold] albuterol, [MAR Hold] ipratropium-albuterol, [MAR Hold] nitroGLYCERIN, [MAR Hold] ondansetron **OR** [MAR Hold] ondansetron (ZOFRAN) IV, [MAR Hold] polyethylene glycol, [MAR Hold] sodium chloride flush  Assessment/ Plan:  60 y.o.african [place] female with hypertension, anemia of chronic kidney disease, secondary hyperparathyroidism, history of meningitis as a child with resultant hearing loss  1. End Stage Renal Disease: TTS [facility] Kelsey Peterson With hyperkalemia on admission requiring emergent dialysis.   -urgent HD dialysis done last night  2. Anemia of chronic kidney disease:  - epo with treatment TTS  3. Secondary Hyperparathyroidism:   - monitor phos during hospital stay - Cinacalcet - calcium acetate for binding.   4. Shortness of breath - chronic left pleural effusion - improved after 3000 cc removed with HD  5. Prolonged access bleeding - angiogram evaluation today    LOS: 1 Kelsey Peterson 9/19/20172:06 PM

## 2015-10-08 NOTE — Progress Notes (Signed)
Initial Nutrition Assessment  DOCUMENTATION CODES:   Not applicable  INTERVENTION:  -Pt declined ONS -Provide snacks as desired  NUTRITION DIAGNOSIS   Inadequate oral intake related to poor appetite, chronic illness as evidenced by per patient/family report.  GOAL:   Patient will meet greater than or equal to 90% of their needs  MONITOR:   PO intake, Supplement acceptance, I & O's, Labs, Weight trends  REASON FOR ASSESSMENT:   Malnutrition Screening Tool    ASSESSMENT:   Kelsey Peterson  is a 60 y.o. 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 Patient is extremely tachypneic with conversational dyspnea at this time.  Pt is hard of hearing, had to write things down to communicate with her. She endorses poor appetite and PO intake "all the time." She states when she feels hungry she eats, when she doesn't feel hungry she doesn't eat. She endorses wt loss from 213#-172#, per chart her weight has fluctuated dramatically, likely related to fluid accumulations. She is on HD, was in dialysis last night when she was found alert, but unable to speak or move, CBG of 11 upon assessment. This morning she complains that she is hungry, currently NPO for possible vascular surgery related to extensive bleeding from HD catheter site after surgery.  She does not like ONS, encouraged her to order snacks. It was difficult to assess her weight hx and her po intake. Labs and medications reviewed: Colace  Diet Order:  Diet NPO time specified  Skin:  Reviewed, no issues  Last BM:  PTA  Height:   Ht Readings from Last 1 Encounters:  10/07/15 5\' 2"  (1.575 m)    Weight:   Wt Readings from Last 1 Encounters:  10/08/15 159 lb 2.8 oz (72.2 kg)    Ideal Body Weight:  50 kg  BMI:  Body mass index is  29.11 kg/m.  Estimated Nutritional Needs:   Kcal:  1600-1900 calories  Protein:  72-87 gm  Fluid:  UOP + 1L  EDUCATION NEEDS:   Education needs addressed  [name]. Mikolaj Woolstenhulme, MS, RD LDN Inpatient Clinical Dietitian Pager [phone]

## 2015-10-08 NOTE — Progress Notes (Signed)
Patient found alert but unable to speak and having difficulty moving. Vitals assessed. Stable. CBG checked. Results 11. Given 1 ampule of D50. Rechecked. Results 79. Patient back at baseline. Given juice and meal. Patient sent down to dialysis with food.

## 2015-10-08 NOTE — H&P (Signed)
[facility] History & Physical Update  The patient was interviewed and re-examined.  The patient's previous History and Physical has been reviewed and is unchanged.  There is no change in the plan of care. We plan to proceed with the scheduled procedure.  Schnier, Dolores Lory, MD  10/08/2015, 2:54 PM

## 2015-10-08 NOTE — Progress Notes (Signed)
PT Cancellation Note  Patient Details Name: NOORA BARRICKMAN MRN: [id number] DOB: [birth year]   Cancelled Treatment:    Reason Eval/Treat Not Completed: Patient declined, no reason specified. Pt well known to therapy and hospital staff. Evaluation attempted, however pt very rude reporting she does not want ANY therapy and to leave her alone. She reports all she wants to do is sleep. Prior to leaving the room, therapist asked if I could bring her anything. She requested tissues however then refused once given a box, stating she didn't want them. Pt refuses all mobility attempts at this time. Per chart review and discussion with pt, she is very mobile at baseline and has been ambulatory this admission with RN staff. Discussed case with RN aide and charge RN. Will dc current order per patient request. RN staff plans to continue mobility efforts. Does not require PT assessment at this time.  Indica Marcott 10/08/2015, 11:28 AM Greggory Stallion, PT, DPT [phone]

## 2015-10-08 NOTE — Progress Notes (Deleted)
Patient found alert but unable to speak and having difficulty moving. Vitals assessed. Stable. CBG checked. Results 11. Given 1 ampule of D50. Rechecked. Results 79. Patient back at baseline. Given juice and meal. Patient sent down to dialysis with food.

## 2015-10-08 NOTE — Progress Notes (Signed)
Post HD pt arterial site bled for >1 hour. Gel foam and roller gauze applied. Candiss Norse MD notified. Ordered pt to be NPO and consult for angiogram.

## 2015-10-08 NOTE — Progress Notes (Signed)
Post hd vitals 

## 2015-10-08 NOTE — Progress Notes (Signed)
End of hd tx  

## 2015-10-08 NOTE — Progress Notes (Signed)
Post hd assessment 

## 2015-10-08 NOTE — Op Note (Signed)
OPERATIVE NOTE   PROCEDURE: 1. Contrast injection right arm AV access  PRE-OPERATIVE DIAGNOSIS: Complication of dialysis access                                                       End Stage Renal Disease  POST-OPERATIVE DIAGNOSIS: same as above   SURGEON: Katha Cabal, M.D.  ANESTHESIA: Conscious sedation was administered under my direct supervision. IV Versed plus fentanyl were utilized. Continuous ECG, pulse oximetry and blood pressure was monitored throughout the entire procedure.  Conscious sedation was for a total of 40 minutes.  ESTIMATED BLOOD LOSS: minimal  FINDING(S): 1. Overall the graft is in good shape the area that they are accessing has already been stented and I suspect that repetitive use over a fairly short area of her graft has just lead to wear and tear that is prolonging her bleeding. 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 female who  presents with malfunctioning right arm AV access.  The patient is scheduled for angiography with possible intervention of the AV access.  The patient is aware the risks include but are not limited to: bleeding, infection, thrombosis of the cannulated access, and possible anaphylactic reaction to the contrast.  The patient acknowledges if the access can not be salvaged a tunneled catheter will be needed and will be placed during this procedure.  The patient is aware of the risks of the procedure and elects to proceed with the angiogram and intervention.  DESCRIPTION: After full informed written consent was obtained, the patient was brought back to the Special Procedure suite and placed supine position.  Appropriate cardiopulmonary monitors were placed.  The right arm was prepped and draped in the standard fashion.  Appropriate timeout is called. The right AV graft  was cannulated  with a micropuncture needle. Ultrasound was placed in a sterile sleeve ultrasound as utilized secondary to lack of appropriate landmarks and to avoid vascular injury. Under direct ultrasound visualization after images been recorded for the permanent record the microneedle was used to access the AV access  The microwire was advanced and the needle was exchanged for  a microsheath.  The J-wire was then advanced and a 6 Fr sheath inserted.  Hand injections were completed to image the access from the arterial anastomosis through the entire access.  The central venous structures were also imaged by hand injections.  Based on the images,  no further intervention is needed at this time. Please see the findings above for description of the current status of her access    A 4-0 Monocryl purse-string suture was sewn around the sheath.  The sheath was removed and light pressure was applied.  A sterile bandage was applied to the puncture site.    COMPLICATIONS: None  CONDITION: Stable  Katha Cabal, M.D [facility] Office: [phone]  10/08/2015 3:47 PM

## 2015-10-08 NOTE — Progress Notes (Signed)
Pt returns from special procedures. No complaints of pain, NSR. RUA fistula has a dressing which is clean, dry and intact. VSS. I will continue to assess.

## 2015-10-08 NOTE — Progress Notes (Signed)
Dr Leslye Peer updated on patient complaint of "chest pain" with breathing, completing ECG no additional orders, OK to proceed with VIR procedure.

## 2015-10-08 NOTE — Progress Notes (Signed)
Patient brought to [facility] for procedure prep, reporting 5/10 sharp chest pain with breathing. Dr Delana Meyer notified, Dr Darvin Neighbours paged. Awaiting callback. Vital signs stable

## 2015-10-09 ENCOUNTER — Inpatient Hospital Stay: Payer: Medicare Other

## 2015-10-09 ENCOUNTER — Encounter: Payer: Self-pay | Admitting: Vascular Surgery

## 2015-10-09 LAB — CBC
HEMATOCRIT: 23.2 % — AB (ref 35.0–47.0)
HEMOGLOBIN: 7.8 g/dL — AB (ref 12.0–16.0)
MCH: 26.2 pg (ref 26.0–34.0)
MCHC: 33.5 g/dL (ref 32.0–36.0)
MCV: 78 fL — ABNORMAL LOW (ref 80.0–100.0)
Platelets: 222 10*3/uL (ref 150–440)
RBC: 2.98 MIL/uL — ABNORMAL LOW (ref 3.80–5.20)
RDW: 22.4 % — ABNORMAL HIGH (ref 11.5–14.5)
WBC: 4.2 10*3/uL (ref 3.6–11.0)

## 2015-10-09 LAB — BASIC METABOLIC PANEL
Anion gap: 10 (ref 5–15)
BUN: 46 mg/dL — AB (ref 6–20)
CHLORIDE: 97 mmol/L — AB (ref 101–111)
CO2: 26 mmol/L (ref 22–32)
CREATININE: 6.47 mg/dL — AB (ref 0.44–1.00)
Calcium: 8.9 mg/dL (ref 8.9–10.3)
GFR calc non Af Amer: 6 mL/min — ABNORMAL LOW (ref >60)
GFR, EST AFRICAN AMERICAN: 7 mL/min — AB (ref >60)
Glucose, Bld: 171 mg/dL — ABNORMAL HIGH (ref 65–99)
Potassium: 5.9 mmol/L — ABNORMAL HIGH (ref 3.5–5.1)
Sodium: 133 mmol/L — ABNORMAL LOW (ref 135–145)

## 2015-10-09 LAB — HEPATITIS B SURFACE ANTIGEN: Hepatitis B Surface Ag: NEGATIVE

## 2015-10-09 LAB — HEPATITIS B SURFACE ANTIBODY,QUALITATIVE: Hep B S Ab: NONREACTIVE

## 2015-10-09 LAB — GLUCOSE, CAPILLARY
GLUCOSE-CAPILLARY: 124 mg/dL — AB (ref 65–99)
Glucose-Capillary: 154 mg/dL — ABNORMAL HIGH (ref 65–99)

## 2015-10-09 MED ORDER — EPOETIN ALFA 10000 UNIT/ML IJ SOLN
10000.0000 [IU] | Freq: Once | INTRAMUSCULAR | Status: AC
Start: 2015-10-09 — End: 2015-10-09
  Administered 2015-10-09: 10000 [IU] via INTRAVENOUS

## 2015-10-09 MED ORDER — CALCIUM ACETATE (PHOS BINDER) 667 MG PO CAPS: 667.0000 mg | ORAL_CAPSULE | Freq: Two times a day (BID) | ORAL | Status: DC | PRN

## 2015-10-09 MED ORDER — CALCIUM ACETATE (PHOS BINDER) 667 MG PO CAPS
1334.0000 mg | ORAL_CAPSULE | Freq: Three times a day (TID) | ORAL | Status: DC
  Administered 2015-10-09: 1334 mg via ORAL
  Filled 2015-10-09: qty 2

## 2015-10-09 MED ORDER — PREDNISONE 10 MG PO TABS: 10.0000 mg | ORAL_TABLET | Freq: Every day | ORAL | Status: DC

## 2015-10-09 NOTE — Progress Notes (Signed)
Pre-Dialysis assessment. 

## 2015-10-09 NOTE — Progress Notes (Signed)
Dialysis complete

## 2015-10-09 NOTE — Progress Notes (Signed)
Subjective:   Patient known to our practice from outpatient dialysis and previous admissions She called her dialysis nurse that she is not able to breath. Dialysis nurse called 911 to get her evaluated. In ER, her CXR shows congestion. She was hypoxic. She denies fever but has cough and sputum.   Potassium high again today Urgent HD requested   HEMODIALYSIS FLOWSHEET:  Blood Flow Rate (mL/min): 400 mL/min Arterial Pressure (mmHg): -140 mmHg Venous Pressure (mmHg): 220 mmHg Transmembrane Pressure (mmHg): 80 mmHg Ultrafiltration Rate (mL/min): 1290 mL/min Dialysate Flow Rate (mL/min): 800 ml/min Conductivity: Machine : 14 Conductivity: Machine : 14 Dialysis Fluid Bolus: Normal Saline Bolus Amount (mL): 250 mL Dialysate Change: 2K Intra-Hemodialysis Comments: 4500. Tx complete    Objective:  Vital signs in last 24 hours:  Temp:  [97.8 F (36.6 C)-98.3 F (36.8 C)] 97.8 F (36.6 C) (09/20 1419) Pulse Rate:  [56-79] 79 (09/20 1419) Resp:  [14-19] 16 (09/20 1419) BP: (123-149)/(67-98) 137/74 (09/20 1419) SpO2:  [95 %-100 %] 100 % (09/20 1419) Weight:  [68.3 kg (150 lb 9.2 oz)-72.3 kg (159 lb 6.3 oz)] 68.3 kg (150 lb 9.2 oz) (09/20 1419)  Weight change: -8.668 kg (-19 lb 1.8 oz) Filed Weights   10/09/15 0555 10/09/15 1023 10/09/15 1419  Weight: 72.1 kg (158 lb 14.2 oz) 72.3 kg (159 lb 6.3 oz) 68.3 kg (150 lb 9.2 oz)    Intake/Output:    Intake/Output Summary (Last 24 hours) at 10/09/15 1433 Last data filed at 10/09/15 1419  Gross per 24 hour  Intake              163 ml  Output             4000 ml  Net            -3837 ml     Physical Exam: General: NAD, laying in bed  HEENT Anicteric; moist mucus membranes  Neck supple  Pulm/lungs Coarse b/l  CVS/Heart Regular, no rub  Abdomen:  Soft, NT  Extremities: trace edema  Neurologic: Alert, oreinted  Skin: No acute rashes  Access: AVG       Basic Metabolic Panel:   Recent Labs Lab 10/07/15 1808  10/07/15 1939 10/08/15 0134 10/08/15 0432 10/09/15 0432  NA 130* 131*  --  136 133*  K 7.4* 6.6* 3.7 4.1 5.9*  CL 92* 93*  --  97* 97*  CO2 29 28  --  32 26  GLUCOSE 84 86  --  111* 171*  BUN 55* 57*  --  25* 46*  CREATININE 7.75* 8.03*  --  4.31* 6.47*  CALCIUM 9.5 9.3  --  9.0 8.9     CBC:  Recent Labs Lab 10/07/15 1808 10/08/15 0432 10/09/15 0432  WBC 4.8 3.9 4.2  NEUTROABS 3.3  --   --   HGB 8.6* 8.2* 7.8*  HCT 26.3* 23.8* 23.2*  MCV 78.1* 76.8* 78.0*  PLT 175 236 222      Microbiology:  Recent Results (from the past 720 hour(s))  MRSA PCR Screening     Status: Abnormal   Collection Time: 10/08/15 11:22 AM  Result Value Ref Range Status   MRSA by PCR POSITIVE (A) NEGATIVE Final    Comment:        The GeneXpert MRSA Assay (FDA approved for NASAL specimens only), is one component of a comprehensive MRSA colonization surveillance program. It is not intended to diagnose MRSA infection nor to guide or monitor treatment for MRSA infections. RESULT  CALLED TO, READ BACK BY AND VERIFIED WITH: TAMMY TODD 10/08/15 @ 1635  [place]     Coagulation Studies: No results for input(s): LABPROT, INR in the last 72 hours.  Urinalysis: No results for input(s): COLORURINE, LABSPEC, PHURINE, GLUCOSEU, HGBUR, BILIRUBINUR, KETONESUR, PROTEINUR, UROBILINOGEN, NITRITE, LEUKOCYTESUR in the last 72 hours.  Invalid input(s): APPERANCEUR    Imaging: Dg Chest 2 View  Result Date: 10/07/2015 CLINICAL DATA:  Shortness of breath, chest pain, possible congestive heart failure EXAM: CHEST  2 VIEW COMPARISON:  07/29/2015 FINDINGS: Cardiomegaly. Central mild vascular congestion and mild perihilar interstitial prominence suspicious for mild interstitial edema. Small left pleural effusion with left basilar atelectasis on infiltrate. IMPRESSION: Central mild vascular congestion and mild perihilar interstitial prominence suspicious for mild interstitial edema. Small left pleural effusion with  left basilar atelectasis on infiltrate. Electronically Signed   By: Lahoma Crocker M.D.   On: 10/07/2015 18:49     Medications:   . dextrose Stopped (10/08/15 2128)   . amiodarone  200 mg Oral Daily  . amLODipine  5 mg Oral Daily  . calcium acetate  1,334 mg Oral TID WC  . Chlorhexidine Gluconate Cloth  6 each Topical Q0600  . cinacalcet  60 mg Oral Daily  . clopidogrel  75 mg Oral Daily  . docusate sodium  100 mg Oral BID  . furosemide  80 mg Oral Q T,Th,S,Su  . insulin aspart  6 Units Intravenous Once  . isosorbide mononitrate  30 mg Oral Daily  . metoprolol tartrate  12.5 mg Oral BID  . mometasone-formoterol  2 puff Inhalation BID  . mupirocin ointment  1 application Nasal BID  . pantoprazole  40 mg Oral BID  . [START ON 10/10/2015] predniSONE  10 mg Oral Q breakfast  . sodium chloride flush  3 mL Intravenous Q12H  . sodium chloride flush  3 mL Intravenous Q12H  . tiotropium  18 mcg Inhalation Daily   sodium chloride, acetaminophen **OR** acetaminophen, albuterol, calcium acetate, ipratropium-albuterol, nitroGLYCERIN, ondansetron **OR** ondansetron (ZOFRAN) IV, polyethylene glycol, sodium chloride flush  Assessment/ Plan:  60 y.o.african [place] female with hypertension, anemia of chronic kidney disease, secondary hyperparathyroidism, history of meningitis as a child with resultant hearing loss  1. End Stage Renal Disease: TTS [facility] [place] With hyperkalemia on admission requiring emergent dialysis.   -urgent HD today due to hyperkalemia again - Routine HD tomorrow  2. Anemia of chronic kidney disease:  - epo with treatment TTS  3. Secondary Hyperparathyroidism:   - monitor phos during hospital stay - Cinacalcet - calcium acetate for binding.   4. Shortness of breath - chronic left pleural effusion - breathing improved after 4000 cc removed with HD today  5. 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

## 2015-10-09 NOTE — Progress Notes (Signed)
Pre dialysis  

## 2015-10-09 NOTE — Progress Notes (Signed)
Patient ID: Kelsey Peterson, female   DOB: 1955-09-08, 60 y.o.   MRN: [id number]  [facility] PROGRESS NOTE  Kelsey Peterson [id number] DOB: August 07, 1955 DOA: 10/07/2015 PCP: No PCP Per Patient  HPI/Subjective: Patient seen earlier on dialysis. She complains that her knees have not flared up and she can't walk. Her shortness of breath and chest pain are better.  Objective: Vitals:   10/09/15 1403 10/09/15 1419  BP: 136/80 137/74  Pulse: 77 79  Resp: 17 16  Temp:  97.8 F (36.6 C)    Filed Weights   10/09/15 0555 10/09/15 1023 10/09/15 1419  Weight: 72.1 kg (158 lb 14.2 oz) 72.3 kg (159 lb 6.3 oz) 68.3 kg (150 lb 9.2 oz)    ROS: Review of Systems  Constitutional: Negative for chills and fever.  Eyes: Negative for blurred vision.  Respiratory: Negative for cough and shortness of breath.   Cardiovascular: Negative for chest pain.  Gastrointestinal: Negative for abdominal pain, constipation, diarrhea, nausea and vomiting.  Genitourinary: Negative for dysuria.  Musculoskeletal: Positive for joint pain.  Neurological: Negative for dizziness and headaches.   Exam: Physical Exam  Constitutional: She is oriented to person, place, and time.  HENT:  Nose: No mucosal edema.  Mouth/Throat: No oropharyngeal exudate or posterior oropharyngeal edema.  Eyes: Conjunctivae, EOM and lids are normal. Pupils are equal, round, and reactive to light.  Neck: No JVD present. Carotid bruit is not present. No edema present. No thyroid mass and no thyromegaly present.  Cardiovascular: S1 normal and S2 normal.  Exam reveals no gallop.   Murmur heard.  Systolic murmur is present with a grade of 2/6  Pulses:      Dorsalis pedis pulses are 2+ on the right side, and 2+ on the left side.  Chest pain reproducible to palpation  Respiratory: No respiratory distress. She has decreased breath sounds in the right lower field and the left lower field. She has no wheezes. She has no rhonchi. She has  rales in the right lower field and the left lower field.  GI: Soft. Bowel sounds are normal. There is no tenderness.  Musculoskeletal:       Right ankle: She exhibits no swelling.       Left ankle: She exhibits no swelling.  Lymphadenopathy:    She has no cervical adenopathy.  Neurological: She is alert and oriented to person, place, and time. No cranial nerve deficit.  Skin: Skin is warm. No rash noted. Nails show no clubbing.  Psychiatric: She has a normal mood and affect.      Data Reviewed: Basic Metabolic Panel:  Recent Labs Lab 10/07/15 1808 10/07/15 1939 10/08/15 0134 10/08/15 0432 10/09/15 0432  NA 130* 131*  --  136 133*  K 7.4* 6.6* 3.7 4.1 5.9*  CL 92* 93*  --  97* 97*  CO2 29 28  --  32 26  GLUCOSE 84 86  --  111* 171*  BUN 55* 57*  --  25* 46*  CREATININE 7.75* 8.03*  --  4.31* 6.47*  CALCIUM 9.5 9.3  --  9.0 8.9   Liver Function Tests:  Recent Labs Lab 10/07/15 1808  AST 38  ALT 22  ALKPHOS 109  BILITOT 0.6  PROT 8.0  ALBUMIN 3.7   CBC:  Recent Labs Lab 10/07/15 1808 10/08/15 0432 10/09/15 0432  WBC 4.8 3.9 4.2  NEUTROABS 3.3  --   --   HGB 8.6* 8.2* 7.8*  HCT 26.3* 23.8* 23.2*  MCV 78.1*  76.8* 78.0*  PLT 175 236 222   Cardiac Enzymes:  Recent Labs Lab 10/07/15 1808  TROPONINI 0.07*   BNP (last 3 results)  Recent Labs  02/05/15 0647 04/18/15 1856  BNP >4,500.0* >4,500.0*     CBG:  Recent Labs Lab 10/08/15 1156 10/08/15 1421 10/08/15 1648 10/08/15 2047 10/09/15 0746  GLUCAP 87 82 86 283* 154*      Studies: Dg Chest 2 View  Result Date: 10/07/2015 CLINICAL DATA:  Shortness of breath, chest pain, possible congestive heart failure EXAM: CHEST  2 VIEW COMPARISON:  07/29/2015 FINDINGS: Cardiomegaly. Central mild vascular congestion and mild perihilar interstitial prominence suspicious for mild interstitial edema. Small left pleural effusion with left basilar atelectasis on infiltrate. IMPRESSION: Central mild  vascular congestion and mild perihilar interstitial prominence suspicious for mild interstitial edema. Small left pleural effusion with left basilar atelectasis on infiltrate. Electronically Signed   By: Lahoma Crocker M.D.   On: 10/07/2015 18:49   Dg Knee 1-2 Views Left  Result Date: 10/09/2015 CLINICAL DATA:  Bilateral knee pain, recent falls EXAM: LEFT KNEE - 1-2 VIEW COMPARISON:  Left knee films of 07/19/2012 FINDINGS: Tricompartmental degenerative joint disease of the left knee is noted. The primary involvement is that of the medial compartment where there is considerable loss of joint space, sclerosis, spurring, and subchondral cyst formation present. No fracture is seen. However, there does appear to be a small amount of left knee joint fluid present. IMPRESSION: 1. 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 2. Small amount of left knee joint fluid. Electronically Signed   By: Ivar Drape M.D.   On: 10/09/2015 15:59   Dg Knee 1-2 Views Right  Result Date: 10/09/2015 CLINICAL DATA:  Bilateral knee pain, recent fall EXAM: RIGHT KNEE - 1-2 VIEW COMPARISON:  None. FINDINGS: There is tricompartmental degenerative joint disease of the right knee. The primary involvement is that of the medial compartment where there is more loss of joint space, sclerosis, and spurring present. No definite joint effusion is seen on the lateral view. No fracture is seen. IMPRESSION: Tricompartmental degenerative joint disease primarily involving the medial compartment. No acute abnormality. Electronically Signed   By: Ivar Drape M.D.   On: 10/09/2015 15:57    Scheduled Meds: . amiodarone  200 mg Oral Daily  . amLODipine  5 mg Oral Daily  . calcium acetate  1,334 mg Oral TID WC  . Chlorhexidine Gluconate Cloth  6 each Topical Q0600  . cinacalcet  60 mg Oral Daily  . clopidogrel  75 mg Oral Daily  . docusate sodium  100 mg Oral BID  . furosemide  80 mg Oral Q  T,Th,S,Su  . insulin aspart  6 Units Intravenous Once  . isosorbide mononitrate  30 mg Oral Daily  . metoprolol tartrate  12.5 mg Oral BID  . mometasone-formoterol  2 puff Inhalation BID  . mupirocin ointment  1 application Nasal BID  . pantoprazole  40 mg Oral BID  . [START ON 10/10/2015] predniSONE  10 mg Oral Q breakfast  . sodium chloride flush  3 mL Intravenous Q12H  . sodium chloride flush  3 mL Intravenous Q12H  . tiotropium  18 mcg Inhalation Daily    Assessment/Plan:  1. Acute on chronic systolic congestive heart failure with moderate mitral regurgitation. Improved 2. Acute on chronic respiratory failure continue oxygen supplementation 3. Hyperkalemia today. Patient went for next dialysis today. Recheck potassium tomorrow 4. Hypoglycemia. Could've been secondary to the  treatment with insulin for the hyperkalemia. Continue to monitor off IV fluids 5. Bleeding from dialysis access. Vascular surgery did an angiogram yesterday and saw an aneurysm but otherwise the flow was good. 6. Bilateral knee pain and inability to walk. X-ray showing arthritis. I will give low-dose prednisone to see if this is inflammatory. Physical therapy evaluation 7. History of SVT on amiodarone 8. End-stage renal disease on dialysis. Received extra dialysis today 9. Essential hypertension on Norvasc and metoprolol  Code Status:     Code Status Orders        Start     Ordered   10/07/15 1921  Full code  Continuous     10/07/15 1921    Code Status History    Date Active Date Inactive Code Status Order ID Comments User Context   07/29/2015  6:11 PM 07/31/2015  8:49 PM Full Code [id number]  Lytle Butte, MD ED   04/18/2015 11:33 PM 04/23/2015  8:26 PM Full Code [id number]  Sylvan Cheese, MD Inpatient   04/02/2015  3:21 AM 04/04/2015  8:25 PM Full Code [id number]  Lance Coon, MD Inpatient   02/05/2015  9:17 AM 02/08/2015  8:00 PM Full Code [id number]  Fritzi Mandes, MD Inpatient   12/01/2014  3:08 PM 12/07/2014   7:15 PM Full Code [id number]  Demetrios Loll, MD Inpatient   09/01/2014  5:57 PM 09/06/2014  2:09 PM Full Code [id number]  Demetrios Loll, MD ED   07/14/2014  5:17 AM 07/16/2014  6:40 PM Full Code [id number]  Lance Coon, MD Inpatient   07/03/2014  2:06 PM 07/10/2014  7:11 PM Full Code [id number]  Demetrios Loll, MD Inpatient   06/21/2014  1:35 PM 06/22/2014  4:33 PM Full Code [id number]  Max Sane, MD Inpatient     Disposition Plan: Hopefully discharge tomorrow  Consultants:  Vascular surgery  Nephrology  Time spent: 35 minutes  [facility] [place], [facility]

## 2015-10-09 NOTE — Progress Notes (Signed)
Post Dialysis 

## 2015-10-09 NOTE — Progress Notes (Signed)
Dialysis initiated without issue  

## 2015-10-09 NOTE — Care Management (Signed)
Patient was off floor in dialysis when this RNCM rounded.  She is long-term dialysis patient per Dr. Candiss Norse.

## 2015-10-10 LAB — BASIC METABOLIC PANEL
Anion gap: 9 (ref 5–15)
BUN: 26 mg/dL — ABNORMAL HIGH (ref 6–20)
CALCIUM: 8.8 mg/dL — AB (ref 8.9–10.3)
CO2: 32 mmol/L (ref 22–32)
Chloride: 96 mmol/L — ABNORMAL LOW (ref 101–111)
Creatinine, Ser: 4.16 mg/dL — ABNORMAL HIGH (ref 0.44–1.00)
GFR calc Af Amer: 12 mL/min — ABNORMAL LOW (ref >60)
GFR, EST NON AFRICAN AMERICAN: 11 mL/min — AB (ref >60)
Glucose, Bld: 103 mg/dL — ABNORMAL HIGH (ref 65–99)
POTASSIUM: 4.5 mmol/L (ref 3.5–5.1)
SODIUM: 137 mmol/L (ref 135–145)

## 2015-10-10 MED ORDER — CALCIUM ACETATE (PHOS BINDER) 667 MG PO CAPS: 667.0000 mg | ORAL_CAPSULE | Freq: Two times a day (BID) | ORAL | Status: DC | PRN

## 2015-10-10 MED ORDER — PREDNISONE 5 MG PO TABS: ORAL_TABLET | ORAL | 0 refills | Status: DC

## 2015-10-10 MED ORDER — CALCIUM ACETATE (PHOS BINDER) 667 MG PO CAPS: 1334.0000 mg | ORAL_CAPSULE | Freq: Three times a day (TID) | ORAL | Status: DC

## 2015-10-10 MED ORDER — EPOETIN ALFA 10000 UNIT/ML IJ SOLN: 10000.0000 [IU] | Freq: Once | INTRAMUSCULAR | Status: DC

## 2015-10-10 NOTE — Progress Notes (Signed)
Subjective:   Patient known to our practice from outpatient dialysis and previous admissions She called her dialysis nurse that she is not able to breath. Dialysis nurse called 911 to get her evaluated. In ER, her CXR shows congestion. She was hypoxic. She denies fever but has cough and sputum.     So Far 7000 cc removed with dialysis last 2 days     HEMODIALYSIS FLOWSHEET:  Blood Flow Rate (mL/min): 400 mL/min Arterial Pressure (mmHg): -120 mmHg Venous Pressure (mmHg): 220 mmHg Transmembrane Pressure (mmHg): 70 mmHg Ultrafiltration Rate (mL/min): 670 mL/min Dialysate Flow Rate (mL/min): 800 ml/min Conductivity: Machine : 14 Conductivity: Machine : 14 Dialysis Fluid Bolus: Normal Saline Bolus Amount (mL): 250 mL Dialysate Change: 2K Intra-Hemodialysis Comments: 870. Resting    Objective:  Vital signs in last 24 hours:  Temp:  [97.8 F (36.6 C)-98.4 F (36.9 C)] 98 F (36.7 C) (09/21 0942) Pulse Rate:  [56-79] 59 (09/21 1100) Resp:  [14-26] 14 (09/21 1100) BP: (115-140)/(53-88) 139/71 (09/21 1100) SpO2:  [95 %-100 %] 100 % (09/21 1100) Weight:  [68.2 kg (150 lb 4.8 oz)-71 kg (156 lb 8.4 oz)] 71 kg (156 lb 8.4 oz) (09/21 0942)  Weight change: 0.228 kg (8 oz) Filed Weights   10/09/15 1419 10/10/15 0529 10/10/15 0942  Weight: 68.3 kg (150 lb 9.2 oz) 68.2 kg (150 lb 4.8 oz) 71 kg (156 lb 8.4 oz)    Intake/Output:    Intake/Output Summary (Last 24 hours) at 10/10/15 1128 Last data filed at 10/10/15 0532  Gross per 24 hour  Intake                0 ml  Output             4000 ml  Net            -4000 ml     Physical Exam: General: NAD, laying in bed  HEENT Anicteric; moist mucus membranes  Neck supple  Pulm/lungs Coarse b/l  CVS/Heart Regular, no rub  Abdomen:  Soft, NT  Extremities: trace edema  Neurologic: Alert, oreinted  Skin: No acute rashes  Access: AVG       Basic Metabolic Panel:   Recent Labs Lab 10/07/15 1808 10/07/15 1939 10/08/15 0134  10/08/15 0432 10/09/15 0432 10/10/15 0330  NA 130* 131*  --  136 133* 137  K 7.4* 6.6* 3.7 4.1 5.9* 4.5  CL 92* 93*  --  97* 97* 96*  CO2 29 28  --  32 26 32  GLUCOSE 84 86  --  111* 171* 103*  BUN 55* 57*  --  25* 46* 26*  CREATININE 7.75* 8.03*  --  4.31* 6.47* 4.16*  CALCIUM 9.5 9.3  --  9.0 8.9 8.8*     CBC:  Recent Labs Lab 10/07/15 1808 10/08/15 0432 10/09/15 0432  WBC 4.8 3.9 4.2  NEUTROABS 3.3  --   --   HGB 8.6* 8.2* 7.8*  HCT 26.3* 23.8* 23.2*  MCV 78.1* 76.8* 78.0*  PLT 175 236 222      Microbiology:  Recent Results (from the past 720 hour(s))  MRSA PCR Screening     Status: Abnormal   Collection Time: 10/08/15 11:22 AM  Result Value Ref Range Status   MRSA by PCR POSITIVE (A) NEGATIVE Final    Comment:        The GeneXpert MRSA Assay (FDA approved for NASAL specimens only), is one component of a comprehensive MRSA colonization surveillance program. It is  not intended to diagnose MRSA infection nor to guide or monitor treatment for MRSA infections. RESULT CALLED TO, READ BACK BY AND VERIFIED WITH: TAMMY TODD 10/08/15 @ 1635  [place]     Coagulation Studies: No results for input(s): LABPROT, INR in the last 72 hours.  Urinalysis: No results for input(s): COLORURINE, LABSPEC, PHURINE, GLUCOSEU, HGBUR, BILIRUBINUR, KETONESUR, PROTEINUR, UROBILINOGEN, NITRITE, LEUKOCYTESUR in the last 72 hours.  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 The primary involvement is that of the medial compartment where there is considerable loss of joint space, sclerosis, spurring, and subchondral cyst formation present. No fracture is seen. However, there does appear to be a small amount of left knee joint fluid present.  IMPRESSION: 1. 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 2. Small amount of left knee joint fluid. Electronically Signed   By: Ivar Drape M.D.   On: 10/09/2015 15:59   Dg Knee 1-2 Views Right  Result Date: 10/09/2015 CLINICAL DATA:  Bilateral knee pain, recent fall EXAM: RIGHT KNEE - 1-2 VIEW COMPARISON:  None. FINDINGS: There is tricompartmental degenerative joint disease of the right knee. The primary involvement is that of the medial compartment where there is more loss of joint space, sclerosis, and spurring present. No definite joint effusion is seen on the lateral view. No fracture is seen. IMPRESSION: Tricompartmental degenerative joint disease primarily involving the medial compartment. No acute abnormality. Electronically Signed   By: Ivar Drape M.D.   On: 10/09/2015 15:57     Medications:     . amiodarone  200 mg Oral Daily  . amLODipine  5 mg Oral Daily  . calcium acetate  1,334 mg Oral TID WC  . Chlorhexidine Gluconate Cloth  6 each Topical Q0600  . cinacalcet  60 mg Oral Daily  . clopidogrel  75 mg Oral Daily  . docusate sodium  100 mg Oral BID  . furosemide  80 mg Oral Q T,Th,S,Su  . insulin aspart  6 Units Intravenous Once  . isosorbide mononitrate  30 mg Oral Daily  . metoprolol tartrate  12.5 mg Oral BID  . mometasone-formoterol  2 puff Inhalation BID  . mupirocin ointment  1 application Nasal BID  . pantoprazole  40 mg Oral BID  . predniSONE  10 mg Oral Q breakfast  . sodium chloride flush  3 mL Intravenous Q12H  . sodium chloride flush  3 mL Intravenous Q12H  . tiotropium  18 mcg Inhalation Daily   sodium chloride, acetaminophen **OR** acetaminophen, albuterol, calcium acetate, ipratropium-albuterol, nitroGLYCERIN, ondansetron **OR** ondansetron (ZOFRAN) IV, polyethylene glycol, sodium chloride flush  Assessment/ Plan:  60 y.o.african [place] female with hypertension, anemia of chronic kidney disease,  secondary hyperparathyroidism, history of meningitis as a child with resultant hearing loss  1. End Stage Renal Disease: TTS [facility] [place] With hyperkalemia on admission requiring emergent dialysis.   - Routine HD today  2. Anemia of chronic kidney disease:  - epo with treatment TTS  3. Secondary Hyperparathyroidism:   - monitor phos during hospital stay - Cinacalcet - calcium acetate for binding.   4. Shortness of breath - chronic left pleural effusion - breathing improved after fluid removed with HD ; 7000 cc removed with last 2 HD  treatments - UF goal approx 2 L today - Today's weight 71 kg. Outpatient EDW 68 kg  5. 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 Normal bleeding times yesterday after dialysis   LOS: 3 Nolawi Kanady 9/21/201711:28 AM

## 2015-10-10 NOTE — Evaluation (Signed)
Physical Therapy Evaluation Patient Details Name: Kelsey Peterson MRN: [id number] DOB: 11-23-1955 Today's Date: 10/10/2015   History of Present Illness  Pt admitted for bleeding from dialysis catheter in R UE. 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 Pt with complaints of SOB and knee pain. Imaging negative at this time. Pt with history of HTN, ESRD, CHF, COPD, and CRF.   Clinical Impression  Pt is a agitated and grumpy 60 year old female who was admitted for bleeding from dialysis catheter. Pt performs bed mobility independently, transfers with mod I, and ambulation with cga using RW.  Pt typically uses SPC, however agrees that using RW is safer to prevent falls and prevents B knee pain. Pt reports she does not want further PT as she is a private person and wants the doctor to stop making her participate. She reports she has all needs met at home environment and does not want follow up services at home. Plans for dialysis this date and then she wants to dc from hospital. Pt does not require any further PT needs at this time. Pt will be dc in house and does not require follow up. RN aware. Will dc current orders.      Follow Up Recommendations No PT follow up    Equipment Recommendations  None recommended by PT    Recommendations for Other Services       Precautions / Restrictions Precautions Precautions: Fall Restrictions Weight Bearing Restrictions: No      Mobility  Bed Mobility Overal bed mobility: Independent             General bed mobility comments: Pt able to sit at EOB with no assistance. Safe technique performed  Transfers Overall transfer level: Modified independent Equipment used: Rolling walker (2 wheeled)             General transfer comment: Pt hesitant to work with therapist, however able to stand with mod I using RW.   Ambulation/Gait Ambulation/Gait assistance: Min guard Ambulation Distance (Feet): 40 Feet Assistive  device: Rolling walker (2 wheeled) Gait Pattern/deviations: Step-through pattern     General Gait Details: Pt only agreeable to walk short distances with RW. Safe technique performed with no B knee buckling. Pt reports using RW feels better than using SPC. 2L of O2 donned for all mobility.  Stairs            Wheelchair Mobility    Modified Rankin (Stroke Patients Only)       Balance Overall balance assessment: History of Falls;Needs assistance Sitting-balance support: Feet supported Sitting balance-Leahy Scale: Normal     Standing balance support: Bilateral upper extremity supported Standing balance-Leahy Scale: Good                               Pertinent Vitals/Pain Pain Assessment: No/denies pain    Home Living Family/patient expects to be discharged to:: Private residence Living Arrangements: Alone Available Help at Discharge: Friend(s) Type of Home: Apartment Home Access: Stairs to enter Entrance Stairs-Rails: None Entrance Stairs-Number of Steps: 1+1 Home Layout: One level Home Equipment: Walker - 4 wheels;Cane - single point Additional Comments: All information obtained from medical record. Pt refuses to answer questions from physical therapist because she states they are too private questions for her to answer.    Prior Function Level of Independence: Independent with assistive device(s)         Comments: Pt uses  SPC within home environment, 4WRW within community, ambulates mostly household distances; endorses falls but will not recount how many     Hand Dominance        Extremity/Trunk Assessment   Upper Extremity Assessment: Overall WFL for tasks assessed (grossly observed with functional movement)           Lower Extremity Assessment: Generalized weakness (appears grossly 4/5 assessed with functional movement)         Communication   Communication: HOH  Cognition Arousal/Alertness: Awake/alert Behavior During Therapy:  WFL for tasks assessed/performed;Agitated Overall Cognitive Status: Within Functional Limits for tasks assessed                      General Comments      Exercises     Assessment/Plan    PT Assessment Patent does not need any further PT services  PT Problem List            PT Treatment Interventions      PT Goals (Current goals can be found in the Care Plan section)  Acute Rehab PT Goals Patient Stated Goal: to go home PT Goal Formulation: All assessment and education complete, DC therapy Time For Goal Achievement: 2015/11/06 Potential to Achieve Goals: Good    Frequency     Barriers to discharge        Co-evaluation               End of Session Equipment Utilized During Treatment: Oxygen Activity Tolerance: Patient tolerated treatment well Patient left: in bed Nurse Communication: Mobility status         Time: 6838-7065 PT Time Calculation (min) (ACUTE ONLY): 16 min   Charges:   PT Evaluation $PT Eval Low Complexity: 1 Procedure     PT G Codes:        Kelsey Peterson 2015-11-06, 10:50 AM Kelsey Peterson, PT, DPT [phone]

## 2015-10-10 NOTE — Discharge Summary (Signed)
[facility] NAME: Kelsey Peterson    MR#:  [id number]  DATE OF BIRTH:  Sep 13, 1955  DATE OF ADMISSION:  10/07/2015 ADMITTING PHYSICIAN: Hillary Bow, MD  DATE OF DISCHARGE: 10/09/2105  PRIMARY CARE PHYSICIAN: Clayborn Bigness   ADMISSION DIAGNOSIS:  Shortness of Breath  esrd  DISCHARGE DIAGNOSIS:  Active Problems:   CHF (congestive heart failure) ([place])   SECONDARY DIAGNOSIS:   Past Medical History:  Diagnosis Date  . Anemia of chronic disease   . CAD (coronary artery disease)   . Chronic systolic CHF (congestive heart failure) ([facility])    a. EF 45% by echo in 06/2014  . Cognitive impairment   . COPD (chronic obstructive pulmonary disease) ([place]) 07/14/2014  . ESRD on hemodialysis ([place])   . GERD (gastroesophageal reflux disease) 07/14/2014  . Hepatitis C   . HOH (hard of hearing)   . HTN (hypertension)   . Migraine with aura   . Mitral regurgitation    a. calcified mitral annulus, mod MR by echo in 06/2014  . Paroxysmal SVT (supraventricular tachycardia) ([place])    a. history of this, with last known occurrence in 06/2014  . Secondary hyperparathyroidism of renal origin [facility])     HOSPITAL COURSE:   1.  Acute on chronic systolic congestive heart failure with moderate mitral regurgitation. With 3 dialysis sessions during the hospital course this has improved. Lasix on nondialysis days. Lungs were clear upon discharge. Patient on metoprolol. 2. Acute on chronic respiratory failure. Continue oxygen supplementation at home. 3. Severe hyperkalemia on presentation. Patient had urgent dialysis upon presentation. Patient had 3 dialysis sessions with good response with the potassium. 4. Hypoglycemia after emergency treatment for hyperkalemia with insulin. Patient was on D5 drip for a period of time. No further hypoglycemic episodes. 5. Bleeding from dialysis access after first dialysis session. Vascular surgery did an angiogram and so an  aneurysm but otherwise flow was good. They recommended changing the sites of accessing the dialysis graft. The patient as outpatient may not have had good flow and that could've been the cause of the heart failure and hyperkalemia. 6. Bilateral knee pain and inability to walk. X-rays showing arthritis I gave low-dose prednisone. Physical therapy recommended no physical therapy follow-up. Can follow up with orthopedic surgery as outpatient and can get this referral from medical doctor. 7. History of SVT on amiodarone 8. End-stage renal disease on dialysis Tuesday Thursday and Saturday. Patient was dialyzed on Thursday on her usual schedule. Patient received an extra dialysis on Wednesday. And had urgent dialysis Monday night into Tuesday morning. Continue dialysis on Saturday as outpatient 9. Essential hypertension on Norvasc and metoprolol 10. Anemia of chronic disease. Procrit with dialysis.    DISCHARGE CONDITIONS:   Satisfactory  CONSULTS OBTAINED:  Treatment Team:  Murlean Iba, MD  DRUG ALLERGIES:   Allergies  Allergen Reactions  . Contrast Media [Iodinated Diagnostic Agents] Other (See Comments)    Unknown reaction  . Morphine And Related Hives  . Other Rash    Blood Pressure Pill (Pt doesn't remember name)     DISCHARGE MEDICATIONS:   Current Discharge Medication List    START taking these medications   Details  predniSONE (DELTASONE) 5 MG tablet Take one tab po daily for two days then 1/2 tab po daily for two days Qty: 3 tablet, Refills: 0      CONTINUE these medications which have NOT CHANGED   Details  albuterol (PROVENTIL HFA;VENTOLIN HFA) 108 (  90 Base) MCG/ACT inhaler Inhale 2 puffs into the lungs every 6 (six) hours as needed for wheezing or shortness of breath. Qty: 1 Inhaler, Refills: 2    amiodarone (PACERONE) 200 MG tablet Take 1 tablet (200 mg total) by mouth daily.    amLODipine (NORVASC) 5 MG tablet Take 5 mg by mouth daily.    calcium acetate  (PHOSLO) 667 MG capsule Take 667-1,334 capsules by mouth 5 (five) times daily. 1334 mg 3 times daily with meals and 667 mg 2 times daily with snacks    cinacalcet (SENSIPAR) 60 MG tablet Take 60 mg by mouth daily.    clopidogrel (PLAVIX) 75 MG tablet Take 75 mg by mouth daily.    Fluticasone-Salmeterol (ADVAIR DISKUS) 250-50 MCG/DOSE AEPB Inhale 1 puff into the lungs 2 (two) times daily. Qty: 60 each, Refills: 0    folic acid-vitamin b complex-vitamin c-selenium-zinc (DIALYVITE) 3 MG TABS tablet Take 1 tablet by mouth daily.    furosemide (LASIX) 80 MG tablet Take 80 mg by mouth every Tuesday, Thursday, Saturday, and Sunday.     ipratropium-albuterol (DUONEB) 0.5-2.5 (3) MG/3ML SOLN Take 3 mLs by nebulization every 4 (four) hours as needed. Qty: 360 mL, Refills: 0    isosorbide mononitrate (IMDUR) 30 MG 24 hr tablet Take 30 mg by mouth daily.    metoprolol tartrate (LOPRESSOR) 25 MG tablet Take 12.5 mg by mouth 2 (two) times daily.    ondansetron (ZOFRAN) 4 MG tablet Take 4 mg by mouth every 8 (eight) hours as needed for nausea or vomiting. Reported on 02/05/2015    pantoprazole (PROTONIX) 40 MG tablet Take 1 tablet (40 mg total) by mouth 2 (two) times daily. Qty: 60 tablet, Refills: 0    tiotropium (SPIRIVA) 18 MCG inhalation capsule Place 18 mcg into inhaler and inhale daily.         DISCHARGE INSTRUCTIONS:   Follow-up PMD one week Dialysis as scheduled  If you experience worsening of your admission symptoms, develop shortness of breath, life threatening emergency, suicidal or homicidal thoughts you must seek medical attention immediately by calling 911 or calling your MD immediately  if symptoms less severe.  You Must read complete instructions/literature along with all the possible adverse reactions/side effects for all the Medicines you take and that have been prescribed to you. Take any new Medicines after you have completely understood and accept all the possible adverse  reactions/side effects.   Please note  You were cared for by a hospitalist during your hospital stay. If you have any questions about your discharge medications or the care you received while you were in the hospital after you are discharged, you can call the unit and asked to speak with the hospitalist on call if the hospitalist that took care of you is not available. Once you are discharged, your primary care physician will handle any further medical issues. Please note that NO REFILLS for any discharge medications will be authorized once you are discharged, as it is imperative that you return to your primary care physician (or establish a relationship with a primary care physician if you do not have one) for your aftercare needs so that they can reassess your need for medications and monitor your lab values.    Today   CHIEF COMPLAINT:   Chief Complaint  Patient presents with  . Shortness of Breath    HISTORY OF PRESENT ILLNESS:  Kelsey Peterson  is a 60 y.o. female with a known history of Presented with shortness of  breath   VITAL SIGNS:  Blood pressure (!) 119/59, pulse 78, temperature 97.5 F (36.4 C), temperature source Oral, resp. rate 16, height 5\' 2"  (1.575 m), weight 67.3 kg (148 lb 5.9 oz), SpO2 100 %.    PHYSICAL EXAMINATION:  GENERAL:  60 y.o.-year-old patient lying in the bed with no acute distress.  EYES: Pupils equal, round, reactive to light and accommodation. No scleral icterus. Extraocular muscles intact.  HEENT: Head atraumatic, normocephalic. Oropharynx and nasopharynx clear.  NECK:  Supple, no jugular venous distention. No thyroid enlargement, no tenderness.  LUNGS: Normal breath sounds bilaterally, no wheezing, rales,rhonchi or crepitation. No use of accessory muscles of respiration.  CARDIOVASCULAR: S1, S2 normal. 3 and a 6 systolic murmurs, no rubs, or gallops.  ABDOMEN: Soft, non-tender, non-distended. Bowel sounds present. No organomegaly or mass.   EXTREMITIES: Trace edema, no cyanosis, or clubbing.  NEUROLOGIC: Cranial nerves II through XII are intact. Muscle strength 5/5 in all extremities. Sensation intact. Gait not checked.  PSYCHIATRIC: The patient is alert and oriented x 3.  SKIN: No obvious rash, lesion, or ulcer.   DATA REVIEW:   CBC  Recent Labs Lab 10/09/15 0432  WBC 4.2  HGB 7.8*  HCT 23.2*  PLT 222    Chemistries   Recent Labs Lab 10/07/15 1808  10/10/15 0330  NA 130*  < > 137  K 7.4*  < > 4.5  CL 92*  < > 96*  CO2 29  < > 32  GLUCOSE 84  < > 103*  BUN 55*  < > 26*  CREATININE 7.75*  < > 4.16*  CALCIUM 9.5  < > 8.8*  AST 38  --   --   ALT 22  --   --   ALKPHOS 109  --   --   BILITOT 0.6  --   --   < > = values in this interval not displayed.  Cardiac Enzymes  Recent Labs Lab 10/07/15 1808  TROPONINI 0.07*    Microbiology Results  Results for orders placed or performed during the hospital encounter of 10/07/15  MRSA PCR Screening     Status: Abnormal   Collection Time: 10/08/15 11:22 AM  Result Value Ref Range Status   MRSA by PCR POSITIVE (A) NEGATIVE Final    Comment:        The GeneXpert MRSA Assay (FDA approved for NASAL specimens only), is one component of a comprehensive MRSA colonization surveillance program. It is not intended to diagnose MRSA infection nor to guide or monitor treatment for MRSA infections. RESULT CALLED TO, READ BACK BY AND VERIFIED WITH: TAMMY TODD 10/08/15 @ 63  [place]:  Dg Knee 1-2 Views Left  Result Date: 10/09/2015 CLINICAL DATA:  Bilateral knee pain, recent falls EXAM: LEFT KNEE - 1-2 VIEW COMPARISON:  Left knee films of 07/19/2012 FINDINGS: Tricompartmental degenerative joint disease of the left knee is noted. The primary involvement is that of the medial compartment where there is considerable loss of joint space, sclerosis, spurring, and subchondral cyst formation present. No fracture is seen. However, there does appear to be a  small amount of left knee joint fluid present. IMPRESSION: 1. 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 2. Small amount of left knee joint fluid. Electronically Signed   By: Ivar Drape M.D.   On: 10/09/2015 15:59   Dg Knee 1-2 Views Right  Result Date: 10/09/2015 CLINICAL DATA:  Bilateral knee pain, recent fall  EXAM: RIGHT KNEE - 1-2 VIEW COMPARISON:  None. FINDINGS: There is tricompartmental degenerative joint disease of the right knee. The primary involvement is that of the medial compartment where there is more loss of joint space, sclerosis, and spurring present. No definite joint effusion is seen on the lateral view. No fracture is seen. IMPRESSION: Tricompartmental degenerative joint disease primarily involving the medial compartment. No acute abnormality. Electronically Signed   By: Ivar Drape M.D.   On: 10/09/2015 15:57      Management plans discussed with the patient, ans she is in agreement.  CODE STATUS:     Code Status Orders        Start     Ordered   10/07/15 1921  Full code  Continuous     10/07/15 1921    Code Status History    Date Active Date Inactive Code Status Order ID Comments User Context   07/29/2015  6:11 PM 07/31/2015  8:49 PM Full Code [id number]  Lytle Butte, MD ED   04/18/2015 11:33 PM 04/23/2015  8:26 PM Full Code [id number]  Sylvan Cheese, MD Inpatient   04/02/2015  3:21 AM 04/04/2015  8:25 PM Full Code [id number]  Lance Coon, MD Inpatient   02/05/2015  9:17 AM 02/08/2015  8:00 PM Full Code [id number]  Fritzi Mandes, MD Inpatient   12/01/2014  3:08 PM 12/07/2014  7:15 PM Full Code [id number]  Demetrios Loll, MD Inpatient   09/01/2014  5:57 PM 09/06/2014  2:09 PM Full Code [id number]  Demetrios Loll, MD ED   07/14/2014  5:17 AM 07/16/2014  6:40 PM Full Code [id number]  Lance Coon, MD Inpatient   07/03/2014  2:06 PM 07/10/2014  7:11 PM Full Code [id number]  Demetrios Loll, MD Inpatient   06/21/2014  1:35 PM 06/22/2014  4:33 PM Full  Code [id number]  Max Sane, MD Inpatient      TOTAL TIME TAKING CARE OF THIS PATIENT: 35 minutes.    Loletha Grayer M.D on 10/10/2015 at 3:04 PM  Between 7am to 6pm - Pager - [phone]  After 6pm go to [email or web address] - password [email or web address]  [phone]  CC: Primary care physician; Clayborn Bigness

## 2015-10-10 NOTE — Discharge Instructions (Signed)
Heart Failure Clinic appointment on October 25, 2015 at 9:30am with Darylene Price, [place]. Please call [phone] to reschedule.

## 2015-10-10 NOTE — Progress Notes (Signed)
Heart Failure Clinic appointment scheduled for October 25, 2015 at 9:30am. Thank you for the referral.

## 2015-10-10 NOTE — Progress Notes (Signed)
Post hd assessment 

## 2015-10-10 NOTE — Progress Notes (Signed)
Pre Dialysis 

## 2015-10-10 NOTE — Progress Notes (Signed)
Dialysis started 

## 2015-10-10 NOTE — Care Management (Signed)
Patient is not available for CM to assess.  Patient is known to this CM.  and has been followed by [facility] in the past.  There has been issues about patient's pcp as bing located at [place].  The practice had discharged patient .  When patient returns from HD treatment, will speak with her about this

## 2015-10-10 NOTE — Progress Notes (Signed)
End of hd tx  

## 2015-10-10 NOTE — Progress Notes (Signed)
RN attempted to give patient her morning medication after she returned from dialysis. Pt states she will take her medication when she gets home. Pt is awaiting discharge.  I will continue to assess.

## 2015-10-10 NOTE — Progress Notes (Signed)
Post hd vitals 

## 2015-10-25 ENCOUNTER — Ambulatory Visit: Payer: Medicare Other | Admitting: Family

## 2015-11-01 ENCOUNTER — Ambulatory Visit: Payer: Medicare Other | Admitting: Family

## 2015-12-08 ENCOUNTER — Emergency Department: Payer: Medicare Other

## 2015-12-08 ENCOUNTER — Inpatient Hospital Stay
Admission: EM | Admit: 2015-12-08 | Discharge: 2015-12-10 | DRG: 291 | Disposition: A | Discharge status: Home / Self Care | Payer: Medicare Other | Attending: Internal Medicine | Admitting: Internal Medicine

## 2015-12-08 ENCOUNTER — Encounter: Payer: Self-pay | Admitting: Emergency Medicine

## 2015-12-08 DIAGNOSIS — Z885 Allergy status to narcotic agent status: Secondary | ICD-10-CM | POA: Diagnosis not present

## 2015-12-08 DIAGNOSIS — I248 Other forms of acute ischemic heart disease: Secondary | ICD-10-CM | POA: Diagnosis present

## 2015-12-08 DIAGNOSIS — J42 Unspecified chronic bronchitis: Secondary | ICD-10-CM

## 2015-12-08 DIAGNOSIS — D631 Anemia in chronic kidney disease: Secondary | ICD-10-CM | POA: Diagnosis present

## 2015-12-08 DIAGNOSIS — I5043 Acute on chronic combined systolic (congestive) and diastolic (congestive) heart failure: Secondary | ICD-10-CM

## 2015-12-08 DIAGNOSIS — J449 Chronic obstructive pulmonary disease, unspecified: Secondary | ICD-10-CM | POA: Diagnosis present

## 2015-12-08 DIAGNOSIS — N2581 Secondary hyperparathyroidism of renal origin: Secondary | ICD-10-CM | POA: Diagnosis present

## 2015-12-08 DIAGNOSIS — I2510 Atherosclerotic heart disease of native coronary artery without angina pectoris: Secondary | ICD-10-CM | POA: Diagnosis present

## 2015-12-08 DIAGNOSIS — N186 End stage renal disease: Secondary | ICD-10-CM

## 2015-12-08 DIAGNOSIS — I132 Hypertensive heart and chronic kidney disease with heart failure and with stage 5 chronic kidney disease, or end stage renal disease: Principal | ICD-10-CM | POA: Diagnosis present

## 2015-12-08 DIAGNOSIS — K219 Gastro-esophageal reflux disease without esophagitis: Secondary | ICD-10-CM | POA: Diagnosis present

## 2015-12-08 DIAGNOSIS — Z9119 Patient's noncompliance with other medical treatment and regimen: Secondary | ICD-10-CM | POA: Diagnosis not present

## 2015-12-08 DIAGNOSIS — R0902 Hypoxemia: Secondary | ICD-10-CM | POA: Diagnosis present

## 2015-12-08 DIAGNOSIS — Z7902 Long term (current) use of antithrombotics/antiplatelets: Secondary | ICD-10-CM | POA: Diagnosis not present

## 2015-12-08 DIAGNOSIS — Z91041 Radiographic dye allergy status: Secondary | ICD-10-CM

## 2015-12-08 DIAGNOSIS — G09 Sequelae of inflammatory diseases of central nervous system: Secondary | ICD-10-CM | POA: Diagnosis present

## 2015-12-08 DIAGNOSIS — Z992 Dependence on renal dialysis: Secondary | ICD-10-CM | POA: Diagnosis not present

## 2015-12-08 DIAGNOSIS — I509 Heart failure, unspecified: Secondary | ICD-10-CM

## 2015-12-08 DIAGNOSIS — I2489 Other forms of acute ischemic heart disease: Secondary | ICD-10-CM

## 2015-12-08 DIAGNOSIS — Z7951 Long term (current) use of inhaled steroids: Secondary | ICD-10-CM

## 2015-12-08 DIAGNOSIS — E875 Hyperkalemia: Secondary | ICD-10-CM | POA: Diagnosis present

## 2015-12-08 DIAGNOSIS — R0602 Shortness of breath: Secondary | ICD-10-CM | POA: Diagnosis present

## 2015-12-08 DIAGNOSIS — Z9115 Patient's noncompliance with renal dialysis: Secondary | ICD-10-CM | POA: Diagnosis not present

## 2015-12-08 DIAGNOSIS — H9193 Unspecified hearing loss, bilateral: Secondary | ICD-10-CM | POA: Diagnosis present

## 2015-12-08 LAB — COMPREHENSIVE METABOLIC PANEL
ALT: 15 U/L (ref 14–54)
ANION GAP: 14 (ref 5–15)
AST: 22 U/L (ref 15–41)
Albumin: 3.7 g/dL (ref 3.5–5.0)
Alkaline Phosphatase: 102 U/L (ref 38–126)
BILIRUBIN TOTAL: 0.7 mg/dL (ref 0.3–1.2)
BUN: 63 mg/dL — ABNORMAL HIGH (ref 6–20)
CO2: 27 mmol/L (ref 22–32)
Calcium: 9.2 mg/dL (ref 8.9–10.3)
Chloride: 96 mmol/L — ABNORMAL LOW (ref 101–111)
Creatinine, Ser: 10.8 mg/dL — ABNORMAL HIGH (ref 0.44–1.00)
GFR calc Af Amer: 4 mL/min — ABNORMAL LOW (ref >60)
GFR, EST NON AFRICAN AMERICAN: 3 mL/min — AB (ref >60)
Glucose, Bld: 95 mg/dL (ref 65–99)
POTASSIUM: 5.3 mmol/L — AB (ref 3.5–5.1)
Sodium: 137 mmol/L (ref 135–145)
TOTAL PROTEIN: 7.8 g/dL (ref 6.5–8.1)

## 2015-12-08 LAB — CBC
HCT: 19.7 % — ABNORMAL LOW (ref 35.0–47.0)
Hemoglobin: 6.8 g/dL — ABNORMAL LOW (ref 12.0–16.0)
MCH: 26.9 pg (ref 26.0–34.0)
MCHC: 34.3 g/dL (ref 32.0–36.0)
MCV: 78.4 fL — ABNORMAL LOW (ref 80.0–100.0)
Platelets: 246 10*3/uL (ref 150–440)
RBC: 2.51 MIL/uL — ABNORMAL LOW (ref 3.80–5.20)
RDW: 22.3 % — ABNORMAL HIGH (ref 11.5–14.5)
WBC: 6.4 10*3/uL (ref 3.6–11.0)

## 2015-12-08 LAB — CBC WITH DIFFERENTIAL/PLATELET
BASOS PCT: 1 %
Basophils Absolute: 0.1 10*3/uL (ref 0–0.1)
EOS ABS: 0.1 10*3/uL (ref 0–0.7)
Eosinophils Relative: 2 %
HCT: 22.4 % — ABNORMAL LOW (ref 35.0–47.0)
HEMOGLOBIN: 7.5 g/dL — AB (ref 12.0–16.0)
Lymphocytes Relative: 12 %
Lymphs Abs: 0.7 10*3/uL — ABNORMAL LOW (ref 1.0–3.6)
MCH: 26.1 pg (ref 26.0–34.0)
MCHC: 33.5 g/dL (ref 32.0–36.0)
MCV: 77.9 fL — ABNORMAL LOW (ref 80.0–100.0)
MONOS PCT: 10 %
Monocytes Absolute: 0.6 10*3/uL (ref 0.2–0.9)
NEUTROS PCT: 75 %
Neutro Abs: 4.7 10*3/uL (ref 1.4–6.5)
Platelets: 252 10*3/uL (ref 150–440)
RBC: 2.87 MIL/uL — AB (ref 3.80–5.20)
RDW: 22.3 % — ABNORMAL HIGH (ref 11.5–14.5)
WBC: 6.3 10*3/uL (ref 3.6–11.0)

## 2015-12-08 LAB — RENAL FUNCTION PANEL
Albumin: 3.5 g/dL (ref 3.5–5.0)
Anion gap: 16 — ABNORMAL HIGH (ref 5–15)
BUN: 67 mg/dL — ABNORMAL HIGH (ref 6–20)
CO2: 26 mmol/L (ref 22–32)
Calcium: 9.3 mg/dL (ref 8.9–10.3)
Chloride: 94 mmol/L — ABNORMAL LOW (ref 101–111)
Creatinine, Ser: 11.33 mg/dL — ABNORMAL HIGH (ref 0.44–1.00)
GFR calc Af Amer: 4 mL/min — ABNORMAL LOW (ref >60)
GFR calc non Af Amer: 3 mL/min — ABNORMAL LOW (ref >60)
Glucose, Bld: 89 mg/dL (ref 65–99)
Phosphorus: 11.9 mg/dL — ABNORMAL HIGH (ref 2.5–4.6)
Potassium: 5.8 mmol/L — ABNORMAL HIGH (ref 3.5–5.1)
Sodium: 136 mmol/L (ref 135–145)

## 2015-12-08 LAB — GLUCOSE, CAPILLARY: GLUCOSE-CAPILLARY: 110 mg/dL — AB (ref 65–99)

## 2015-12-08 LAB — TROPONIN I
TROPONIN I: 0.07 ng/mL — AB (ref <0.03)
TROPONIN I: 0.07 ng/mL — AB (ref <0.03)
Troponin I: 0.07 ng/mL (ref <0.03)
Troponin I: 0.07 ng/mL (ref <0.03)

## 2015-12-08 LAB — MRSA PCR SCREENING: MRSA by PCR: POSITIVE — AB

## 2015-12-08 LAB — BRAIN NATRIURETIC PEPTIDE: B NATRIURETIC PEPTIDE 5: 3011 pg/mL — AB (ref 0.0–100.0)

## 2015-12-08 MED ORDER — FLUTICASONE PROPIONATE 50 MCG/ACT NA SUSP
1.0000 | Freq: Every day | NASAL | Status: DC
  Administered 2015-12-08: 1 via NASAL
  Filled 2015-12-08: qty 16

## 2015-12-08 MED ORDER — SODIUM CHLORIDE 0.9 % IV SOLN
250.0000 mL | INTRAVENOUS | Status: DC | PRN
Start: 2015-12-08 — End: 2015-12-10

## 2015-12-08 MED ORDER — METOPROLOL TARTRATE 25 MG PO TABS
12.5000 mg | ORAL_TABLET | Freq: Two times a day (BID) | ORAL | Status: DC
  Administered 2015-12-08 – 2015-12-10 (×4): 12.5 mg via ORAL
  Filled 2015-12-08 (×4): qty 1

## 2015-12-08 MED ORDER — SODIUM CHLORIDE 0.9% FLUSH
3.0000 mL | Freq: Two times a day (BID) | INTRAVENOUS | Status: DC
Start: 2015-12-08 — End: 2015-12-10
  Administered 2015-12-08 – 2015-12-09 (×2): 3 mL via INTRAVENOUS

## 2015-12-08 MED ORDER — AMLODIPINE BESYLATE 5 MG PO TABS
5.0000 mg | ORAL_TABLET | Freq: Every day | ORAL | Status: DC
  Administered 2015-12-08 – 2015-12-10 (×2): 5 mg via ORAL
  Filled 2015-12-08 (×2): qty 1

## 2015-12-08 MED ORDER — ONDANSETRON HCL 4 MG/2ML IJ SOLN: 4.0000 mg | Freq: Four times a day (QID) | INTRAMUSCULAR | Status: DC | PRN

## 2015-12-08 MED ORDER — CINACALCET HCL 30 MG PO TABS
60.0000 mg | ORAL_TABLET | Freq: Every day | ORAL | Status: DC
  Administered 2015-12-08: 60 mg via ORAL
  Filled 2015-12-08 (×2): qty 2

## 2015-12-08 MED ORDER — RENA-VITE PO TABS
1.0000 | ORAL_TABLET | Freq: Every day | ORAL | Status: DC
  Administered 2015-12-09: 1 via ORAL
  Filled 2015-12-08 (×2): qty 1

## 2015-12-08 MED ORDER — MOMETASONE FURO-FORMOTEROL FUM 200-5 MCG/ACT IN AERO
2.0000 | INHALATION_SPRAY | Freq: Two times a day (BID) | RESPIRATORY_TRACT | Status: DC
  Administered 2015-12-08 (×2): 2 via RESPIRATORY_TRACT
  Filled 2015-12-08: qty 8.8

## 2015-12-08 MED ORDER — ALBUTEROL SULFATE (2.5 MG/3ML) 0.083% IN NEBU: 5.0000 mg | INHALATION_SOLUTION | Freq: Once | RESPIRATORY_TRACT | Status: DC

## 2015-12-08 MED ORDER — CHLORHEXIDINE GLUCONATE CLOTH 2 % EX PADS
6.0000 | MEDICATED_PAD | Freq: Every day | CUTANEOUS | Status: DC
  Administered 2015-12-08: 6 via TOPICAL

## 2015-12-08 MED ORDER — CALCIUM ACETATE (PHOS BINDER) 667 MG PO CAPS
667.0000 mg | ORAL_CAPSULE | Freq: Every day | ORAL | Status: DC
  Administered 2015-12-08 (×3): 1334 mg via ORAL
  Filled 2015-12-08: qty 2
  Filled 2015-12-08: qty 1
  Filled 2015-12-08 (×2): qty 2

## 2015-12-08 MED ORDER — CALCIUM ACETATE (PHOS BINDER) 667 MG PO CAPS: 444889.0000 mg | ORAL_CAPSULE | Freq: Every day | ORAL | Status: DC

## 2015-12-08 MED ORDER — HYDRALAZINE HCL 20 MG/ML IJ SOLN
10.0000 mg | Freq: Four times a day (QID) | INTRAMUSCULAR | Status: DC | PRN
  Administered 2015-12-09 – 2015-12-10 (×2): 10 mg via INTRAVENOUS
  Filled 2015-12-08 (×2): qty 1

## 2015-12-08 MED ORDER — SODIUM CHLORIDE 0.9% FLUSH
3.0000 mL | Freq: Two times a day (BID) | INTRAVENOUS | Status: DC
  Administered 2015-12-08 – 2015-12-09 (×2): 3 mL via INTRAVENOUS

## 2015-12-08 MED ORDER — TIOTROPIUM BROMIDE MONOHYDRATE 18 MCG IN CAPS
18.0000 ug | ORAL_CAPSULE | Freq: Every day | RESPIRATORY_TRACT | Status: DC
  Administered 2015-12-08: 18 ug via RESPIRATORY_TRACT
  Filled 2015-12-08: qty 5

## 2015-12-08 MED ORDER — FUROSEMIDE 80 MG PO TABS
80.0000 mg | ORAL_TABLET | ORAL | Status: DC
  Administered 2015-12-08 – 2015-12-10 (×2): 80 mg via ORAL
  Filled 2015-12-08: qty 2
  Filled 2015-12-08: qty 1

## 2015-12-08 MED ORDER — IPRATROPIUM-ALBUTEROL 0.5-2.5 (3) MG/3ML IN SOLN
3.0000 mL | Freq: Once | RESPIRATORY_TRACT | Status: AC
Start: 2015-12-08 — End: 2015-12-08
  Administered 2015-12-08: 3 mL via RESPIRATORY_TRACT
  Filled 2015-12-08: qty 3

## 2015-12-08 MED ORDER — ONDANSETRON HCL 4 MG PO TABS: 4.0000 mg | ORAL_TABLET | Freq: Four times a day (QID) | ORAL | Status: DC | PRN

## 2015-12-08 MED ORDER — HEPARIN SODIUM (PORCINE) 5000 UNIT/ML IJ SOLN
5000.0000 [IU] | Freq: Three times a day (TID) | INTRAMUSCULAR | Status: DC
  Filled 2015-12-08 (×2): qty 1

## 2015-12-08 MED ORDER — DIALYVITE 3000 3 MG PO TABS: 1.0000 | ORAL_TABLET | Freq: Every day | ORAL | Status: DC

## 2015-12-08 MED ORDER — ACETAMINOPHEN 325 MG PO TABS
650.0000 mg | ORAL_TABLET | Freq: Four times a day (QID) | ORAL | Status: DC | PRN
  Administered 2015-12-08: 650 mg via ORAL
  Filled 2015-12-08: qty 2

## 2015-12-08 MED ORDER — CLOPIDOGREL BISULFATE 75 MG PO TABS
75.0000 mg | ORAL_TABLET | Freq: Every day | ORAL | Status: DC
  Administered 2015-12-08 – 2015-12-10 (×2): 75 mg via ORAL
  Filled 2015-12-08 (×2): qty 1

## 2015-12-08 MED ORDER — EPOETIN ALFA 10000 UNIT/ML IJ SOLN
10000.0000 [IU] | Freq: Once | INTRAMUSCULAR | Status: AC
  Administered 2015-12-08: 10000 [IU] via INTRAVENOUS

## 2015-12-08 MED ORDER — ISOSORBIDE MONONITRATE ER 30 MG PO TB24
30.0000 mg | ORAL_TABLET | Freq: Every day | ORAL | Status: DC
  Administered 2015-12-08 – 2015-12-10 (×2): 30 mg via ORAL
  Filled 2015-12-08 (×2): qty 1

## 2015-12-08 MED ORDER — SODIUM CHLORIDE 0.9% FLUSH: 3.0000 mL | INTRAVENOUS | Status: DC | PRN

## 2015-12-08 MED ORDER — IPRATROPIUM-ALBUTEROL 0.5-2.5 (3) MG/3ML IN SOLN
3.0000 mL | Freq: Once | RESPIRATORY_TRACT | Status: AC
  Administered 2015-12-08: 3 mL via RESPIRATORY_TRACT
  Filled 2015-12-08: qty 3

## 2015-12-08 MED ORDER — MUPIROCIN 2 % EX OINT
1.0000 "application " | TOPICAL_OINTMENT | Freq: Two times a day (BID) | CUTANEOUS | Status: DC
  Administered 2015-12-08 – 2015-12-09 (×2): 1 via NASAL
  Filled 2015-12-08: qty 22

## 2015-12-08 MED ORDER — IPRATROPIUM-ALBUTEROL 0.5-2.5 (3) MG/3ML IN SOLN
3.0000 mL | RESPIRATORY_TRACT | Status: DC | PRN
  Administered 2015-12-08: 3 mL via RESPIRATORY_TRACT
  Filled 2015-12-08: qty 3

## 2015-12-08 MED ORDER — SENNOSIDES-DOCUSATE SODIUM 8.6-50 MG PO TABS: 1.0000 | ORAL_TABLET | Freq: Every evening | ORAL | Status: DC | PRN

## 2015-12-08 MED ORDER — FUROSEMIDE 10 MG/ML IJ SOLN
40.0000 mg | Freq: Once | INTRAMUSCULAR | Status: AC
  Administered 2015-12-08: 40 mg via INTRAVENOUS
  Filled 2015-12-08: qty 4

## 2015-12-08 MED ORDER — AMIODARONE HCL 200 MG PO TABS
200.0000 mg | ORAL_TABLET | Freq: Every day | ORAL | Status: DC
  Administered 2015-12-08 – 2015-12-10 (×2): 200 mg via ORAL
  Filled 2015-12-08 (×3): qty 1

## 2015-12-08 MED ORDER — PANTOPRAZOLE SODIUM 40 MG PO TBEC
40.0000 mg | DELAYED_RELEASE_TABLET | Freq: Two times a day (BID) | ORAL | Status: DC
  Administered 2015-12-08 – 2015-12-09 (×2): 40 mg via ORAL
  Filled 2015-12-08 (×4): qty 1

## 2015-12-08 MED ORDER — ALBUTEROL SULFATE (2.5 MG/3ML) 0.083% IN NEBU
3.0000 mL | INHALATION_SOLUTION | Freq: Four times a day (QID) | RESPIRATORY_TRACT | Status: DC | PRN
  Administered 2015-12-09: 3 mL via RESPIRATORY_TRACT
  Filled 2015-12-08: qty 3

## 2015-12-08 MED ORDER — ONDANSETRON HCL 4 MG PO TABS: 4.0000 mg | ORAL_TABLET | Freq: Three times a day (TID) | ORAL | Status: DC | PRN

## 2015-12-08 MED ORDER — ACETAMINOPHEN 650 MG RE SUPP
650.0000 mg | Freq: Four times a day (QID) | RECTAL | Status: DC | PRN
Start: 2015-12-08 — End: 2015-12-10

## 2015-12-08 NOTE — ED Notes (Addendum)
Pt transported to room 234. 

## 2015-12-08 NOTE — H&P (Signed)
[facility] [place] NAME: Kelsey Peterson    MR#:  [id number]  DATE OF BIRTH:  [birth year]  DATE OF ADMISSION:  12/08/2015  PRIMARY CARE PHYSICIAN: No PCP Per Patient   REQUESTING/REFERRING PHYSICIAN:   CHIEF COMPLAINT:   Chief Complaint  Patient presents with  . Shortness of Breath    HISTORY OF PRESENT ILLNESS: Kelsey Peterson  is a 60 y.o. female with a known history of Anemia of chronic disease, end-stage renal disease on dialysis, coronary artery disease, systolic heart failure with EF of 45% COPD, GERD, hepatitis C presented to the emergency room with increased shortness of breath since last 1 day. Patient missed dialysis on Saturday. She usually gets dialyzed on Tuesday and Thursday and Saturday and she has a fistula on the right arm. She has some orthopnea and proximal nocturnal dyspnea but no wheezing. She felt short of breath and came to the emergency room. No complaints of any chest pain per se. Her troponin was slightly elevated which could be secondary to demand ischemia or secondary to renal failure. No history of fever, chills and cough. No history of headache dizziness or blurry vision.  PAST MEDICAL HISTORY:   Past Medical History:  Diagnosis Date  . Anemia of chronic disease   . CAD (coronary artery disease)   . Chronic systolic CHF (congestive heart failure) (HCC)    a. EF 45% by echo in 06/2014  . Cognitive impairment   . COPD (chronic obstructive pulmonary disease) ([place]) 07/14/2014  . ESRD on hemodialysis ([place])   . GERD (gastroesophageal reflux disease) 07/14/2014  . Hepatitis C   . HOH (hard of hearing)   . HTN (hypertension)   . Migraine with aura   . Mitral regurgitation    a. calcified mitral annulus, mod MR by echo in 06/2014  . Paroxysmal SVT (supraventricular tachycardia) ([place])    a. history of this, with last known occurrence in 06/2014  . Secondary hyperparathyroidism of renal origin [facility])      PAST SURGICAL HISTORY: Past Surgical History:  Procedure Laterality Date  . COLONOSCOPY N/A 07/15/2014   Procedure: COLONOSCOPY;  Surgeon: Manya Silvas, MD;  Location: [facility] ENDOSCOPY;  Service: Endoscopy;  Laterality: N/A;  . COLONOSCOPY WITH PROPOFOL Left 07/09/2014   Procedure: COLONOSCOPY WITH PROPOFOL;  Surgeon: Hulen Luster, MD;  Location: [facility] ENDOSCOPY;  Service: Endoscopy;  Laterality: Left;  . DIALYSIS FISTULA CREATION    . ESOPHAGOGASTRODUODENOSCOPY N/A 04/22/2015   Procedure: ESOPHAGOGASTRODUODENOSCOPY (EGD);  Surgeon: Manya Silvas, MD;  Location: [facility] ENDOSCOPY;  Service: Endoscopy;  Laterality: N/A;  . PERIPHERAL VASCULAR CATHETERIZATION N/A 10/08/2015   Procedure: A/V Shuntogram/Fistulagram;  Surgeon: Katha Cabal, MD;  Location: [place] CV LAB;  Service: Cardiovascular;  Laterality: N/A;  . TUBAL LIGATION      SOCIAL HISTORY:  Social History  Substance Use Topics  . Smoking status: Never Smoker  . Smokeless tobacco: Never Used  . Alcohol use No    FAMILY HISTORY:  Family History  Problem Relation Age of Onset  . Heart disease Mother   . Hypertension Mother     DRUG ALLERGIES:  Allergies  Allergen Reactions  . Contrast Media [Iodinated Diagnostic Agents] Other (See Comments)    Unknown reaction  . Morphine And Related Hives  . Other Rash    Blood Pressure Pill (Pt doesn't remember name)     REVIEW OF SYSTEMS:   CONSTITUTIONAL: No fever, fatigue or weakness.  EYES:  No blurred or double vision.  EARS, NOSE, AND THROAT: No tinnitus or ear pain.  RESPIRATORY: No cough,  Has shortness of breath,  No wheezing or hemoptysis.  CARDIOVASCULAR: No chest pain, has orthopnea,  No edema.  GASTROINTESTINAL: No nausea, vomiting, diarrhea or abdominal pain.  GENITOURINARY: No dysuria, hematuria.  ENDOCRINE: No polyuria, nocturia,  HEMATOLOGY: No anemia, easy bruising or bleeding SKIN: No rash or lesion. MUSCULOSKELETAL: No joint pain or arthritis.    NEUROLOGIC: No tingling, numbness, weakness.  PSYCHIATRY: No anxiety or depression.   MEDICATIONS AT HOME:  Prior to Admission medications   Medication Sig Start Date End Date Taking? Authorizing Provider  albuterol (PROVENTIL HFA;VENTOLIN HFA) 108 (90 Base) MCG/ACT inhaler Inhale 2 puffs into the lungs every 6 (six) hours as needed for wheezing or shortness of breath. 04/04/15   Hillary Bow, MD  amiodarone (PACERONE) 200 MG tablet Take 1 tablet (200 mg total) by mouth daily. 04/04/15   Srikar Sudini, MD  amLODipine (NORVASC) 5 MG tablet Take 5 mg by mouth daily.    Historical Provider, MD  calcium acetate (PHOSLO) 667 MG capsule Take [phone] capsules by mouth 5 (five) times daily. 1334 mg 3 times daily with meals and 667 mg 2 times daily with snacks    Historical Provider, MD  cinacalcet (SENSIPAR) 60 MG tablet Take 60 mg by mouth daily.    Historical Provider, MD  clopidogrel (PLAVIX) 75 MG tablet Take 75 mg by mouth daily. 08/31/14   Historical Provider, MD  Fluticasone-Salmeterol (ADVAIR DISKUS) 250-50 MCG/DOSE AEPB Inhale 1 puff into the lungs 2 (two) times daily. 04/04/15   Hillary Bow, MD  folic acid-vitamin b complex-vitamin c-selenium-zinc (DIALYVITE) 3 MG TABS tablet Take 1 tablet by mouth daily.    Historical Provider, MD  furosemide (LASIX) 80 MG tablet Take 80 mg by mouth every Tuesday, Thursday, Saturday, and Sunday.     Historical Provider, MD  ipratropium-albuterol (DUONEB) 0.5-2.5 (3) MG/3ML SOLN Take 3 mLs by nebulization every 4 (four) hours as needed. Patient taking differently: Take 3 mLs by nebulization every 4 (four) hours as needed. For wheezing/shortness of breath 04/23/15   Vaughan Basta, MD  isosorbide mononitrate (IMDUR) 30 MG 24 hr tablet Take 30 mg by mouth daily.    Historical Provider, MD  metoprolol tartrate (LOPRESSOR) 25 MG tablet Take 12.5 mg by mouth 2 (two) times daily. 10/21/14   Historical Provider, MD  ondansetron (ZOFRAN) 4 MG tablet Take 4 mg by  mouth every 8 (eight) hours as needed for nausea or vomiting. Reported on 02/05/2015    Historical Provider, MD  pantoprazole (PROTONIX) 40 MG tablet Take 1 tablet (40 mg total) by mouth 2 (two) times daily. 04/23/15   Vaughan Basta, MD  predniSONE (DELTASONE) 5 MG tablet Take one tab po daily for two days then 1/2 tab po daily for two days 10/10/15   Loletha Grayer, MD  tiotropium (SPIRIVA) 18 MCG inhalation capsule Place 18 mcg into inhaler and inhale daily.    Historical Provider, MD      PHYSICAL EXAMINATION:   VITAL SIGNS: Blood pressure (!) 181/93, pulse 81, temperature 97.4 F (36.3 C), temperature source Oral, resp. rate (!) 28, height 5\' 2"  (1.575 m), weight 77.6 kg (171 lb), SpO2 100 %.  GENERAL:  60 y.o.-year-old patient lying in the bed with no acute distress.  EYES: Pupils equal, round, reactive to light and accommodation. No scleral icterus. Extraocular muscles intact.  HEENT: Head atraumatic, normocephalic. Oropharynx and nasopharynx clear.  NECK:  Supple, no jugular venous distention. No thyroid enlargement, no tenderness.  LUNGS: Decreased breath sounds bilaterally, bilateral basal crepitations heard. No use of accessory muscles of respiration.  CARDIOVASCULAR: S1, S2 normal. No murmurs, rubs, or gallops.  ABDOMEN: Soft, nontender, nondistended. Bowel sounds present. No organomegaly or mass.  EXTREMITIES: No pedal edema, cyanosis, or clubbing.  Fistula noted right arm NEUROLOGIC: Cranial nerves II through XII are intact. Muscle strength 5/5 in all extremities. Sensation intact. Gait not checked.  PSYCHIATRIC: The patient is alert and oriented x 3.  SKIN: No obvious rash, lesion, or ulcer.   LABORATORY PANEL:   CBC  Recent Labs Lab 12/08/15 0117  WBC 6.3  HGB 7.5*  HCT 22.4*  PLT 252  MCV 77.9*  MCH 26.1  MCHC 33.5  RDW 22.3*  LYMPHSABS 0.7*  MONOABS 0.6  EOSABS 0.1  BASOSABS 0.1    ------------------------------------------------------------------------------------------------------------------  Chemistries   Recent Labs Lab 12/08/15 0117  NA 137  K 5.3*  CL 96*  CO2 27  GLUCOSE 95  BUN 63*  CREATININE 10.80*  CALCIUM 9.2  AST 22  ALT 15  ALKPHOS 102  BILITOT 0.7   ------------------------------------------------------------------------------------------------------------------ estimated creatinine clearance is 5.3 mL/min (by C-G formula based on SCr of 10.8 mg/dL (H)). ------------------------------------------------------------------------------------------------------------------ No results for input(s): TSH, T4TOTAL, T3FREE, THYROIDAB in the last 72 hours.  Invalid input(s): FREET3   Coagulation profile No results for input(s): INR, PROTIME in the last 168 hours. ------------------------------------------------------------------------------------------------------------------- No results for input(s): DDIMER in the last 72 hours. -------------------------------------------------------------------------------------------------------------------  Cardiac Enzymes  Recent Labs Lab 12/08/15 0117  TROPONINI 0.07*   ------------------------------------------------------------------------------------------------------------------ Invalid input(s): POCBNP  ---------------------------------------------------------------------------------------------------------------  Urinalysis    Component Value Date/Time   COLORURINE YELLOW (A) 12/01/2014 1209   APPEARANCEUR TURBID (A) 12/01/2014 1209   APPEARANCEUR Clear 08/23/2013 1535   LABSPEC 1.016 12/01/2014 1209   LABSPEC 1.010 08/23/2013 1535   PHURINE 7.0 12/01/2014 [place] 12/01/2014 1209   GLUCOSEU Negative 08/23/2013 1535   HGBUR 2+ (A) 12/01/2014 1209   BILIRUBINUR NEGATIVE 12/01/2014 1209   BILIRUBINUR Negative 08/23/2013 [place] 12/01/2014 1209    PROTEINUR >500 (A) 12/01/2014 1209   NITRITE NEGATIVE 12/01/2014 1209   LEUKOCYTESUR 2+ (A) 12/01/2014 1209   LEUKOCYTESUR Trace 08/23/2013 1535     RADIOLOGY: Dg Chest Port 1 View  Result Date: 12/08/2015 CLINICAL DATA:  Shortness of breath. EXAM: PORTABLE CHEST 1 VIEW COMPARISON:  October 07, 2015 FINDINGS: Stable cardiomegaly. The left-sided pleural effusion and underlying opacity seen on the previous study remains but is slightly smaller in the interval. Pulmonary venous congestion without overt edema. No other acute abnormalities. IMPRESSION: Persistent left effusion and underlying infiltrate, persistent but improved in the interval. Mild pulmonary venous congestion and stable cardiomegaly. Electronically Signed   By: Dorise Bullion III M.D   On: 12/08/2015 01:41    EKG: Orders placed or performed during the hospital encounter of 12/08/15  . ED EKG  . ED EKG    IMPRESSION AND PLAN: 60 year old female patient with history of end-stage renal disease on dialysis, hypertension, anemia of chronic disease, systolic heart failure with EF of 45%, GERD presented to the emergency room with difficulty breathing. Patient missed dialysis on Saturday. Admitting diagnosis 1. Acute on chronic heart failure exacerbation 2. End-stage renal disease on dialysis 3. Anemia of chronic disease 4. GERD 5. Hypertension Treatment plan Admit patient to telemetry Diurese patient with Lasix, patient still makes little urine Nephrology consultation for dialysis Monitor hemoglobin and  hematocrit DVT prophylaxis subcutaneous heparin Resume home medications Oxygen via nasal cannula Supportive care.  All the records are reviewed and case discussed with ED provider. Management plans discussed with the patient, family and they are in agreement.  CODE STATUS:FULL CODE Code Status History    Date Active Date Inactive Code Status Order ID Comments User Context   10/07/2015  7:21 PM 10/10/2015  7:20 PM Full  Code [id number]  Hillary Bow, MD ED   07/29/2015  6:11 PM 07/31/2015  8:49 PM Full Code [id number]  Lytle Butte, MD ED   04/18/2015 11:33 PM 04/23/2015  8:26 PM Full Code [id number]  Sylvan Cheese, MD Inpatient   04/02/2015  3:21 AM 04/04/2015  8:25 PM Full Code [id number]  Lance Coon, MD Inpatient   02/05/2015  9:17 AM 02/08/2015  8:00 PM Full Code [id number]  Fritzi Mandes, MD Inpatient   12/01/2014  3:08 PM 12/07/2014  7:15 PM Full Code [id number]  Demetrios Loll, MD Inpatient   09/01/2014  5:57 PM 09/06/2014  2:09 PM Full Code [id number]  Demetrios Loll, MD ED   07/14/2014  5:17 AM 07/16/2014  6:40 PM Full Code [id number]  Lance Coon, MD Inpatient   07/03/2014  2:06 PM 07/10/2014  7:11 PM Full Code [id number]  Demetrios Loll, MD Inpatient   06/21/2014  1:35 PM 06/22/2014  4:33 PM Full Code [id number]  Max Sane, MD Inpatient       TOTAL TIME TAKING CARE OF THIS PATIENT: 54 minutes.    Saundra Shelling M.D on 12/08/2015 at 3:16 AM  Between 7am to 6pm - Pager - [phone]  After 6pm go to [email or web address] - password [email or web address] [facility]  [facility]  Office  [phone]  CC: Primary care physician; No PCP Per Patient

## 2015-12-08 NOTE — Progress Notes (Signed)
Pre hd assessment  

## 2015-12-08 NOTE — Progress Notes (Signed)
  End of hd 

## 2015-12-08 NOTE — Progress Notes (Signed)
[facility]  ROUNDING NOTE   Subjective:   Kelsey Peterson presents on 12/08/2015 for Shortness of breath [R06.02] Demand ischemia ([place]) [I24.8] ESRD (end stage renal disease) on dialysis ([place]) [N18.6, Z99.2] Acute on chronic combined systolic and diastolic congestive heart failure (HCC) [I50.43] Chronic bronchitis, unspecified chronic bronchitis type ([place]) [J42]   Missed dialysis yesterday. And now is short of breath. She is asking for treatment today.   Objective:  Vital signs in last 24 hours:  Temp:  [97.4 F (36.3 C)-98.3 F (36.8 C)] 98.3 F (36.8 C) (11/19 0805) Pulse Rate:  [78-99] 99 (11/19 0805) Resp:  [16-28] 16 (11/19 0805) BP: (178-187)/(89-122) 186/122 (11/19 0805) SpO2:  [85 %-100 %] 100 % (11/19 0805) Weight:  [73.3 kg (161 lb 11.2 oz)-77.6 kg (171 lb)] 73.3 kg (161 lb 11.2 oz) (11/19 0449)  Weight change:  Filed Weights   12/08/15 0105 12/08/15 0449  Weight: 77.6 kg (171 lb) 73.3 kg (161 lb 11.2 oz)    Intake/Output: I/O last 3 completed shifts: In: -  Out: 50 [Urine:50]   Intake/Output this shift:  No intake/output data recorded.  Physical Exam: General: NAD,   Head: Decreased hearing   Eyes: Anicteric, PERRL  Neck: Supple, trachea midline  Lungs:  Clear to auscultation  Heart: Regular rate and rhythm  Abdomen:  Soft, nontender,   Extremities: no peripheral edema.  Neurologic: Nonfocal, moving all four extremities  Skin: No lesions  Access: Left AVG    Basic Metabolic Panel:  Recent Labs Lab 12/08/15 0117  NA 137  K 5.3*  CL 96*  CO2 27  GLUCOSE 95  BUN 63*  CREATININE 10.80*  CALCIUM 9.2    Liver Function Tests:  Recent Labs Lab 12/08/15 0117  AST 22  ALT 15  ALKPHOS 102  BILITOT 0.7  PROT 7.8  ALBUMIN 3.7   No results for input(s): LIPASE, AMYLASE in the last 168 hours. No results for input(s): AMMONIA in the last 168 hours.  CBC:  Recent Labs Lab 12/08/15 0117  WBC 6.3  NEUTROABS 4.7  HGB  7.5*  HCT 22.4*  MCV 77.9*  PLT 252    Cardiac Enzymes:  Recent Labs Lab 12/08/15 0117 12/08/15 0725  TROPONINI 0.07* 0.07*    BNP: Invalid input(s): POCBNP  CBG:  Recent Labs Lab 12/08/15 0502  GLUCAP 110*    Microbiology: Results for orders placed or performed during the hospital encounter of 12/08/15  MRSA PCR Screening     Status: Abnormal   Collection Time: 12/08/15  4:52 AM  Result Value Ref Range Status   MRSA by PCR POSITIVE (A) NEGATIVE Final    Comment:        The GeneXpert MRSA Assay (FDA approved for NASAL specimens only), is one component of a comprehensive MRSA colonization surveillance program. It is not intended to diagnose MRSA infection nor to guide or monitor treatment for MRSA infections. RESULT CALLED TO, READ BACK BY AND VERIFIED WITH: MARCEL TURNER AT 0617 ON 12/08/15 RWW     Coagulation Studies: No results for input(s): LABPROT, INR in the last 72 hours.  Urinalysis: No results for input(s): COLORURINE, LABSPEC, PHURINE, GLUCOSEU, HGBUR, BILIRUBINUR, KETONESUR, PROTEINUR, UROBILINOGEN, NITRITE, LEUKOCYTESUR in the last 72 hours.  Invalid input(s): APPERANCEUR    Imaging: Dg Chest Port 1 View  Result Date: 12/08/2015 CLINICAL DATA:  Shortness of breath. EXAM: PORTABLE CHEST 1 VIEW COMPARISON:  October 07, 2015 FINDINGS: Stable cardiomegaly. The left-sided pleural effusion and underlying opacity seen on  the previous study remains but is slightly smaller in the interval. Pulmonary venous congestion without overt edema. No other acute abnormalities. IMPRESSION: Persistent left effusion and underlying infiltrate, persistent but improved in the interval. Mild pulmonary venous congestion and stable cardiomegaly. Electronically Signed   By: Dorise Bullion III M.D   On: 12/08/2015 01:41     Medications:    . amiodarone  200 mg Oral Daily  . amLODipine  5 mg Oral Daily  . calcium acetate  667-1,334 mg Oral 5 X Daily  .  Chlorhexidine Gluconate Cloth  6 each Topical Q0600  . cinacalcet  60 mg Oral Daily  . clopidogrel  75 mg Oral Daily  . fluticasone  1 spray Each Nare Daily  . furosemide  80 mg Oral Q T,Th,S,Su  . heparin  5,000 Units Subcutaneous Q8H  . ipratropium-albuterol  3 mL Nebulization Once  . isosorbide mononitrate  30 mg Oral Daily  . metoprolol tartrate  12.5 mg Oral BID  . mometasone-formoterol  2 puff Inhalation BID  . multivitamin  1 tablet Oral QHS  . mupirocin ointment  1 application Nasal BID  . pantoprazole  40 mg Oral BID  . sodium chloride flush  3 mL Intravenous Q12H  . sodium chloride flush  3 mL Intravenous Q12H  . tiotropium  18 mcg Inhalation Daily   sodium chloride, acetaminophen **OR** acetaminophen, albuterol, hydrALAZINE, ipratropium-albuterol, ondansetron **OR** ondansetron (ZOFRAN) IV, senna-docusate, sodium chloride flush  Assessment/ Plan:  Kelsey Peterson is a 60 y.o. black female with hypertension, anemia of chronic kidney disease, ESRD on HD TTHS, secondary hyperparathyroidism, hx of meningitis as a child with resultant hearing loss,  TTS [facility] [place]   1. End Stage Renal Disease: missed dialysis yesterday. Will schedule dialysis for later today. Orders prepared.   2. Hypertension: elevated.  - restart home regimen of amlodipine, furosemide, metoprolol, imdur  3. Anemia of chronic kidney disease: unable to take IV iron  - epo with treatment  4. Secondary Hyperparathyroidism: with hyperphosphatemia. Outpatient PTH 1558, phosphorus 11.4,  - restarted cinacalcet and calcium acetate.   LOS: 0 Richardson Dubree 11/19/201711:08 AM

## 2015-12-08 NOTE — Progress Notes (Signed)
Post hd assessment 

## 2015-12-08 NOTE — Progress Notes (Signed)
Hd start 

## 2015-12-08 NOTE — Progress Notes (Signed)
PT Attempt Note  Patient Details Name: Kelsey Peterson MRN: [id number] DOB: 1956/01/05   Cancelled Treatment:    Reason Eval/Treat Not Completed: Patient at procedure or test/unavailable. Chart reviewed. RN contacted. Pt out of room for HD. Will attempt PT evaluation on later date as pt is available.  Lyndel Safe Momin Misko PT, DPT   Evolette Pendell 12/08/2015, 2:28 PM

## 2015-12-08 NOTE — Progress Notes (Signed)
Patient was admitted this morning, refused telemetry and bed alarm. No complaints of pain. FSBS is wnl. Blood pressure remain is elevated but stable. Will continue to monitor and assess.

## 2015-12-08 NOTE — Care Management (Signed)
Patient presented from home with shortness of breath.  Oxygen sats 85% upon arrival.  she does not have chronic 02 in the home.  Followed by [facility] [place].  It is reported says she missed her dialysis session due to issues with her fistula that will not be addressed until after the holidays.  It is also reported she says she missed her treatment due to Holiday schedule.  CM has made many attempts at setting up home health in the past but patient does not allow them to come.  has been followed by [facility] in the pat.  Clayborn Bigness is listed as her PCP, but practice informed this CM during a previous admission that patient was "fired" by the practice.  Unable to determine today if the difference may have been resolve or if she obtained another PCP.  Notified Alda Lea of admission and faxed H/P

## 2015-12-08 NOTE — Progress Notes (Addendum)
[facility] NAME: Kelsey Peterson    MR#:  [id number]  DATE OF BIRTH:  [birth year]  SUBJECTIVE:  CHIEF COMPLAINT:   Chief Complaint  Patient presents with  . Shortness of Breath   - Dialysis patient, Missed her dialysis yesterday due to holiday schedule -Came in with shortness of breath and noted to have pulmonary edema -Multiple admissions for the same. Very poor historian. Refusing medications, telemetry monitoring  REVIEW OF SYSTEMS:  Review of Systems  Constitutional: Positive for malaise/fatigue. Negative for chills and fever.  HENT: Negative for congestion, ear discharge, hearing loss and nosebleeds.   Eyes: Negative for blurred vision.  Respiratory: Positive for shortness of breath. Negative for cough and wheezing.   Cardiovascular: Negative for chest pain, palpitations and leg swelling.  Gastrointestinal: Negative for abdominal pain, constipation, diarrhea, nausea and vomiting.  Genitourinary: Negative for dysuria.  Musculoskeletal: Negative for myalgias.  Neurological: Negative for dizziness, speech change, focal weakness, seizures and headaches.  Psychiatric/Behavioral: Negative for depression.    DRUG ALLERGIES:   Allergies  Allergen Reactions  . Contrast Media [Iodinated Diagnostic Agents] Other (See Comments)    Unknown reaction  . Morphine And Related Hives  . Other Rash    Blood Pressure Pill (Pt doesn't remember name)     VITALS:  Blood pressure (!) 186/122, pulse 99, temperature 98.3 F (36.8 C), temperature source Oral, resp. rate 16, height 5\' 2"  (1.575 m), weight 73.3 kg (161 lb 11.2 oz), SpO2 100 %.  PHYSICAL EXAMINATION:  Physical Exam  GENERAL:  60 y.o.-year-old patient lying in the bed with no acute distress.  EYES: Pupils equal, round, reactive to light and accommodation. No scleral icterus. Extraocular muscles intact.  HEENT: Head atraumatic, normocephalic. Oropharynx and nasopharynx  clear.  NECK:  Supple, no jugular venous distention. No thyroid enlargement, no tenderness.  LUNGS: Normal breath sounds bilaterally, no wheezing, rales,rhonchi or crepitation. No use of accessory muscles of respiration. Bibasilar crackles CARDIOVASCULAR: S1, S2 normal. No  rubs, or gallops. 2/6 systolic murmur ABDOMEN: Soft, nontender, nondistended. Bowel sounds present. No organomegaly or mass.  EXTREMITIES: No pedal edema, cyanosis, or clubbing.  NEUROLOGIC: Cranial nerves II through XII are intact. Muscle strength 5/5 in all extremities. Sensation intact. Gait not checked. Global weakness PSYCHIATRIC: The patient is alert and oriented x 3.  SKIN: No obvious rash, lesion, or ulcer.    LABORATORY PANEL:   CBC  Recent Labs Lab 12/08/15 0117  WBC 6.3  HGB 7.5*  HCT 22.4*  PLT 252   ------------------------------------------------------------------------------------------------------------------  Chemistries   Recent Labs Lab 12/08/15 0117  NA 137  K 5.3*  CL 96*  CO2 27  GLUCOSE 95  BUN 63*  CREATININE 10.80*  CALCIUM 9.2  AST 22  ALT 15  ALKPHOS 102  BILITOT 0.7   ------------------------------------------------------------------------------------------------------------------  Cardiac Enzymes  Recent Labs Lab 12/08/15 0725  TROPONINI 0.07*   ------------------------------------------------------------------------------------------------------------------  RADIOLOGY:  Dg Chest Port 1 View  Result Date: 12/08/2015 CLINICAL DATA:  Shortness of breath. EXAM: PORTABLE CHEST 1 VIEW COMPARISON:  October 07, 2015 FINDINGS: Stable cardiomegaly. The left-sided pleural effusion and underlying opacity seen on the previous study remains but is slightly smaller in the interval. Pulmonary venous congestion without overt edema. No other acute abnormalities. IMPRESSION: Persistent left effusion and underlying infiltrate, persistent but improved in the interval. Mild  pulmonary venous congestion and stable cardiomegaly. Electronically Signed   By: Dorise Bullion III M.D   On: 12/08/2015  01:41    EKG:   Orders placed or performed during the hospital encounter of 12/08/15  . ED EKG  . ED EKG    ASSESSMENT AND PLAN:   60 y/o F with PMH of anemia, ESRD on TThS dialysis, COPD, combined CHF at EF of 45%, hepatitis C presents to the hospital secondary to worsening shortness of breath.  #1 acute on chronic systolic CHF exacerbation-missed her dialysis. - On Lasix in the hospital orally. Oliguric due to her renal disease. -Continue oxygen support. -F Roger consulted for dialysis in the hospital. -Patient refusing telemetry box, we'll transfer to med-surg floor  #2 end-stage renal disease on hemodialysis-on Tuesday Thursday Saturday schedule. -Missed dialysis yesterday, nephrology consulted. Possible dialysis today or tomorrow  #3 hypertension-on Lasix, Norvasc, metoprolol and Imdur. -IV hydralazine as needed. Patient is noncompliant and sometimes refusing medications  #4 COPD-stable. Continued dulera and Spiriva. On nebs when necessary  #5 GERD-on PPI  #6 Anemia of chronic disease- hb at 7.5, baseline around 8 May be epo with dialysis. No acute indication for Tx unless hb <7  #7 DVT prophylaxis-and subcutaneous heparin   Ambulates with walker at home. Possible discharge in the next 1-2 days    All the records are reviewed and case discussed with Care Management/Social Workerr. Management plans discussed with the patient, family and they are in agreement.  CODE STATUS: Full Code  TOTAL TIME TAKING CARE OF THIS PATIENT: 37 minutes.   POSSIBLE D/C IN 1-2 DAYS, DEPENDING ON CLINICAL CONDITION.   Gladstone Lighter M.D on 12/08/2015 at 9:55 AM  Between 7am to 6pm - Pager - [phone]  After 6pm go to [email or web address] - password [email or web address] [facility]  Office  [phone]  CC: Primary care physician; No PCP Per  Patient

## 2015-12-08 NOTE — Progress Notes (Signed)
Post hd vitals 

## 2015-12-08 NOTE — ED Triage Notes (Signed)
Patient presents to Emergency Department via EMS with complaints of SOB worse with exertion, dialysis pt, last dialysis Thursday, per pt fistula had become inoperative and will be replaced "after the holidays".  Pt DID NOT attend dialysis today.  Pt 2 lpm [place] at home continuously.  Pt is deaf but reads lips. Pt unable to answer many questions without getting SOB.

## 2015-12-08 NOTE — Progress Notes (Signed)
Initial Nutrition Assessment  DOCUMENTATION CODES:   Not applicable  INTERVENTION:  1. Ordered dinner for patient - she complains of inability to order food she likes. 2. Declined ONS  NUTRITION DIAGNOSIS:   Inadequate oral intake related to poor appetite as evidenced by per patient/family report  GOAL:   Patient will meet greater than or equal to 90% of their needs  MONITOR:   PO intake, Skin, I & O's, Labs, Weight trends  REASON FOR ASSESSMENT:   Consult, Malnutrition Screening Tool Assessment of nutrition requirement/status  ASSESSMENT:   Kelsey Peterson  is a 60 y.o. female with a known history of Anemia of chronic disease, end-stage renal disease on dialysis, coronary artery disease, systolic heart failure with EF of 45% COPD, GERD, hepatitis C presented to the emergency room with increased shortness of breath since last 1 day.  Spoke with Ms. Jubb at bedside - patient is well known to RD. She is hard of hearing - reads lips now. Generally agitated. Complains of poor appetite - unable to assess diet PTA, did not have breakfast this morning. She did not have breakfast this morning, had not yet ordered lunch during time of visit.  Continues to exhibit weight fluctuations  Per chart she exhibits a 21#/12% insignificant wt loss over 8 months. Unsure how much is related to fluctuations - patient unable to report dry weight.  Nutrition-Focused physical exam completed. Findings are no fat depletion, no muscle depletion, and mild edema.   Labs and medications reviewed: K 5.3 Phoslo, Rena-Vit, EPO,  Diet Order:  Diet renal with fluid restriction Fluid restriction: 1200 mL Fluid; Room service appropriate? Yes; Fluid consistency: Thin  Skin:  Reviewed, no issues  Last BM:  12/07/2015  Height:   Ht Readings from Last 1 Encounters:  12/08/15 5\' 2"  (1.575 m)    Weight:   Wt Readings from Last 1 Encounters:  12/08/15 161 lb 11.2 oz (73.3 kg)    Ideal Body  Weight:  50 kg  BMI:  Body mass index is 29.58 kg/m.  Estimated Nutritional Needs:   Kcal:  1600-2000 calories  Protein:  73-88 gm  Fluid:  >/= 1.6L  EDUCATION NEEDS:   No education needs identified at this time  Kelsey Peterson. Kelsey Fitzsimmons, MS, RD LDN Inpatient Clinical Dietitian Pager [phone]

## 2015-12-08 NOTE — ED Notes (Signed)
Call to Chrissie Noa, pt's son, [phone], message left

## 2015-12-08 NOTE — Progress Notes (Signed)
Patient refusing to take her medications, patient made aware of her BP being high,after several attempts, patient agreed to take BP medicine and Dulera. Report has been  given to [facility] nurse. Will transfer pt to [facility].

## 2015-12-08 NOTE — Progress Notes (Signed)
Pt ended HD tx 1 hr 40 minutes early d/t pain in access arm. Pt. Stated "this happens every treatment and a new access is supposed to be placed." No c/o of sob, vss, report to primary RN. Kolluru MD aware.

## 2015-12-08 NOTE — Progress Notes (Signed)
Pre hd info 

## 2015-12-08 NOTE — ED Notes (Signed)
CRITICAL LAB: Troponin is 0.07, [employer], Dr. Karma Greaser notified, orders recieved

## 2015-12-08 NOTE — ED Provider Notes (Signed)
[facility] Emergency Department Provider Note  ____________________________________________   First MD Initiated Contact with Patient 12/08/15 0150     (approximate)  I have reviewed the triage vital signs and the nursing notes.   HISTORY  Chief Complaint Shortness of Breath  Patient is deaf but reads lips and does communicate verbally  HPI Kelsey Peterson is a 60 y.o. female with extensive chronic medical conditions including end-stage renal disease on dialysis, COPD, and CHF.  She presents by EMS for shortness of breath.  Reportedly the patient missed dialysis today (she is on a Tuesday, Thursday, Saturday schedule) she is not feeling well and because she was told that her right upper extremity fistula is not working properly.  She reports that she was told that it will be replaced "after the holidays".  She states that she was feeling short of breath earlier today which is why she did not go to dialysisand that her shortness of breath has gotten worse.  She is on liters of oxygen at baseline but reportedly by EMS she was hypoxemic in spite of the oxygen however she has had an SPO2 in the upper 90s to 100% since coming to the emergency department.  She does have increased work of breathing and any amount of exertion makes it significantly worse.  She denies chest pain, abdominal pain, nausea/vomiting.  She reportedly does make urine although not every day   Past Medical History:  Diagnosis Date  . Anemia of chronic disease   . CAD (coronary artery disease)   . Chronic systolic CHF (congestive heart failure) (HCC)    a. EF 45% by echo in 06/2014  . Cognitive impairment   . COPD (chronic obstructive pulmonary disease) ([place]) 07/14/2014  . ESRD on hemodialysis ([place])   . GERD (gastroesophageal reflux disease) 07/14/2014  . Hepatitis C   . HOH (hard of hearing)   . HTN (hypertension)   . Migraine with aura   . Mitral regurgitation    a. calcified mitral  annulus, mod MR by echo in 06/2014  . Paroxysmal SVT (supraventricular tachycardia) ([place])    a. history of this, with last known occurrence in 06/2014  . Secondary hyperparathyroidism of renal origin [facility])     Patient Active Problem List   Diagnosis Date Noted  . CHF (congestive heart failure) ([place]) 10/07/2015  . Hypoglycemia 04/18/2015  . Elevated troponin 04/18/2015  . ESRD on hemodialysis ([place]) 04/18/2015  . Uncontrolled hypertension 04/18/2015  . Acute respiratory failure with hypoxia ([place]) 04/02/2015  . Pleural effusion, left 04/02/2015  . Extreme hearing loss 03/26/2015  . Acute on chronic systolic heart failure ([place]) 02/28/2015  . Cholelithiasis 12/01/2014  . Hyperkalemia 09/01/2014  . Fluid overload 09/01/2014  . GERD (gastroesophageal reflux disease) 07/14/2014  . COPD (chronic obstructive pulmonary disease) ([place]) 07/14/2014  . HTN (hypertension) 07/14/2014  . CAD (coronary artery disease) 07/14/2014  . End stage renal disease on dialysis ([place])   . Supraventricular tachycardia ([place])   . NSVT (nonsustained ventricular tachycardia) ([place])   . Anemia 07/03/2014    Past Surgical History:  Procedure Laterality Date  . COLONOSCOPY N/A 07/15/2014   Procedure: COLONOSCOPY;  Surgeon: Manya Silvas, MD;  Location: [facility] ENDOSCOPY;  Service: Endoscopy;  Laterality: N/A;  . COLONOSCOPY WITH PROPOFOL Left 07/09/2014   Procedure: COLONOSCOPY WITH PROPOFOL;  Surgeon: Hulen Luster, MD;  Location[facility] ENDOSCOPY;  Service: Endoscopy;  Laterality: Left;  . DIALYSIS FISTULA CREATION    . ESOPHAGOGASTRODUODENOSCOPY N/A 04/22/2015  Procedure: ESOPHAGOGASTRODUODENOSCOPY (EGD);  Surgeon: Manya Silvas, MD;  Location[facility] ENDOSCOPY;  Service: Endoscopy;  Laterality: N/A;  . PERIPHERAL VASCULAR CATHETERIZATION N/A 10/08/2015   Procedure: A/V Shuntogram/Fistulagram;  Surgeon: Katha Cabal, MD;  Location: [facility] CV LAB;  Service: Cardiovascular;  Laterality: N/A;  . TUBAL LIGATION        Prior to Admission medications   Medication Sig Start Date End Date Taking? Authorizing Provider  albuterol (PROVENTIL HFA;VENTOLIN HFA) 108 (90 Base) MCG/ACT inhaler Inhale 2 puffs into the lungs every 6 (six) hours as needed for wheezing or shortness of breath. 04/04/15   Hillary Bow, MD  amiodarone (PACERONE) 200 MG tablet Take 1 tablet (200 mg total) by mouth daily. 04/04/15   Srikar Sudini, MD  amLODipine (NORVASC) 5 MG tablet Take 5 mg by mouth daily.    Historical Provider, MD  calcium acetate (PHOSLO) 667 MG capsule Take [phone] capsules by mouth 5 (five) times daily. 1334 mg 3 times daily with meals and 667 mg 2 times daily with snacks    Historical Provider, MD  cinacalcet (SENSIPAR) 60 MG tablet Take 60 mg by mouth daily.    Historical Provider, MD  clopidogrel (PLAVIX) 75 MG tablet Take 75 mg by mouth daily. 08/31/14   Historical Provider, MD  Fluticasone-Salmeterol (ADVAIR DISKUS) 250-50 MCG/DOSE AEPB Inhale 1 puff into the lungs 2 (two) times daily. 04/04/15   Hillary Bow, MD  folic acid-vitamin b complex-vitamin c-selenium-zinc (DIALYVITE) 3 MG TABS tablet Take 1 tablet by mouth daily.    Historical Provider, MD  furosemide (LASIX) 80 MG tablet Take 80 mg by mouth every Tuesday, Thursday, Saturday, and Sunday.     Historical Provider, MD  ipratropium-albuterol (DUONEB) 0.5-2.5 (3) MG/3ML SOLN Take 3 mLs by nebulization every 4 (four) hours as needed. Patient taking differently: Take 3 mLs by nebulization every 4 (four) hours as needed. For wheezing/shortness of breath 04/23/15   Vaughan Basta, MD  isosorbide mononitrate (IMDUR) 30 MG 24 hr tablet Take 30 mg by mouth daily.    Historical Provider, MD  metoprolol tartrate (LOPRESSOR) 25 MG tablet Take 12.5 mg by mouth 2 (two) times daily. 10/21/14   Historical Provider, MD  ondansetron (ZOFRAN) 4 MG tablet Take 4 mg by mouth every 8 (eight) hours as needed for nausea or vomiting. Reported on 02/05/2015    Historical Provider,  MD  pantoprazole (PROTONIX) 40 MG tablet Take 1 tablet (40 mg total) by mouth 2 (two) times daily. 04/23/15   Vaughan Basta, MD  predniSONE (DELTASONE) 5 MG tablet Take one tab po daily for two days then 1/2 tab po daily for two days 10/10/15   Loletha Grayer, MD  tiotropium (SPIRIVA) 18 MCG inhalation capsule Place 18 mcg into inhaler and inhale daily.    Historical Provider, MD    Allergies Contrast media [iodinated diagnostic agents]; Morphine and related; and Other  Family History  Problem Relation Age of Onset  . Heart disease Mother   . Hypertension Mother     Social History Social History  Substance Use Topics  . Smoking status: Never Smoker  . Smokeless tobacco: Never Used  . Alcohol use No    Review of Systems Constitutional: No fever/chills Eyes: No visual changes. ENT: No sore throat. Cardiovascular: Denies chest pain. Respiratory: +shortness of breath. Gastrointestinal: No abdominal pain.  No nausea, no vomiting.  No diarrhea.  No constipation. Genitourinary: Negative for dysuria. Musculoskeletal: Negative for back pain. Skin: Negative for rash. Neurological: Negative for headaches, focal  weakness or numbness.  10-point ROS otherwise negative.  ____________________________________________   PHYSICAL EXAM:  VITAL SIGNS: ED Triage Vitals  Enc Vitals Group     BP 12/08/15 0104 (!) 180/92     Pulse Rate 12/08/15 0104 81     Resp 12/08/15 0104 (!) 22     Temp 12/08/15 0104 97.4 F (36.3 C)     Temp Source 12/08/15 0104 Oral     SpO2 12/08/15 0059 (!) 85 %     Weight 12/08/15 0105 171 lb (77.6 kg)     Height 12/08/15 0105 5\' 2"  (1.575 m)     Head Circumference --      Peak Flow --      Pain Score --      Pain Loc --      Pain Edu? --      Excl. in [place]? --     Constitutional: Alert and oriented. Mild distress, worse with exertion. Eyes: Conjunctivae are normal. PERRL. EOMI. Head: Atraumatic. Nose: No congestion/rhinnorhea. Mouth/Throat:  Mucous membranes are moist.  Oropharynx non-erythematous. Neck: No stridor.  No meningeal signs.   Cardiovascular: Normal rate, regular rhythm. Good peripheral circulation. Grossly normal heart sounds. Respiratory: Mildly increased respiratory effort.  No retractions. Lungs CTAB with no wheezing Gastrointestinal: Soft and nontender. No distention.  Musculoskeletal: No gross deformities of extremities. Neurologic:  Normal speech and language for her (patient is legally deaf). No gross focal neurologic deficits are appreciated other than chronic hearing loss Skin:  Skin is warm, dry and intact. No rash noted. Psychiatric: Mood and affect are normal. Speech and behavior are normal.  ____________________________________________   LABS (all labs ordered are listed, but only abnormal results are displayed)  Labs Reviewed  COMPREHENSIVE METABOLIC PANEL - Abnormal; Notable for the following:       Result Value   Potassium 5.3 (*)    Chloride 96 (*)    BUN 63 (*)    Creatinine, Ser 10.80 (*)    GFR calc non Af Amer 3 (*)    GFR calc Af Amer 4 (*)    All other components within normal limits  BRAIN NATRIURETIC PEPTIDE - Abnormal; Notable for the following:    B Natriuretic Peptide 3,011.0 (*)    All other components within normal limits  TROPONIN I - Abnormal; Notable for the following:    Troponin I 0.07 (*)    All other components within normal limits  CBC WITH DIFFERENTIAL/PLATELET - Abnormal; Notable for the following:    RBC 2.87 (*)    Hemoglobin 7.5 (*)    HCT 22.4 (*)    MCV 77.9 (*)    RDW 22.3 (*)    Lymphs Abs 0.7 (*)    All other components within normal limits   ____________________________________________  EKG  ED ECG REPORT I, Briani Maul, the attending physician, personally viewed and interpreted this ECG.  Date: 12/08/2015 EKG Time: 01:23 Rate: 84 Rhythm: normal sinus rhythm QRS Axis: normal Intervals: normal ST/T Wave abnormalities: Non-specific ST  segment / T-wave changes, but no evidence of acute ischemia. Conduction Disturbances: none Narrative Interpretation: unremarkable  ____________________________________________  RADIOLOGY   Dg Chest Port 1 View  Result Date: 12/08/2015 CLINICAL DATA:  Shortness of breath. EXAM: PORTABLE CHEST 1 VIEW COMPARISON:  October 07, 2015 FINDINGS: Stable cardiomegaly. The left-sided pleural effusion and underlying opacity seen on the previous study remains but is slightly smaller in the interval. Pulmonary venous congestion without overt edema. No other acute abnormalities. IMPRESSION: Persistent  left effusion and underlying infiltrate, persistent but improved in the interval. Mild pulmonary venous congestion and stable cardiomegaly. Electronically Signed   By: Dorise Bullion III M.D   On: 12/08/2015 01:41    ____________________________________________   PROCEDURES  Procedure(s) performed:   Procedures   Critical Care performed: No ____________________________________________   INITIAL IMPRESSION / ASSESSMENT AND PLAN / ED COURSE  Pertinent labs & imaging results that were available during my care of the patient were reviewed by me and considered in my medical decision making (see chart for details).  Signs and symptoms are consistent with acute CHF exacerbation in the setting of no dialysis today.  She has a slightly elevated potassium but not dangerously so and she is being treated with albuterol nebulizers for her COPD.  However I do not think that she is having a COPD exacerbation based on good air movement and no wheezing.  She does have pulmonary edema on her chest x-ray and an elevated BNP of greater than 3000.  She produces some urine so gave her dose of Lasix 40 mg IV and admit her for further management.  Even though she reports that her fistula is not working well, it must be functional because she had dialysis on Thursday and had plans to go today and was told by vascular  surgery that she would not have corrective surgery until after next week, so she should be able to receive dialysis during this hospitalization.  I spoke by phone with the hospitalist who agrees with the plan and will admit.   ____________________________________________  FINAL CLINICAL IMPRESSION(S) / ED DIAGNOSES  Final diagnoses:  Acute on chronic combined systolic and diastolic congestive heart failure (HCC)  ESRD (end stage renal disease) on dialysis (HCC)  Chronic bronchitis, unspecified chronic bronchitis type ([place])  Shortness of breath  Demand ischemia ([place])     MEDICATIONS GIVEN DURING THIS VISIT:  Medications  furosemide (LASIX) injection 40 mg (not administered)  ipratropium-albuterol (DUONEB) 0.5-2.5 (3) MG/3ML nebulizer solution 3 mL (not administered)  ipratropium-albuterol (DUONEB) 0.5-2.5 (3) MG/3ML nebulizer solution 3 mL (3 mLs Nebulization Given 12/08/15 0158)     NEW OUTPATIENT MEDICATIONS STARTED DURING THIS VISIT:  New Prescriptions   No medications on file    Modified Medications   No medications on file    Discontinued Medications   No medications on file     Note:  This document was prepared using Dragon voice recognition software and may include unintentional dictation errors.    Hinda Kehr, MD 12/08/15 [phone]

## 2015-12-08 NOTE — Progress Notes (Signed)
RT gave pt PRN breathing tx. Pt wearing 2L [place] with SpO2 96% priot to tx. After tx pt upset with RT because RT would not increase flow meter to 8L with [place]. RT explained to pt that this was excessive for a [place] and pts SpO2 did not indicate an increase in O2. Pt request RN to room, RT notified RN of pts request to see her.

## 2015-12-09 LAB — CBC
HEMATOCRIT: 20 % — AB (ref 35.0–47.0)
HEMOGLOBIN: 6.8 g/dL — AB (ref 12.0–16.0)
MCH: 26.6 pg (ref 26.0–34.0)
MCHC: 34.2 g/dL (ref 32.0–36.0)
MCV: 78 fL — AB (ref 80.0–100.0)
Platelets: 246 10*3/uL (ref 150–440)
RBC: 2.56 MIL/uL — ABNORMAL LOW (ref 3.80–5.20)
RDW: 23 % — ABNORMAL HIGH (ref 11.5–14.5)
WBC: 6.5 10*3/uL (ref 3.6–11.0)

## 2015-12-09 LAB — PREPARE RBC (CROSSMATCH)

## 2015-12-09 LAB — BASIC METABOLIC PANEL
Anion gap: 12 (ref 5–15)
BUN: 48 mg/dL — AB (ref 6–20)
CHLORIDE: 97 mmol/L — AB (ref 101–111)
CO2: 27 mmol/L (ref 22–32)
CREATININE: 8.91 mg/dL — AB (ref 0.44–1.00)
Calcium: 9 mg/dL (ref 8.9–10.3)
GFR calc Af Amer: 5 mL/min — ABNORMAL LOW (ref >60)
GFR calc non Af Amer: 4 mL/min — ABNORMAL LOW (ref >60)
GLUCOSE: 85 mg/dL (ref 65–99)
Potassium: 5.8 mmol/L — ABNORMAL HIGH (ref 3.5–5.1)
Sodium: 136 mmol/L (ref 135–145)

## 2015-12-09 LAB — HEPATITIS B SURFACE ANTIBODY,QUALITATIVE: HEP B S AB: NONREACTIVE

## 2015-12-09 LAB — HEPATITIS B CORE ANTIBODY, TOTAL: Hep B Core Total Ab: NEGATIVE

## 2015-12-09 LAB — HEPATITIS B SURFACE ANTIGEN: Hepatitis B Surface Ag: NEGATIVE

## 2015-12-09 MED ORDER — CALCIUM ACETATE (PHOS BINDER) 667 MG PO CAPS
1334.0000 mg | ORAL_CAPSULE | Freq: Three times a day (TID) | ORAL | Status: DC
  Filled 2015-12-09: qty 2

## 2015-12-09 MED ORDER — ACETAMINOPHEN 325 MG PO TABS: 650.0000 mg | ORAL_TABLET | Freq: Once | ORAL | Status: DC

## 2015-12-09 MED ORDER — LIDOCAINE-PRILOCAINE 2.5-2.5 % EX CREA
1.0000 "application " | TOPICAL_CREAM | CUTANEOUS | Status: DC | PRN
  Filled 2015-12-09: qty 5

## 2015-12-09 MED ORDER — HEPARIN SODIUM (PORCINE) 1000 UNIT/ML DIALYSIS
1000.0000 [IU] | INTRAMUSCULAR | Status: DC | PRN
  Filled 2015-12-09: qty 1

## 2015-12-09 MED ORDER — SODIUM CHLORIDE 0.9 % IV SOLN: Freq: Once | INTRAVENOUS | Status: DC

## 2015-12-09 MED ORDER — CALCIUM ACETATE (PHOS BINDER) 667 MG PO CAPS
667.0000 mg | ORAL_CAPSULE | ORAL | Status: DC
  Administered 2015-12-09: 667 mg via ORAL
  Filled 2015-12-09: qty 1

## 2015-12-09 MED ORDER — DIPHENHYDRAMINE HCL 25 MG PO CAPS
25.0000 mg | ORAL_CAPSULE | Freq: Every evening | ORAL | Status: DC | PRN
  Administered 2015-12-09: 25 mg via ORAL
  Filled 2015-12-09: qty 1

## 2015-12-09 NOTE — Progress Notes (Signed)
This note also relates to the following rows which could not be included: Rate - Cannot attach notes to extension rows Line - Cannot attach notes to extension rows  START OF BLOOD TRANSFUSION

## 2015-12-09 NOTE — Progress Notes (Signed)
  End of hd 

## 2015-12-09 NOTE — Progress Notes (Signed)
Pt restless and agitated, vss for baseline, 1uprbc transfusing. Pt. Continues to move/bend access arm. Requested she try to keep it immobile during HD to ensure she receives blood transfusion. Pt. Does not respond to this request.

## 2015-12-09 NOTE — Progress Notes (Signed)
Pt ended HD tx post blood transfusion 1.5 hours early AMA d/t being uncomfortable in bed. Candiss Norse, MD aware. Report to T. Cranford primary RN. BP high, other vss. No s/s of transfusion reaction.

## 2015-12-09 NOTE — Progress Notes (Signed)
Post hd assessment 

## 2015-12-09 NOTE — Progress Notes (Signed)
Pt being uncooperative with HD tx today. Hanging lower body out of bed and repeatedly moving/bending access arm. Explained need to stay in bed and reasoning as well as not moving access arm. Pt. Said she cannot get comfortable. No c/o of pain and no s/s of sob.

## 2015-12-09 NOTE — Progress Notes (Signed)
PT Cancellation Note  Patient Details Name: Kelsey Peterson MRN: [id number] DOB: 09/21/1955   Cancelled Treatment:    Reason Eval/Treat Not Completed: Medical issues which prohibited therapy.  Pt's potassium noted to be 5.8 today.  Per PT guidelines for elevated potassium, will hold PT at this time at re-attempt PT eval at a later date/time as medically appropriate.   [name] [name] 12/09/2015, 10:08 AM Leitha Bleak, [place]

## 2015-12-09 NOTE — Progress Notes (Signed)
[facility]  ROUNDING NOTE   Subjective:   Kelsey Peterson presents on 12/08/2015 for Shortness of breath [R06.02] Demand ischemia ([place]) [I24.8] ESRD (end stage renal disease) on dialysis ([place]) [N18.6, Z99.2] Acute on chronic combined systolic and diastolic congestive heart failure (HCC) [I50.43] Chronic bronchitis, unspecified chronic bronchitis type ([place]) [J42]  Potassium still high Hgb low Patient cut her treatment short yesterday due to pain around access   Objective:  Vital signs in last 24 hours:  Temp:  [97.7 F (36.5 C)-98 F (36.7 C)] 97.7 F (36.5 C) (11/20 1445) Pulse Rate:  [70-79] 70 (11/20 1600) Resp:  [16-23] 23 (11/20 1600) BP: (152-177)/(80-104) 177/99 (11/20 1600) SpO2:  [96 %-100 %] 100 % (11/20 1600) Weight:  [77.8 kg (171 lb 8.3 oz)] 77.8 kg (171 lb 8.3 oz) (11/20 1445)  Weight change: -3.165 kg (-6 lb 15.6 oz) Filed Weights   12/08/15 0449 12/08/15 1320 12/09/15 1445  Weight: 73.3 kg (161 lb 11.2 oz) 74.4 kg (164 lb 0.4 oz) 77.8 kg (171 lb 8.3 oz)    Intake/Output: I/O last 3 completed shifts: In: 0  Out: 1168 [Urine:50; [id number]   Intake/Output this shift:  No intake/output data recorded.  Physical Exam: General: NAD,   Head: Decreased hearing   Eyes: Anicteric,    Neck: Supple, trachea midline  Lungs:  Clear to auscultation  Heart: Regular rate and rhythm  Abdomen:  Soft, nontender,   Extremities: no peripheral edema.  Neurologic: Nonfocal, moving all four extremities  Skin: No lesions  Access:  arm AVG    Basic Metabolic Panel:  Recent Labs Lab 12/08/15 0117 12/08/15 1325 12/09/15 0433  NA 137 136 136  K 5.3* 5.8* 5.8*  CL 96* 94* 97*  CO2 27 26 27   GLUCOSE 95 89 85  BUN 63* 67* 48*  CREATININE 10.80* 11.33* 8.91*  CALCIUM 9.2 9.3 9.0  PHOS  --  11.9*  --     Liver Function Tests:  Recent Labs Lab 12/08/15 0117 12/08/15 1325  AST 22  --   ALT 15  --   ALKPHOS 102  --   BILITOT 0.7  --    PROT 7.8  --   ALBUMIN 3.7 3.5   No results for input(s): LIPASE, AMYLASE in the last 168 hours. No results for input(s): AMMONIA in the last 168 hours.  CBC:  Recent Labs Lab 12/08/15 0117 12/08/15 1325 12/09/15 0433  WBC 6.3 6.4 6.5  NEUTROABS 4.7  --   --   HGB 7.5* 6.8* 6.8*  HCT 22.4* 19.7* 20.0*  MCV 77.9* 78.4* 78.0*  PLT 252 246 246    Cardiac Enzymes:  Recent Labs Lab 12/08/15 0117 12/08/15 0725 12/08/15 1325 12/08/15 1926  TROPONINI 0.07* 0.07* 0.07* 0.07*    BNP: Invalid input(s): POCBNP  CBG:  Recent Labs Lab 12/08/15 0502  GLUCAP 110*    Microbiology: Results for orders placed or performed during the hospital encounter of 12/08/15  MRSA PCR Screening     Status: Abnormal   Collection Time: 12/08/15  4:52 AM  Result Value Ref Range Status   MRSA by PCR POSITIVE (A) NEGATIVE Final    Comment:        The GeneXpert MRSA Assay (FDA approved for NASAL specimens only), is one component of a comprehensive MRSA colonization surveillance program. It is not intended to diagnose MRSA infection nor to guide or monitor treatment for MRSA infections. RESULT CALLED TO, READ BACK BY AND VERIFIED WITH: MARCEL TURNER AT  [id number] ON 12/08/15 RWW     Coagulation Studies: No results for input(s): LABPROT, INR in the last 72 hours.  Urinalysis: No results for input(s): COLORURINE, LABSPEC, PHURINE, GLUCOSEU, HGBUR, BILIRUBINUR, KETONESUR, PROTEINUR, UROBILINOGEN, NITRITE, LEUKOCYTESUR in the last 72 hours.  Invalid input(s): APPERANCEUR    Imaging: Dg Chest Port 1 View  Result Date: 12/08/2015 CLINICAL DATA:  Shortness of breath. EXAM: PORTABLE CHEST 1 VIEW COMPARISON:  October 07, 2015 FINDINGS: Stable cardiomegaly. The left-sided pleural effusion and underlying opacity seen on the previous study remains but is slightly smaller in the interval. Pulmonary venous congestion without overt edema. No other acute abnormalities. IMPRESSION: Persistent left  effusion and underlying infiltrate, persistent but improved in the interval. Mild pulmonary venous congestion and stable cardiomegaly. Electronically Signed   By: Dorise Bullion III M.D   On: 12/08/2015 01:41     Medications:    . sodium chloride   Intravenous Once  . acetaminophen  650 mg Oral Once  . amiodarone  200 mg Oral Daily  . amLODipine  5 mg Oral Daily  . calcium acetate  1,334 mg Oral TID WC  . calcium acetate  667 mg Oral With snacks  . Chlorhexidine Gluconate Cloth  6 each Topical Q0600  . cinacalcet  60 mg Oral Daily  . clopidogrel  75 mg Oral Daily  . fluticasone  1 spray Each Nare Daily  . furosemide  80 mg Oral Q T,Th,S,Su  . heparin  5,000 Units Subcutaneous Q8H  . isosorbide mononitrate  30 mg Oral Daily  . metoprolol tartrate  12.5 mg Oral BID  . mometasone-formoterol  2 puff Inhalation BID  . multivitamin  1 tablet Oral QHS  . mupirocin ointment  1 application Nasal BID  . pantoprazole  40 mg Oral BID  . sodium chloride flush  3 mL Intravenous Q12H  . sodium chloride flush  3 mL Intravenous Q12H  . tiotropium  18 mcg Inhalation Daily   sodium chloride, acetaminophen **OR** acetaminophen, albuterol, heparin, hydrALAZINE, ipratropium-albuterol, lidocaine-prilocaine, ondansetron **OR** ondansetron (ZOFRAN) IV, senna-docusate, sodium chloride flush  Assessment/ Plan:  Kelsey Peterson is a 60 y.o. black female with hypertension, anemia of chronic kidney disease, ESRD on HD TTHS, secondary hyperparathyroidism, hx of meningitis as a child with resultant hearing loss,  TTS [facility] [place]   1. End Stage Renal Disease:  - extra HD treatment today due to Hyperkalemia  2. Hypertension: elevated.  - restart home regimen of amlodipine, furosemide, metoprolol, imdur  3. Anemia of chronic kidney disease: unable to take IV iron  - epo with treatment - Blood transfusion to be given during HD today  4. Secondary Hyperparathyroidism: with  hyperphosphatemia. Outpatient PTH 1558, phosphorus 11.9,  - restarted cinacalcet and calcium acetate.     LOS: 1 Taylore Hinde 11/20/20174:03 PM

## 2015-12-09 NOTE — Progress Notes (Signed)
Post hd vitals 

## 2015-12-09 NOTE — Progress Notes (Signed)
1uprbc transfusion ended

## 2015-12-09 NOTE — Progress Notes (Signed)
Pt refusing CHG bath this am and demanding I give her full pitcher of ice, re instructed care and she still refused.

## 2015-12-09 NOTE — Progress Notes (Signed)
Pre hd 

## 2015-12-09 NOTE — Progress Notes (Signed)
[facility] [place] NAME: Kelsey Peterson    MR#:  [id number]  DATE OF BIRTH:  October 18, 1955  SUBJECTIVE:  CHIEF COMPLAINT:   Chief Complaint  Patient presents with  . Shortness of Breath   - Dialysis patient, Missed her dialysis yesterday due to holiday schedule -Came in with shortness of breath and noted to have pulmonary edema -Multiple admissions for the same. Very poor historian. Refusing medications, telemetry monitoring.  No complaint, on O2 [place] 2L. REVIEW OF SYSTEMS:  Review of Systems  Constitutional: Positive for malaise/fatigue. Negative for chills and fever.  HENT: Negative for congestion, ear discharge, hearing loss and nosebleeds.   Eyes: Negative for blurred vision.  Respiratory: Negative for cough, shortness of breath and wheezing.   Cardiovascular: Negative for chest pain, palpitations and leg swelling.  Gastrointestinal: Negative for abdominal pain, constipation, diarrhea, nausea and vomiting.  Genitourinary: Negative for dysuria.  Musculoskeletal: Negative for myalgias.  Neurological: Negative for dizziness, speech change, focal weakness, seizures and headaches.  Psychiatric/Behavioral: Negative for depression.    DRUG ALLERGIES:   Allergies  Allergen Reactions  . Contrast Media [Iodinated Diagnostic Agents] Other (See Comments)    Unknown reaction  . Morphine And Related Hives  . Other Rash    Blood Pressure Pill (Pt doesn't remember name)     VITALS:  Blood pressure (!) 152/80, pulse 76, temperature 98 F (36.7 C), temperature source Oral, resp. rate 16, height 5\' 2"  (1.575 m), weight 164 lb 0.4 oz (74.4 kg), SpO2 100 %.  PHYSICAL EXAMINATION:  Physical Exam  GENERAL:  60 y.o.-year-old patient lying in the bed with no acute distress.  EYES: Pupils equal, round, reactive to light and accommodation. No scleral icterus. Extraocular muscles intact.  HEENT: Head atraumatic, normocephalic. Oropharynx and  nasopharynx clear.  NECK:  Supple, no jugular venous distention. No thyroid enlargement, no tenderness.  LUNGS: Normal breath sounds bilaterally, no wheezing, rales,rhonchi or crepitation. No use of accessory muscles of respiration. Mild Bibasilar crackles CARDIOVASCULAR: S1, S2 normal. No  rubs, or gallops. 2/6 systolic murmur ABDOMEN: Soft, nontender, nondistended. Bowel sounds present. No organomegaly or mass.  EXTREMITIES: No pedal edema, cyanosis, or clubbing.  NEUROLOGIC: Cranial nerves II through XII are intact. Muscle strength 5/5 in all extremities. Sensation intact. Gait not checked. Global weakness PSYCHIATRIC: The patient is alert and oriented x 3.  SKIN: No obvious rash, lesion, or ulcer.    LABORATORY PANEL:   CBC  Recent Labs Lab 12/09/15 0433  WBC 6.5  HGB 6.8*  HCT 20.0*  PLT 246   ------------------------------------------------------------------------------------------------------------------  Chemistries   Recent Labs Lab 12/08/15 0117  12/09/15 0433  NA 137  < > 136  K 5.3*  < > 5.8*  CL 96*  < > 97*  CO2 27  < > 27  GLUCOSE 95  < > 85  BUN 63*  < > 48*  CREATININE 10.80*  < > 8.91*  CALCIUM 9.2  < > 9.0  AST 22  --   --   ALT 15  --   --   ALKPHOS 102  --   --   BILITOT 0.7  --   --   < > = values in this interval not displayed. ------------------------------------------------------------------------------------------------------------------  Cardiac Enzymes  Recent Labs Lab 12/08/15 1926  TROPONINI 0.07*   ------------------------------------------------------------------------------------------------------------------  RADIOLOGY:  Dg Chest Port 1 View  Result Date: 12/08/2015 CLINICAL DATA:  Shortness of breath. EXAM: PORTABLE CHEST 1 VIEW COMPARISON:  October 07, 2015 FINDINGS: Stable cardiomegaly. The left-sided pleural effusion and underlying opacity seen on the previous study remains but is slightly smaller in the interval.  Pulmonary venous congestion without overt edema. No other acute abnormalities. IMPRESSION: Persistent left effusion and underlying infiltrate, persistent but improved in the interval. Mild pulmonary venous congestion and stable cardiomegaly. Electronically Signed   By: Dorise Bullion III M.D   On: 12/08/2015 01:41    EKG:   Orders placed or performed during the hospital encounter of 12/08/15  . ED EKG  . ED EKG    ASSESSMENT AND PLAN:   60 y/o F with PMH of anemia, ESRD on TThS dialysis, COPD, combined CHF at EF of 45%, hepatitis C presents to the hospital secondary to worsening shortness of breath.  #1 acute on chronic systolic CHF exacerbation-missed her dialysis. - On Lasix in the hospital orally. Oliguric due to her renal disease. -Continue oxygen support. -Patient refusing telemetry box. She got HD yesterday.  #2 end-stage renal disease on hemodialysis-on Tuesday Thursday Saturday schedule. Need dialysis today.  * Hyperkalemia. HD today.  #3 hypertension-on Lasix, Norvasc, metoprolol and Imdur. -IV hydralazine as needed. Patient is noncompliant and sometimes refusing medications  #4 COPD-stable. Continued dulera and Spiriva. On nebs when necessary  #5 GERD-on PPI  #6  Symptomatic Anemia of chronic disease- hb at 7.5, baseline, down to 6.8. 1 unit PRBC transfusion during HD today.  #7 DVT prophylaxis-and subcutaneous heparin   Ambulates with walker at home. Possible discharge in the next 1-2 days  All the records are reviewed and case discussed with Care Management/Social Workerr. Management plans discussed with the patient, family and they are in agreement.  CODE STATUS: Full Code  TOTAL TIME TAKING CARE OF THIS PATIENT: 46 minutes.   POSSIBLE D/C IN 2 DAYS, DEPENDING ON CLINICAL CONDITION.   Demetrios Loll M.D on 12/09/2015 at 1:28 PM  Between 7am to 6pm - Pager - [phone]  After 6pm go to [email or web address] - password [email or web address] [facility]    Office  [phone]  CC: Primary care physician; No PCP Per Patient

## 2015-12-09 NOTE — Progress Notes (Signed)
Pre hd assessment  

## 2015-12-09 NOTE — Progress Notes (Signed)
Start of hd 

## 2015-12-09 NOTE — Care Management (Signed)
Confirmed with MD Office Lavera Guise that patient is not open for PCP services.  Their office sees her for pulmonology, however she was declined for PCP service due to no shows and medication non compliance.

## 2015-12-10 LAB — CBC
HEMATOCRIT: 22.9 % — AB (ref 35.0–47.0)
Hemoglobin: 7.5 g/dL — ABNORMAL LOW (ref 12.0–16.0)
MCH: 26.2 pg (ref 26.0–34.0)
MCHC: 32.9 g/dL (ref 32.0–36.0)
MCV: 79.7 fL — AB (ref 80.0–100.0)
Platelets: 245 10*3/uL (ref 150–440)
RBC: 2.87 MIL/uL — ABNORMAL LOW (ref 3.80–5.20)
RDW: 22.1 % — AB (ref 11.5–14.5)
WBC: 7.7 10*3/uL (ref 3.6–11.0)

## 2015-12-10 LAB — BASIC METABOLIC PANEL
Anion gap: 9 (ref 5–15)
BUN: 39 mg/dL — AB (ref 6–20)
CALCIUM: 8.5 mg/dL — AB (ref 8.9–10.3)
CHLORIDE: 98 mmol/L — AB (ref 101–111)
CO2: 29 mmol/L (ref 22–32)
CREATININE: 7.54 mg/dL — AB (ref 0.44–1.00)
GFR calc non Af Amer: 5 mL/min — ABNORMAL LOW (ref >60)
GFR, EST AFRICAN AMERICAN: 6 mL/min — AB (ref >60)
GLUCOSE: 93 mg/dL (ref 65–99)
Potassium: 4.7 mmol/L (ref 3.5–5.1)
Sodium: 136 mmol/L (ref 135–145)

## 2015-12-10 LAB — TYPE AND SCREEN
ABO/RH(D): O NEG
Antibody Screen: NEGATIVE
UNIT DIVISION: 0

## 2015-12-10 NOTE — Progress Notes (Signed)
Dialysis initiated without issue. Pt has flat affect. Currently cooperative. Discussed the importance of keeping access arm straight and uncovered during HD for safety.

## 2015-12-10 NOTE — Progress Notes (Signed)
[facility]  ROUNDING NOTE   Subjective:   Kelsey Peterson presents on 12/08/2015 for Shortness of breath [R06.02] Demand ischemia ([place]) [I24.8] ESRD (end stage renal disease) on dialysis ([place]) [N18.6, Z99.2] Acute on chronic combined systolic and diastolic congestive heart failure (HCC) [I50.43] Chronic bronchitis, unspecified chronic bronchitis type ([place]) [J42]  Potassium still high Blood transfusion given with dialysis yesterday.  However, patient cut her treatment short Regular HD day today Patient seen during dialysis Tolerating well    HEMODIALYSIS FLOWSHEET:  Blood Flow Rate (mL/min): 400 mL/min Arterial Pressure (mmHg): -100 mmHg Venous Pressure (mmHg): 210 mmHg Transmembrane Pressure (mmHg): 60 mmHg Ultrafiltration Rate (mL/min): 680 mL/min Dialysate Flow Rate (mL/min): 800 ml/min Conductivity: Machine : 14.1 Conductivity: Machine : 14.1 Dialysis Fluid Bolus: Normal Saline Bolus Amount (mL): 250 mL Dialysate Change: 2K Intra-Hemodialysis Comments: HD completed. Pt tolerated well.      Objective:  Vital signs in last 24 hours:  Temp:  [97.6 F (36.4 C)-98.9 F (37.2 C)] 98.3 F (36.8 C) (11/21 1340) Pulse Rate:  [67-77] 74 (11/21 1340) Resp:  [18-24] 24 (11/21 1340) BP: (154-196)/(75-106) 183/91 (11/21 1340) SpO2:  [97 %-100 %] 98 % (11/21 1340) Weight:  [73 kg (160 lb 15 oz)-77.9 kg (171 lb 11.8 oz)] 73 kg (160 lb 15 oz) (11/21 1244)  Weight change: 3.4 kg (7 lb 7.9 oz) Filed Weights   12/09/15 1655 12/10/15 0938 12/10/15 1244  Weight: 77.9 kg (171 lb 11.8 oz) 74.5 kg (164 lb 3.9 oz) 73 kg (160 lb 15 oz)    Intake/Output: I/O last 3 completed shifts: In: 496 [P.O.:180; I.V.:6; Blood:310] Out: 90 [Other:90]   Intake/Output this shift:  Total I/O In: -  Out: 1400 [Other:1400]  Physical Exam: General: NAD,   Head: Decreased hearing   Eyes: Anicteric,    Neck: Supple, trachea midline  Lungs:  Clear to auscultation  Heart:  Regular rate and rhythm  Abdomen:  Soft, nontender,   Extremities: no peripheral edema.  Neurologic: Nonfocal, moving all four extremities  Skin: No lesions  Access:  arm AVG    Basic Metabolic Panel:  Recent Labs Lab 12/08/15 0117 12/08/15 1325 12/09/15 0433 12/10/15 0513  NA 137 136 136 136  K 5.3* 5.8* 5.8* 4.7  CL 96* 94* 97* 98*  CO2 27 26 27 29   GLUCOSE 95 89 85 93  BUN 63* 67* 48* 39*  CREATININE 10.80* 11.33* 8.91* 7.54*  CALCIUM 9.2 9.3 9.0 8.5*  PHOS  --  11.9*  --   --     Liver Function Tests:  Recent Labs Lab 12/08/15 0117 12/08/15 1325  AST 22  --   ALT 15  --   ALKPHOS 102  --   BILITOT 0.7  --   PROT 7.8  --   ALBUMIN 3.7 3.5   No results for input(s): LIPASE, AMYLASE in the last 168 hours. No results for input(s): AMMONIA in the last 168 hours.  CBC:  Recent Labs Lab 12/08/15 0117 12/08/15 1325 12/09/15 0433 12/10/15 0513  WBC 6.3 6.4 6.5 7.7  NEUTROABS 4.7  --   --   --   HGB 7.5* 6.8* 6.8* 7.5*  HCT 22.4* 19.7* 20.0* 22.9*  MCV 77.9* 78.4* 78.0* 79.7*  PLT 252 246 246 245    Cardiac Enzymes:  Recent Labs Lab 12/08/15 0117 12/08/15 0725 12/08/15 1325 12/08/15 1926  TROPONINI 0.07* 0.07* 0.07* 0.07*    BNP: Invalid input(s): POCBNP  CBG:  Recent Labs Lab 12/08/15 0502  GLUCAP 110*    Microbiology: Results for orders placed or performed during the hospital encounter of 12/08/15  MRSA PCR Screening     Status: Abnormal   Collection Time: 12/08/15  4:52 AM  Result Value Ref Range Status   MRSA by PCR POSITIVE (A) NEGATIVE Final    Comment:        The GeneXpert MRSA Assay (FDA approved for NASAL specimens only), is one component of a comprehensive MRSA colonization surveillance program. It is not intended to diagnose MRSA infection nor to guide or monitor treatment for MRSA infections. RESULT CALLED TO, READ BACK BY AND VERIFIED WITH: MARCEL TURNER AT 0617 ON 12/08/15 RWW     Coagulation Studies: No  results for input(s): LABPROT, INR in the last 72 hours.  Urinalysis: No results for input(s): COLORURINE, LABSPEC, PHURINE, GLUCOSEU, HGBUR, BILIRUBINUR, KETONESUR, PROTEINUR, UROBILINOGEN, NITRITE, LEUKOCYTESUR in the last 72 hours.  Invalid input(s): APPERANCEUR    Imaging: No results found.   Medications:    . sodium chloride   Intravenous Once  . acetaminophen  650 mg Oral Once  . amiodarone  200 mg Oral Daily  . amLODipine  5 mg Oral Daily  . calcium acetate  1,334 mg Oral TID WC  . calcium acetate  667 mg Oral With snacks  . Chlorhexidine Gluconate Cloth  6 each Topical Q0600  . cinacalcet  60 mg Oral Daily  . clopidogrel  75 mg Oral Daily  . fluticasone  1 spray Each Nare Daily  . furosemide  80 mg Oral Q T,Th,S,Su  . heparin  5,000 Units Subcutaneous Q8H  . isosorbide mononitrate  30 mg Oral Daily  . metoprolol tartrate  12.5 mg Oral BID  . mometasone-formoterol  2 puff Inhalation BID  . multivitamin  1 tablet Oral QHS  . mupirocin ointment  1 application Nasal BID  . pantoprazole  40 mg Oral BID  . sodium chloride flush  3 mL Intravenous Q12H  . sodium chloride flush  3 mL Intravenous Q12H  . tiotropium  18 mcg Inhalation Daily   sodium chloride, acetaminophen **OR** acetaminophen, albuterol, diphenhydrAMINE, heparin, hydrALAZINE, ipratropium-albuterol, lidocaine-prilocaine, ondansetron **OR** ondansetron (ZOFRAN) IV, senna-docusate, sodium chloride flush  Assessment/ Plan:  [name] is a 60 y.o. black female with hypertension, anemia of chronic kidney disease, ESRD on HD TTHS, secondary hyperparathyroidism, hx of meningitis as a child with resultant hearing loss,  TTS [facility] [place]   1. End Stage Renal Disease:  - Patient seen during dialysis Tolerating well   2. Hypertension: BP elevated.  - restart home regimen of amlodipine, furosemide, metoprolol, imdur  3. Anemia of chronic kidney disease: unable to take IV iron  - epo with  treatment - Blood transfusion to be given during HD this admission  4. Secondary Hyperparathyroidism: with hyperphosphatemia. Outpatient PTH 1558, phosphorus 11.9,  - restarted cinacalcet and calcium acetate.     LOS: 2 Khalil Belote 11/21/20172:02 PM

## 2015-12-10 NOTE — Progress Notes (Signed)
Called Dr. Jannifer Franklin regarding medication for sleep per patient request.  Appropriate orders were placed.  Christene Slates  12/10/2015  2:36 AM

## 2015-12-10 NOTE — Progress Notes (Signed)
Pre dialysis assessment 

## 2015-12-10 NOTE — Progress Notes (Signed)
Post HD assessment  

## 2015-12-10 NOTE — Progress Notes (Signed)
PT Cancellation Note  Patient Details Name: Kelsey Peterson MRN: [id number] DOB: 12/12/1955   Cancelled Treatment:    Reason Eval/Treat Not Completed: Other (comment) (Per RN pt agitated and not appropriate for PT at this time). Will continue to follow acutely.   Collie Siad PT, DPT 12/10/2015, 2:32 PM

## 2015-12-10 NOTE — Discharge Summary (Signed)
[facility] NAME: Kelsey Peterson    MR#:  [id number]  DATE OF BIRTH:  31-Dec-1955  DATE OF ADMISSION:  12/08/2015   ADMITTING PHYSICIAN: Saundra Shelling, MD  DATE OF DISCHARGE: 12/10/2015 PRIMARY CARE PHYSICIAN: No PCP Per Patient   ADMISSION DIAGNOSIS:  Shortness of breath [R06.02] Demand ischemia (HCC) [I24.8] ESRD (end stage renal disease) on dialysis ([place]) [N18.6, Z99.2] Acute on chronic combined systolic and diastolic congestive heart failure (HCC) [I50.43] Chronic bronchitis, unspecified chronic bronchitis type ([place]) [J42] DISCHARGE DIAGNOSIS:  Active Problems:   CHF exacerbation ([place])   Heart failure ([place])  SECONDARY DIAGNOSIS:   Past Medical History:  Diagnosis Date  . Anemia of chronic disease   . CAD (coronary artery disease)   . Chronic systolic CHF (congestive heart failure) (HCC)    a. EF 45% by echo in 06/2014  . Cognitive impairment   . COPD (chronic obstructive pulmonary disease) ([place]) 07/14/2014  . ESRD on hemodialysis ([place])   . GERD (gastroesophageal reflux disease) 07/14/2014  . Hepatitis C   . HOH (hard of hearing)   . HTN (hypertension)   . Migraine with aura   . Mitral regurgitation    a. calcified mitral annulus, mod MR by echo in 06/2014  . Paroxysmal SVT (supraventricular tachycardia) ([place])    a. history of this, with last known occurrence in 06/2014  . Secondary hyperparathyroidism of renal origin [facility])    HOSPITAL COURSE:   60 y/o F with PMH of anemia, ESRD on TThS dialysis, COPD, combined CHF at EF of 45%, hepatitis C presents to the hospital secondary to worsening shortness of breath.  #1 acute on chronic systolic CHF exacerbation-missed her dialysis. - On Lasix in the hospital orally. Oliguric due to her renal disease. -Continue oxygen support. -Patient refusing telemetry box. She got HD today. Her symptoms is much improved.  #2 end-stage renal disease on hemodialysis-on Tuesday  Thursday Saturday schedule. Got dialysis today.  * Hyperkalemia. Improving based HD.  #3 hypertension-on Lasix, Norvasc, metoprolol and Imdur. -IV hydralazine as needed. Patient is noncompliant and refused medications after HD today. She was given iv hydralazine, but she refused to repeat BP and went home.  #4 COPD-stable. Continued dulera and Spiriva. On nebs when necessary  #5 GERD-on PPI  #6  Symptomatic Anemia of chronic disease- hb at 7.5, baseline, down to 6.8. 1 unit PRBC transfusion during HD yesterday. Hemoglobin is up to 7.5.  Discussed is Dr. Candiss Norse, nephrologist, who agrees to discharge home today. DISCHARGE CONDITIONS:  Stable, discharged to home today. CONSULTS OBTAINED:  Treatment Team:  Lavonia Dana, MD DRUG ALLERGIES:   Allergies  Allergen Reactions  . Contrast Media [Iodinated Diagnostic Agents] Other (See Comments)    Unknown reaction  . Morphine And Related Hives  . Other Rash    Blood Pressure Pill (Pt doesn't remember name)    DISCHARGE MEDICATIONS:     Medication List    TAKE these medications   albuterol 108 (90 Base) MCG/ACT inhaler Commonly known as:  PROVENTIL HFA;VENTOLIN HFA Inhale 2 puffs into the lungs every 6 (six) hours as needed for wheezing or shortness of breath.   amiodarone 200 MG tablet Commonly known as:  PACERONE Take 1 tablet (200 mg total) by mouth daily.   amLODipine 5 MG tablet Commonly known as:  NORVASC Take 5 mg by mouth daily.   calcium acetate 667 MG capsule Commonly known as:  PHOSLO Take [phone] capsules by mouth  5 (five) times daily. 1334 mg 3 times daily with meals and 667 mg 2 times daily with snacks   cinacalcet 60 MG tablet Commonly known as:  SENSIPAR Take 60 mg by mouth daily.   clopidogrel 75 MG tablet Commonly known as:  PLAVIX Take 75 mg by mouth daily.   Fluticasone-Salmeterol 250-50 MCG/DOSE Aepb Commonly known as:  ADVAIR DISKUS Inhale 1 puff into the lungs 2 (two) times daily.     folic acid-vitamin b complex-vitamin c-selenium-zinc 3 MG Tabs tablet Take 1 tablet by mouth daily.   furosemide 80 MG tablet Commonly known as:  LASIX Take 80 mg by mouth every Tuesday, Thursday, Saturday, and Sunday.   ipratropium-albuterol 0.5-2.5 (3) MG/3ML Soln Commonly known as:  DUONEB Take 3 mLs by nebulization every 4 (four) hours as needed. What changed:  additional instructions   isosorbide mononitrate 30 MG 24 hr tablet Commonly known as:  IMDUR Take 30 mg by mouth daily.   metoprolol tartrate 25 MG tablet Commonly known as:  LOPRESSOR Take 12.5 mg by mouth 2 (two) times daily.   ondansetron 4 MG tablet Commonly known as:  ZOFRAN Take 4 mg by mouth every 8 (eight) hours as needed for nausea or vomiting. Reported on 02/05/2015   pantoprazole 40 MG tablet Commonly known as:  PROTONIX Take 1 tablet (40 mg total) by mouth 2 (two) times daily.   tiotropium 18 MCG inhalation capsule Commonly known as:  SPIRIVA Place 18 mcg into inhaler and inhale daily.        DISCHARGE INSTRUCTIONS:  See AVS.  If you experience worsening of your admission symptoms, develop shortness of breath, life threatening emergency, suicidal or homicidal thoughts you must seek medical attention immediately by calling 911 or calling your MD immediately  if symptoms less severe.  You Must read complete instructions/literature along with all the possible adverse reactions/side effects for all the Medicines you take and that have been prescribed to you. Take any new Medicines after you have completely understood and accpet all the possible adverse reactions/side effects.   Please note  You were cared for by a hospitalist during your hospital stay. If you have any questions about your discharge medications or the care you received while you were in the hospital after you are discharged, you can call the unit and asked to speak with the hospitalist on call if the hospitalist that took care of you  is not available. Once you are discharged, your primary care physician will handle any further medical issues. Please note that NO REFILLS for any discharge medications will be authorized once you are discharged, as it is imperative that you return to your primary care physician (or establish a relationship with a primary care physician if you do not have one) for your aftercare needs so that they can reassess your need for medications and monitor your lab values.    On the day of Discharge:  VITAL SIGNS:  Blood pressure (!) 183/91, pulse 74, temperature 98.3 F (36.8 C), temperature source Oral, resp. rate (!) 24, height 5\' 2"  (1.575 m), weight 160 lb 15 oz (73 kg), SpO2 98 %. PHYSICAL EXAMINATION:  GENERAL:  60 y.o.-year-old patient lying in the bed with no acute distress.  EYES: Pupils equal, round, reactive to light and accommodation. No scleral icterus. Extraocular muscles intact.  HEENT: Head atraumatic, normocephalic. Oropharynx and nasopharynx clear.  NECK:  Supple, no jugular venous distention. No thyroid enlargement, no tenderness.  LUNGS: Normal breath sounds bilaterally, no wheezing,  rales,rhonchi or crepitation. No use of accessory muscles of respiration.  CARDIOVASCULAR: S1, S2 normal. No murmurs, rubs, or gallops.  ABDOMEN: Soft, non-tender, non-distended. Bowel sounds present. No organomegaly or mass.  EXTREMITIES: No pedal edema, cyanosis, or clubbing.  NEUROLOGIC: Cranial nerves II through XII are intact. Muscle strength 5/5 in all extremities. Sensation intact. Gait not checked.  PSYCHIATRIC: The patient is alert and oriented x 3.  SKIN: No obvious rash, lesion, or ulcer.  DATA REVIEW:   CBC  Recent Labs Lab 12/10/15 0513  WBC 7.7  HGB 7.5*  HCT 22.9*  PLT 245    Chemistries   Recent Labs Lab 12/08/15 0117  12/10/15 0513  NA 137  < > 136  K 5.3*  < > 4.7  CL 96*  < > 98*  CO2 27  < > 29  GLUCOSE 95  < > 93  BUN 63*  < > 39*  CREATININE 10.80*  < > 7.54*   CALCIUM 9.2  < > 8.5*  AST 22  --   --   ALT 15  --   --   ALKPHOS 102  --   --   BILITOT 0.7  --   --   < > = values in this interval not displayed.   Microbiology Results  Results for orders placed or performed during the hospital encounter of 12/08/15  MRSA PCR Screening     Status: Abnormal   Collection Time: 12/08/15  4:52 AM  Result Value Ref Range Status   MRSA by PCR POSITIVE (A) NEGATIVE Final    Comment:        The GeneXpert MRSA Assay (FDA approved for NASAL specimens only), is one component of a comprehensive MRSA colonization surveillance program. It is not intended to diagnose MRSA infection nor to guide or monitor treatment for MRSA infections. RESULT CALLED TO, READ BACK BY AND VERIFIED WITH: MARCEL TURNER AT 0617 ON 12/08/15 RWW     RADIOLOGY:  No results found.   Management plans discussed with the patient, family and they are in agreement.  CODE STATUS:     Code Status Orders        Start     Ordered   12/08/15 0452  Full code  Continuous     12/08/15 0451    Code Status History    Date Active Date Inactive Code Status Order ID Comments User Context   10/07/2015  7:21 PM 10/10/2015  7:20 PM Full Code [id number]  Hillary Bow, MD ED   07/29/2015  6:11 PM 07/31/2015  8:49 PM Full Code [id number]  Lytle Butte, MD ED   04/18/2015 11:33 PM 04/23/2015  8:26 PM Full Code [id number]  Sylvan Cheese, MD Inpatient   04/02/2015  3:21 AM 04/04/2015  8:25 PM Full Code [id number]  Lance Coon, MD Inpatient   02/05/2015  9:17 AM 02/08/2015  8:00 PM Full Code [id number]  Fritzi Mandes, MD Inpatient   12/01/2014  3:08 PM 12/07/2014  7:15 PM Full Code [id number]  Demetrios Loll, MD Inpatient   09/01/2014  5:57 PM 09/06/2014  2:09 PM Full Code [id number]  Demetrios Loll, MD ED   07/14/2014  5:17 AM 07/16/2014  6:40 PM Full Code [id number]  Lance Coon, MD Inpatient   07/03/2014  2:06 PM 07/10/2014  7:11 PM Full Code [id number]  Demetrios Loll, MD Inpatient   06/21/2014  1:35 PM 06/22/2014  4:33 PM  Full Code [id number]  Max Sane, MD Inpatient  TOTAL TIME TAKING CARE OF THIS PATIENT: 36 minutes.    Demetrios Loll M.D on 12/10/2015 at 4:57 PM  Between 7am to 6pm - Pager - [phone]  After 6pm go to [email or web address] - [employer] [facility]  Office  [phone]  CC: Primary care physician; No PCP Per Patient   Note: This dictation was prepared with Dragon dictation along with smaller phrase technology. Any transcriptional errors that result from this process are unintentional.

## 2015-12-10 NOTE — Progress Notes (Signed)
PT Cancellation Note  Patient Details Name: MUBINA PRONOVOST MRN: [id number] DOB: Sep 09, 1955   Cancelled Treatment:    Reason Eval/Treat Not Completed: Patient at procedure or test/unavailable.  Pt currently off floor at dialysis per nursing.  Will re-attempt PT eval at a later date/time.   [name] Anetta Olvera 12/10/2015, 10:36 AM Leitha Bleak, [place]

## 2015-12-10 NOTE — Discharge Instructions (Signed)
Renal diet. Compliance to medication.

## 2015-12-10 NOTE — Progress Notes (Signed)
HD Completed without issue. 1.5L goal met. Pt tolerated well. Report called to primary RN.

## 2015-12-10 NOTE — Progress Notes (Signed)
Pre dialysis  

## 2015-12-11 NOTE — Progress Notes (Signed)
12/10/2015 13:30  Pt retuned from hemodialysis hypotensive. Gave anti-hypertensive meds previously held per predialysis protocol. Explained to pt that I needed to wait and recheck BP.  Patient became agitated, saying that her ide home was here and she would be leaving now no matter what.  Spoke with attending MD Bridgett Larsson that she was going to leave ight away and she would not allow me to wait and recheck BP. Dola Argyle, RN

## 2015-12-17 ENCOUNTER — Telehealth (INDEPENDENT_AMBULATORY_CARE_PROVIDER_SITE_OTHER): Payer: Self-pay

## 2015-12-18 ENCOUNTER — Other Ambulatory Visit: Payer: Self-pay | Admitting: *Deleted

## 2015-12-18 DIAGNOSIS — Z01810 Encounter for preprocedural cardiovascular examination: Secondary | ICD-10-CM

## 2015-12-18 DIAGNOSIS — N186 End stage renal disease: Secondary | ICD-10-CM

## 2015-12-20 DEATH — deceased

## 2015-12-25 ENCOUNTER — Encounter: Payer: Self-pay | Admitting: Surgery

## 2016-01-03 ENCOUNTER — Encounter: Payer: Medicare Other | Admitting: Surgery

## 2016-01-03 ENCOUNTER — Encounter (HOSPITAL_COMMUNITY): Payer: Medicare Other

## 2016-01-03 ENCOUNTER — Other Ambulatory Visit (HOSPITAL_COMMUNITY): Payer: Medicare Other

## 2017-05-29 IMAGING — CR DG LUMBAR SPINE 2-3V
3 series · 3 of 3 positions shown · non-contrast
Comparison: 01/18/2014 abdominal CT

CLINICAL DATA: 59-year-old female with acute lumbar spine pain for
1 day.

EXAM:
LUMBAR SPINE - 2-3 VIEW

[l-spine ap]
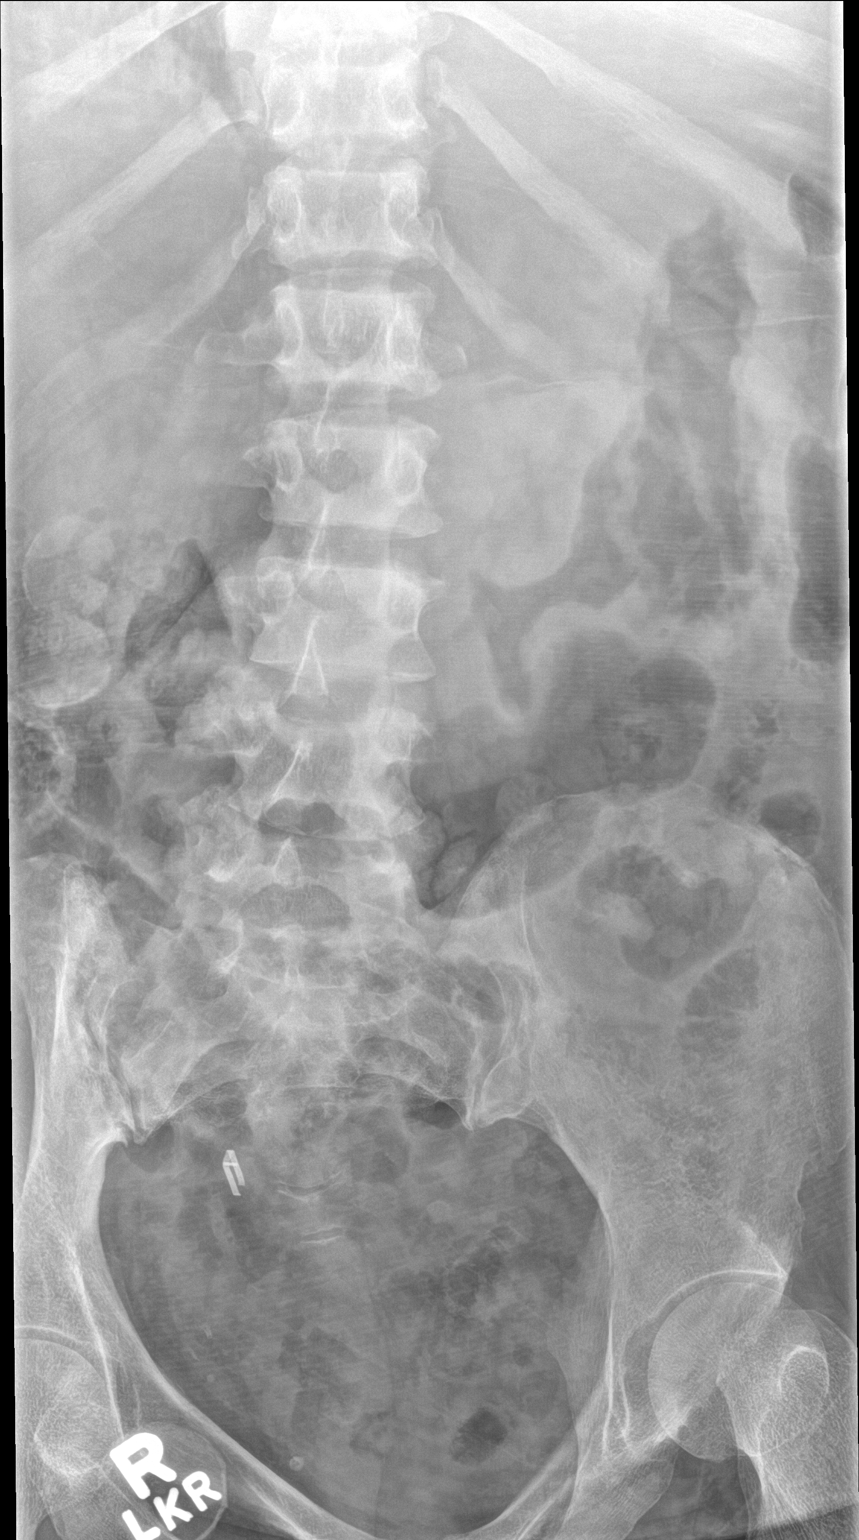

[l-spine lat]
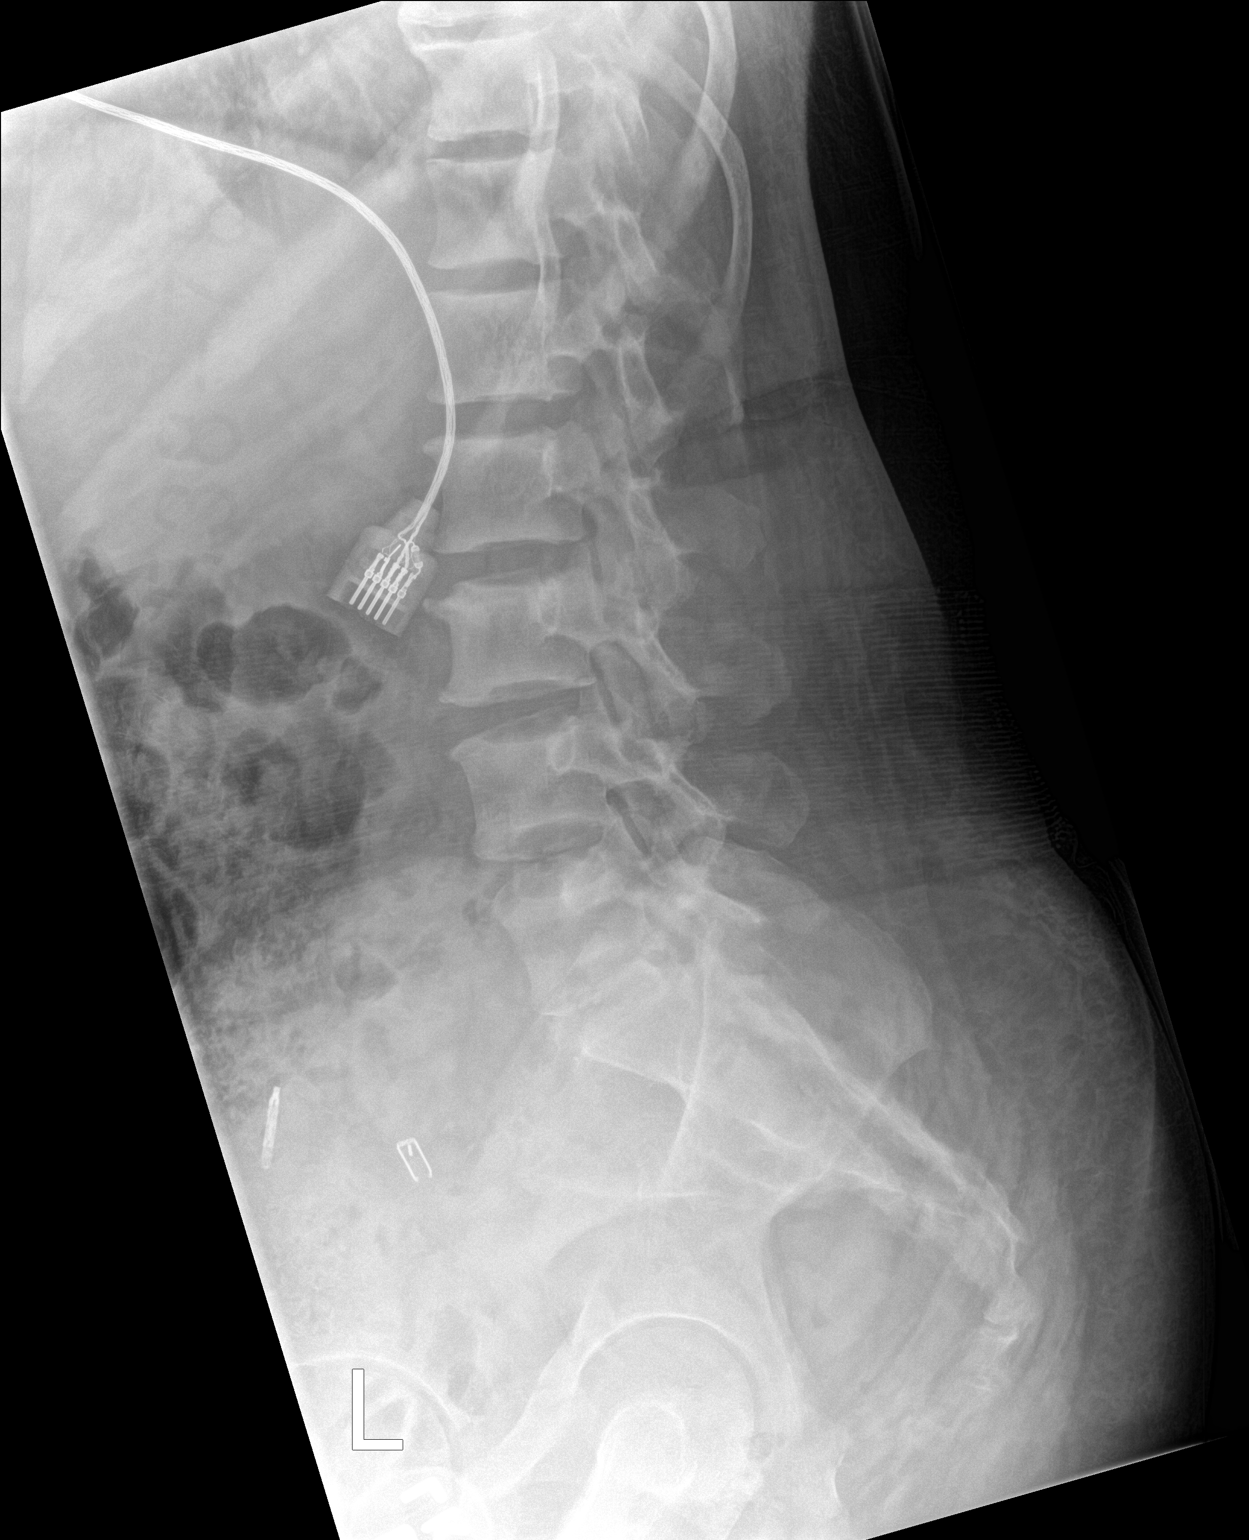

[l-spine spot]
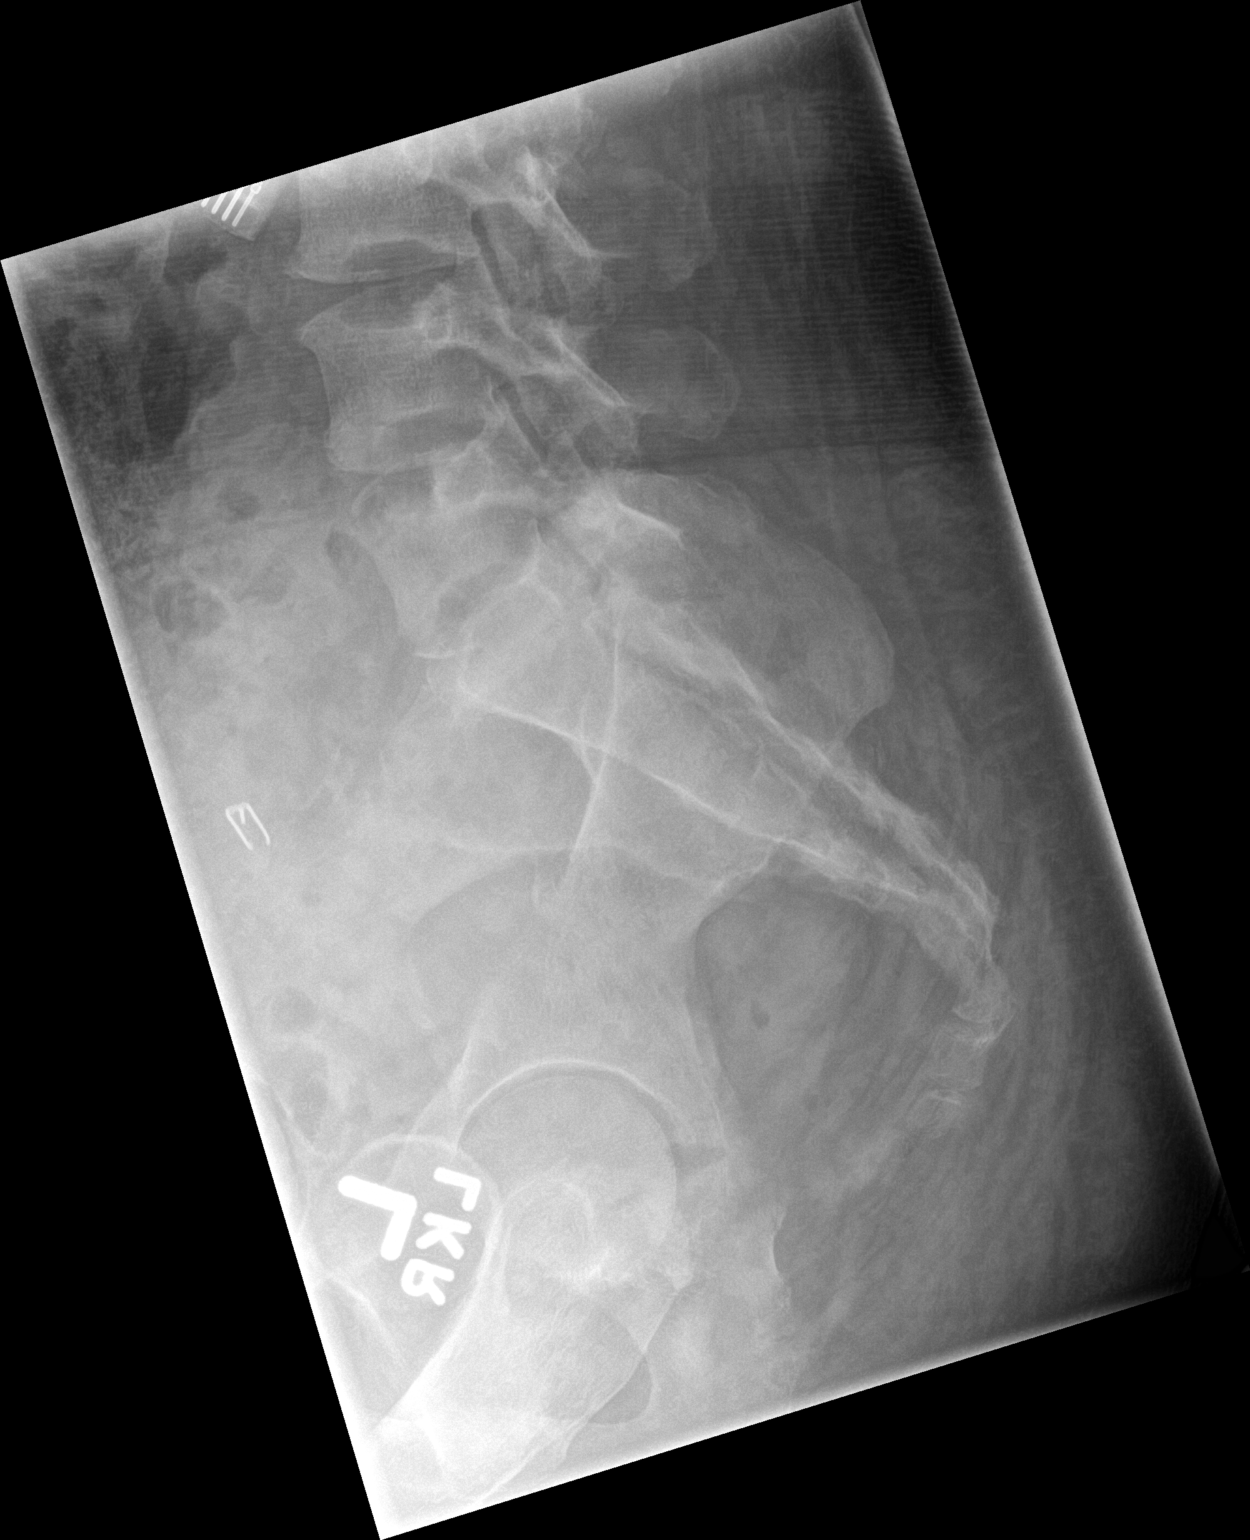

[3 of 3 positions shown; findings below may reference images not displayed]

FINDINGS: Five non rib-bearing lumbar type vertebra are identified.

There is no evidence of acute fracture or subluxation.

Mild multilevel degenerative disc disease noted.

No focal bony lesions are identified.
IMPRESSION: No evidence of acute abnormality.

Mild multilevel degenerative disc disease.

## 2017-11-12 IMAGING — DX DG CHEST 1V PORT
1 series · 1 of 1 positions shown · non-contrast
Comparison: 04/04/2015

CLINICAL DATA: Coffee ground emesis

EXAM:
PORTABLE CHEST 1 VIEW

[chest ap]
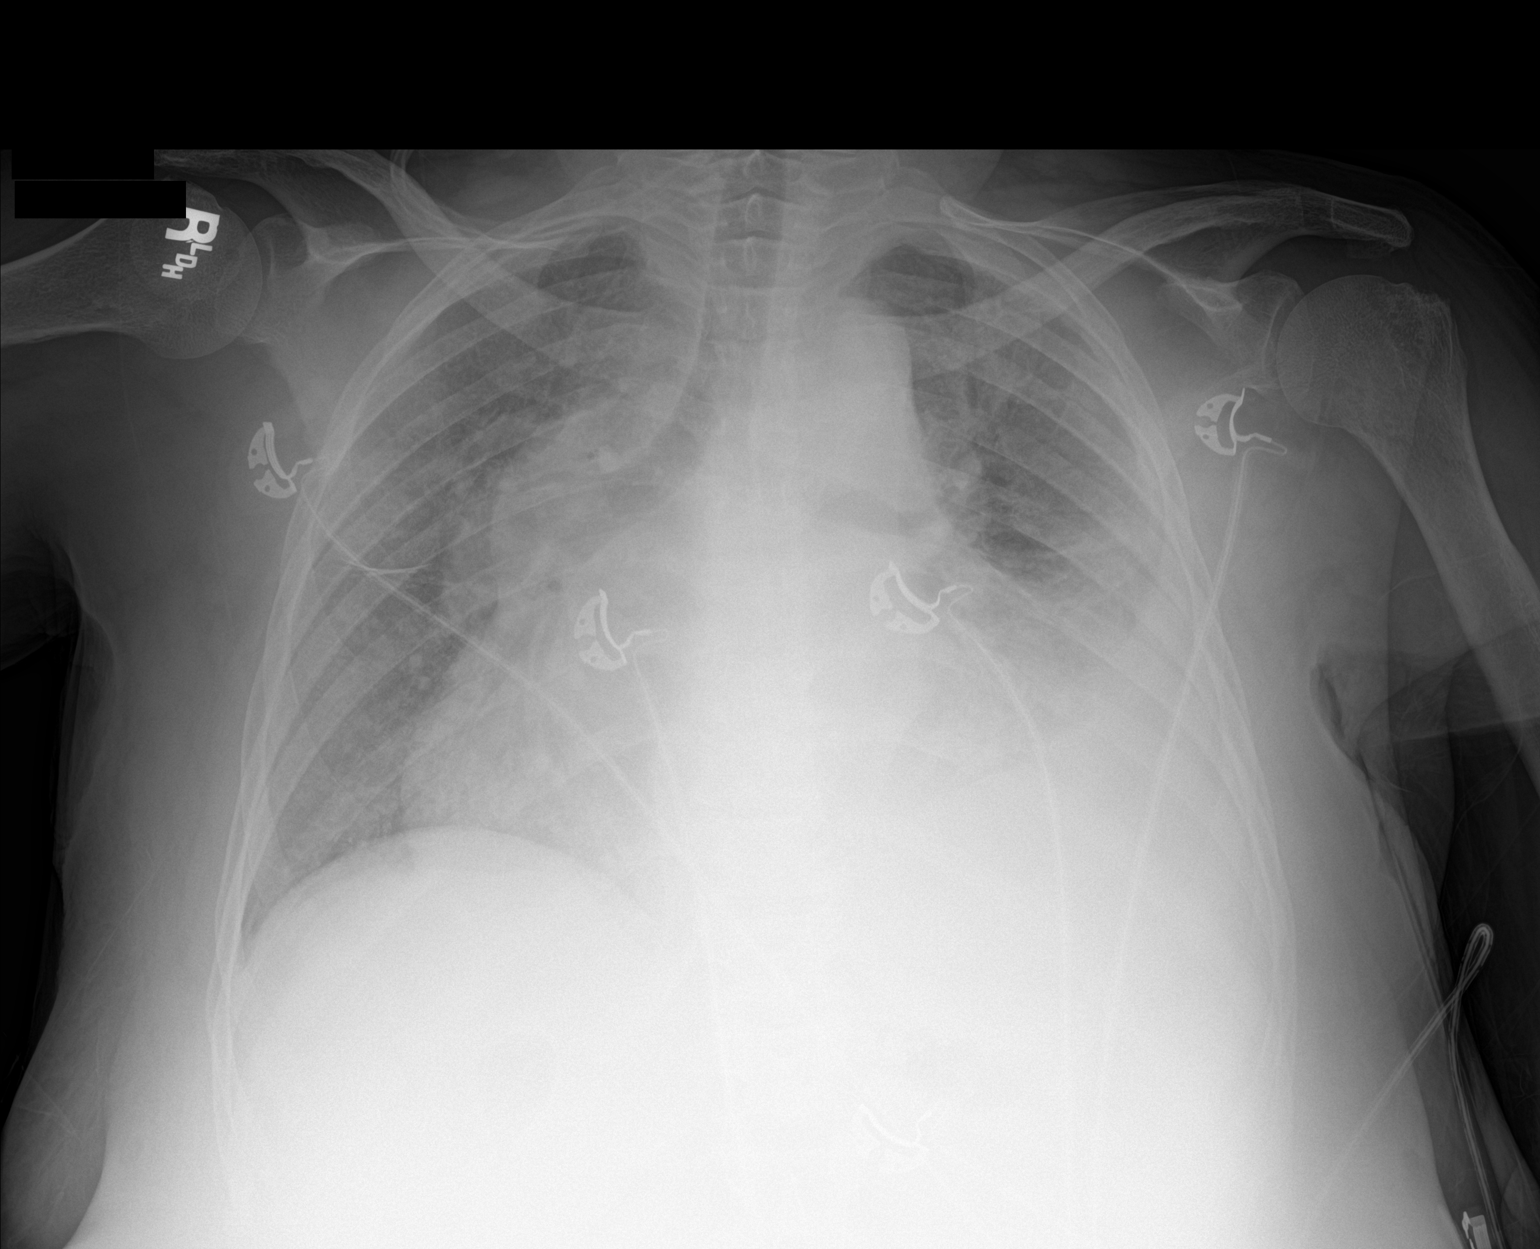

[1 of 1 positions shown; findings below may reference images not displayed]

FINDINGS: Cardiac shadow remains enlarged. A moderate-sized left-sided pleural
effusion is again identified and stable. Underlying infiltrate is
likely present. Mild vascular congestion remains. No new focal
infiltrate is seen.
IMPRESSION: Cardiomegaly and vascular congestion consistent with CHF.

Stable left basilar infiltrate and effusion.

## 2017-11-16 IMAGING — CT CT ABD-PEL WO/W CM
4 of 13 series · 12 of 46 positions shown, 18 images · IV contrast (iopamidol)
Comparison: 01/18/2014.

CLINICAL DATA: Generalized abdominal pain and vomiting, coffee
ground emesis.

EXAM:
CT ABDOMEN AND PELVIS WITHOUT AND WITH CONTRAST
TECHNIQUE: Multidetector CT imaging of the abdomen and pelvis was performed
following the standard protocol before and following the bolus
administration of intravenous contrast.
CONTRAST:  100mL GMFLXD-6YY IOPAMIDOL (GMFLXD-6YY) INJECTION 61%

[Series 2: pancreas without · axial · non-contrast · 0.78mm/px · z∈[-710,-430]mm · 4 of 94 slices shown]
[im 19/94  soft-tissue]
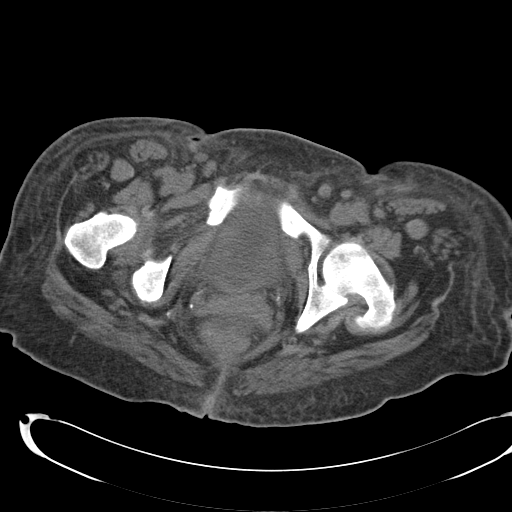
[im 38/94  soft-tissue]
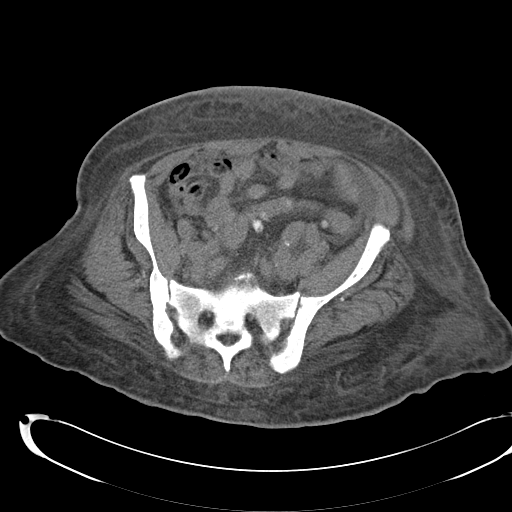
[im 56/94  soft-tissue]
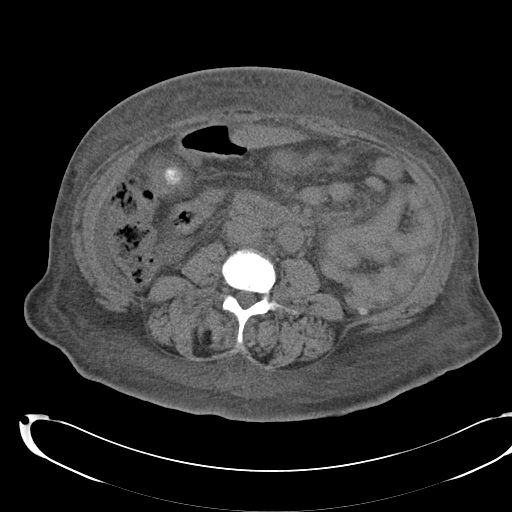
[im 75/94  soft-tissue]
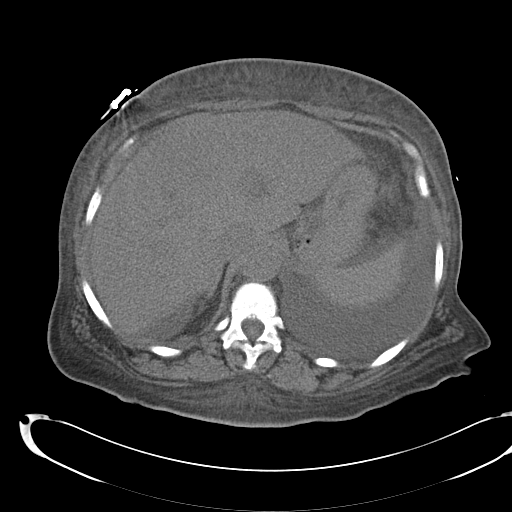

[Series 3: pancreas arterial · axial · arterial · 0.78mm/px · z∈[-564,-532]mm · 2 of 131 slices shown]
[im 17/131  soft-tissue]
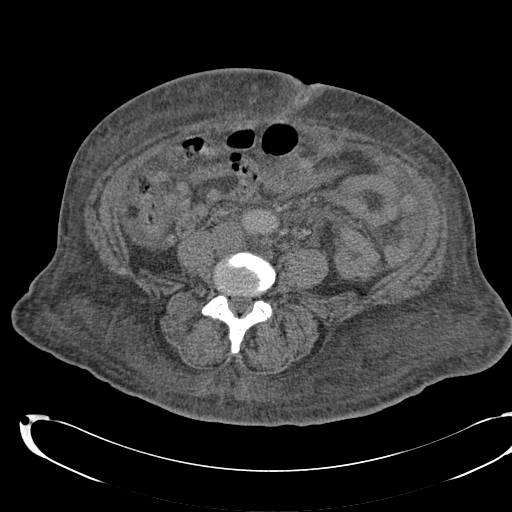
[im 33/131  soft-tissue]
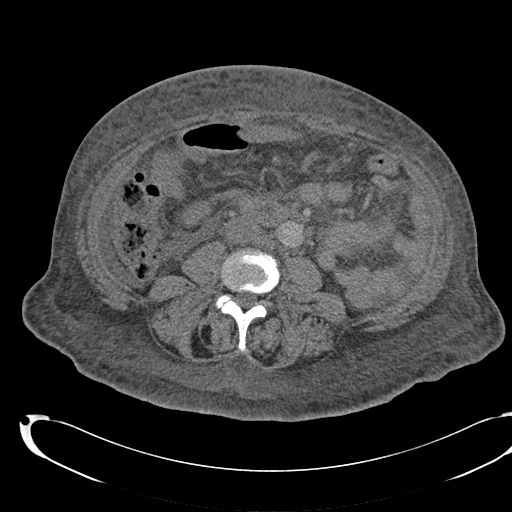

[Series 5: pancreas portal venous · axial · portal-venous · 0.78mm/px · z∈[-700,-430]mm · 4 of 92 slices shown, 9 images]
[im 19/92  soft-tissue]
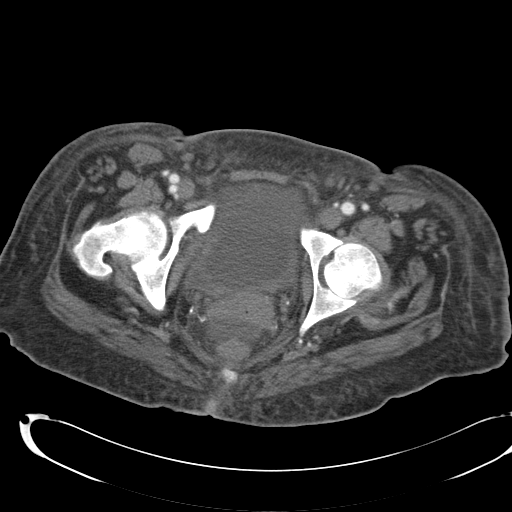
[im 19/92  lung]
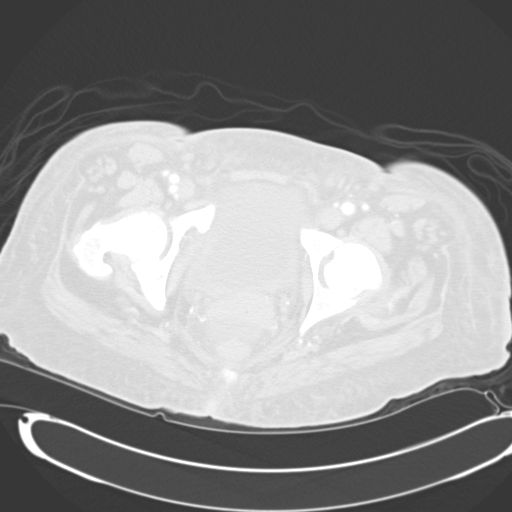
[im 19/92  bone]
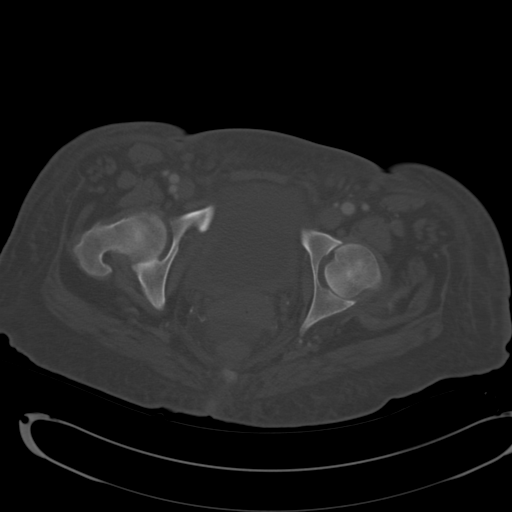
[im 37/92  soft-tissue]
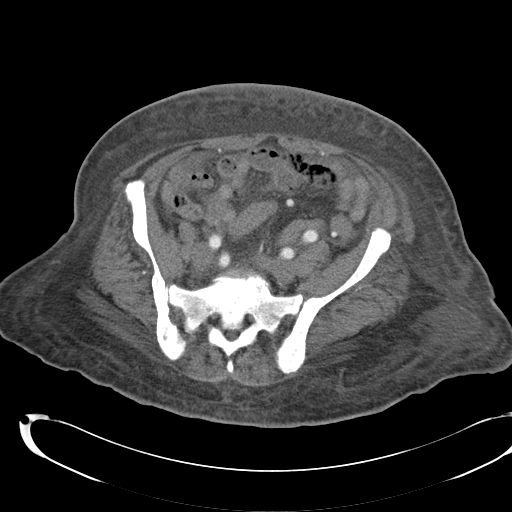
[im 37/92  lung]
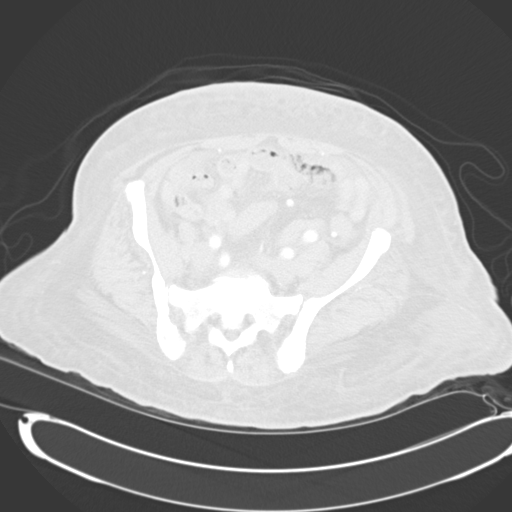
[im 55/92  soft-tissue]
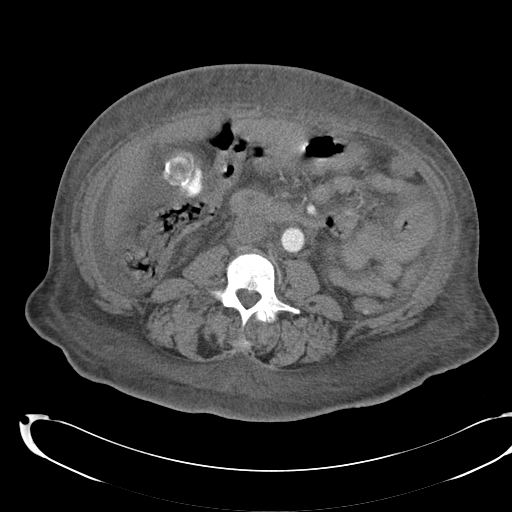
[im 55/92  lung]
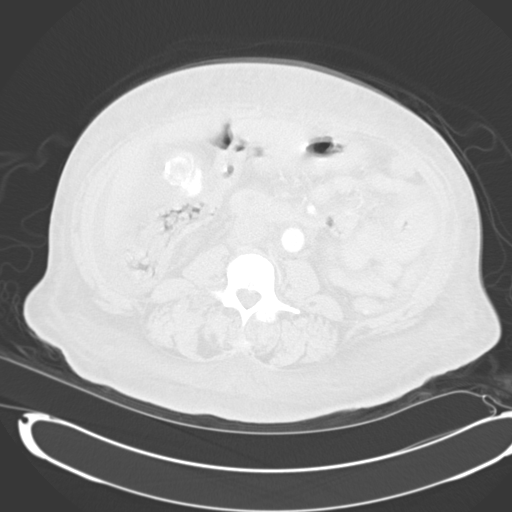
[im 73/92  soft-tissue]
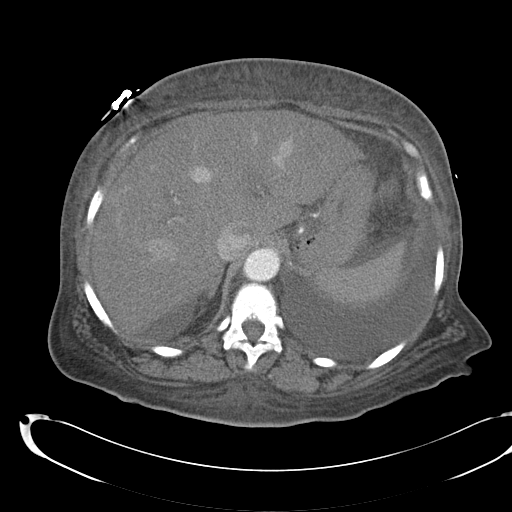
[im 73/92  lung]
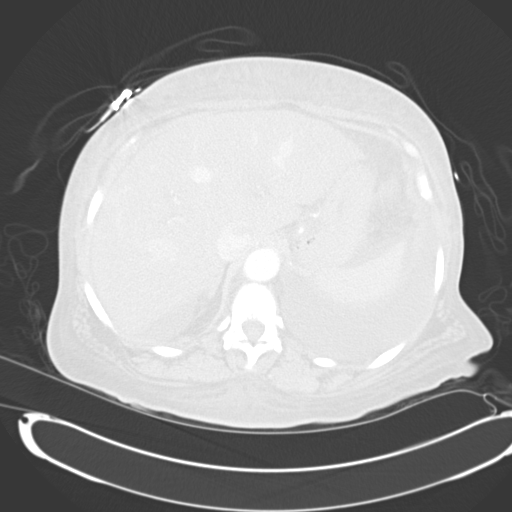

[Series 10: pancreas arterial cor · coronal · arterial · 0.54mm/px · 2 of 142 slices shown, 3 images]
[im 48/142  soft-tissue]
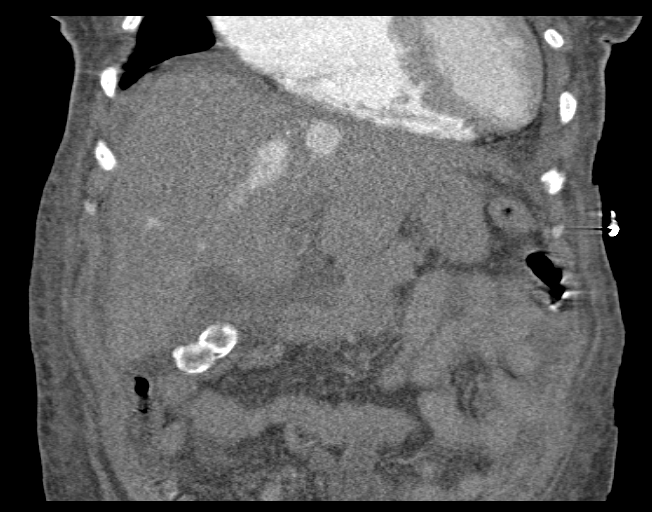
[im 48/142  bone]
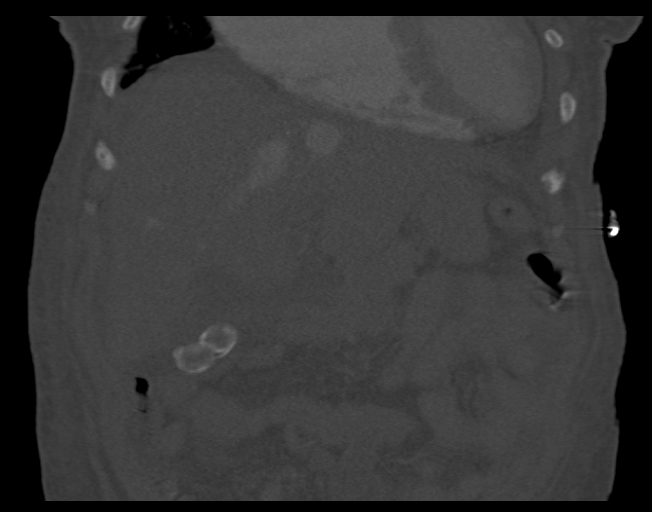
[im 95/142  soft-tissue]
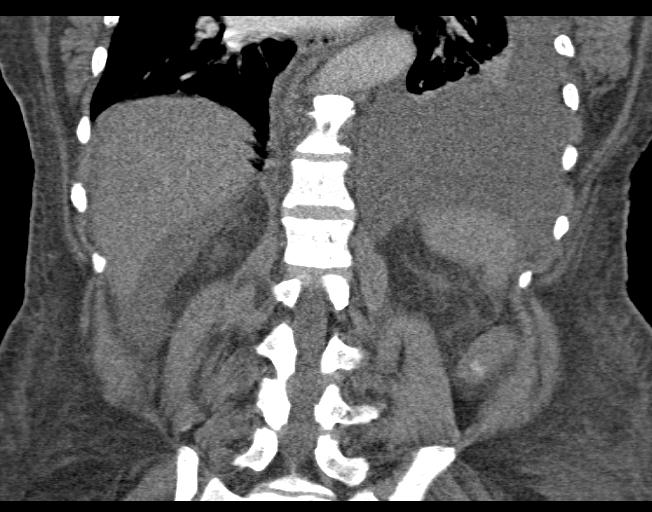

[12 of 46 positions shown; findings below may reference images not displayed]

FINDINGS: Lower chest: Lung bases show a loculated moderate left pleural
effusion. Small right pleural effusion. Added density in the lung
bases may be due to expiratory phase imaging. Minimal compressive
atelectasis in the left lower lobe. Heart is enlarged. No
pericardial effusion.

Hepatobiliary: Liver is decreased in attenuation diffusely. Large
stones are seen in the gallbladder. No definite biliary ductal
dilatation.

Pancreas: Negative.

Spleen: Negative.

Adrenals/Urinary Tract: Adrenal glands are unremarkable. Kidneys are
atrophic. Low-attenuation lesions in the native kidneys measure up
to 13 mm on the right, difficult to characterize due to size. There
may be a tiny cystocele. Bladder is otherwise grossly unremarkable.

Stomach/Bowel: Tiny hiatal hernia. Stomach, small bowel, appendix
and colon are unremarkable.

Vascular/Lymphatic: Possible esophageal varices. No pathologically
enlarged lymph nodes.

Reproductive: Uterus and ovaries are visualized.

Other: Small ascites.  Diffuse body wall edema.

Musculoskeletal: Dense osseous structures, indicative of chronic
renal disease. No worrisome lytic or sclerotic lesions. Degenerative
changes are seen in the spine.
IMPRESSION: 1. No evidence of a pancreatic lesion.
2. Hepatic steatosis.
3. Ascites and bilateral pleural effusions, left greater than right.
4. Cholelithiasis.
